# Patient Record
Sex: Female | Born: 1940
Health system: Southern US, Community
[De-identification: ages and names within clinical notes are randomized; demographics above are authoritative.]

## PROBLEM LIST (undated history)

## (undated) DIAGNOSIS — R42 Dizziness and giddiness: Secondary | ICD-10-CM

## (undated) DIAGNOSIS — K5900 Constipation, unspecified: Secondary | ICD-10-CM

## (undated) DIAGNOSIS — F319 Bipolar disorder, unspecified: Secondary | ICD-10-CM

## (undated) DIAGNOSIS — Z90710 Acquired absence of both cervix and uterus: Secondary | ICD-10-CM

## (undated) DIAGNOSIS — M8580 Other specified disorders of bone density and structure, unspecified site: Secondary | ICD-10-CM

## (undated) DIAGNOSIS — E785 Hyperlipidemia, unspecified: Secondary | ICD-10-CM

## (undated) DIAGNOSIS — T8859XA Other complications of anesthesia, initial encounter: Secondary | ICD-10-CM

## (undated) DIAGNOSIS — M5430 Sciatica, unspecified side: Secondary | ICD-10-CM

## (undated) DIAGNOSIS — F419 Anxiety disorder, unspecified: Secondary | ICD-10-CM

## (undated) DIAGNOSIS — D649 Anemia, unspecified: Secondary | ICD-10-CM

## (undated) DIAGNOSIS — T4145XA Adverse effect of unspecified anesthetic, initial encounter: Secondary | ICD-10-CM

## (undated) DIAGNOSIS — G8929 Other chronic pain: Secondary | ICD-10-CM

## (undated) DIAGNOSIS — K219 Gastro-esophageal reflux disease without esophagitis: Secondary | ICD-10-CM

## (undated) DIAGNOSIS — R5382 Chronic fatigue, unspecified: Secondary | ICD-10-CM

## (undated) DIAGNOSIS — I2510 Atherosclerotic heart disease of native coronary artery without angina pectoris: Secondary | ICD-10-CM

## (undated) DIAGNOSIS — F32A Depression, unspecified: Secondary | ICD-10-CM

## (undated) DIAGNOSIS — M549 Dorsalgia, unspecified: Secondary | ICD-10-CM

## (undated) DIAGNOSIS — Z86010 Personal history of colonic polyps: Secondary | ICD-10-CM

## (undated) DIAGNOSIS — I10 Essential (primary) hypertension: Secondary | ICD-10-CM

## (undated) DIAGNOSIS — Z87442 Personal history of urinary calculi: Secondary | ICD-10-CM

## (undated) DIAGNOSIS — Z9889 Other specified postprocedural states: Secondary | ICD-10-CM

## (undated) DIAGNOSIS — R112 Nausea with vomiting, unspecified: Secondary | ICD-10-CM

## (undated) DIAGNOSIS — F329 Major depressive disorder, single episode, unspecified: Secondary | ICD-10-CM

## (undated) DIAGNOSIS — M1990 Unspecified osteoarthritis, unspecified site: Secondary | ICD-10-CM

## (undated) DIAGNOSIS — E039 Hypothyroidism, unspecified: Secondary | ICD-10-CM

## (undated) DIAGNOSIS — H269 Unspecified cataract: Secondary | ICD-10-CM

## (undated) DIAGNOSIS — M797 Fibromyalgia: Secondary | ICD-10-CM

## (undated) DIAGNOSIS — M25569 Pain in unspecified knee: Secondary | ICD-10-CM

## (undated) HISTORY — DX: Fibromyalgia: M79.7

## (undated) HISTORY — DX: Bipolar disorder, unspecified: F31.9

## (undated) HISTORY — DX: Acquired absence of both cervix and uterus: Z90.710

## (undated) HISTORY — DX: Other specified disorders of bone density and structure, unspecified site: M85.80

## (undated) HISTORY — DX: Hyperlipidemia, unspecified: E78.5

## (undated) HISTORY — PX: BREAST LUMPECTOMY: SHX2

## (undated) HISTORY — DX: Dizziness and giddiness: R42

## (undated) HISTORY — DX: Essential (primary) hypertension: I10

## (undated) HISTORY — DX: Atherosclerotic heart disease of native coronary artery without angina pectoris: I25.10

## (undated) HISTORY — DX: Personal history of colonic polyps: Z86.010

## (undated) HISTORY — DX: Gastro-esophageal reflux disease without esophagitis: K21.9

## (undated) HISTORY — PX: EYE SURGERY: SHX253

## (undated) HISTORY — PX: JOINT REPLACEMENT: SHX530

## (undated) HISTORY — PX: BREAST SURGERY: SHX581

## (undated) HISTORY — DX: Chronic fatigue, unspecified: R53.82

## (undated) HISTORY — DX: Unspecified osteoarthritis, unspecified site: M19.90

## (undated) HISTORY — PX: SHOULDER ARTHROSCOPY: SHX128

## (undated) HISTORY — PX: SPINAL FUSION: SHX223

## (undated) HISTORY — DX: Unspecified cataract: H26.9

## (undated) HISTORY — PX: REVISION TOTAL HIP ARTHROPLASTY: SHX766

## (undated) HISTORY — PX: OTHER SURGICAL HISTORY: SHX169

## (undated) HISTORY — PX: SPINE SURGERY: SHX786

## (undated) HISTORY — PX: ESOPHAGOGASTRODUODENOSCOPY: SHX1529

## (undated) HISTORY — PX: CYSTECTOMY: SUR359

---
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
## 1987-11-13 HISTORY — PX: BREAST BIOPSY: SHX20

## 1992-11-12 HISTORY — PX: ABDOMINAL HYSTERECTOMY: SHX81

## 1998-03-04 ENCOUNTER — Other Ambulatory Visit: Admission: RE | Admit: 1998-03-04 | Discharge: 1998-03-04 | Payer: Self-pay | Admitting: Obstetrics and Gynecology

## 1999-06-07 ENCOUNTER — Other Ambulatory Visit: Admission: RE | Admit: 1999-06-07 | Discharge: 1999-06-07 | Payer: Self-pay | Admitting: Obstetrics and Gynecology

## 2000-06-12 ENCOUNTER — Other Ambulatory Visit: Admission: RE | Admit: 2000-06-12 | Discharge: 2000-06-12 | Payer: Self-pay | Admitting: Obstetrics and Gynecology

## 2001-06-12 ENCOUNTER — Other Ambulatory Visit: Admission: RE | Admit: 2001-06-12 | Discharge: 2001-06-12 | Payer: Self-pay | Admitting: Obstetrics and Gynecology

## 2001-07-22 ENCOUNTER — Encounter (HOSPITAL_COMMUNITY): Admission: RE | Admit: 2001-07-22 | Discharge: 2001-08-21 | Payer: Self-pay | Admitting: Pulmonary Disease

## 2001-08-22 ENCOUNTER — Encounter (HOSPITAL_COMMUNITY): Admission: RE | Admit: 2001-08-22 | Discharge: 2001-09-21 | Payer: Self-pay | Admitting: Pulmonary Disease

## 2002-05-04 ENCOUNTER — Encounter: Payer: Self-pay | Admitting: *Deleted

## 2002-05-04 ENCOUNTER — Emergency Department (HOSPITAL_COMMUNITY): Admission: EM | Admit: 2002-05-04 | Discharge: 2002-05-04 | Payer: Self-pay | Admitting: *Deleted

## 2002-05-08 ENCOUNTER — Ambulatory Visit (HOSPITAL_COMMUNITY): Admission: RE | Admit: 2002-05-08 | Discharge: 2002-05-08 | Payer: Self-pay | Admitting: Cardiology

## 2002-05-12 ENCOUNTER — Ambulatory Visit (HOSPITAL_COMMUNITY): Admission: RE | Admit: 2002-05-12 | Discharge: 2002-05-12 | Payer: Self-pay | Admitting: Pulmonary Disease

## 2002-07-06 ENCOUNTER — Other Ambulatory Visit: Admission: RE | Admit: 2002-07-06 | Discharge: 2002-07-06 | Payer: Self-pay | Admitting: Obstetrics and Gynecology

## 2002-09-05 ENCOUNTER — Encounter: Payer: Self-pay | Admitting: Neurosurgery

## 2002-09-05 ENCOUNTER — Ambulatory Visit (HOSPITAL_COMMUNITY): Admission: RE | Admit: 2002-09-05 | Discharge: 2002-09-05 | Payer: Self-pay | Admitting: Neurosurgery

## 2002-11-12 HISTORY — PX: SPINAL FUSION: SHX223

## 2003-06-01 ENCOUNTER — Inpatient Hospital Stay (HOSPITAL_COMMUNITY): Admission: EM | Admit: 2003-06-01 | Discharge: 2003-06-03 | Payer: Self-pay | Admitting: Emergency Medicine

## 2003-06-01 ENCOUNTER — Encounter (HOSPITAL_COMMUNITY): Admission: RE | Admit: 2003-06-01 | Discharge: 2003-07-01 | Payer: Self-pay | Admitting: *Deleted

## 2003-06-01 ENCOUNTER — Encounter: Payer: Self-pay | Admitting: *Deleted

## 2003-06-13 ENCOUNTER — Inpatient Hospital Stay (HOSPITAL_COMMUNITY): Admission: EM | Admit: 2003-06-13 | Discharge: 2003-06-14 | Payer: Self-pay | Admitting: *Deleted

## 2003-06-13 ENCOUNTER — Encounter: Payer: Self-pay | Admitting: Emergency Medicine

## 2003-06-23 ENCOUNTER — Ambulatory Visit (HOSPITAL_COMMUNITY): Admission: RE | Admit: 2003-06-23 | Discharge: 2003-06-23 | Payer: Self-pay | Admitting: Internal Medicine

## 2003-08-16 ENCOUNTER — Encounter: Payer: Self-pay | Admitting: Internal Medicine

## 2003-08-16 ENCOUNTER — Ambulatory Visit (HOSPITAL_COMMUNITY): Admission: RE | Admit: 2003-08-16 | Discharge: 2003-08-16 | Payer: Self-pay | Admitting: Internal Medicine

## 2003-10-11 ENCOUNTER — Encounter: Admission: RE | Admit: 2003-10-11 | Discharge: 2003-10-27 | Payer: Self-pay | Admitting: *Deleted

## 2003-11-13 DIAGNOSIS — Z860101 Personal history of adenomatous and serrated colon polyps: Secondary | ICD-10-CM

## 2003-11-13 DIAGNOSIS — Z86010 Personal history of colonic polyps: Secondary | ICD-10-CM

## 2003-11-13 HISTORY — DX: Personal history of adenomatous and serrated colon polyps: Z86.0101

## 2003-11-13 HISTORY — PX: BUNIONECTOMY: SHX129

## 2003-11-13 HISTORY — DX: Personal history of colonic polyps: Z86.010

## 2003-11-13 HISTORY — PX: SHOULDER ARTHROSCOPY: SHX128

## 2004-01-24 ENCOUNTER — Ambulatory Visit (HOSPITAL_COMMUNITY): Admission: RE | Admit: 2004-01-24 | Discharge: 2004-01-24 | Payer: Self-pay | Admitting: Podiatry

## 2004-07-25 ENCOUNTER — Encounter (HOSPITAL_COMMUNITY): Admission: RE | Admit: 2004-07-25 | Discharge: 2004-08-11 | Payer: Self-pay | Admitting: Orthopedic Surgery

## 2004-08-15 ENCOUNTER — Encounter (HOSPITAL_COMMUNITY): Admission: RE | Admit: 2004-08-15 | Discharge: 2004-09-14 | Payer: Self-pay | Admitting: Orthopedic Surgery

## 2004-09-26 ENCOUNTER — Encounter (HOSPITAL_COMMUNITY): Admission: RE | Admit: 2004-09-26 | Discharge: 2004-10-26 | Payer: Self-pay | Admitting: Orthopedic Surgery

## 2005-02-09 ENCOUNTER — Ambulatory Visit: Payer: Self-pay | Admitting: Internal Medicine

## 2005-08-12 HISTORY — PX: TOTAL SHOULDER REPLACEMENT: SUR1217

## 2005-10-09 ENCOUNTER — Encounter (HOSPITAL_COMMUNITY): Admission: RE | Admit: 2005-10-09 | Discharge: 2005-11-09 | Payer: Self-pay | Admitting: Orthopedic Surgery

## 2005-11-14 ENCOUNTER — Encounter (HOSPITAL_COMMUNITY): Admission: RE | Admit: 2005-11-14 | Discharge: 2005-12-11 | Payer: Self-pay | Admitting: Orthopedic Surgery

## 2005-12-14 ENCOUNTER — Encounter (HOSPITAL_COMMUNITY): Admission: RE | Admit: 2005-12-14 | Discharge: 2006-01-13 | Payer: Self-pay | Admitting: Orthopedic Surgery

## 2006-02-25 ENCOUNTER — Ambulatory Visit: Payer: Self-pay | Admitting: Internal Medicine

## 2006-06-12 HISTORY — PX: TOTAL HIP ARTHROPLASTY: SHX124

## 2006-07-25 ENCOUNTER — Encounter (HOSPITAL_COMMUNITY): Admission: RE | Admit: 2006-07-25 | Discharge: 2006-08-09 | Payer: Self-pay | Admitting: Orthopedic Surgery

## 2006-08-14 ENCOUNTER — Encounter (HOSPITAL_COMMUNITY): Admission: RE | Admit: 2006-08-14 | Discharge: 2006-09-13 | Payer: Self-pay | Admitting: Orthopedic Surgery

## 2006-09-10 ENCOUNTER — Ambulatory Visit: Payer: Self-pay | Admitting: Internal Medicine

## 2006-09-16 ENCOUNTER — Encounter (HOSPITAL_COMMUNITY): Admission: RE | Admit: 2006-09-16 | Discharge: 2006-10-16 | Payer: Self-pay | Admitting: Orthopedic Surgery

## 2007-01-29 ENCOUNTER — Ambulatory Visit: Payer: Self-pay | Admitting: Internal Medicine

## 2007-01-29 ENCOUNTER — Ambulatory Visit (HOSPITAL_COMMUNITY): Admission: RE | Admit: 2007-01-29 | Discharge: 2007-01-29 | Payer: Self-pay | Admitting: Internal Medicine

## 2007-01-29 HISTORY — PX: COLONOSCOPY: SHX174

## 2007-02-24 ENCOUNTER — Ambulatory Visit: Payer: Self-pay | Admitting: Internal Medicine

## 2007-03-05 ENCOUNTER — Ambulatory Visit: Payer: Self-pay

## 2007-03-05 LAB — CONVERTED CEMR LAB
ALT: 29 units/L (ref 0–40)
AST: 29 units/L (ref 0–37)
Cholesterol: 142 mg/dL (ref 0–200)
HDL: 52.8 mg/dL (ref >39.0)
LDL Cholesterol: 69 mg/dL (ref 0–99)
Total CHOL/HDL Ratio: 2.7
Total CK: 132 units/L (ref 7–177)
Triglycerides: 101 mg/dL (ref 0–149)
VLDL: 20 mg/dL (ref 0–40)

## 2007-03-12 ENCOUNTER — Encounter (HOSPITAL_COMMUNITY): Admission: RE | Admit: 2007-03-12 | Discharge: 2007-04-11 | Payer: Self-pay | Admitting: Orthopedic Surgery

## 2007-05-08 ENCOUNTER — Ambulatory Visit: Payer: Self-pay | Admitting: Internal Medicine

## 2007-05-09 ENCOUNTER — Ambulatory Visit (HOSPITAL_COMMUNITY): Admission: RE | Admit: 2007-05-09 | Discharge: 2007-05-09 | Payer: Self-pay | Admitting: Pulmonary Disease

## 2007-09-15 ENCOUNTER — Emergency Department (HOSPITAL_COMMUNITY): Admission: EM | Admit: 2007-09-15 | Discharge: 2007-09-16 | Payer: Self-pay | Admitting: Psychiatry

## 2007-09-26 ENCOUNTER — Ambulatory Visit: Payer: Self-pay | Admitting: Internal Medicine

## 2007-10-22 ENCOUNTER — Ambulatory Visit (HOSPITAL_COMMUNITY): Admission: RE | Admit: 2007-10-22 | Discharge: 2007-10-22 | Payer: Self-pay | Admitting: Pulmonary Disease

## 2007-12-04 ENCOUNTER — Ambulatory Visit: Payer: Self-pay | Admitting: Internal Medicine

## 2007-12-30 ENCOUNTER — Ambulatory Visit: Payer: Self-pay | Admitting: Internal Medicine

## 2008-01-23 ENCOUNTER — Encounter (HOSPITAL_COMMUNITY): Admission: RE | Admit: 2008-01-23 | Discharge: 2008-02-22 | Payer: Self-pay | Admitting: Orthopedic Surgery

## 2008-02-24 ENCOUNTER — Ambulatory Visit: Admission: RE | Admit: 2008-02-24 | Discharge: 2008-02-24 | Payer: Self-pay | Admitting: Orthopedic Surgery

## 2008-07-05 ENCOUNTER — Ambulatory Visit: Payer: Self-pay | Admitting: Internal Medicine

## 2008-09-14 ENCOUNTER — Encounter (HOSPITAL_COMMUNITY): Admission: RE | Admit: 2008-09-14 | Discharge: 2008-10-14 | Payer: Self-pay | Admitting: Orthopedic Surgery

## 2008-10-20 ENCOUNTER — Encounter (HOSPITAL_COMMUNITY): Admission: RE | Admit: 2008-10-20 | Discharge: 2008-11-19 | Payer: Self-pay | Admitting: Orthopedic Surgery

## 2008-11-12 HISTORY — PX: CARDIAC CATHETERIZATION: SHX172

## 2008-12-24 ENCOUNTER — Ambulatory Visit: Payer: Self-pay | Admitting: Internal Medicine

## 2008-12-24 ENCOUNTER — Encounter: Payer: Self-pay | Admitting: Cardiology

## 2008-12-24 ENCOUNTER — Inpatient Hospital Stay (HOSPITAL_COMMUNITY): Admission: EM | Admit: 2008-12-24 | Discharge: 2008-12-28 | Payer: Self-pay | Admitting: Cardiology

## 2009-01-20 ENCOUNTER — Ambulatory Visit: Payer: Self-pay | Admitting: Internal Medicine

## 2009-02-02 ENCOUNTER — Encounter (HOSPITAL_COMMUNITY): Admission: RE | Admit: 2009-02-02 | Discharge: 2009-03-04 | Payer: Self-pay | Admitting: Internal Medicine

## 2009-02-23 ENCOUNTER — Encounter: Payer: Self-pay | Admitting: Internal Medicine

## 2009-03-07 ENCOUNTER — Encounter (HOSPITAL_COMMUNITY): Admission: RE | Admit: 2009-03-07 | Discharge: 2009-04-06 | Payer: Self-pay | Admitting: Internal Medicine

## 2009-03-16 ENCOUNTER — Telehealth: Payer: Self-pay | Admitting: Internal Medicine

## 2009-03-16 ENCOUNTER — Encounter: Payer: Self-pay | Admitting: Internal Medicine

## 2009-03-23 ENCOUNTER — Encounter: Payer: Self-pay | Admitting: Internal Medicine

## 2009-04-06 ENCOUNTER — Encounter (HOSPITAL_COMMUNITY): Admission: RE | Admit: 2009-04-06 | Discharge: 2009-05-06 | Payer: Self-pay | Admitting: Internal Medicine

## 2009-05-09 ENCOUNTER — Encounter (HOSPITAL_COMMUNITY): Admission: RE | Admit: 2009-05-09 | Discharge: 2009-05-13 | Payer: Self-pay | Admitting: Internal Medicine

## 2009-05-11 DIAGNOSIS — R42 Dizziness and giddiness: Secondary | ICD-10-CM | POA: Insufficient documentation

## 2009-05-11 DIAGNOSIS — I2510 Atherosclerotic heart disease of native coronary artery without angina pectoris: Secondary | ICD-10-CM | POA: Insufficient documentation

## 2009-05-11 DIAGNOSIS — F319 Bipolar disorder, unspecified: Secondary | ICD-10-CM

## 2009-05-11 DIAGNOSIS — M797 Fibromyalgia: Secondary | ICD-10-CM | POA: Insufficient documentation

## 2009-05-11 DIAGNOSIS — R079 Chest pain, unspecified: Secondary | ICD-10-CM | POA: Insufficient documentation

## 2009-05-11 DIAGNOSIS — K219 Gastro-esophageal reflux disease without esophagitis: Secondary | ICD-10-CM | POA: Insufficient documentation

## 2009-05-11 DIAGNOSIS — E785 Hyperlipidemia, unspecified: Secondary | ICD-10-CM | POA: Insufficient documentation

## 2009-05-11 HISTORY — DX: Chest pain, unspecified: R07.9

## 2009-05-11 HISTORY — DX: Bipolar disorder, unspecified: F31.9

## 2009-05-12 ENCOUNTER — Ambulatory Visit: Payer: Self-pay | Admitting: Internal Medicine

## 2009-05-18 ENCOUNTER — Encounter (HOSPITAL_COMMUNITY): Admission: RE | Admit: 2009-05-18 | Discharge: 2009-06-17 | Payer: Self-pay | Admitting: Internal Medicine

## 2009-06-15 ENCOUNTER — Encounter: Payer: Self-pay | Admitting: Internal Medicine

## 2009-11-21 ENCOUNTER — Ambulatory Visit: Payer: Self-pay | Admitting: Internal Medicine

## 2009-11-23 LAB — CONVERTED CEMR LAB
AST: 30 units/L (ref 0–37)
BUN: 7 mg/dL (ref 6–23)
CO2: 31 meq/L (ref 19–32)
Calcium: 9.5 mg/dL (ref 8.4–10.5)
Chloride: 103 meq/L (ref 96–112)
Creatinine, Ser: 0.8 mg/dL (ref 0.4–1.2)
GFR calc non Af Amer: 76.26 mL/min (ref >60)
Glucose, Bld: 102 mg/dL — ABNORMAL HIGH (ref 70–99)
Potassium: 4.1 meq/L (ref 3.5–5.1)
Sodium: 140 meq/L (ref 135–145)

## 2010-09-19 ENCOUNTER — Ambulatory Visit (HOSPITAL_COMMUNITY)
Admission: RE | Admit: 2010-09-19 | Discharge: 2010-09-19 | Payer: Self-pay | Source: Home / Self Care | Admitting: Ophthalmology

## 2010-09-29 ENCOUNTER — Ambulatory Visit: Payer: Self-pay | Admitting: Internal Medicine

## 2010-11-07 ENCOUNTER — Encounter: Payer: Self-pay | Admitting: Internal Medicine

## 2010-11-14 ENCOUNTER — Encounter: Payer: Self-pay | Admitting: Internal Medicine

## 2010-12-12 NOTE — Progress Notes (Signed)
Summary: cardiac rehab note  Medications Added NITROGLYCERIN 0.4 MG SUBL (NITROGLYCERIN) Place 1 tablet under tongue as directed VYTORIN 10-20 MG TABS (EZETIMIBE-SIMVASTATIN) 1 tablet by mouth at bedtime      Allergies Added: VICODIN (HYDROCODONE-ACETAMINOPHEN) Phone Note From Other Clinic   Caller: Diane Coad--[facility] Summary of Call: Received call from Hart Rochester at Cardiac Rehab in [place] this AM. States pt has had a trend at cardiac rehab of elevated heart rate and blood pressure when exercising. States pt gets SOB easily but no other complaints. She wanted to let Jackie/Dr. Tenny Craw know. Also will fax strips from rehab to [place]. Will forward information to [facility] to review with Dr. Tenny Craw Initial call taken by: Dossie Arbour, RN, BSN,  Mar 16, 2009 3:07 PM  Follow-up for Phone Call        No changes to be made..Dr. Tenny Craw reviewed strips.. faxed note to rehab.   New Allergies: VICODIN (HYDROCODONE-ACETAMINOPHEN) New/Updated Medications: NITROGLYCERIN 0.4 MG SUBL (NITROGLYCERIN) Place 1 tablet under tongue as directed VYTORIN 10-20 MG TABS (EZETIMIBE-SIMVASTATIN) 1 tablet by mouth at bedtime New Allergies: VICODIN (HYDROCODONE-ACETAMINOPHEN)

## 2010-12-12 NOTE — Assessment & Plan Note (Signed)
Summary: ROV/ GD   History of Present Illness: [name] is a 70 year old woman. She has a history of CAD. I last saw her in March. She underwent cardiac catheterization back in February.  Tthis showed normal LV function, 20-30% proximal RCA, 50-60% ostial LAD. Overall, this was better than catheterization 6 years prior. Plan was for medical therapy and also cardiac rehabilitation.  The patient is going to cardiac rehabilitation at [facility] 3 days a week on review of her is reports from there. She seems to be doing okay. She says she spends about 10-15 minutes exercising 15 minutes on a treadmill 20 minutes on the stair step. She does think she is feeliing this. Denies significant shortness of breath.  Does note occasional dizziness. No presyncope or syncope. Her heart rates have been up while on the stair stepping treadmill. They told her slow down a little. Overall on review the reports this does not seem to be limiting.  Peak HR in 130s.   She denies chest pain.  she does say she is not exercising on the other days  Problems Prior to Update: 1)  Cad, Unspecified Site  (ICD-414.00) 2)  Chest Pain-unspecified  (ICD-786.50) 3)  Dyslipidemia  (ICD-272.4) 4)  Dizziness  (ICD-780.4) 5)  Gerd  (ICD-530.81) 6)  Bipolar Affective Disorder  (ICD-296.80) 7)  Fibromyalgia  (ICD-729.1)  Medications Prior to Update: 1)  Nitroglycerin 0.4 Mg Subl (Nitroglycerin) .... Place 1 Tablet Under Tongue As Directed 2)  Vytorin 10-20 Mg Tabs (Ezetimibe-Simvastatin) .Marland Kitchen.. 1 Tablet By Mouth At Bedtime  Current Medications (verified): 1)  Nitroglycerin 0.4 Mg Subl (Nitroglycerin) .... Place 1 Tablet Under Tongue As Directed 2)  Vytorin 10-20 Mg Tabs (Ezetimibe-Simvastatin) .Marland Kitchen.. 1 Tablet By Mouth At Bedtime 3)  Multivitamins   Tabs (Multiple Vitamin) .... Once Daily 4)  Magnesium Oxide 500 Mg Tabs (Magnesium Oxide) .... Once Daily 5)  Omeprazole 40 Mg Cpdr (Omeprazole) .... Two Times A Day 6)   Cymbalta 60 Mg Cpep (Duloxetine Hcl) .... 2 Tabs in The Morning 7)  Oxybutynin Chloride 10 Mg Xr24h-Tab (Oxybutynin Chloride) .... Once Daily 8)  Fish Oil   Oil (Fish Oil) .... 1000mg  Daily 9)  Vitamin D (Ergocalciferol) 50000 Unit Caps (Ergocalciferol) .... Every Other Week 10)  Mobic 7.5 Mg Tabs (Meloxicam) .... Two Times A Day 11)  Vitamin C 500 Mg  Tabs (Ascorbic Acid) .... Once Daily 12)  Slo-Niacin 500 Mg Cr-Tabs (Niacin) .... Once Daily 13)  Risperdal 0.25 Mg Tabs (Risperidone) .... Once Daily 14)  Estradiol 1 Mg Tabs (Estradiol) .... Once Daily 15)  Aplenzin 522 Mg Xr24h-Tab (Bupropion Hbr) .... Once Daily 16)  Valtrex 1 Gm Tabs (Valacyclovir Hcl) .... Three Times A Day 17)  Calcium Carbonate-Vitamin D 600-400 Mg-Unit  Tabs (Calcium Carbonate-Vitamin D) .... Once Daily 18)  Fibercon 625 Mg  Tabs (Calcium Polycarbophil) .... Once Daily  Allergies (verified): 1)  Vicodin (Hydrocodone-Acetaminophen)  Past History:  Past Medical History: Last updated: 05/11/2009 CAD, UNSPECIFIED SITE (ICD-414.00) CHEST PAIN-UNSPECIFIED (ICD-786.50) DYSLIPIDEMIA (ICD-272.4) DIZZINESS (ICD-780.4) GERD (ICD-530.81) BIPOLAR AFFECTIVE DISORDER (ICD-296.80) FIBROMYALGIA (ICD-729.1)      Past Surgical History: Last updated: 05/11/2009 Colonoscopy... 01/29/2007 [place] bunionectomy left foot. SURGEON:  Oley Balm. Pricilla Holm, D.P.M...01/24/2004 Diagnostic esophagogastroduodenoscopy followed by colonoscopy with snare polypectomy. ENDOSCOPIST:  Gerrit Friends. Rourk, M.D.Marland Kitchen. 06/23/2003 Hysterectomy Cyst Lumpectomy.  Family History: Last updated: 05/11/2009  Unremarkable for any early coronary disease.   Social History: Last updated: 05/11/2009 Married  Tobacco Use - No.  Unemployed  Review of Systems       all systems reviewed. Negative for the above problem except as noted above. Is trying to lose weight. She is 133, now optimally would like to be 120 has oatmeal fruit yogurt for breakfast, wraps for  lunch and dinner.  Vital Signs:  Patient profile:   70 year old female Height:      58 inches Weight:      133 pounds BMI:     27.90 Pulse rate:   69 / minute BP sitting:   118 / 64  (left arm) Cuff size:   regular  Vitals Entered By: Hardin Negus, RMA (May 12, 2009 8:59 AM)  Physical Exam  Additional Exam:  HEENT:  Normocephalic, atraumatic. EOMI, PERRLA.  Neck: JVP is normal. No thyromegaly. No bruits.  Lungs: clear to auscultation. No rales no wheezes.  Heart: Regular rate and rhythm. Normal S1, S2. No S3.   No significant murmurs. PMI not displaced.  Abdomen:  Supple, nontender. Normal bowel sounds. No masses. No hepatomegaly.  Extremities:   Good distal pulses throughout. No lower extremity edema.  Musculoskeletal :moving all extremities.  Neuro:   alert and oriented x3.    EKG  Procedure date:  05/12/2009  Findings:      NSR. 69 bpm  Impression & Recommendations:  Problem # 1:  CAD, UNSPECIFIED SITE (ICD-414.00) no signs of active angina. I think in listening to her breathing is getting some better with physical training. I encouraged her to increase her activity to 5 days per week and try to go to a wide to do this. Regimen. Her updated medication list for this problem includes:    Nitroglycerin 0.4 Mg Subl (Nitroglycerin) .Marland Kitchen... Place 1 tablet under tongue as directed  Orders: EKG w/ Interpretation (93000)  Problem # 2:  DYSLIPIDEMIA (ICD-272.4) keep on same regimen. Will need followup. Her updated medication list for this problem includes:    Vytorin 10-20 Mg Tabs (Ezetimibe-simvastatin) .Marland Kitchen... 1 tablet by mouth at bedtime    Slo-niacin 500 Mg Cr-tabs (Niacin) ..... Once daily  Problem # 3:  DIZZINESS (ICD-780.4) occasional will follow. Orders: EKG w/ Interpretation (93000)  Patient Instructions: 1)  try to increase activity to [place] at least 5 days per week. Watch diet continue current regimen. Followup in 6 months.

## 2010-12-12 NOTE — Assessment & Plan Note (Signed)
Summary: 6 mo f/u   Visit Type:  Follow-up Primary Provider:  ed hawkins  CC:  weight gain.  History of Present Illness: Ms.  Leiphart is a 70 year old with a history of CAD, dislipidemia.  I last saw her in July of 2010 Since seen, she denies CP.  Breathing is OK.  Her activity is down some because of family issues (grandchild had surgery).  Leodis Binet is also up because of this.  Current Medications (verified): 1)  Nitroglycerin 0.4 Mg Subl (Nitroglycerin) .... Place 1 Tablet Under Tongue As Directed 2)  Multivitamins   Tabs (Multiple Vitamin) .... Once Daily 3)  Magnesium Oxide 500 Mg Tabs (Magnesium Oxide) .... Once Daily 4)  Omeprazole 40 Mg Cpdr (Omeprazole) .... Two Times A Day 5)  Cymbalta 60 Mg Cpep (Duloxetine Hcl) .Marland Kitchen.. 1 Tab in The Morning 6)  Fish Oil   Oil (Fish Oil) .... 1000mg  Daily 7)  Vitamin D (Ergocalciferol) 50000 Unit Caps (Ergocalciferol) .... Every Other Week 8)  Mobic 7.5 Mg Tabs (Meloxicam) .... Two Times A Day 9)  Vitamin C 500 Mg  Tabs (Ascorbic Acid) .... Once Daily 10)  Slo-Niacin 500 Mg Cr-Tabs (Niacin) .... Once Daily 11)  Risperdal 1 Mg Tabs (Risperidone) .Marland Kitchen.. 1 Tab Once Daily 12)  Estradiol 1 Mg Tabs (Estradiol) .... Once Daily 13)  Aplenzin 348 Mg Xr24h-Tab (Bupropion Hbr) .Marland Kitchen.. 1 Tab Once Daily 14)  Valtrex 1 Gm Tabs (Valacyclovir Hcl) .... Three Times A Day 15)  Calcium Carbonate-Vitamin D 600-400 Mg-Unit  Tabs (Calcium Carbonate-Vitamin D) .... Once Daily 16)  Fibercon 625 Mg  Tabs (Calcium Polycarbophil) .... Once Daily As Needed 17)  Simvastatin 40 Mg Tabs (Simvastatin) .... Take One Tablet By Mouth Daily At Bedtime  Allergies (verified): 1)  Vicodin (Hydrocodone-Acetaminophen)  Past History:  Past Medical History: Last updated: 05/11/2009 CAD, UNSPECIFIED SITE (ICD-414.00) CHEST PAIN-UNSPECIFIED (ICD-786.50) DYSLIPIDEMIA (ICD-272.4) DIZZINESS (ICD-780.4) GERD (ICD-530.81) BIPOLAR AFFECTIVE DISORDER (ICD-296.80) FIBROMYALGIA (ICD-729.1)        Past Surgical History: Last updated: 05/11/2009 Colonoscopy... 01/29/2007 [place] bunionectomy left foot. SURGEON:  Oley Balm. Pricilla Holm, D.P.M...01/24/2004 Diagnostic esophagogastroduodenoscopy followed by colonoscopy with snare polypectomy. ENDOSCOPIST:  Gerrit Friends. Rourk, M.D.Marland Kitchen. 06/23/2003 Hysterectomy Cyst Lumpectomy.  Family History: Last updated: 05/11/2009  Unremarkable for any early coronary disease.   Social History: Last updated: 05/11/2009 Married  Tobacco Use - No.  Unemployed  Review of Systems       NO SOB.  No chest pain.  All systems reviewed.  Negative to the above problem except as noted  Vital Signs:  Patient profile:   70 year old female Height:      58 inches Weight:      141 pounds BMI:     29.58 Pulse rate:   74 / minute BP sitting:   137 / 80  (left arm) Cuff size:   regular  Vitals Entered By: Burnett Kanaris, CNA (November 21, 2009 9:02 AM)  Physical Exam  Additional Exam:  HEENT:  Normocephalic, atraumatic. EOMI, PERRLA.  Neck: JVP is normal. No thyromegaly. No bruits.  Lungs: clear to auscultation. No rales no wheezes.  Heart: Regular rate and rhythm. Normal S1, S2. No S3.   No significant murmurs. PMI not displaced.  Abdomen:  Supple, nontender. Normal bowel sounds. No masses. No hepatomegaly.  Extremities:   Good distal pulses throughout. No lower extremity edema.  Musculoskeletal :moving all extremities.  Neuro:   alert and oriented x3.    EKG  Procedure date:  11/21/2009  Findings:      NSR.  79 bpm.  Impression & Recommendations:  Problem # 1:  CAD, UNSPECIFIED SITE (ICD-414.00) Stable.  Would continue current meds.  Encouraged her to increase her activity. Her updated medication list for this problem includes:    Nitroglycerin 0.4 Mg Subl (Nitroglycerin) .Marland Kitchen... Place 1 tablet under tongue as directed  Orders: EKG w/ Interpretation (93000) TLB-BMP (Basic Metabolic Panel-BMET) (80048-METABOL) TLB-AST (SGOT)  (84450-SGOT)  Problem # 2:  DYSLIPIDEMIA (ICD-272.4) Keep on current meds. The following medications were removed from the medication list:    Vytorin 10-20 Mg Tabs (Ezetimibe-simvastatin) .Marland Kitchen... 1 tablet by mouth at bedtime Her updated medication list for this problem includes:    Slo-niacin 500 Mg Cr-tabs (Niacin) ..... Once daily    Simvastatin 40 Mg Tabs (Simvastatin) .Marland Kitchen... Take one tablet by mouth daily at bedtime  Problem # 3:  DIZZINESS (ICD-780.4) Denies.  Patient Instructions: 1)  Your physician recommends that you schedule a follow-up appointment in: 10 months. You will receive a reminder 2 months prior appointment date. 2)  Your physician recommends that you return for lab work AT:[facility] BMET and  AST. Prescriptions: NITROGLYCERIN 0.4 MG SUBL (NITROGLYCERIN) Place 1 tablet under tongue as directed  #25 x 3   Entered by:   Ollen Gross, RN, BSN   Authorized by:   Sherrill Raring, MD, [facility]   Signed by:   Ollen Gross, RN, BSN on 11/21/2009   Method used:   Electronically to        [facility] (retail)       [address]       [place], [place]  [address]       Ph: [phone]       Fax: [phone]   RxID:   [id number]

## 2010-12-12 NOTE — Assessment & Plan Note (Addendum)
Summary: rov/sl   Visit Type:  rov Primary Provider:  Shaune Pollack  CC:  shortness of breath and headaches.  History of Present Illness: [name] [name] is a 70 year old woman. She has a history of CAD. I last saw her in January. She underwent cardiac catheterization back in February 2010    Tthis showed normal LV function, 20-30% proximal RCA, 50-60% ostial LAD. Overall, this was better than catheterization 6 years prior.  Since I saw her last winte she has done well.  NO chest pain  No SOB.  Her biggest problem now is her vision . Had cataract surgery and YAG after  Vision is still blurred. She admits to not working out as much as she should  Belongs to [employer]  Current Medications (verified): 1)  Nitroglycerin 0.4 Mg Subl (Nitroglycerin) .... Place 1 Tablet Under Tongue As Directed 2)  Multivitamins   Tabs (Multiple Vitamin) .... Once Daily 3)  Magnesium Oxide 500 Mg Tabs (Magnesium Oxide) .... Once Daily 4)  Omeprazole 40 Mg Cpdr (Omeprazole) .... Once Daily 5)  Cymbalta 60 Mg Cpep (Duloxetine Hcl) .... 2 Tab in The Morning 6)  Fish Oil   Oil (Fish Oil) .... 1000mg  Daily 7)  Vitamin D (Ergocalciferol) 50000 Unit Caps (Ergocalciferol) .... Every Other Week 8)  Mobic 7.5 Mg Tabs (Meloxicam) .... Two Times A Day 9)  Vitamin C 500 Mg  Tabs (Ascorbic Acid) .... Once Daily 10)  Slo-Niacin 500 Mg Cr-Tabs (Niacin) .... Once Daily 11)  Risperdal 1 Mg Tabs (Risperidone) .Marland Kitchen.. 1 Tab Once Daily 12)  Estradiol 1 Mg Tabs (Estradiol) .... Once Daily 13)  Aplenzin 522 Mg Xr24h-Tab (Bupropion Hbr) .... Once Daily 14)  Calcium Carbonate-Vitamin D 600-400 Mg-Unit  Tabs (Calcium Carbonate-Vitamin D) .... Once Daily 15)  Fibercon 625 Mg  Tabs (Calcium Polycarbophil) .... Once Daily As Needed 16)  Simvastatin 40 Mg Tabs (Simvastatin) .... Take One Tablet By Mouth Daily At Bedtime  Allergies (verified): 1)  Vicodin (Hydrocodone-Acetaminophen)  Past History:  Past medical, surgical, family and social  histories (including risk factors) reviewed, and no changes noted (except as noted below).  Past Medical History: Reviewed history from 05/11/2009 and no changes required. CAD, UNSPECIFIED SITE (ICD-414.00) CHEST PAIN-UNSPECIFIED (ICD-786.50) DYSLIPIDEMIA (ICD-272.4) DIZZINESS (ICD-780.4) GERD (ICD-530.81) BIPOLAR AFFECTIVE DISORDER (ICD-296.80) FIBROMYALGIA (ICD-729.1)      Past Surgical History: Reviewed history from 05/11/2009 and no changes required. Colonoscopy... 01/29/2007 [place] bunionectomy left foot. SURGEON:  Oley Balm. Pricilla Holm, D.P.M...01/24/2004 Diagnostic esophagogastroduodenoscopy followed by colonoscopy with snare polypectomy. ENDOSCOPIST:  Gerrit Friends. Rourk, M.D.Marland Kitchen. 06/23/2003 Hysterectomy Cyst Lumpectomy.  Family History: Reviewed history from 05/11/2009 and no changes required.  Unremarkable for any early coronary disease.   Social History: Reviewed history from 05/11/2009 and no changes required. Married  Tobacco Use - No.  Unemployed  Review of Systems       All systems reviewed.  Neg to the above problem.  Vital Signs:  Patient profile:   70 year old female Height:      58 inches Weight:      135.25 pounds BMI:     28.37 Pulse rate:   85 / minute BP sitting:   140 / 88  (left arm) Cuff size:   regular  Vitals Entered By: Caralee Ates CMA (September 29, 2010 11:22 AM)  Physical Exam  Additional Exam:  patient is in NAD HEENT:  Normocephalic, atraumatic. EOMI, PERRLA.  Neck: JVP is normal. No thyromegaly. No bruits.  Lungs: clear to auscultation. No rales no wheezes.  Heart: Regular rate and rhythm. Normal S1, S2. No S3.   No significant murmurs. PMI not displaced.  Abdomen:  Supple, nontender. Normal bowel sounds. No masses. No hepatomegaly.  Extremities:   Good distal pulses throughout. No lower extremity edema.  Musculoskeletal :moving all extremities.  Neuro:   alert and oriented x3.   BP on my check is 134/82.   Impression &  Recommendations:  Problem # 1:  CAD, UNSPECIFIED SITE (ICD-414.00) Doing well  No sympt to sugg ischemia.  Continue to follow  On ASA Her updated medication list for this problem includes:    Nitroglycerin 0.4 Mg Subl (Nitroglycerin) .Marland Kitchen... Place 1 tablet under tongue as directed  Problem # 2:  DYSLIPIDEMIA (ICD-272.4) Patient had ques re simvistatin.  Wants to know if sh can go on lipitor.  Will review  lipids from Dr Juanetta Gosling office and get back with her. Her updated medication list for this problem includes:    Slo-niacin 500 Mg Cr-tabs (Niacin) ..... Once daily    Simvastatin 40 Mg Tabs (Simvastatin) .Marland Kitchen... Take one tablet by mouth daily at bedtime  Patient Instructions: 1)  Your physician wants you to follow-up in:September 2012  You will receive a reminder letter in the mail two months in advance. If you don't receive a letter, please call our office to schedule the follow-up appointment.  Appended Document: rov/sl LMOM for call back to change Simvastatin to Lipitor 40mg  per Dr.Jonquil Stubbe and then check lipid and ast in 8 to 10 weeks.  Appended Document: rov/sl Called patient again. She advised me that she had repeat labs done at Dr.Hawkins' office at the end of December 2012 and was told to stay on Simvastatin 40mg . She was OK for [place] to get a copy of this and share with Dr.Perez Dirico.   Appended Document: rov/sl Keep on same regimen.  LDL 94.  HDL excellent at 74.

## 2010-12-12 NOTE — Miscellaneous (Signed)
Summary: [facility]  [facility]   Imported By: Roderic Ovens 06/29/2009 16:34:43  _____________________________________________________________________  External Attachment:    Type:   Image     Comment:   External Document

## 2011-02-07 ENCOUNTER — Other Ambulatory Visit: Payer: Self-pay | Admitting: Internal Medicine

## 2011-02-08 NOTE — Telephone Encounter (Signed)
[address] °

## 2011-02-13 ENCOUNTER — Ambulatory Visit (HOSPITAL_COMMUNITY)
Admission: RE | Admit: 2011-02-13 | Discharge: 2011-02-13 | Disposition: A | Discharge status: Home / Self Care | Payer: Medicare Other | Source: Ambulatory Visit | Admitting: Specialist

## 2011-02-13 ENCOUNTER — Ambulatory Visit (HOSPITAL_COMMUNITY)
Admission: RE | Admit: 2011-02-13 | Discharge: 2011-02-13 | Disposition: A | Discharge status: Home / Self Care | Payer: Medicare Other | Source: Ambulatory Visit | Attending: Orthopedic Surgery | Admitting: Orthopedic Surgery

## 2011-02-13 DIAGNOSIS — M25619 Stiffness of unspecified shoulder, not elsewhere classified: Secondary | ICD-10-CM | POA: Insufficient documentation

## 2011-02-13 DIAGNOSIS — M25519 Pain in unspecified shoulder: Secondary | ICD-10-CM | POA: Insufficient documentation

## 2011-02-13 DIAGNOSIS — M6281 Muscle weakness (generalized): Secondary | ICD-10-CM | POA: Insufficient documentation

## 2011-02-13 DIAGNOSIS — M25559 Pain in unspecified hip: Secondary | ICD-10-CM | POA: Insufficient documentation

## 2011-02-13 DIAGNOSIS — Z5189 Encounter for other specified aftercare: Secondary | ICD-10-CM | POA: Insufficient documentation

## 2011-02-13 DIAGNOSIS — M25649 Stiffness of unspecified hand, not elsewhere classified: Secondary | ICD-10-CM | POA: Insufficient documentation

## 2011-02-20 ENCOUNTER — Ambulatory Visit (HOSPITAL_COMMUNITY)
Admission: RE | Admit: 2011-02-20 | Discharge: 2011-02-20 | Disposition: A | Discharge status: Home / Self Care | Payer: Medicare Other | Source: Ambulatory Visit | Attending: Orthopedic Surgery | Admitting: Orthopedic Surgery

## 2011-02-20 DIAGNOSIS — M25519 Pain in unspecified shoulder: Secondary | ICD-10-CM | POA: Insufficient documentation

## 2011-02-20 DIAGNOSIS — M25649 Stiffness of unspecified hand, not elsewhere classified: Secondary | ICD-10-CM | POA: Insufficient documentation

## 2011-02-20 DIAGNOSIS — M25619 Stiffness of unspecified shoulder, not elsewhere classified: Secondary | ICD-10-CM | POA: Insufficient documentation

## 2011-02-20 DIAGNOSIS — M25559 Pain in unspecified hip: Secondary | ICD-10-CM | POA: Insufficient documentation

## 2011-02-20 DIAGNOSIS — IMO0001 Reserved for inherently not codable concepts without codable children: Secondary | ICD-10-CM | POA: Insufficient documentation

## 2011-02-20 DIAGNOSIS — M6281 Muscle weakness (generalized): Secondary | ICD-10-CM | POA: Insufficient documentation

## 2011-02-23 ENCOUNTER — Ambulatory Visit (HOSPITAL_COMMUNITY)
Admission: RE | Admit: 2011-02-23 | Discharge: 2011-02-23 | Disposition: A | Discharge status: Home / Self Care | Payer: Medicare Other | Source: Ambulatory Visit | Admitting: Physical Therapy

## 2011-02-27 ENCOUNTER — Ambulatory Visit (HOSPITAL_COMMUNITY)
Admission: RE | Admit: 2011-02-27 | Discharge: 2011-02-27 | Disposition: A | Discharge status: Home / Self Care | Payer: Medicare Other | Source: Ambulatory Visit | Attending: Pulmonary Disease | Admitting: Pulmonary Disease

## 2011-02-27 LAB — POCT CARDIAC MARKERS
CKMB, poc: 1.3 ng/mL (ref 1.0–8.0)
CKMB, poc: <1 ng/mL — ABNORMAL LOW (ref 1.0–8.0)
Myoglobin, poc: 47.2 ng/mL (ref 12–200)
Myoglobin, poc: 76.8 ng/mL (ref 12–200)
Troponin i, poc: <0.05 ng/mL (ref 0.00–0.09)
Troponin i, poc: <0.05 ng/mL (ref 0.00–0.09)

## 2011-02-27 LAB — DIFFERENTIAL
Basophils Absolute: 0.1 10*3/uL (ref 0.0–0.1)
Basophils Absolute: 0 10*3/uL (ref 0.0–0.1)
Basophils Relative: 1 % (ref 0–1)
Basophils Relative: 1 % (ref 0–1)
Eosinophils Absolute: 0.3 10*3/uL (ref 0.0–0.7)
Eosinophils Absolute: 0.5 10*3/uL (ref 0.0–0.7)
Eosinophils Relative: 4 % (ref 0–5)
Eosinophils Relative: 5 % (ref 0–5)
Lymphocytes Relative: 18 % (ref 12–46)
Lymphocytes Relative: 25 % (ref 12–46)
Lymphs Abs: 1.2 10*3/uL (ref 0.7–4.0)
Lymphs Abs: 2.3 10*3/uL (ref 0.7–4.0)
Monocytes Absolute: 0.7 10*3/uL (ref 0.1–1.0)
Monocytes Absolute: 0.9 10*3/uL (ref 0.1–1.0)
Monocytes Relative: 11 % (ref 3–12)
Monocytes Relative: 10 % (ref 3–12)
Neutro Abs: 4.4 10*3/uL (ref 1.7–7.7)
Neutro Abs: 5.7 10*3/uL (ref 1.7–7.7)
Neutrophils Relative %: 66 % (ref 43–77)
Neutrophils Relative %: 60 % (ref 43–77)

## 2011-02-27 LAB — CBC
HCT: 34.8 % — ABNORMAL LOW (ref 36.0–46.0)
HCT: 35.2 % — ABNORMAL LOW (ref 36.0–46.0)
HCT: 35.5 % — ABNORMAL LOW (ref 36.0–46.0)
HCT: 39.7 % (ref 36.0–46.0)
HCT: 34.7 % — ABNORMAL LOW (ref 36.0–46.0)
Hemoglobin: 12.3 g/dL (ref 12.0–15.0)
Hemoglobin: 12.4 g/dL (ref 12.0–15.0)
Hemoglobin: 12.4 g/dL (ref 12.0–15.0)
Hemoglobin: 14.1 g/dL (ref 12.0–15.0)
Hemoglobin: 12.2 g/dL (ref 12.0–15.0)
MCHC: 34.8 g/dL (ref 30.0–36.0)
MCHC: 35.2 g/dL (ref 30.0–36.0)
MCHC: 35.3 g/dL (ref 30.0–36.0)
MCHC: 35.5 g/dL (ref 30.0–36.0)
MCHC: 35.1 g/dL (ref 30.0–36.0)
MCV: 103.3 fL — ABNORMAL HIGH (ref 78.0–100.0)
MCV: 104 fL — ABNORMAL HIGH (ref 78.0–100.0)
MCV: 105.2 fL — ABNORMAL HIGH (ref 78.0–100.0)
MCV: 104.1 fL — ABNORMAL HIGH (ref 78.0–100.0)
MCV: 102.7 fL — ABNORMAL HIGH (ref 78.0–100.0)
Platelets: 185 10*3/uL (ref 150–400)
Platelets: 191 10*3/uL (ref 150–400)
Platelets: 196 10*3/uL (ref 150–400)
Platelets: 229 10*3/uL (ref 150–400)
Platelets: 177 10*3/uL (ref 150–400)
RBC: 3.37 MIL/uL — ABNORMAL LOW (ref 3.87–5.11)
RBC: 3.37 MIL/uL — ABNORMAL LOW (ref 3.87–5.11)
RBC: 3.38 MIL/uL — ABNORMAL LOW (ref 3.87–5.11)
RBC: 3.81 MIL/uL — ABNORMAL LOW (ref 3.87–5.11)
RBC: 3.38 MIL/uL — ABNORMAL LOW (ref 3.87–5.11)
RDW: 12.3 % (ref 11.5–15.5)
RDW: 12.5 % (ref 11.5–15.5)
RDW: 12.6 % (ref 11.5–15.5)
RDW: 12.6 % (ref 11.5–15.5)
RDW: 12.5 % (ref 11.5–15.5)
WBC: 5.9 10*3/uL (ref 4.0–10.5)
WBC: 6 10*3/uL (ref 4.0–10.5)
WBC: 6.7 10*3/uL (ref 4.0–10.5)
WBC: 9.5 10*3/uL (ref 4.0–10.5)
WBC: 6.3 10*3/uL (ref 4.0–10.5)

## 2011-02-27 LAB — BASIC METABOLIC PANEL
BUN: 6 mg/dL (ref 6–23)
BUN: 9 mg/dL (ref 6–23)
CO2: 29 mEq/L (ref 19–32)
CO2: 31 mEq/L (ref 19–32)
Calcium: 9.2 mg/dL (ref 8.4–10.5)
Calcium: 9 mg/dL (ref 8.4–10.5)
Chloride: 99 mEq/L (ref 96–112)
Chloride: 103 mEq/L (ref 96–112)
Creatinine, Ser: 0.7 mg/dL (ref 0.4–1.2)
Creatinine, Ser: 0.83 mg/dL (ref 0.4–1.2)
GFR calc Af Amer: >60 mL/min (ref >60)
GFR calc Af Amer: >60 mL/min (ref >60)
GFR calc non Af Amer: >60 mL/min (ref >60)
GFR calc non Af Amer: >60 mL/min (ref >60)
Glucose, Bld: 96 mg/dL (ref 70–99)
Glucose, Bld: 101 mg/dL — ABNORMAL HIGH (ref 70–99)
Potassium: 4.1 mEq/L (ref 3.5–5.1)
Potassium: 4.1 mEq/L (ref 3.5–5.1)
Sodium: 137 mEq/L (ref 135–145)
Sodium: 139 mEq/L (ref 135–145)

## 2011-02-27 LAB — CARDIAC PANEL(CRET KIN+CKTOT+MB+TROPI)
CK, MB: 1.5 ng/mL (ref 0.3–4.0)
CK, MB: 2.1 ng/mL (ref 0.3–4.0)
Relative Index: INVALID (ref 0.0–2.5)
Relative Index: INVALID (ref 0.0–2.5)
Total CK: 70 U/L (ref 7–177)
Total CK: 92 U/L (ref 7–177)
Troponin I: <0.01 ng/mL (ref 0.00–0.06)
Troponin I: <0.01 ng/mL (ref 0.00–0.06)

## 2011-02-27 LAB — LIPID PANEL
Cholesterol: 133 mg/dL (ref 0–200)
HDL: 46 mg/dL (ref >39)
LDL Cholesterol: 71 mg/dL (ref 0–99)
Total CHOL/HDL Ratio: 2.9 RATIO
Triglycerides: 80 mg/dL (ref <150)
VLDL: 16 mg/dL (ref 0–40)

## 2011-02-27 LAB — HEPARIN LEVEL (UNFRACTIONATED)
Heparin Unfractionated: 0.22 IU/mL — ABNORMAL LOW (ref 0.30–0.70)
Heparin Unfractionated: 0.28 IU/mL — ABNORMAL LOW (ref 0.30–0.70)
Heparin Unfractionated: 0.68 IU/mL (ref 0.30–0.70)
Heparin Unfractionated: 1.34 IU/mL — ABNORMAL HIGH (ref 0.30–0.70)

## 2011-02-27 LAB — GLUCOSE, CAPILLARY: Glucose-Capillary: 164 mg/dL — ABNORMAL HIGH (ref 70–99)

## 2011-02-27 LAB — PROTIME-INR
INR: 0.9 (ref 0.00–1.49)
Prothrombin Time: 12.5 seconds (ref 11.6–15.2)

## 2011-02-27 LAB — TSH: TSH: 4.375 u[IU]/mL (ref 0.350–4.500)

## 2011-02-27 LAB — D-DIMER, QUANTITATIVE: D-Dimer, Quant: 0.27 ug/mL-FEU (ref 0.00–0.48)

## 2011-02-27 LAB — BRAIN NATRIURETIC PEPTIDE: Pro B Natriuretic peptide (BNP): <30 pg/mL (ref 0.0–100.0)

## 2011-03-02 ENCOUNTER — Ambulatory Visit (HOSPITAL_COMMUNITY)
Admission: RE | Admit: 2011-03-02 | Discharge: 2011-03-02 | Disposition: A | Discharge status: Home / Self Care | Payer: Medicare Other | Source: Ambulatory Visit | Admitting: Physical Therapy

## 2011-03-06 ENCOUNTER — Ambulatory Visit (HOSPITAL_COMMUNITY)
Admission: RE | Admit: 2011-03-06 | Discharge: 2011-03-06 | Disposition: A | Discharge status: Home / Self Care | Payer: Medicare Other | Source: Ambulatory Visit | Attending: Pulmonary Disease | Admitting: Pulmonary Disease

## 2011-03-08 ENCOUNTER — Ambulatory Visit (HOSPITAL_COMMUNITY)
Admission: RE | Admit: 2011-03-08 | Discharge: 2011-03-08 | Disposition: A | Discharge status: Home / Self Care | Payer: Medicare Other | Source: Ambulatory Visit | Attending: Pulmonary Disease | Admitting: Pulmonary Disease

## 2011-03-13 ENCOUNTER — Ambulatory Visit (HOSPITAL_COMMUNITY)
Admission: RE | Admit: 2011-03-13 | Discharge: 2011-03-13 | Disposition: A | Discharge status: Home / Self Care | Payer: Medicare Other | Source: Ambulatory Visit | Attending: Pulmonary Disease | Admitting: Pulmonary Disease

## 2011-03-13 DIAGNOSIS — M25559 Pain in unspecified hip: Secondary | ICD-10-CM | POA: Insufficient documentation

## 2011-03-13 DIAGNOSIS — IMO0001 Reserved for inherently not codable concepts without codable children: Secondary | ICD-10-CM | POA: Insufficient documentation

## 2011-03-13 DIAGNOSIS — M6281 Muscle weakness (generalized): Secondary | ICD-10-CM | POA: Insufficient documentation

## 2011-03-13 DIAGNOSIS — M25519 Pain in unspecified shoulder: Secondary | ICD-10-CM | POA: Insufficient documentation

## 2011-03-13 DIAGNOSIS — M25619 Stiffness of unspecified shoulder, not elsewhere classified: Secondary | ICD-10-CM | POA: Insufficient documentation

## 2011-03-13 DIAGNOSIS — M25649 Stiffness of unspecified hand, not elsewhere classified: Secondary | ICD-10-CM | POA: Insufficient documentation

## 2011-03-15 ENCOUNTER — Ambulatory Visit (HOSPITAL_COMMUNITY): Payer: Medicare Other

## 2011-03-20 ENCOUNTER — Ambulatory Visit (HOSPITAL_COMMUNITY): Payer: Medicare Other | Admitting: Physical Therapy

## 2011-03-22 ENCOUNTER — Ambulatory Visit (HOSPITAL_COMMUNITY): Payer: Medicare Other | Admitting: Physical Therapy

## 2011-03-27 NOTE — Assessment & Plan Note (Signed)
[facility]                            CARDIOLOGY OFFICE NOTE   NAME:Parsell, MICHE LOUGHRIDGE                       MRN:          [id number]  DATE:07/05/2008                            DOB:          1941-01-28    IDENTIFICATION:  Ms. Ignasiak is a 70 year old woman with a history of  coronary artery disease, dizziness, I last saw her in November 2008.   She comes in today.  Last week, she had an episode of chest pain and  also back pain (right scapular).  The symptoms were vague, not  associated with any particular activity, on and off from Tuesday to  Saturday.  Had had occasional left arm pain, but again not associated  with any activity.   Yesterday and today, she has been feeling okay.   She still has dizziness.  No syncope.  Again, dizziness is not brought  on by excessive standing.   CURRENT MEDICATIONS:  1. Magnesium.  2. Fiber.  3. Cymbalta 120.  4. Multivitamin daily.  5. Fish oil daily.  6. Prilosec 20 b.i.d.  7. Detrol 4.  8. Slo-Niacin 500.  9. Aspirin 81.  10.Calcium with the estradiol.  11.Simvastatin 20.  12.Wellbutrin.  13.Tylenol.   PHYSICAL EXAMINATION:  GENERAL:  The patient is in no distress.  VITAL SIGNS:  Orthostatic blood pressure 135/84, pulse 83 lying, the  patient was dizzy; sitting 132/85, pulse 88, the patient dizzy; standing  at 0 minutes 141/85, pulse 87, the patient okay; at 2 minutes 124/86,  pulse 84, the patient feeling okay; and at 5 minutes blood pressure  129/80, pulse 91, the patient feeling okay.  LUNGS:  Clear.  CARDIAC:  Regular rate and rhythm.  S1 and S2.  No S3.  No significant  murmurs.  ABDOMEN:  Benign.  EXTREMITIES:  No edema.   IMPRESSION:  1. Chest pain.  I am not completely convinced she most represents an      anginal equivalent.  Cardiac catheterization done in 2004 shows      mild disease of the circumflex 40%.  She has had a Myoview in April      2008 that was normal.  There is not  apparent association with      activity, she has been okay for couple of days.  I would have her      call next week to let us know how she is doing.  If her symptoms      get worse, I would get another Myoview.  For now, her back pain      actually on palpation of her back, I could bring on some of the      pain.  I think it is more muscular.  2. Dizziness.  Clearly does not appear to be orthostatic today.      Indeed her symptoms improved with standing.  I think she should be      evaluated for vestibular issues by ENT.  Note, it was empirically      treated with meclizine in the past.  I will put in a call to  see if      she can get seen for this.  (Dr. Jearld Fenton, who she had seen in the      past).  3. Dyslipidemia.  Continue on agents.  Note, her lipid panel back in      October 2008 showed excellent control with LDL particle number of      875 and HDL of 47.  She is at time now for needing a repeat and      this can be done.   I will wait to hear back from the patient.  Tentatively, set followup  for 6 months, unless her symptoms worsen, I will put in a call to ENT.     Pricilla Riffle, MD, [facility]  Electronically Signed    PVR/MedQ  DD: 07/07/2008  DT: 07/08/2008  Job #: [id number]   cc:   Ramon Dredge L. Juanetta Gosling, M.D.

## 2011-03-27 NOTE — H&P (Signed)
[name], [name] NO.:  [id number]   MEDICAL RECORD NO.:  [id number]          PATIENT TYPE:  INP   LOCATION:  2926                         FACILITY:  [facility]   PHYSICIAN:  Wendi Snipes, MD DATE OF BIRTH:  1941-07-25   DATE OF ADMISSION:  12/24/2008  DATE OF DISCHARGE:                              HISTORY & PHYSICAL   CARDIOLOGIST:  Rollene Rotunda, MD.   PRIMARY CARE PHYSICIAN:  Oneal Deputy. Juanetta Gosling, M.D.   CHIEF COMPLAINTS:  Chest pain.   HISTORY OF PRESENT ILLNESS:  This is a 70 year old white female with a  history of mild coronary artery disease and multiple additional chronic  medical problems, who presents with acute onset of chest pain.  She  presented to [facility] Emergency Department after the acute onset of  stabbing chest pain at approximately 4 p.m. while she was at rest.  Her  symptoms are associated with chest pressure as well, but she denies  associated shortness of breath, dyspnea on exertion, paroxysmal  nocturnal dyspnea or recent exertional angina.  She states that the pain  has continued since onset and waxes and wanes occasionally, however,  never goes away.  She states that prior to the onset of her symptoms,  she has been in her usual state of health enjoying an active lifestyle.  Her last stress test was in April 2008 and was normal.  At the [facility] ER, she was found to have no EKG changes and unremarkable point of  care troponin.  She was subsequently transferred to [facility] for  further evaluation and management of her chest pain concerning for  unstable angina.   PAST MEDICAL HISTORY:  1. Coronary disease status post cardiac catheterization in 2004      showing a mild 40% obstruction in the circumflex artery.  2. Fibromyalgia.  3. Bipolar disorder.  4. Gastroesophageal reflux disease.   ALLERGIES:  IBUPROFEN and VICODIN which causes GI upset.   MEDICATIONS ON ADMISSION:  1. Risperdal 0.25 mg at bedtime.  2.  Meloxicam 12.5 mg twice daily.  3. Zolpidem 10 mg at bedtime.  4. Estradiol 1 mg once a day.  5. Vytorin 10/20 mg at bedtime.  6. Oxybutynin chloride ER 20 mg a day.  7. Omeprazole 40 mg twice daily.  8. Aplenzin 522 mg once a day.  9. Cymbalta 60 mg daily.  10.Multivitamin daily.   SOCIAL HISTORY:  She lives in [place] with her husband.  She is  currently unemployed.  She does not smoke cigarettes.   FAMILY HISTORY:  Unremarkable for any early coronary disease.   REVIEW OF SYSTEMS:  All 14 systems were reviewed and were negative  except as mentioned in detail in the HPI.   PHYSICAL EXAMINATION:  VITAL SIGNS:  Blood pressure is 114/67, her pulse  is 95 and regular, respiratory rate is 16 and unlabored.  She is satting  95% on 2 liters nasal cannula.  GENERAL:  She is a 70 year old white female appearing stated age in no  acute distress.  HEENT:  Moist mucous membranes.  Pupils are equal,  round and reactive to light and accommodation.  Anicteric sclerae.  NECK:  No jugular venous distention.  No thyromegaly.  ABDOMEN:  Nontender, nondistended.  Positive bowel sounds.  No masses.  EXTREMITIES:  Pulses 2+ throughout.  No clubbing, cyanosis or edema.  NEURO:  Alert and oriented x3.  Cranial nerves II-XII are grossly  intact.  No focal neurologic deficits.  Psych, mood and affect are  appropriate.  SKIN:  Warm, dry, intact.  No rashes.   DIAGNOSTICS:  No acute cardiopulmonary process.  EKG shows normal sinus  rhythm with no ST/T-wave abnormalities suggestive of recent or ongoing  ischemia.  Normal EKG.   LABORATORY:  White blood cell count is 9.5, hematocrit 40, platelets  229, potassium 4.1, creatinine is 0.7.  BNP is undetectable.  Point of  care troponin is undetectable.   ASSESSMENT:  This is a 70 year old white female with a history of mild  atherosclerotic coronary disease here with acute onset of atypical chest  pain.  Chest pain:  Her symptoms are concerning for  unstable angina.  However,  there is currently no objective evidence of ischemia.  We will continue  her current medical regimen for existing coronary disease including  aspirin and statin.  We will also continue her heparin drip until she  rules out for MI.  We will continue to cycle cardiac enzymes and recheck  to serial EKGs for increasing symptoms.  We will treat her pain with  continued nitroglycerin drip, though this has not per provided much  help.  We will watch her closely with plan for a noninvasive risk  ratification in the morning.      Wendi Snipes, MD  Electronically Signed     BHH/MEDQ  D:  12/24/2008  T:  12/25/2008  Job:  [id number]

## 2011-03-27 NOTE — Cardiovascular Report (Signed)
[name], [name]                ACCOUNT NO.:  [id number]   MEDICAL RECORD NO.:  [id number]           PATIENT TYPE:   LOCATION:                                 FACILITY:   PHYSICIAN:  Arturo Morton. Riley Kill, MD, FACCDATE OF BIRTH:  [birth year]   DATE OF PROCEDURE:  DATE OF DISCHARGE:                            CARDIAC CATHETERIZATION   INDICATIONS:  [name] [name] is a 70 year old female who presents with  recurrent chest discomfort.  She went through cardiac catheterization in  2004, a film which we carefully reviewed.  She was seen and admitted,  and enzymes were negative.  Repeat cardiac catheterization was  recommended by Dr. Diona Browner.  Current study was done to assess coronary  anatomy.   PROCEDURES:  1. Left heart catheterization.  2. Selective coronary arteriography.  3. Selective left ventriculography.  4. Coronary arteriography before and after intracoronary nitroglycerin      administration in the left coronary artery.   DESCRIPTION OF PROCEDURE:  The patient was brought to the cath lab and  prepped and draped in the usual fashion.  We ended up using a Smart  needle to gain access to the right femoral artery and a 5-French sheath  was placed.  Following this, views of left coronary arteries were  obtained in multiple angiographic projections.  Intracoronary  nitroglycerin was administered and the LAD was reevaluated.  These  studies were carefully compared to the old studies in the room,  subsequently with the patient's husband in detail.  RCA angiography was  then performed.  Central aortic and left ventricular pressures were  measured with pigtail.  A ventriculography was performed in the RAO  projection.  There were no major complications.   She was taken to the holding area in satisfactory clinical condition.  ACT was appropriate for sheath removal.   HEMODYNAMIC DATA:  1. Central aortic pressure was 129/66, mean 91.  2. Left ventricular pressure 123/11.  3. There was  no gradient on pullback across the aortic valve.   ANGIOGRAPHIC DATA:  1. Ventriculography was done in the RAO projection.  Overall, systolic      function appeared to be preserved.  Ejection fraction would be felt      to be in the range of 60%.  No definite wall motion abnormalities      were visualized.  2. The right coronary artery has mild luminal irregularity.  There is      probably 20-30% narrowing proximal mid junction.  After this, there      is mild irregularity in about 20% narrowing at the entry to the      distal vessel.  There is a smaller PDA in posterolateral system all      of which appeared free of critical disease.  3. The left main is free of critical disease.  4. The LAD is calcified proximally.  There is an ostial lesion.  We do      have good views, specifically the LAO caudal view, as well as the      RAO caudal view lays out this area reasonably well.  It is      carefully compared to the previous study.  There appears to be      about 50-60% narrowing at the ostium, but compared to the previous      study the ostium actually appears to be somewhat improved.  The      vessel then provides a diagonal branch and a moderate-sized LAD.      There is mild luminal irregularity, but no critical stenosis.  5. The circumflex provides basically 3 marginal branches.  The first      one divides proximally and has a subbranch with 50-60% of ostial      narrowing, but is relatively a small vessel.  The larger branch of      the first marginal has about 40% narrowing and is a large vessel      distally.  The second marginal has about 30% proximal narrowing and      the AV circumflex has 50-70% segmental irregular plaque, but it is      a small-caliber vessel distally, and does not supply significant      myocardium.   CONCLUSIONS:  1. Preserved overall left ventricular systolic function.  2. A 20-30% proximal right coronary artery stenosis.  3. A 50-60% ostial left  anterior descending stenosis compared to the      previous study, this appears to be slightly improved from 6 years      earlier.  4. Scattered lesions of the circumflex as noted above.   DISPOSITION:  1. If the patient continues to have pain, then fractional flow reserve      could be done.  Previous Myoview was abnormal.  Because this      appeared somewhat better today, we did not elect to do this to      avoid anticoagulation for possible disruption of the lesion.  We      will go from there.  2. Continued medical therapy.  3. Check D-dimer.      Arturo Morton. Riley Kill, MD, [facility]  Electronically Signed     TDS/MEDQ  D:  12/27/2008  T:  12/27/2008  Job:  [id number]   cc:   Ramon Dredge L. Juanetta Gosling, M.D.  Pricilla Riffle, MD, [facility]  CV Laboratory

## 2011-03-27 NOTE — Assessment & Plan Note (Signed)
[facility]                       [place] CARDIOLOGY OFFICE NOTE   NAME:Diluzio, ORELIA BRANDSTETTER                       MRN:          [id number]  DATE:05/08/2007                            DOB:          [birth year]    IDENTIFICATION:  Patient is a 70 year old with a history of moderate CAD  by catheterization in 2004.  She was last seen in the clinic back in  April of this year.   When she was seen last, she had episodes of chest discomfort, and I  recommended an Adenosine Myoview.  She also had short-lived  palpitations, lasting seconds.  Since then, the patient continues to  have palpitations again, very short-lived.  Denies chest pain.   CURRENT MEDICATIONS:  1. Prilosec 20 b.i.d.  2. Detrol 4 daily.  3. Slow niacin 500 daily.  4. Aspirin 81 daily.  5. Estrogen.  6. Anti-inflammatory.  7. Lexapro 20 daily.  8. Wellbutrin 300 q.a.m.  9. Calcium 600 b.i.d.  10.Vytorin 1/2 of a 10/20 daily.   PHYSICAL EXAMINATION:  GENERAL:  Patient is in no distress.  VITAL SIGNS:  Blood pressure is 122/72.  Pulse is 80 and regular.  Weight 134.  LUNGS:  Clear.  CARDIAC:  Regular rate and rhythm.  S1 and S2.  No S3 or S4, murmurs, or  skips.  ABDOMEN:  Benign.  EXTREMITIES:  No edema.   IMPRESSION:  1. Coronary artery disease.  Myoview was done on April 23 that showed      no ischemia.  Ejection fraction of 75%.  Would continue on current      regimen.  2. Palpitations, short lived, last seconds.  I have told her to cut      back on caffeine.  She is drinking about four cups of half      caf/decaf.  If she is still having palpitations, could consider an      event monitor if they are bothersome.  I will also check a BMET.  3. Dyslipidemia:  Patient wonders if she can go on Zocor.  I think it      is worth a try.  She can start 20 of Zocor, continue on Niaspan.      We will follow up lipids in about eight weeks' time.  Again, may be      able to back her off  her cost savings.   Otherwise, I will set followup for six months, sooner if problems  develop.     Pricilla Riffle, MD, [facility]  Electronically Signed    PVR/MedQ  DD: 05/08/2007  DT: 05/09/2007  Job #: [id number]   cc:   Ramon Dredge L. Juanetta Gosling, M.D.

## 2011-03-27 NOTE — Assessment & Plan Note (Signed)
[facility]                            CARDIOLOGY OFFICE NOTE   NAME:Olarte, [name] [name]                       MRN:          [id number]  DATE:01/20/2009                            DOB:          20-Dec-1940    IDENTIFICATION:  [name] is a 70 year old woman.  She has a history  of CAD and dizziness.  I last saw her in August.   In the interval, she was hospitalized at [facility] (February 12 to  February 16).  She was admitted with chest pain that began suddenly, she  was at rest.  She was taken to [facility], monitored over the  weekend.  D-dimer was 0.27.  Cardiac catheterization was done by Dr.  Riley Kill on February 15.  This showed a normal LV function, 20-30%  proximal RCA stenosis, 50-60% ostial LAD stenosis that appear to be  better from 6 years prior, and scattered irregularities in the  circumflex.  She was sent home on medical therapy.   Since being at home, she has not had any further chest pain.  She still  was dizzy.  No syncope.   CURRENT MEDICINES:  1. Magnesium.  2. Fiber.  3. Cymbalta.  4. Multivitamin.  5. Fish oil.  6. Prilosec 40 b.i.d.  7. Detrol 40 daily.  8. Slo-Niacin.  9. Aspirin 81.  10.Calcium 600.  11.Estradiol daily.  12.__________  13.Tylenol daily.  14.Vytorin 10/20.  15.Cymbalta 60.   PHYSICAL EXAMINATION:  GENERAL:  On exam, the patient is in no distress  at rest.  VITAL SIGNS:  Blood pressure is 126/80.  Orthostatic check, the patient  dizzy lying 137/87, pulse 75; sitting dizzy 126/82, pulse 90; standing  dizzy 126/74, pulse 87.  The patient at about 2 minutes became very  dizzy, had to sit down, but before this, 144/79, pulse 77.  NECK:  JVP is normal.  LUNGS:  Clear.  CARDIAC:  Regular rate and rhythm, normal S1 and S2.  No S3.  No  significant murmurs.  ABDOMEN:  Benign.  EXTREMITIES:  No edema.  No hematoma in groin.   A 12-lead EKG; normal sinus rhythm, 88 beats per minute.   IMPRESSION:  1. Chest pain, not convinced it is cardiac per Dr. Rosalyn Charters note.      Recommendation was for medical therapy with continued symptoms,      consider for evaluation for fractional flow reserve, but would not      now.  We will set up for cardiac rehab though to document her      exercise tolerance to see if can improve her endurance and also      follow her blood pressures.  2. Dizziness, not orthostatic here or on last visit.  Again, we will      need to follow.  3. Dyslipidemia.  Lipid panel in the hospital showed very good control      with HDL of 46 and LDL of 71, would continue.   I will set to see the patient back at the end of June and follow up also  with response  to rehab.     Pricilla Riffle, MD, [facility]  Electronically Signed    PVR/MedQ  DD: 01/20/2009  DT: 01/21/2009  Job #: [id number]   cc:   Ramon Dredge L. Juanetta Gosling, M.D.

## 2011-03-27 NOTE — Assessment & Plan Note (Signed)
[facility]                            CARDIOLOGY OFFICE NOTE   NAME:[name], [name] BETTERS                       MRN:          [id number]  DATE:09/26/2007                            DOB:          [birth year]    IDENTIFICATION:  [name] [name] is a 70 year old woman whom I have followed  in Cardiology Clinic.  She was last seen in Cardiology Clinic back in  June of this year.  She comes in for return.   Note, the patient was seen on November 3rd in the [facility]  Emergency Room.  She was complaining of some dizziness.  It started on  the day she was seen, acute in onset, intermittent, appeared to be  improving some.  Noted some lightheadedness.  Symptoms appeared to be  aggravated by certain positions.  Also, had some chest pain and neck  pain.   Her evaluation conclusion was for tinnitus, and she was given a trial of  meclizine in the emergency room, and her symptoms improved some, and a  prescription on discharge.   Since discharge from the emergency room, she has continued to have some  dizziness.  It sounds like more with changes in the position of her  head.  She does have some dizziness with standing, but no syncope.  Chest pain does not appear to be a problem.   CURRENT MEDICATIONS:  Include:  1. Wellbutrin ER.  2. Risperdal 0.5.  3. Simvastatin 20.  4. Cymbalta 120.  5. Estrogen.  6. Detrol.  7. Fish oil.  8. Multivitamin.  9. Calcium with D.   PHYSICAL EXAMINATION:  On exam, the patient was in no distress.  Blood pressure lying 134/85.  Pulse 72.  On sitting immediately, blood  pressure 133/86, pulse 78.  Standing, some dizziness, 124/84, pulse 91.  Standing at 2 minutes, 120/80, pulse 96.  Standing at 5 minutes, 127/87,  pulse 92, some unsteadiness.  LUNGS:  Clear.  CARDIAC EXAM:  Regular rate and rhythm.  S1 and S2.  No S3 or S4.  ABDOMEN:  Benign.  EXTREMITIES:  No edema.   IMPRESSION:  1. Dizziness.  She has some increase  in her heart rate by 24 beats      with standing.  I am not convinced her symptoms are fully      orthostatic.  It does sound like she may have vertigo.  I did not      look in her ears.  I encouraged her to try the meclizine, and to      also follow up with Dr. Juanetta Gosling.  I encouraged her also to increase      her salt and fluid intake to see if this would help her symptoms as      well.  2. Coronary artery disease.  Myoview in April of this year showed no      ischemic, ejection fraction of 75%.  Not convinced her chest pain      in the emergency room was more than muscular.  3. History of palpitations.  No complaints at present.  4.  Dyslipidemia.  Continue on current regimen.   I will set to see the patient back in the winter, sooner if problems  develop.  I again encouraged her to follow up with Dr. Juanetta Gosling.     Pricilla Riffle, MD, [facility]  Electronically Signed    PVR/MedQ  DD: 10/01/2007  DT: 10/02/2007  Job #: [id number]   cc:   Ramon Dredge L. Juanetta Gosling, M.D.

## 2011-03-27 NOTE — Assessment & Plan Note (Signed)
NAMECLEONE, HULICK                 CHART#:  [id number]   DATE:  12/30/2007                       DOB:  07/09/1941   CHIEF COMPLAINT:  Abdominal pain, reflux.   OBJECTIVE:  Ms. Eskridge is here for a followup visit. She has a history  of GERD and chronic constipation.  She was last seen on December 04, 2007.  She presented with a couple of months history of left flank pain  which was intermittent in nature in the setting of constipation.  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  Prominent stool in the colon distal left ureter was prominent in size.  She also has been complaining of breakthrough heartburn, hoarseness,  cough, urge to clear throat.  She feels like it is related to her  reflux.  She has it throughout the day.   At her last office visit we increased her omeprazole from 20 to 40 mg  daily.  She states that she still only has a bowel movement every other  day to every 3 days.  She never feels like she gets good relief.  She  has a lot of abdominal bloating, and has to strain to have a bowel  movement.  She is currently on stool softeners, 2 daily.  She has trying  to increase her dietary fiber, but it has not seemed to help her stools.  She also tried Chiroklenz, but cannot tolerate the taste. We gave her  hyomax for abdominal pain, but she only took 1 and cannot tolerate it  due to dry mouth.  She has taken probiotics for 1 month with little  relief.   CURRENT MEDICATIONS:  See updated list.   ALLERGIES:  VICODIN.   PHYSICAL EXAMINATION:  Weight 132-1/2, stable.  Temperature 97.2, blood  pressure 118/82, pulse 60.  GENERAL:  A pleasant, well-nourished, well-developed, Caucasian female  in no acute distress.  SKIN:  Warm and dry.  No jaundice.  HEENT:  Sclerae anicteric.  Oropharyngeal mucosa moist and pink.  ABDOMEN:  Positive bowel sounds.  Abdomen soft, nondistended.  She has  very mild left flank  tenderness to deep palpation. No rebound or  guarding, no abdominal bruits or hernias.  No abdominal masses or  hepatosplenomegaly.  LOWER EXTREMITIES:  No edema.   IMPRESSION:  Yisroel Ramming is a 70 year old lady with left flank pain which is  mild-to-moderate, and now is more constant than before.  This is in the  setting of known IBS, worsening of constipation more recently. She has  abdominal bloating as well.  Her pain,may or may not be related to her  IBS, or constipation, but we have not adequately treated either to  determine whether it is or not.  In addition, she is having breakthrough  GERD-type symptoms and possible LPR.  She is on Mobic twice a day as  well and has been warned about the potential GI side effects.   PLAN:  1. MiraLax 17 g daily.  2. She is interested in trying a natural supplement for her reflux,      which she plans to obtain from her local nutritional store.  If      this does not work, then we will switch her PPI to possible  Protonix, AcipHex which she has not tried before.  3. For now she will continue omeprazole 40 mg daily.  4. She will call with a progress report in 2 weeks if she is not      better, then we will have her come back and see Dr. Jena Gauss for      further evaluation.  If her reflux is not improved, she may end up      needing Bravo studies, and/or evaluation by the ENT at some point.       Tana Coast, P.A.  Electronically Signed     R. Roetta Sessions, M.D.  Electronically Signed    LL/MEDQ  D:  12/30/2007  T:  12/31/2007  Job:  [id number]   cc:   Ramon Dredge L. Juanetta Gosling, M.D.

## 2011-03-27 NOTE — Assessment & Plan Note (Signed)
NAMELEKHA, DANCER                 CHART#:  [id number]   DATE:  12/04/2007                       DOB:  02-21-1941   CHIEF COMPLAINT:  Abdominal pain.   SUBJECTIVE:  The patient is here for further evaluation of abdominal  pain.  She saw Dr. Shaune Pollack for this recently and he asks that she  come back to see [place].  She is well known to our practice with a history  of gastroesophageal reflux disease.  She states for the past couple of  months she has had intermittent left flank pain.  It lasts 30 minutes at  a time.  Nothing seems to bring it on or alleviate it.  She describes it  as a 5 out of 10 on a pain scale, with 10 being the worst pain she has  ever experienced.  Pain does not seem to be related to any urinary  symptoms or to her bowel movements.  She states she has had kidney  stones in the past and it is not the same type pain.  She generally has  chronic constipation.  She has a bowel movement approximately every  other day to every 3 days.  She complains of abdominal bloating.  Her  stools are hard, she has to strain at times.  Denies any blood in the  stool or melena.  Denies any dysuria or hematuria.  No nausea or  vomiting.  She has intermittent breakthrough heartburn symptoms.  Her  workup this far includes a CT of the abdomen and pelvis and 10/22/2007  which revealed a probable small bowel malrotation without obstruction.  She has prominent stool in the colon.  The distal left ureter was  minimally prominent in size.   CURRENT MEDICATIONS:  See updated list.   ALLERGIES:  VICODIN.   PHYSICAL EXAMINATION:  Weight 133, temp 97.9, blood pressure 122/80,  pulse 72.  GENERAL:  A pleasant well-nourished, well-developed Caucasian female in  no acute distress.  SKIN:  Warm and dry, no jaundice.  HEENT:  Sclerae nonicteric.  Oropharyngeal mucosa moist and pink.  ABDOMEN:  Positive bowel sounds.  Abdomen soft, nontender, nondistended,  no organomegaly or masses, no rebound,  tenderness or guarding.  No  abdominal bruits or hernias.  LOWER EXTREMITIES:  No edema.   IMPRESSION:  The patient is a 70 year old lady with left flank pain,  intermittent, over the past couple of months.  Pain is mild to moderate.  No modifying or alleviating factors.  She has a history of irritable  bowel syndrome with constipation with worsening constipation more  recently.  It is unclear at this time if her pain is related to bowel  function or irritable bowel syndrome.  She does not appear to have any  urinary symptoms to explain the pain.  Recent CT reassuring.  She has  chronic gastroesophageal reflux disease with breakthrough symptoms at  this time, and is on Mobic twice a day which may be causing some of her  upper gastrointestinal discomfort.  She is already on omeprazole 20 mg  daily.   PLAN:  1. Increase omeprazole to 40 mg daily.  Prescription for #30 one      refill.  2. Irritable bowel syndrome, Advantage Probiotic one daily #30,      samples provided.  3. Trial of Hyomax  sublingual t.i.d. p.r.n. pain #30 zero refills.  4. Gas/bloat sheet provided.  5. Office visit in 4 weeks to assesses her progress.       Tana Coast, P.A.  Electronically Signed     R. Roetta Sessions, M.D.  Electronically Signed    LL/MEDQ  D:  12/04/2007  T:  12/04/2007  Job:  [id number]   cc:   Ramon Dredge L. Juanetta Gosling, M.D.

## 2011-03-27 NOTE — Discharge Summary (Signed)
NAMEKEIRY, KOWAL                ACCOUNT NO.:  [id number]   MEDICAL RECORD NO.:  [id number]          PATIENT TYPE:  INP   LOCATION:  2019                         FACILITY:  [facility]   PHYSICIAN:  Madolyn Frieze. Jens Som, MD, FACCDATE OF BIRTH:  1941/04/29   DATE OF ADMISSION:  12/24/2008  DATE OF DISCHARGE:  12/28/2008                               DISCHARGE SUMMARY   DISCHARGE DIAGNOSIS:  Chest pain status post cardiac catheterization by  Dr. Shawnie Pons on December 27, 2008.  The patient with a normal LVEF  of 60%.  The patient's films compared carefully to 2004 films.  LAD  ostium appears improved.  Recommendation of symptoms continued, could do  FFR, but since appears better we will hold off.   PAST MEDICAL HISTORY:  1. Coronary artery disease status post cardiac catheterization in 2004      showing a mild 40% obstruction in the circumflex artery.  2. Fibromyalgia.  3. Bipolar disorder.  4. GERD.   HOSPITAL COURSE:  [name] [name] is a 70 year old Caucasian woman with  history of mild coronary artery disease and multiple additional chronic  medical problems who presents with acute onset of chest pain at [facility].  She described it as acute stabbing chest pain, noticed  while resting.  Last stress test was done in April 2008 and was normal.  At [facility], she was found to have no EKG changes and unremarkable  point of cares.  The patient was transported to [facility] for further  evaluation, anticoagulated, and monitored over the weekend.  Underwent  cardiac catheterization on the December 27, 2008, tolerated procedure  without complications.  IV nitroglycerin and heparin was discontinued.  D-dimer was obtained.  On December 28, 2008, Dr. Jens Som in to see the  patient.  The patient was without further episodes of chest discomfort.  The patient is being discharged home to follow up with Dr. Tenny Craw in the  office.  The patient had a D-dimer of 0.27 also at time of  discharge.  She has been given the post cardiac catheterization discharge  instructions.  She is to follow up with Dr. Tenny Craw on January 20, 2009 at  4:00 p.m.  The patient is instructed to resume her home medications per  her medication reconciliation form including Risperdal, meloxicam,  Ambien, estradiol, Vytorin 10/20, oxybutynin ER, Cymbalta, multivitamin,  Wellbutrin, Valtrex, Aplenzin, omeprazole, and follow up with her  primary care physician, Dr. Kari Baars as needed.   DURATION OF DISCHARGE ENCOUNTER:  Less than 30 minutes.     Dorian Pod, ACNP      Madolyn Frieze. Jens Som, MD, [facility]  Electronically Signed   MB/MEDQ  D:  12/28/2008  T:  12/28/2008  Job:  [id number]

## 2011-03-30 NOTE — Consult Note (Signed)
NAME:  [name] [name], [name] [name]                          ACCOUNT NO.:  [id number]   MEDICAL RECORD NO.:  [id number]                   PATIENT TYPE:  INP   LOCATION:  A206                                 FACILITY:  [facility]   PHYSICIAN:  Vida Roller, M.D.                DATE OF BIRTH:  [birth year]   DATE OF CONSULTATION:  06/14/2003  DATE OF DISCHARGE:                                   CONSULTATION   CARDIOLOGY CONSULT   PRIMARY CARE [name] [name]:  Oneal Deputy. [name] [name], M.D.   [name] is a 70 year old white female with known coronary artery disease  status post heart catheterization on June 01, 2003 where she had a  nonobstructive coronary disease most significantly a 50-60% lesion in her  left anterior descending coronary artery with normal left ventricular  function.  She presents with worsening discomfort in her chest.  She has a  myriad of chest pain syndromes including suspected gastroesophageal reflux  disease orthopedic inquiries to her shoulders and fibromyalgia.   The description of her chest pain is a 1, that is, slightly concerning in  that she describes sort of this throbbing central pain in her shoulder which  has been chronic and not clearly related to exertion, but associated with  that is an element of new chest pain syndrome that was associated with  worsening of the shoulder pain.   PAST MEDICAL HISTORY:  Her past medical history is significant for coronary  artery disease, as previously described; gastroesophageal reflux disease;  bipolar disorder; hyperlipidemia; fibromyalgia; and multiple shoulder  surgeries.   MEDICATIONS ON PRESENTATION:  1. Aspirin 81 mg a day.  2. Nexium 80 mg a day.  3. Detrol 4 mg a day.  4. Neurontin 30 mg a day.  5. Risperdal 0.25 mg a day.  6. Wellbutrin 150 mg a day.  7. BuSpar 60 mg a day.  8. Lipitor 80 mg a day.  9. Niacin 500 mg a day which she has not taken and nitroglycerin p.r.n.   SOCIAL HISTORY:  She is a retired Runner, broadcasting/film/video.  She  resides in [place] with  her husband.  She does not smoke. She does use illicit drugs.  She does  occasional drink alcohol.   FAMILY HISTORY:  Her mother died at age 26 of a myocardial infarction.  Father died at age 67 of a myocardial infarction and peripheral vascular  disease.  She has 1 daughter who is alive and well.  One son who is alive  and well.   REVIEW OF SYSTEMS:  Noncontributory.   PHYSICAL EXAMINATION:  GENERAL:  She is a well-developed, well-nourished,  white female in no apparent distress.  MENTAL STATUS:  She is alert and oriented x4.  VITAL SIGNS:  Her blood pressure is 109/62; pulse is 65; She is afebrile.  HEENT:  Examination of the head, eyes, ears, nose, and throat is  unremarkable.  NECK:  The neck is supple.  There is no jugular venous distention or carotid  bruits.  CHEST:  Her chest is clear to auscultation.  CARDIAC:  Heart reveals a nondisplaced point of maximal impulse with no  lifts or thrills.  First and second heart sounds are normal.  There is no  third or fourth heart sound and no murmurs are noted.  ABDOMEN:  Her abdomen is soft, nontender, normoactive bowel sounds.  EXTREMITIES:  Her extremities were without significant clubbing, cyanosis,  or edema.  Her pulses were 1+ to 2+ throughout.  Her groin site looks well  healed.   She had a CT scan of her chest yesterday which showed no evidence of  pulmonary embolus, pneumonia or DVT.  She has electrocardiogram which shows  normal sinus rhythm with nonspecific ST-T wave changes.  We have no old EKGs  for comparison, but it is a reasonably benign looking electrocardiogram.  Blood gas showed a pH of 7.38 with a pCO2 of 44, pO2 of 65, and 92%  saturation on room air.  White blood cell count was 7.7.  H&H of 12 and 36  with a platelet count of  259,000.  PT and PTT were normal.  D-dimer was  0.54 which was slightly elevated just above the normal limit.  Sodium of  138, potassium of 3.3, chloride of  104, CO2 of 32, BUN of 10, creatinine of  0.7, with a blood sugar of 100.  Her LFTs were normal.  She has 2 sets of  cardiac enzymes which are inconsistent with acute myocardial infarction.  She has a normal beta naturetic peptide.  Her urinalysis and urine culture  was normal.   We have a lady who was recently worked up for coronary artery disease and  has nonobstructive coronary disease.  She has prolonged left shoulder pain  which is almost certainly not cardiac, chest pressure which is unclear as to  its etiology, but I doubt seriously at this is coronary.  At this point she  really is not on any significant antianginal medication.  Consideration may  be to start on a low dose of a beta blocker with the addition of a low dose  of nitrates.  Then, once she is adequately titrated on those medications we  can put her on a treadmill and see how she performs, just on a routine  treadmill.  I think that this is probably a reasonably plan.                                               Vida Roller, M.D.    JH/MEDQ  D:  06/14/2003  T:  06/14/2003  Job:  [id number]

## 2011-03-30 NOTE — Procedures (Signed)
   NAME:  [name] [name], [name] [name] NO.:  [id number]   MEDICAL RECORD NO.:  [id number]                    PATIENT TYPE:  PREC   LOCATION:                                       FACILITY:   PHYSICIAN:  Vida Roller, M.D.                DATE OF BIRTH:  1940/12/07   DATE OF PROCEDURE:  06/01/2003  DATE OF DISCHARGE:                                    STRESS TEST   ADENOSINE CARDIOLITE   INDICATION:  [name] is a 70 year old female with no known coronary  artery disease who had a coronary evaluation last year with an exercise  Cardiolite in June 2003 revealing no ischemia and normal LV function.  Her  cardiac risk factors include elevated lipids and family history.  She  presents now with atypical chest discomfort.   BASELINE DATA:  EKG shows sinus rhythm at 63 beats per minute with  nonspecific ST abnormalities.  Blood pressure is 110/70.   Adenosine 34 mg was infused over a four minute protocol with Cardiolite  injected at three minutes.  The patient reported chest tightness, shortness  of breath, and headache which resolved in recovery.  She also reported a  mild chest throbbing in her left anterior chest that was constant and no  changes were seen on her EKG that were suggestive of ischemia.  There were  no arrhythmias noted on EKG.  The patient was told to report to the office  or ER if pain did not subside or worsened.   Final images and results are pending M.D. review.     Amy Mercy Riding, P.A. [facility]                     Vida Roller, M.D.    AB/MEDQ  D:  06/01/2003  T:  06/01/2003  Job:  [id number]

## 2011-03-30 NOTE — Op Note (Signed)
NAME:  [name] [name], [name] [name]                ACCOUNT NO.:  [id number]   MEDICAL RECORD NO.:  [id number]          PATIENT TYPE:  AMB   LOCATION:  DAY                           FACILITY:  [facility]   PHYSICIAN:  R. Roetta Sessions, M.D. DATE OF BIRTH:  04-23-1941   DATE OF PROCEDURE:  01/29/2007  DATE OF DISCHARGE:                               OPERATIVE REPORT   OPERATION/PROCEDURE:  Colonoscopy.   INDICATIONS FOR PROCEDURE:  The patient is a 70 year old lady with  history of colonic adenomas.  Last colonoscopy in 2005 when at which  time polyps were removed.  She is having no lower GI tract symptoms.  Colonoscopy is now being done as surveillance maneuver.  This approach  has discussed the patient at length.  Potential risks, benefits and  alternatives have been reviewed, questions answered.  She is agreeable.  Please see documentation in the medical record.   PROCEDURE NOTE:  O2 saturation, blood pressure, pulse, respiration  monitored throughout entire procedure.  Conscious sedation Versed 4 mg  IV and Demerol 100 mg IV in divided doses.  Because of her recent joint  replacement, she received gentamicin 85 mg IV, ampicillin 2 grams IV  prior to procedure.  It was noted she had a pruritic rash on both upper  extremities prior to any medications here today. Consequently I have  given her Benadryl 25 mg IV at the time of the procedure.  Instrumentation Pentax video chip system.   FINDINGS:  Digital rectal exam revealed no abnormalities.  The prep was  good.  Examination of the colonic mucosa was undertaken from  rectosigmoid junction and the scope was advanced through the left,  transverse, right colon to appendiceal orifice, ileocecal valve, and  cecum.  These structures well seen and photographed for the record.  From this level, scope was withdrawn.  All previous mucosal surfaces  were again seen.  The patient was noted have left-sided diverticula.  There is some diffuse pigmentation of the  colonic mucosa consistent with  melanosis coli.  Remainder of the colon mucosa appeared normal.  The  scope was pulled down into the rectum where a thorough examination of  the rectal mucosa including retroflex view of the anal verge was  undertaken.  The rectal mucosa appeared normal.  The patient tolerated  the procedure well as reactive after endoscopy.   IMPRESSION:  Normal rectum, left-sided diverticula, diffusely pigmented  colon consistent with melanosis coli.  Remainder of colonic mucosa  appeared normal.   RECOMMENDATIONS:  1. A repeat colonoscopy 5 years.  2. Diverticulosis literature provided to Ms. Groft.  She should take      daily fiber supplementation.      Jonathon Bellows, M.D.  Electronically Signed     RMR/MEDQ  D:  01/29/2007  T:  01/29/2007  Job:  [id number]   cc:   Ramon Dredge L. Juanetta Gosling, M.D.  Fax: [phone]

## 2011-03-30 NOTE — Group Therapy Note (Signed)
   NAME:  [name] [name], [name] [name]                          ACCOUNT NO.:  [id number]   MEDICAL RECORD NO.:  [id number]                   PATIENT TYPE:  INP   LOCATION:  A206                                 FACILITY:  [facility]   PHYSICIAN:  Edward L. Juanetta Gosling, M.D.             DATE OF BIRTH:  [birth year]   DATE OF PROCEDURE:  06/14/2003  DATE OF DISCHARGE:  06/14/2003                                   PROGRESS NOTE   SUBJECTIVE:  [name] has had GI consultation and is set for an EGD and  colonoscopy as an outpatient next week.  There is some confusion about  exactly what Dr. Dorethea Clan wanted to do.  She feels like he did not have any  plans for testing.  There is an order on her chart for a stress test  tomorrow.  She wants to go home.  I am going to discharge her and see if we  can do the stress test as an outpatient.  Please see Discharge Summary for  details.                                               Edward L. Juanetta Gosling, M.D.    ELH/MEDQ  D:  06/14/2003  T:  06/15/2003  Job:  [id number]

## 2011-03-30 NOTE — Procedures (Signed)
[facility]  Patient:    [name] [name], [name] [name] Visit Number: [id number] MRN: [id number]          Service Type: OUT Location: RAD Attending Physician:  Kathlen Brunswick Dictated by:   Kari Baars, M.D. Proc. Date: 05/04/02 Admit Date:  05/08/2002 Discharge Date: 05/08/2002                            EKG Interpretations  The rhythm is a sinus rhythm with a rate in the 70s.  There is a suggestion of left atrial enlargement.  Otherwise, normal electrocardiogram. Dictated by:   Kari Baars, M.D. Attending Physician:  Kathlen Brunswick DD:  05/09/02 TD:  05/11/02 Job: [id number]

## 2011-06-13 HISTORY — PX: TOTAL HIP ARTHROPLASTY: SHX124

## 2011-06-21 ENCOUNTER — Ambulatory Visit (HOSPITAL_COMMUNITY)
Admission: RE | Admit: 2011-06-21 | Discharge: 2011-06-21 | Disposition: A | Discharge status: Home / Self Care | Payer: Medicare Other | Source: Ambulatory Visit | Attending: Pulmonary Disease | Admitting: Pulmonary Disease

## 2011-06-21 ENCOUNTER — Other Ambulatory Visit (HOSPITAL_COMMUNITY): Payer: Self-pay | Admitting: Pulmonary Disease

## 2011-06-21 DIAGNOSIS — R52 Pain, unspecified: Secondary | ICD-10-CM

## 2011-06-21 DIAGNOSIS — M1610 Unilateral primary osteoarthritis, unspecified hip: Secondary | ICD-10-CM | POA: Insufficient documentation

## 2011-06-21 DIAGNOSIS — M25559 Pain in unspecified hip: Secondary | ICD-10-CM | POA: Insufficient documentation

## 2011-07-26 ENCOUNTER — Ambulatory Visit (HOSPITAL_COMMUNITY)
Admission: RE | Admit: 2011-07-26 | Discharge: 2011-07-26 | Disposition: A | Discharge status: Home / Self Care | Payer: Medicare Other | Source: Ambulatory Visit | Attending: Orthopedic Surgery | Admitting: Orthopedic Surgery

## 2011-07-26 DIAGNOSIS — R262 Difficulty in walking, not elsewhere classified: Secondary | ICD-10-CM | POA: Insufficient documentation

## 2011-07-26 DIAGNOSIS — IMO0001 Reserved for inherently not codable concepts without codable children: Secondary | ICD-10-CM | POA: Insufficient documentation

## 2011-07-26 DIAGNOSIS — M25559 Pain in unspecified hip: Secondary | ICD-10-CM | POA: Insufficient documentation

## 2011-07-26 DIAGNOSIS — M6281 Muscle weakness (generalized): Secondary | ICD-10-CM | POA: Insufficient documentation

## 2011-07-26 DIAGNOSIS — R269 Unspecified abnormalities of gait and mobility: Secondary | ICD-10-CM | POA: Insufficient documentation

## 2011-07-26 DIAGNOSIS — M25551 Pain in right hip: Secondary | ICD-10-CM | POA: Insufficient documentation

## 2011-07-26 DIAGNOSIS — Z96643 Presence of artificial hip joint, bilateral: Secondary | ICD-10-CM | POA: Insufficient documentation

## 2011-07-26 HISTORY — DX: Difficulty in walking, not elsewhere classified: R26.2

## 2011-07-26 NOTE — Progress Notes (Signed)
Physical Therapy Evaluation  Patient Details  Name: [name] [name] MRN: [id number] Date of Birth: [birth year]  Today's Date: 07/26/2011 Time: 5784-6962 Time Calculation (min): 39 min Charges: 1 eval Visit#: 1 of 8 Re-eval: 08/25/11   Subjective Symptoms/Limitations Symptoms: Pt reports her L THR on 07/10/11.  2 weeks prior to surgery she started have shooting pain down the R leg.  She states that she was told to take it easy and is walking around the house a little bit and standing.  Has history of spinal fusion.  Pt reports she feels like she needs be moving around more.  Next MD visit 10/29.  Pain range: 3-9/10 to to the RLE when sitting on the commode, LLE: Achy/healing pain.  How long can you sit comfortably?: 30 minutes  How long can you stand comfortably?: 10 minutes How long can you walk comfortably?: 10 minutes Pain Assessment Currently in Pain?: Yes Pain Score:   7 Pain Location: Hip Pain Orientation: Left Pain Type: Acute pain Multiple Pain Sites: Yes  Precautions/Restrictions  Precautions Precautions: Posterior Hip (NO: Hip flexion past 90, IR of hip or active ADDuction) Precaution Booklet Issued: No  -issued from [facility]  Prior Functioning  Prior Function Level of Independence: Independent with basic ADLs;Needs assistance with gait (Using a cane) Driving: Yes Able to Take Stairs Reciprically: Yes Vocation: Retired Leisure: Hobbies-yes (Comment) Comments: Enjoys reading, gardening, housework  Assessment RLE Strength Right Hip Flexion: 3+/5 Right Hip Extension: 3+/5 Right Hip ABduction: 3/5 Right Knee Flexion: 5/5 Right Knee Extension: 5/5 LLE Strength Left Hip Flexion: 4/5 Left Hip Extension: 4/5 Left Hip ABduction: 4/5 Left Knee Flexion: 5/5 Left Knee Extension: 5/5 Lumbar Strength Lumbar Flexion: 3/5  Mobility (including Balance) Transfers Transfers: Yes Sit to Stand: 6: Modified independent (Device/Increase time);With armrests (5x in  11.0 sec ) Ambulation/Gait Ambulation/Gait: Yes Ambulation/Gait Assistance: 6: Modified independent (Device/Increase time) Assistive device: 4-wheeled walker Gait Pattern: Decreased stance time - left;Decreased hip/knee flexion - left;Decreased weight shift to left  Static Standing Balance Single Leg Stance - Right Leg: 10  Single Leg Stance - Left Leg: 2  Tandem Stance - Right Leg: 10  Tandem Stance - Left Leg: 2  Rhomberg - Eyes Opened: 10  Rhomberg - Eyes Closed: 10   Exercise/Treatments Standing  SLS x30 sec Supine  Bridging x10 R SLR x10 Sidelying  Hip Abduction x10 Prone   Press Up x1 minute  Physical Therapy Assessment and Plan PT Assessment and Plan Clinical Impression Statement: Pt is a 70 year old female referred to PT s/p L THR w/significant pain to her R hip.  After examiniation it was found that the patient has current body structure impairments including: increased bilateral hip pain, decreased hip and core strength, decreased power, impaired balance and difficulty walking which are limiting her abillity to participate in household and community activities.  Pt will benefit from skilled outpatient PT in order to address the above impairments in order to maximize function .  Rehab Potential: Good PT Frequency: Min 2X/week PT Duration: 4 weeks PT Treatment/Interventions: Gait training;Stair training;Functional mobility training;Therapeutic exercise;Other (comment) (modalities for pain control) PT Plan: Add: TM walking, gt training without rollator, balance, functional squats, heel/toe raises, steps, ab sets    Goals PT Short Term Goals Time to Complete Goals: 4 weeks PT Short Term Goal 1: 1.Pt will be Independent in HEP in order to maximize therapeutic effect. PT Short Term Goal 2: 2.Pt will ambulate x10 minutes independently on the treadmill w/o rest break  PT Short Term Goal 3: 3.Pt will improve static L single leg balance x10 sec. PT Short Term Goal 4: 4.Pt will  improve LE power by demonstrating 5 sit to stands without UE support. PT Long Term Goals PT Long Term Goal 1: 1.Pt will improve LE strength in order to ambulate independently x 30  minutes in order to participate in community exercise. PT Long Term Goal 2: 2.Pt will improve dynamic balance by demonstrating tandem gait on uneven surface x50 ft with supervision in order to return safely to outdoor activities.  Long Term Goal 3: 3.Pt will improve LE power and ascend/descend 10 stairs with 1 handrail with reciprocal pattern  in order to safely enter family and friends home.  Long Term Goal 4: 4.Pt will be independent with an advanced HEP in order to continue with exercise at the gym.   Problem List Patient Active Problem List  Diagnoses  . DYSLIPIDEMIA  . BIPOLAR AFFECTIVE DISORDER  . CAD, UNSPECIFIED SITE  . GERD  . FIBROMYALGIA  . DIZZINESS  . CHEST PAIN-UNSPECIFIED  . Status post THR (total hip replacement)  . Hip pain  . Difficulty in walking    PT - End of Session Activity Tolerance: Patient tolerated treatment well   Kathelyn Gombos 07/26/2011, 3:38 PM  Physician Documentation Your signature is required to indicate approval of the treatment plan as stated above.  Please sign and either send electronically or make a copy of this report for your files and return this physician signed original.   Please mark one 1.__approve of plan  2. ___approve of plan with the following conditions.   ______________________________                                                          _____________________ Physician Signature                                                                                                             Date

## 2011-08-01 ENCOUNTER — Telehealth (HOSPITAL_COMMUNITY): Payer: Self-pay

## 2011-08-01 ENCOUNTER — Ambulatory Visit (HOSPITAL_COMMUNITY)
Admission: RE | Admit: 2011-08-01 | Discharge: 2011-08-01 | Disposition: A | Discharge status: Home / Self Care | Payer: Medicare Other | Source: Ambulatory Visit

## 2011-08-01 NOTE — Progress Notes (Addendum)
Physical Therapy Treatment Patient Details  Name: [name] [name] MRN: [id number] Date of Birth: 01-Nov-1941  Today's Date: 08/01/2011 Time: 6578-4696 Time Calculation (min): 38 min Visit#: 2  of 8   Re-eval: 08/25/11  Charge: therex 30 min Gait training 8  Subjective: Symptoms/Limitations Symptoms: Pt reports L hip feeling good today, R knee is bothering her today rates 5-6/10 Pain Assessment Currently in Pain?: Yes Pain Score:   5 Pain Location: Knee Pain Orientation: Right  Precautions/Restrictions  Precautions  Precautions: Posterior Hip (NO: Hip flexion past 90, IR of hip or active ADDuction)  Precaution Booklet Issued: No -issued from [facility]  Objective:  Exercise/Treatments Gait training x 8 min Standing  SLS 2x30 sec  R6"; L 2" Heel raises 10x Toe raises 10x Functional squats Forward step up 4" 10x Lateral step up 4" 10 x Supine  Abd sets 10x 5"with diaphragmatic breathing  Bridging x10  B SLR x10  Sidelying  L Hip Abduction x10  Prone  Press Up x1 minute Hip ext 10x  Physical Therapy Assessment and Plan PT Assessment and Plan Clinical Impression Statement: Began new therex per PT plan, pt tolerated well with no c/o of increased pain.  Min vc for proper form to reduce knee stress with squats, no pain with L hip.  Reviewed precautions for posterior THR. PT Plan: Continue gt training without AD, add TM if operating, progress strength.    Goals    Problem List Patient Active Problem List  Diagnoses  . DYSLIPIDEMIA  . BIPOLAR AFFECTIVE DISORDER  . CAD, UNSPECIFIED SITE  . GERD  . FIBROMYALGIA  . DIZZINESS  . CHEST PAIN-UNSPECIFIED  . Status post THR (total hip replacement)  . Hip pain  . Difficulty in walking    PT - End of Session Activity Tolerance: Patient tolerated treatment well General Behavior During Session: [facility] for tasks performed Cognition: [facility] for tasks performed  Juel Burrow 08/01/2011, 5:16 PM

## 2011-08-02 ENCOUNTER — Ambulatory Visit (HOSPITAL_COMMUNITY)
Admission: RE | Admit: 2011-08-02 | Discharge: 2011-08-02 | Disposition: A | Discharge status: Home / Self Care | Payer: Medicare Other | Source: Ambulatory Visit | Attending: Pulmonary Disease | Admitting: Pulmonary Disease

## 2011-08-02 NOTE — Progress Notes (Addendum)
Physical Therapy Treatment Patient Details  Name: [name] [name] MRN: [id number] Date of Birth: 06-Oct-1941  Today's Date: 08/02/2011 Time: 4098-1191 Time Calculation (min): 28 min Visit#: 3  of 8   Re-eval: 08/24/11 Charges:  therex 22', gait 5'  Diagnosis:  L posterior hip replacement PRECAUTIONS:  B posterior hip replacements; with hip precautions    Subjective: Symptoms/Limitations Symptoms: Pt. reports her R hip/leg hurt worse than her L.  She states she is very sore today from the exercises last visit. Pain Assessment Currently in Pain?: Yes Pain Score:   1 Pain Location: Hip Pain Orientation: Left Pain Type:  (soreness) Multiple Pain Sites: Yes   Exercise/Treatments Gait training x 5 min without AD Standing  SLS max of 30" trials:  R:5"; L :5"  Heel raises 15x  Toe raises 15x  Functional squats 15X L Forward step up 4" 10x  L Lateral step up 4" 10 x  Supine  Bridging x15  B SLR x10  Hip ADD iso with soccer ball 10X5" Sidelying  B Hip Abduction 10X5" with AAROM and pillow b/w knees Prone  POE x1 minute  B Hip ext 15x   Physical Therapy Assessment and Plan PT Assessment and Plan Clinical Impression Statement: No new activities added today except supine hip add isometric due to LE soreness.  Pt. able to complete all exercises with good form and overall improved gait quality. PT Plan: Progress LE stability.  Add SLS with vectors and tandem gait; progress to outdoor gait without AD next visit.    Problem List Patient Active Problem List  Diagnoses  . DYSLIPIDEMIA  . BIPOLAR AFFECTIVE DISORDER  . CAD, UNSPECIFIED SITE  . GERD  . FIBROMYALGIA  . DIZZINESS  . CHEST PAIN-UNSPECIFIED  . Status post THR (total hip replacement)  . Hip pain  . Difficulty in walking    PT - End of Session Activity Tolerance: Patient tolerated treatment well General Behavior During Session: [facility] for tasks performed Cognition: [facility] for tasks performed  Emeline Gins  B 08/02/2011, 2:36 PM

## 2011-08-07 ENCOUNTER — Ambulatory Visit (HOSPITAL_COMMUNITY)
Admission: RE | Admit: 2011-08-07 | Discharge: 2011-08-07 | Disposition: A | Discharge status: Home / Self Care | Payer: Medicare Other | Source: Ambulatory Visit

## 2011-08-07 NOTE — Progress Notes (Signed)
Physical Therapy Treatment Patient Details  Name: [name] [name] MRN: [id number] Date of Birth: 07/14/1941  Today's Date: 08/07/2011 Time: 0930-1026 Time Calculation (min): 56 min Visit#: 4  of 8   Re-eval: 08/24/11  Charge: gait x 10 min therex 36 min Ice x 10 min  Subjective: Symptoms/Limitations Symptoms: L hip feels fine today, R hip and knee bothering me today. Pain Assessment Currently in Pain?: Yes Pain Score:   5 Pain Location: Hip Pain Orientation: Right;Left  Precautions/Restrictions  Precautions  Precautions: Posterior Hip (NO: Hip flexion past 90, IR of hip or active ADDuction)  Exercise/Treatments Gait training Outdoors with ramp and uneven surfaces without AD  Standing  SLS max of 30" trials: R:12"; L :3"  Vector stance 5x5" Tandem stance 2x 30" Tandem gait 1RT Heel raises 15x  Toe raises 15x  Functional squats 15X  L Forward step up 4" 15x  L Lateral step up 4" 15 x  L Step down 4" 10x Supine  Bridging x20  B SLR 2 sets x10  Hip ADD iso with soccer ball 10X5"  Sidelying  B Hip Abduction 10X5" with pillow b/w knees  Prone  B Hip ext 15x   Physical Therapy Assessment and Plan PT Assessment and Plan Clinical Impression Statement: Added balance activities and vector stance for L LE stability with no c/o of increased pain.  Pt able to complete all exercises with good form and improved gait quality outside with incline/declines no LOB episodes. PT Plan: Progress LE stability.    Goals    Problem List Patient Active Problem List  Diagnoses  . DYSLIPIDEMIA  . BIPOLAR AFFECTIVE DISORDER  . CAD, UNSPECIFIED SITE  . GERD  . FIBROMYALGIA  . DIZZINESS  . CHEST PAIN-UNSPECIFIED  . Status post THR (total hip replacement)  . Hip pain  . Difficulty in walking    PT - End of Session Activity Tolerance: Patient tolerated treatment well General Behavior During Session: [facility] for tasks performed Cognition: [facility] for tasks performed  Juel Burrow 08/07/2011, 12:15 PM

## 2011-08-09 ENCOUNTER — Ambulatory Visit (HOSPITAL_COMMUNITY)
Admission: RE | Admit: 2011-08-09 | Discharge: 2011-08-09 | Disposition: A | Discharge status: Home / Self Care | Payer: Medicare Other | Source: Ambulatory Visit

## 2011-08-09 NOTE — Progress Notes (Signed)
Physical Therapy Treatment Patient Details  Name: [name] [name] MRN: [id number] Date of Birth: 01/22/1941  Today's Date: 08/09/2011 Time: 3086-5784 Time Calculation (min): 44 min Visit#: 5  of 8   Re-eval: 08/24/11  Charge: therex 38 min  Subjective: Symptoms/Limitations Symptoms: L hip feels fine its the R knee bothering me today. Pain Assessment Currently in Pain?: Yes Pain Location: Knee Pain Orientation: Right  Precautions/Restrictions  Precautions  Precautions: Posterior Hip (NO: Hip flexion past 90, IR of hip or active ADDuction)  Exercise/Treatments Aerobic Stationary Bike: Upright Schwinn bike 6' Standing Lateral Step Up: 15 reps;Step Height: 4" Forward Step Up: 15 reps;Step Height: 4" Step Down: 15 reps;Step Height: 4" Functional Squat: 5 reps;Limitations Functional Squat Limitations: unable to complete secondary to R knee pain SLS: 1' hold R 8" and L 4" max Supine Hip Adduction Isometric: 15 reps;Limitations Hip Adduction Isometric Limitations: with soccer ball btwn knees Bridges: 20 reps Straight Leg Raises: 15 reps Sidelying Hip ABduction: 15 reps;Limitations Hip ABduction Limitations: pillow b/w knees Prone  Hip Extension: 15 reps;Both   Balance Exercises Stationary Bike: Upright Schwinn bike 6' Tandem Walking: 1 round trip Single Limb Stance: 1 minute (R 8", L 4" max of 3) Tandem Stance: Limitations Tandem Stance Limitations: 2 x 30"  Physical Therapy Assessment and Plan PT Assessment and Plan Clinical Impression Statement: B LE still unstabile.  Pt with increased R knee pain today with squats.  Pt instructured proper way to shave legs following hip replacement precautions. PT Plan: Progress LE strength and stability.    Goals    Problem List Patient Active Problem List  Diagnoses  . DYSLIPIDEMIA  . BIPOLAR AFFECTIVE DISORDER  . CAD, UNSPECIFIED SITE  . GERD  . FIBROMYALGIA  . DIZZINESS  . CHEST PAIN-UNSPECIFIED  . Status post THR  (total hip replacement)  . Hip pain  . Difficulty in walking    PT - End of Session Activity Tolerance: Patient tolerated treatment well General Behavior During Session: [facility] for tasks performed Cognition: [facility] for tasks performed  Juel Burrow 08/09/2011, 10:18 AM

## 2011-08-13 ENCOUNTER — Ambulatory Visit (HOSPITAL_COMMUNITY): Payer: Medicare Other | Admitting: Physical Therapy

## 2011-08-14 ENCOUNTER — Ambulatory Visit (HOSPITAL_COMMUNITY)
Admission: RE | Admit: 2011-08-14 | Discharge: 2011-08-14 | Disposition: A | Discharge status: Home / Self Care | Payer: Medicare Other | Source: Ambulatory Visit | Attending: Orthopedic Surgery | Admitting: Orthopedic Surgery

## 2011-08-14 DIAGNOSIS — IMO0001 Reserved for inherently not codable concepts without codable children: Secondary | ICD-10-CM | POA: Insufficient documentation

## 2011-08-14 DIAGNOSIS — M25559 Pain in unspecified hip: Secondary | ICD-10-CM | POA: Insufficient documentation

## 2011-08-14 DIAGNOSIS — R269 Unspecified abnormalities of gait and mobility: Secondary | ICD-10-CM | POA: Insufficient documentation

## 2011-08-14 DIAGNOSIS — M6281 Muscle weakness (generalized): Secondary | ICD-10-CM | POA: Insufficient documentation

## 2011-08-14 NOTE — Progress Notes (Signed)
Physical Therapy Treatment Patient Details  Name: [name] [name] MRN: [id number] Date of Birth: September 05, 1941  Today's Date: 08/14/2011 Time: 1520-1609 Time Calculation (min): 49 min Visit#: 6  of 8   Re-eval: 08/24/11 Charges:  therex 33', ice 10'     Subjective: Symptoms/Limitations Symptoms: R knee and hip hurting today about the same today.  L doesn't hurt at all. Pain Assessment Currently in Pain?: Yes Pain Score:   5 Pain Location: Knee Pain Orientation: Right  Precautions/Restrictions : Posterior Hip (NO: Hip flexion past 90, IR of hip or active ADDuction)    Exercise/Treatments Aerobic  Stationary Bike: Upright Schwinn bike 6'  Standing  Lateral Step Up: bilateral 15 reps;Step Height: 4"  Forward Step Up: bilateral 15 reps;Step Height: 4"  Step Down: bilateral 15 reps;Step Height: 4"  Functional Squat: 10 reps SLS: max hold R 5" and L 3" max  Tandem gait 2RT [place] (not done today) Supine  Hip Adduction Isometric: 15 reps with soccer ball btwn knees  Bridges: 20 reps  Straight Leg Raises: 2 sets of 10 reps  Sidelying  Hip ABduction: 15 reps pillow b/w knees AAROM Prone  Hip Extension: 15 reps;Both   Modalities Modalities: Cryotherapy Cryotherapy Number Minutes Cryotherapy: 10 Minutes Cryotherapy Location: Hip;Knee Type of Cryotherapy: Ice pack  Physical Therapy Assessment and Plan PT Assessment and Plan Clinical Impression Statement: Pt. continues to have more pain with Right hip and knee.  Progressing strength, however requires AA with sidelying hip abduction and manual assistance to decrease substitution with R hip extension. PT Treatment/Interventions: Therapeutic exercise (icepack) PT Plan: Progress stength and stability.     Problem List Patient Active Problem List  Diagnoses  . DYSLIPIDEMIA  . BIPOLAR AFFECTIVE DISORDER  . CAD, UNSPECIFIED SITE  . GERD  . FIBROMYALGIA  . DIZZINESS  . CHEST PAIN-UNSPECIFIED  . Status post THR (total hip  replacement)  . Hip pain  . Difficulty in walking    PT - End of Session Activity Tolerance: Patient tolerated treatment well General Behavior During Session: [facility] for tasks performed Cognition: [facility] for tasks performed  Emeline Gins B 08/14/2011, 4:04 PM

## 2011-08-16 ENCOUNTER — Ambulatory Visit (HOSPITAL_COMMUNITY)
Admission: RE | Admit: 2011-08-16 | Discharge: 2011-08-16 | Disposition: A | Discharge status: Home / Self Care | Payer: Medicare Other | Source: Ambulatory Visit

## 2011-08-16 NOTE — Progress Notes (Signed)
Physical Therapy Treatment Patient Details  Name: [name] [name] MRN: [id number] Date of Birth: 1940-11-27  Today's Date: 08/16/2011 Time: 0930-1025 Time Calculation (min): 55 min Visit#: 7  of 8   Re-eval: 08/24/11  Charge: therex 39 min Ice 10 min  Subjective: Symptoms/Limitations Symptoms: Did too much yesterday feel like I pulled my hamstring on R LE, rate my L hip a 2/10 my R hip and knee 4/10 Pain Assessment Currently in Pain?: Yes Pain Score:   2 Pain Location: Hip Pain Orientation: Left Multiple Pain Sites: Yes  Objective:   Exercise/Treatments Aerobic Stationary Bike: Upright Schwinn bike 6' Standing Lateral Step Up: Both;2 sets;10 reps;Step Height: 4" Forward Step Up: Both;2 sets;10 reps;Step Height: 4" Step Down: Both;15 reps;Step Height: 4" Functional Squat: 10 reps SLS: R 8"; L 5" max of 3 Other Standing Knee Exercises: hip hike 10 reps Supine Hip Adduction Isometric: 2 sets;10 reps;Limitations Hip Adduction Isometric Limitations: 5" holds with soccer ball btwn knees Bridges: 20 reps Sidelying Hip ABduction: 2 sets;10 reps;Limitations Hip ABduction Limitations: pillow b/w knees Prone  Hip Extension: 2 sets;10 reps Balance Exercises Stationary Bike: Upright Schwinn bike 6' Tandem Walking: 2 round trips Retro Gait: 1 round trip Tandem Stance: Limitations Tandem Stance Limitations: 2 x 30"  Ice on B hips and R knee x 10 min  Physical Therapy Assessment and Plan PT Assessment and Plan Clinical Impression Statement: Pt continues to have more pain R hip and knee.  Strength progressing, still weak glut medius.  Balance still impaired. PT Plan: Re-eval next session.    Goals    Problem List Patient Active Problem List  Diagnoses  . DYSLIPIDEMIA  . BIPOLAR AFFECTIVE DISORDER  . CAD, UNSPECIFIED SITE  . GERD  . FIBROMYALGIA  . DIZZINESS  . CHEST PAIN-UNSPECIFIED  . Status post THR (total hip replacement)  . Hip pain  . Difficulty in walking      PT - End of Session Activity Tolerance: Patient tolerated treatment well General Behavior During Session: [facility] for tasks performed Cognition: [facility] for tasks performed  Juel Burrow 08/16/2011, 10:21 AM

## 2011-08-21 LAB — URINALYSIS, ROUTINE W REFLEX MICROSCOPIC
Bilirubin Urine: NEGATIVE
Glucose, UA: NEGATIVE
Hgb urine dipstick: NEGATIVE
Ketones, ur: NEGATIVE
Nitrite: NEGATIVE
Protein, ur: NEGATIVE
Specific Gravity, Urine: <1.005 — ABNORMAL LOW
Urobilinogen, UA: 0.2
pH: 6

## 2011-08-21 LAB — CBC
HCT: 35.4 — ABNORMAL LOW
Hemoglobin: 12.3
MCHC: 34.8
MCV: 99.7
Platelets: 238
RBC: 3.55 — ABNORMAL LOW
RDW: 16.5 — ABNORMAL HIGH
WBC: 8.2

## 2011-08-21 LAB — POCT CARDIAC MARKERS
CKMB, poc: 1.8
CKMB, poc: 1.8
Myoglobin, poc: 84.8
Myoglobin, poc: 88.5
Operator id: 215201
Operator id: 216461
Troponin i, poc: <0.05
Troponin i, poc: <0.05

## 2011-08-21 LAB — COMPREHENSIVE METABOLIC PANEL
ALT: 27
AST: 27
Albumin: 3.7
Alkaline Phosphatase: 61
BUN: 5 — ABNORMAL LOW
CO2: 31
Calcium: 8.7
Chloride: 101
Creatinine, Ser: 0.63
GFR calc Af Amer: >60
GFR calc non Af Amer: >60
Glucose, Bld: 107 — ABNORMAL HIGH
Potassium: 3.8
Sodium: 135
Total Bilirubin: 0.6
Total Protein: 6.1

## 2011-08-21 LAB — DIFFERENTIAL
Basophils Absolute: 0
Basophils Relative: 0
Eosinophils Absolute: 0.2
Eosinophils Relative: 2
Lymphocytes Relative: 14
Lymphs Abs: 1.2
Monocytes Absolute: 0.6
Monocytes Relative: 8
Neutro Abs: 6.2
Neutrophils Relative %: 76

## 2011-08-24 ENCOUNTER — Ambulatory Visit: Payer: Medicare Other | Admitting: Internal Medicine

## 2011-08-28 ENCOUNTER — Ambulatory Visit (HOSPITAL_COMMUNITY)
Admission: RE | Admit: 2011-08-28 | Discharge: 2011-08-28 | Disposition: A | Discharge status: Home / Self Care | Payer: Medicare Other | Source: Ambulatory Visit | Attending: Physical Therapy | Admitting: Physical Therapy

## 2011-08-28 NOTE — Progress Notes (Signed)
Physical Therapy Evaluation  Patient Details  Name: [name] [name] MRN: [id number] Date of Birth: 01/23/1941  Today's Date: 08/28/2011 Time: 7829-5621 Time Calculation (min): 41 min Charges: 87' TE, 1 MMT Visit#: 8  of 16   Re-eval: 09/27/11    Past Medical History: No past medical history on file. Past Surgical History: No past surgical history on file.  Subjective Symptoms/Limitations Symptoms: Pt reports she does not have any pain in her L hip, all of her pain is in her R knee. She reports she is still struggling with decreased endurance overall.  Pain Assessment Currently in Pain?: Yes Pain Score:   3 Pain Location: Knee Pain Orientation: Right  Assessment LLE Strength Left Hip Flexion: 4/5 Left Hip Extension: 3+/5 Left Hip ABduction: 3+/5  Exercise/Treatments  08/28/11 0700  Knee Exercises: Aerobic  Stationary Bike Upright Schwinn bike 6'  Tread Mill 5 min w/1 HHA 0.9-1.0 mph x5 mph after ther-ex for endurance cueing for posture  Knee Exercises: Machines for Strengthening  Cybex Knee Extension 2 PL 2x10 for muscular endurance  Cybex Knee Flexion 3 PL 2x10 for muscular endurance  Knee Exercises: Standing  SLS R 8" L 4"  Knee Exercises: Supine  Bridges 20 reps  Knee Exercises: Sidelying  Other Sidelying Knee Exercises L clam 2x15  Knee Exercises: Prone  Hamstring Curl 15 reps;Limitations  Hamstring Curl Limitations 3#  Hip Extension Left;15 reps;Limitations  Hip Extension Limitations 3#  Other Prone Exercises heel squeeze 10x5" hold     Physical Therapy Assessment and Plan PT Assessment and Plan Clinical Impression Statement: Ms. Cawthorn has has met 2 of 4 STG and 1 of 4 LTG. She has made steady progress with her overall gait mechanics and improved independence.  She reports a decrease in L hip pain however continues to have the greatest limitations with R knee pain which limits her ability to participate in extended endurance activities.  Ms. Blalock will  continue to benefit from skilled outpatient PT services in order to address decreased muscular endurance, decreased activity tolerance and impaired balance in order to maximize independence.   Rehab Potential: Good PT Frequency: Min 2X/week PT Duration: 4 weeks PT Treatment/Interventions: Gait training;Stair training;Functional mobility training;Therapeutic exercise;Other (comment) (Muscular Endurance training, modalities for pain control) PT Plan: TM walking (up to 10 minutes), continue with gluteal and hip strengthening (wall squats, standing 4 way hip w/tband) low weight high reps to increase muscular endurance    Goals PT Short Term Goals Time to Complete Short Term Goals: 4 weeks PT Short Term Goal 1: 1.Pt will be Independent in HEP in order to maximize therapeutic effect. PT Short Term Goal 1 - Progress: Met PT Short Term Goal 2: 2.Pt will ambulate x10 minutes independently on the treadmill w/o rest break PT Short Term Goal 2 - Progress: Partly met PT Short Term Goal 3: 3.Pt will improve static L single leg balance x10 sec. PT Short Term Goal 3 - Progress: Not met PT Short Term Goal 4: 4.Pt will improve LE power by demonstrating 5 sit to stands without UE support. PT Short Term Goal 4 - Progress: Met PT Long Term Goals Time to Complete Long Term Goals: 4 weeks PT Long Term Goal 1: 1.Pt will improve LE strength in order to ambulate independently x 30  minutes in order to participate in community exercise. PT Long Term Goal 1 - Progress: Partly met PT Long Term Goal 2: 2.Pt will improve dynamic balance by demonstrating tandem gait on uneven surface x50 ft  with supervision in order to return safely to outdoor activities.  PT Long Term Goal 2 - Progress: Not met Long Term Goal 3: 3.Pt will improve LE power and ascend/descend 10 stairs with 1 handrail with reciprocal pattern  in order to safely enter family and friends home.  Long Term Goal 3 Progress: Met Long Term Goal 4: 4.Pt will be  independent with an advanced HEP in order to continue with exercise at the gym.  Long Term Goal 4 Progress: Not met  Problem List Patient Active Problem List  Diagnoses  . DYSLIPIDEMIA  . BIPOLAR AFFECTIVE DISORDER  . CAD, UNSPECIFIED SITE  . GERD  . FIBROMYALGIA  . DIZZINESS  . CHEST PAIN-UNSPECIFIED  . Status post THR (total hip replacement)  . Hip pain  . Difficulty in walking        Ali Mohl 08/28/2011, 4:13 PM  Physician Documentation Your signature is required to indicate approval of the treatment plan as stated above.  Please sign and either send electronically or make a copy of this report for your files and return this physician signed original.   Please mark one 1.__approve of plan  2. ___approve of plan with the following conditions.   ______________________________                                                          _____________________ Physician Signature                                                                                                             Date

## 2011-09-10 DIAGNOSIS — M1610 Unilateral primary osteoarthritis, unspecified hip: Secondary | ICD-10-CM | POA: Insufficient documentation

## 2011-09-11 ENCOUNTER — Ambulatory Visit (HOSPITAL_COMMUNITY)
Admission: RE | Admit: 2011-09-11 | Discharge: 2011-09-11 | Disposition: A | Discharge status: Home / Self Care | Payer: Medicare Other | Source: Ambulatory Visit | Attending: Pulmonary Disease | Admitting: Pulmonary Disease

## 2011-09-11 NOTE — Progress Notes (Signed)
Physical Therapy Treatment Patient Details  Name: [name] [name] MRN: [id number] Date of Birth: [birth year]  Today's Date: 09/11/2011 Time: 0932-1020 Time Calculation (min): 48 min Charges: Gait: 15', TE: 66' Visit#: 9  of 16   Re-eval: 09/27/11    Subjective: Symptoms/Limitations Symptoms: Pt reports that overall she is doing pretty well and that she was able to go shopping for 4 hours without any pain to her L hip, but did have pain in her R hip.  Pain Assessment Currently in Pain?: Yes Pain Location: Hip Pain Orientation: Right   Exercise/Treatments Stability Bridge: Limitations Bridge Limitations: with knee pushes 10x each, with hip side to side glides 10x, green ball bridge x10, roll ups on grn ball x10 Straight Leg Raise: Limitations Straight Leg Raises Limitations: Prone x10 each, 45 degree angle (S/L) x10 each, Sidelying adduction x10 each Wall Slides: 10 reps;3 seconds Seated Roll in and out with heels and toes in seated 5x5 sec hold each direction Pilates marching 3x3 Balance: Tandem Walking 3 RT Retro Walking 3 RT  Machine Exercises Tread Mill: 10 min 1.3 mph w/o UE support     Physical Therapy Assessment and Plan PT Assessment and Plan Clinical Impression Statement: Pt tolerated all increased ther-ex well.  Continues to have the greatest difficulty with RLE weakness which is the primary cause of decreased balance and improper gait mechanics.  Today's treatment focused on improving pelvic mobility through closed chain activities.  PT Plan: Continue to improve gait and balance.     Goals    Problem List Patient Active Problem List  Diagnoses  . DYSLIPIDEMIA  . BIPOLAR AFFECTIVE DISORDER  . CAD, UNSPECIFIED SITE  . GERD  . FIBROMYALGIA  . DIZZINESS  . CHEST PAIN-UNSPECIFIED  . Status post THR (total hip replacement)  . Hip pain  . Difficulty in walking    PT - End of Session Activity Tolerance: Patient tolerated treatment well  Terrica Duecker 09/11/2011, 10:34 AM

## 2011-09-13 ENCOUNTER — Ambulatory Visit (HOSPITAL_COMMUNITY)
Admission: RE | Admit: 2011-09-13 | Discharge: 2011-09-13 | Disposition: A | Discharge status: Home / Self Care | Payer: Medicare Other | Source: Ambulatory Visit | Attending: Orthopedic Surgery | Admitting: Orthopedic Surgery

## 2011-09-13 DIAGNOSIS — M6281 Muscle weakness (generalized): Secondary | ICD-10-CM | POA: Insufficient documentation

## 2011-09-13 DIAGNOSIS — M25559 Pain in unspecified hip: Secondary | ICD-10-CM | POA: Insufficient documentation

## 2011-09-13 DIAGNOSIS — R269 Unspecified abnormalities of gait and mobility: Secondary | ICD-10-CM | POA: Insufficient documentation

## 2011-09-13 DIAGNOSIS — IMO0001 Reserved for inherently not codable concepts without codable children: Secondary | ICD-10-CM | POA: Insufficient documentation

## 2011-09-13 NOTE — Progress Notes (Signed)
Physical Therapy Treatment Patient Details  Name: [name] [name] MRN: [id number] Date of Birth: July 02, 1941  Today's Date: 09/13/2011 Time: 4098-1191 Time Calculation (min): 39 min Visit#: 10  of 16   Re-eval: 09/27/11  Charge: gait 10 min therex 28 min  Subjective: Symptoms/Limitations Symptoms: Pt stated L hip is doing well, pain scale 4/10 for R hamstring and R knee today. Pain Assessment Currently in Pain?: Yes Pain Score:   4 Pain Location: Leg Pain Orientation: Right  Objective:   Exercise/Treatments Aerobic Tread Mill: 10 min at 1.5 mph Seated Other Seated Knee Exercises: Hip IR with ball between knees and ER tband around knees Other Seated Knee Exercises: 5 STS in 8 sec Supine Bridges: 10 reps;Limitations Bridges Limitations: 10 gliding from side to side, 10 reps h/s curl, 10 bridges on green ball Balance Exercises Tread Mill: 10 min at 1.5 mph Tandem Walking: 3 round trips Retro Gait: 3 round trips Single Limb Stance: Right;Left;Limitations Single Limb Stance Limitations: 30" SLS with 1 finger A;  L 4", R 9" max of 4 Tandem Stance: Limitations Tandem Stance Limitations: 2 x 30"  Physical Therapy Assessment and Plan PT Assessment and Plan Clinical Impression Statement: Todays treatment focus on improving gait and balance.  VC required for equal stance phases with gait secondary to increase R knee pain at entrance today.  Balance still impaired. PT Plan: Continue with current POC, improve gait, strength and balance.    Goals    Problem List Patient Active Problem List  Diagnoses  . DYSLIPIDEMIA  . BIPOLAR AFFECTIVE DISORDER  . CAD, UNSPECIFIED SITE  . GERD  . FIBROMYALGIA  . DIZZINESS  . CHEST PAIN-UNSPECIFIED  . Status post THR (total hip replacement)  . Hip pain  . Difficulty in walking    PT - End of Session Activity Tolerance: Patient tolerated treatment well General Behavior During Session: [facility] for tasks performed Cognition: [facility] for tasks  performed  Juel Burrow 09/13/2011, 10:15 AM

## 2011-09-14 ENCOUNTER — Other Ambulatory Visit: Payer: Self-pay | Admitting: *Deleted

## 2011-09-14 MED ORDER — SIMVASTATIN 40 MG PO TABS: 40.0000 mg | ORAL_TABLET | Freq: Every day | ORAL | Status: DC

## 2011-09-18 ENCOUNTER — Ambulatory Visit (HOSPITAL_COMMUNITY)
Admission: RE | Admit: 2011-09-18 | Discharge: 2011-09-18 | Disposition: A | Discharge status: Home / Self Care | Payer: Medicare Other | Source: Ambulatory Visit | Attending: Pulmonary Disease | Admitting: Pulmonary Disease

## 2011-09-18 NOTE — Progress Notes (Signed)
Physical Therapy Treatment Patient Details  Name: [name] [name] MRN: [id number] Date of Birth: [birth year]  Today's Date: 09/18/2011 Time: 1478-2956 Time Calculation (min): 37 min Visit#: 11  of 16   Re-eval: 09/27/11 Charges: Therex x 25' Gait x 10'  Subjective: Symptoms/Limitations Symptoms: Pt reports R hip and knee pain. Pain Assessment Currently in Pain?: Yes Pain Score:   4 Pain Location: Hip Pain Orientation: Right   Exercise/Treatments  Tread Mill: 10 min at 1.5 mph  Aerobic Tread Mill: 10 min at 1.5 mph Seated Other Seated Knee Exercises: Seated Roll in and out with heels and toes in seated 5x5 sec hold each direction Other Seated Knee Exercises: 5 STS in 7 sec  Balance Exercises Tread Mill: 10 min at 1.5 mph Tandem Walking: 3 round trips Retro Gait: 3 round trips Single Limb Stance: Right;Left;Limitations Single Limb Stance Limitations: 3x 30" SLS Tandem Stance: Limitations Tandem Stance Limitations: 2 x 30"  Physical Therapy Assessment and Plan PT Assessment and Plan Clinical Impression Statement: Pt wth increased stability in LLE than RLE with balance activities. Pt able to complete 5 sit to stands in 7 seconds. Pt with increased R medial knee pain with wall squats.  PT Treatment/Interventions: Therapeutic exercise;Balance training;Gait training PT Plan: Continue to progress per PT POC.    Goals    Problem List Patient Active Problem List  Diagnoses  . DYSLIPIDEMIA  . BIPOLAR AFFECTIVE DISORDER  . CAD, UNSPECIFIED SITE  . GERD  . FIBROMYALGIA  . DIZZINESS  . CHEST PAIN-UNSPECIFIED  . Status post THR (total hip replacement)  . Hip pain  . Difficulty in walking    PT - End of Session Activity Tolerance: Patient tolerated treatment well General Behavior During Session: [facility] for tasks performed Cognition: [facility] for tasks performed  Antonieta Iba 09/18/2011, 10:25 AM

## 2011-09-20 ENCOUNTER — Ambulatory Visit (HOSPITAL_COMMUNITY)
Admission: RE | Admit: 2011-09-20 | Discharge: 2011-09-20 | Disposition: A | Discharge status: Home / Self Care | Payer: Medicare Other | Source: Ambulatory Visit | Attending: Pulmonary Disease | Admitting: Pulmonary Disease

## 2011-09-20 NOTE — Progress Notes (Signed)
Physical Therapy Treatment Patient Details  Name: [name] [name] MRN: [id number] Date of Birth: [birth year]  Today's Date: 09/20/2011 Time: 3086-5784 Time Calculation (min): 39 min Visit#: 12  of 16   Re-eval: 09/27/11 Charges: therex x 29'  Subjective: Symptoms/Limitations Symptoms: Pt reports that her R hip continues to bother her but her left does not. Pain Assessment Currently in Pain?: Yes Pain Score:   4 Pain Location: Hip Pain Orientation: Right   Exercise/Treatments Stability Bent Knee Raise: 10 reps;Supine Machine Exercises Tread Mill: 10 min at 1.5 mph Seated Other Seated Knee Exercises: Seated Roll in and out with heels and toes in seated 5x5 sec hold each direction Balance Exercises Tandem Walking: 3 round trips Retro Gait: 3 round trips Single Limb Stance: Right;Left;Limitations Single Limb Stance Limitations: 3x 30" SLS Tandem Stance: Limitations Tandem Stance Limitations: 2 x 30"  Physical Therapy Assessment and Plan PT Assessment and Plan Clinical Impression Statement: Pt displays increased stability in BLE with SLS. Pt complets tandem stance and tandem walk with increased ease this tx. Pt completes bent knee raises with good core control and hip stability. Pt reprots no increase in pain at end of session. PT Treatment/Interventions: Therapeutic exercise;Balance training PT Plan: Continue per PT POC.     Problem List Patient Active Problem List  Diagnoses  . DYSLIPIDEMIA  . BIPOLAR AFFECTIVE DISORDER  . CAD, UNSPECIFIED SITE  . GERD  . FIBROMYALGIA  . DIZZINESS  . CHEST PAIN-UNSPECIFIED  . Status post THR (total hip replacement)  . Hip pain  . Difficulty in walking    PT - End of Session Activity Tolerance: Patient tolerated treatment well General Behavior During Session: [facility] for tasks performed Cognition: [facility] for tasks performed  Antonieta Iba 09/20/2011, 10:30 AM

## 2011-09-25 ENCOUNTER — Ambulatory Visit (HOSPITAL_COMMUNITY)
Admission: RE | Admit: 2011-09-25 | Discharge: 2011-09-25 | Disposition: A | Discharge status: Home / Self Care | Payer: Medicare Other | Source: Ambulatory Visit | Attending: Pulmonary Disease | Admitting: Pulmonary Disease

## 2011-09-25 NOTE — Progress Notes (Signed)
Physical Therapy Treatment Patient Details  Name: [name] [name] MRN: [id number] Date of Birth: 02-23-1941  Today's Date: 09/25/2011 Time: 4098-1191 Time Calculation (min): 53 min Visit#: 13  of 16   Re-eval: 09/27/11 Charges: Gait x 10' Therex x 30' Ice x 10'  Subjective: Symptoms/Limitations Symptoms: My right knee is hurting today. Pain Assessment Currently in Pain?: Yes Pain Score:   5 Pain Location: Knee Pain Orientation: Right   Exercise/Treatments Aerobic Tread Mill: 10 min at 1.5 mph Seated Other Seated Knee Exercises: Seated Roll in and out with heels and toes in seated 10x5 sec hold each direction Supine Bridges Limitations: 15 gliding from side to side, 10 reps h/s curl on green ball  Balance Exercises Tandem Walking: 3 round trips Retro Gait: 3 round trips Single Limb Stance: Right;Left;Limitations Single Limb Stance Limitations: 3x 30" SLS Tandem Stance: Limitations Tandem Stance Limitations: 2 x 30" Wall Bumps: Limitations Wall Bumps Limitations: hips and shoulders Heel Raises: 15 reps Toe Raise: 15 reps  Modalities Modalities: Cryotherapy Cryotherapy Number Minutes Cryotherapy: 10 Minutes Cryotherapy Location: Knee (Right) Type of Cryotherapy: Ice pack  Physical Therapy Assessment and Plan PT Assessment and Plan Clinical Impression Statement: Pt with few LOB on TM, able to recover independently. Began wall bumps to improve proprioceptive awarenss. Pt able to complete tandem stance for 30" independently. Ice applied to R knee to decrease pain after standing activites(approved by Annett Fabian, PT).  Pt reports pain decrease in R knee to 1/10 at end of session. PT Plan: Continue to progress per PT POC.     Problem List Patient Active Problem List  Diagnoses  . DYSLIPIDEMIA  . BIPOLAR AFFECTIVE DISORDER  . CAD, UNSPECIFIED SITE  . GERD  . FIBROMYALGIA  . DIZZINESS  . CHEST PAIN-UNSPECIFIED  . Status post THR (total hip replacement)  . Hip  pain  . Difficulty in walking    PT - End of Session Activity Tolerance: Patient tolerated treatment well General Behavior During Session: [facility] for tasks performed Cognition: [facility] for tasks performed  Antonieta Iba 09/25/2011, 10:34 AM

## 2011-09-27 ENCOUNTER — Ambulatory Visit (HOSPITAL_COMMUNITY)
Admission: RE | Admit: 2011-09-27 | Discharge: 2011-09-27 | Disposition: A | Discharge status: Home / Self Care | Payer: Medicare Other | Source: Ambulatory Visit | Attending: Pulmonary Disease | Admitting: Pulmonary Disease

## 2011-09-27 NOTE — Progress Notes (Signed)
Physical Therapy Discharge Summary  Patient Details  Name: [name] [name] MRN: [id number] Date of Birth: [birth year]  Today's Date: 09/27/2011 Time: 4098-1191 Time Calculation (min): 40 min Charges: 1 MMT, 35' TE Visit#: 14  of    Re-eval: 09/27/11 Assessment Diagnosis: L THR  Past Medical History: No past medical history on file. Past Surgical History: No past surgical history on file.  Subjective Symptoms/Limitations Symptoms: R knee continues to be painful.  She reports about 90% improvement since the surgery.  How long can you sit comfortably?: 1 hour (30 minutes on 07/26/11) How long can you stand comfortably?: 30 minutes (10 minutes 07/26/11) How long can you walk comfortably?: 25 minutes (10 minutes 07/26/11) Pain Assessment Currently in Pain?: Yes Pain Score:   5 Pain Location: Knee Pain Orientation: Right  Assessment RLE Strength Right Hip Flexion: 4/5 Right Hip Extension: 4/5 Right Hip ABduction: 3+/5 Right Hip ADduction: 5/5 LLE Strength Left Hip Flexion: 5/5 Left Hip Extension: 4/5 Left Hip ABduction: 3+/5 Left Hip ADduction: 5/5  Exercise/Treatments Stretches Active Hamstring Stretch: 2 reps;60 seconds Single Knee to Chest Stretch: 2 reps;30 seconds Quad Stretch: 1 rep;30 seconds Piriformis Stretch: 1 rep;30 seconds  Stretches Active Hamstring Stretch: 2 reps;60 seconds Quad Stretch: 1 rep;30 seconds Piriformis Stretch: 1 rep;30 seconds Seated Other Seated Knee Exercises: Seated Roll in and out with heels and toes in seated 10x5 sec hold each direction Sidelying Hip ABduction: Both;5 reps Hip ADduction: Both;5 reps Other Sidelying Knee Exercises: L and R clam x5 Prone  Hip Extension: Both;5 reps Other Prone Exercises: heel squeeze 5x5 sec      Physical Therapy Assessment and Plan PT Assessment and Plan Clinical Impression Statement: [name] [name] has attended 14 OPPT visits and has met 4/4 STG and 3/4 LTG.  She has improved her overall LLE  strength and is limited by her R hamstring pain with functional activities including ascending and descending stairs and continues to cause antalgic gait.  She will continue to benefit from an advanced HEP to decreae RLE pain and improve B LE strength.   PT Plan: D/C with advanced HEP    Goals PT Short Term Goals Time to Complete Short Term Goals: 4 weeks PT Short Term Goal 1: 1.Pt will be Independent in HEP in order to maximize therapeutic effect. PT Short Term Goal 1 - Progress: Met PT Short Term Goal 2: 2.Pt will ambulate x10 minutes independently on the treadmill w/o rest break PT Short Term Goal 2 - Progress: Met PT Short Term Goal 3: 3.Pt will improve static L single leg balance x10 sec. PT Short Term Goal 3 - Progress: Met PT Short Term Goal 4: 4.Pt will improve LE power by demonstrating 5 sit to stands without UE support. PT Short Term Goal 4 - Progress: Met PT Long Term Goals Time to Complete Long Term Goals: 4 weeks PT Long Term Goal 1: 1.Pt will improve LE strength in order to ambulate independently x 30  minutes in order to participate in community exercise. PT Long Term Goal 1 - Progress: Met PT Long Term Goal 2: 2.Pt will improve dynamic balance by demonstrating tandem gait on uneven surface x50 ft with supervision in order to return safely to outdoor activities.  Long Term Goal 3: 3.Pt will improve LE power and ascend/descend 10 stairs with 1 handrail with reciprocal pattern  in order to safely enter family and friends home.  Long Term Goal 3 Progress: Not met Long Term Goal 4: 4.Pt will be independent with  an advanced HEP in order to continue with exercise at the gym.  Long Term Goal 4 Progress: Met  Problem List Patient Active Problem List  Diagnoses  . DYSLIPIDEMIA  . BIPOLAR AFFECTIVE DISORDER  . CAD, UNSPECIFIED SITE  . GERD  . FIBROMYALGIA  . DIZZINESS  . CHEST PAIN-UNSPECIFIED  . Status post THR (total hip replacement)  . Hip pain  . Difficulty in walking     PT - End of Session Activity Tolerance: Patient tolerated treatment well   [name] [name] 09/27/2011, 10:39 AM  Physician Documentation Your signature is required to indicate approval of the treatment plan as stated above.  Please sign and either send electronically or make a copy of this report for your files and return this physician signed original.   Please mark one 1.__approve of plan  2. ___approve of plan with the following conditions.   ______________________________                                                          _____________________ Physician Signature                                                                                                             Date

## 2011-10-02 ENCOUNTER — Ambulatory Visit (HOSPITAL_COMMUNITY): Payer: Medicare Other | Admitting: Physical Therapy

## 2011-10-12 ENCOUNTER — Encounter: Payer: Self-pay | Admitting: *Deleted

## 2011-10-15 ENCOUNTER — Encounter: Payer: Self-pay | Admitting: Internal Medicine

## 2011-10-15 ENCOUNTER — Ambulatory Visit (INDEPENDENT_AMBULATORY_CARE_PROVIDER_SITE_OTHER): Payer: Medicare Other | Admitting: Internal Medicine

## 2011-10-15 VITALS — BP 150/94 | HR 98 | Ht 58.5 in | Wt 134.1 lb

## 2011-10-15 DIAGNOSIS — I10 Essential (primary) hypertension: Secondary | ICD-10-CM

## 2011-10-15 DIAGNOSIS — I2510 Atherosclerotic heart disease of native coronary artery without angina pectoris: Secondary | ICD-10-CM

## 2011-10-15 DIAGNOSIS — E785 Hyperlipidemia, unspecified: Secondary | ICD-10-CM

## 2011-10-15 NOTE — Progress Notes (Signed)
Patient ID: [name] [name], female   DOB: 06/15/1941, 70 y.o.   MRN: [id number] HPIMs. [name] [name] is a 70 year old woman. She has a history of CAD. I last saw her in November 2011. She underwent cardiac catheterization back in 2010.  Tthis showed normal LV function, 20-30% proximal RCA, 50-60% ostial LAD. Overall, this was better than catheterization 6 years prior. Plan was for medical therapy and also cardiac rehabilitation. Since I saw her  Doing ok from a cardiac standpont Had hip replacement.  Did PT after.  NOw having knee problems (right) Gets out of breath easily   Not new or worse.  Thinks it is deconditioning No CP. Lipids in Jan 12.  Showed LDL of 96, HDL of 74. Allergies  Allergen Reactions  . Hydrocodone-Acetaminophen     REACTION: urticaria (hives)    Current Outpatient Prescriptions  Medication Sig Dispense Refill  . aspirin 325 MG tablet Take 325 mg by mouth daily.        . BuPROPion HBr (APLENZIN) 522 MG TB24 Take by mouth.        . DULoxetine (CYMBALTA) 60 MG capsule Take 120 mg by mouth daily.       . ergocalciferol (VITAMIN D2) 50000 UNITS capsule Take 50,000 Units by mouth every 21 ( twenty-one) days.       Marland Kitchen estradiol (ESTRACE) 1 MG tablet Take 1 mg by mouth daily.        . meloxicam (MOBIC) 7.5 MG tablet Take 7.5 mg by mouth daily.        . Multiple Vitamin (MULTIVITAMIN) tablet Take 1 tablet by mouth daily.        . niacin (SLO-NIACIN) 500 MG tablet Take 500 mg by mouth 2 (two) times daily with a meal.        . nitroGLYCERIN (NITROSTAT) 0.4 MG SL tablet Place 0.4 mg under the tongue every 5 (five) minutes as needed.        . Omega-3 Fatty Acids (FISH OIL) 1000 MG CAPS Take by mouth.        . risperiDONE (RISPERDAL) 1 MG tablet Take 1 mg by mouth daily.        . simvastatin (ZOCOR) 40 MG tablet Take 1 tablet (40 mg total) by mouth at bedtime.  30 tablet  12  . vitamin C (ASCORBIC ACID) 500 MG tablet Take 500 mg by mouth daily.          Past Medical History  Diagnosis  Date  . CAD (coronary artery disease)   . Chest pain   . Dizziness   . Dyslipidemia   . GERD (gastroesophageal reflux disease)   . Bipolar affective disorder   . Fibromyalgia   . H/O: hysterectomy     Past Surgical History  Procedure Date  . Colonoscopy 01/29/2007  . [place] bunionectomy     left foot  . Esophagogastroduodenoscopy     followed by colonoscopy with snare polypectomy  . Breast lumpectomy   . Cystectomy     No family history on file.  History   Social History  . Marital Status: Married    Spouse Name: N/A    Number of Children: N/A  . Years of Education: N/A   Occupational History  . unemployed    Social History Main Topics  . Smoking status: Never Smoker   . Smokeless tobacco: Never Used  . Alcohol Use: Yes     3-4 drinks per week  . Drug Use: No  . Sexually Active: Not  on file   Other Topics Concern  . Not on file   Social History Narrative  . No narrative on file    Review of Systems:  All systems reviewed.  They are negative to the above problem except as previously stated.  Vital Signs: BP 150/94  Pulse 98  Ht 4' 10.5" (1.486 m)  Wt 134 lb 1.9 oz (60.836 kg)  BMI 27.55 kg/m2  Physical Exam  Patinet in NAD.  HEENT:  Normocephalic, atraumatic. EOMI, PERRLA.  Neck: JVP is normal. No thyromegaly. No bruits.  Lungs: clear to auscultation. No rales no wheezes.  Heart: Regular rate and rhythm. Normal S1, S2. No S3.   No significant murmurs. PMI not displaced.  Abdomen:  Supple, nontender. Normal bowel sounds. No masses. No hepatomegaly.  Extremities:   Good distal pulses throughout. No lower extremity edema.  Musculoskeletal :moving all extremities.  Neuro:   alert and oriented x3.  CN II-XII grossly intact.  EKG:  SR.  97 bpm.    Assessment and Plan:

## 2011-10-15 NOTE — Patient Instructions (Signed)
Your physician wants you to follow-up in:  12 months.  You will receive a reminder letter in the mail two months in advance. If you don't receive a letter, please call our office to schedule the follow-up appointment.   

## 2011-10-15 NOTE — Assessment & Plan Note (Signed)
BP is high today  I will not add anything  Is due to see E Hawkins soon  If high then she should start med- poss b blocker or ACEI.

## 2011-10-15 NOTE — Assessment & Plan Note (Addendum)
No symptoms to sugg definite angina.  I do think sOB is deconditioning.

## 2011-10-15 NOTE — Assessment & Plan Note (Signed)
Will get lipids checked this month with Dr. Juanetta Gosling.  Asked her to have them faxed here.

## 2012-01-24 ENCOUNTER — Encounter: Payer: Self-pay | Admitting: Internal Medicine

## 2012-02-04 DIAGNOSIS — T84029A Dislocation of unspecified internal joint prosthesis, initial encounter: Secondary | ICD-10-CM | POA: Insufficient documentation

## 2012-02-04 DIAGNOSIS — Z96649 Presence of unspecified artificial hip joint: Secondary | ICD-10-CM | POA: Insufficient documentation

## 2012-06-18 ENCOUNTER — Other Ambulatory Visit: Payer: Self-pay | Admitting: Internal Medicine

## 2012-08-28 DIAGNOSIS — Z471 Aftercare following joint replacement surgery: Secondary | ICD-10-CM | POA: Insufficient documentation

## 2012-08-28 DIAGNOSIS — Z96619 Presence of unspecified artificial shoulder joint: Secondary | ICD-10-CM | POA: Insufficient documentation

## 2012-09-22 ENCOUNTER — Other Ambulatory Visit (HOSPITAL_COMMUNITY): Payer: Self-pay | Admitting: Internal Medicine

## 2012-11-20 ENCOUNTER — Encounter: Payer: Self-pay | Admitting: Internal Medicine

## 2012-11-28 ENCOUNTER — Emergency Department (HOSPITAL_COMMUNITY): Payer: Medicare Other

## 2012-11-28 ENCOUNTER — Observation Stay (HOSPITAL_COMMUNITY)
Admission: EM | Admit: 2012-11-28 | Discharge: 2012-11-29 | Disposition: A | Discharge status: Home / Self Care | Payer: Medicare Other | Attending: Pulmonary Disease | Admitting: Pulmonary Disease

## 2012-11-28 ENCOUNTER — Encounter (HOSPITAL_COMMUNITY): Payer: Self-pay | Admitting: *Deleted

## 2012-11-28 DIAGNOSIS — R42 Dizziness and giddiness: Secondary | ICD-10-CM | POA: Diagnosis present

## 2012-11-28 DIAGNOSIS — M797 Fibromyalgia: Secondary | ICD-10-CM | POA: Diagnosis present

## 2012-11-28 DIAGNOSIS — R7309 Other abnormal glucose: Secondary | ICD-10-CM | POA: Insufficient documentation

## 2012-11-28 DIAGNOSIS — E785 Hyperlipidemia, unspecified: Secondary | ICD-10-CM | POA: Insufficient documentation

## 2012-11-28 DIAGNOSIS — IMO0001 Reserved for inherently not codable concepts without codable children: Secondary | ICD-10-CM | POA: Insufficient documentation

## 2012-11-28 DIAGNOSIS — I10 Essential (primary) hypertension: Secondary | ICD-10-CM | POA: Insufficient documentation

## 2012-11-28 DIAGNOSIS — Z96643 Presence of artificial hip joint, bilateral: Secondary | ICD-10-CM

## 2012-11-28 DIAGNOSIS — R079 Chest pain, unspecified: Principal | ICD-10-CM | POA: Insufficient documentation

## 2012-11-28 DIAGNOSIS — F319 Bipolar disorder, unspecified: Secondary | ICD-10-CM | POA: Diagnosis present

## 2012-11-28 DIAGNOSIS — I2510 Atherosclerotic heart disease of native coronary artery without angina pectoris: Secondary | ICD-10-CM | POA: Diagnosis present

## 2012-11-28 LAB — TROPONIN I
Troponin I: <0.3 ng/mL (ref <0.30)
Troponin I: <0.3 ng/mL (ref <0.30)

## 2012-11-28 LAB — CBC WITH DIFFERENTIAL/PLATELET
Basophils Absolute: 0 10*3/uL (ref 0.0–0.1)
Basophils Relative: 0 % (ref 0–1)
Eosinophils Absolute: 0.1 10*3/uL (ref 0.0–0.7)
Eosinophils Relative: 1 % (ref 0–5)
HCT: 38.2 % (ref 36.0–46.0)
Hemoglobin: 12.7 g/dL (ref 12.0–15.0)
Lymphocytes Relative: 8 % — ABNORMAL LOW (ref 12–46)
Lymphs Abs: 0.8 10*3/uL (ref 0.7–4.0)
MCH: 31.4 pg (ref 26.0–34.0)
MCHC: 33.2 g/dL (ref 30.0–36.0)
MCV: 94.3 fL (ref 78.0–100.0)
Monocytes Absolute: 0.7 10*3/uL (ref 0.1–1.0)
Monocytes Relative: 7 % (ref 3–12)
Neutro Abs: 8.6 10*3/uL — ABNORMAL HIGH (ref 1.7–7.7)
Neutrophils Relative %: 83 % — ABNORMAL HIGH (ref 43–77)
Platelets: 218 10*3/uL (ref 150–400)
RBC: 4.05 MIL/uL (ref 3.87–5.11)
RDW: 13.3 % (ref 11.5–15.5)
WBC: 10.3 10*3/uL (ref 4.0–10.5)

## 2012-11-28 LAB — BASIC METABOLIC PANEL
BUN: 7 mg/dL (ref 6–23)
CO2: 29 mEq/L (ref 19–32)
Calcium: 9.6 mg/dL (ref 8.4–10.5)
Chloride: 97 mEq/L (ref 96–112)
Creatinine, Ser: 0.62 mg/dL (ref 0.50–1.10)
GFR calc Af Amer: >90 mL/min (ref >90)
GFR calc non Af Amer: 90 mL/min — ABNORMAL LOW (ref >90)
Glucose, Bld: 164 mg/dL — ABNORMAL HIGH (ref 70–99)
Potassium: 3.9 mEq/L (ref 3.5–5.1)
Sodium: 135 mEq/L (ref 135–145)

## 2012-11-28 MED ORDER — NITROGLYCERIN 0.4 MG SL SUBL: 0.4000 mg | SUBLINGUAL_TABLET | SUBLINGUAL | Status: DC | PRN

## 2012-11-28 MED ORDER — ASPIRIN 81 MG PO CHEW
81.0000 mg | CHEWABLE_TABLET | Freq: Every day | ORAL | Status: DC
  Filled 2012-11-28: qty 1

## 2012-11-28 MED ORDER — SIMVASTATIN 20 MG PO TABS
40.0000 mg | ORAL_TABLET | Freq: Every day | ORAL | Status: DC
  Administered 2012-11-28: 40 mg via ORAL
  Filled 2012-11-28 (×3): qty 2

## 2012-11-28 MED ORDER — PANTOPRAZOLE SODIUM 40 MG PO TBEC: 40.0000 mg | DELAYED_RELEASE_TABLET | Freq: Every day | ORAL | Status: DC

## 2012-11-28 MED ORDER — LOSARTAN POTASSIUM 50 MG PO TABS
50.0000 mg | ORAL_TABLET | Freq: Every day | ORAL | Status: DC
  Filled 2012-11-28 (×4): qty 1

## 2012-11-28 MED ORDER — MELOXICAM 7.5 MG PO TABS
7.5000 mg | ORAL_TABLET | Freq: Two times a day (BID) | ORAL | Status: DC
  Filled 2012-11-28 (×6): qty 1

## 2012-11-28 MED ORDER — ENOXAPARIN SODIUM 40 MG/0.4ML ~~LOC~~ SOLN
40.0000 mg | SUBCUTANEOUS | Status: DC
  Administered 2012-11-28: 40 mg via SUBCUTANEOUS
  Filled 2012-11-28: qty 0.4

## 2012-11-28 MED ORDER — FENTANYL 50 MCG/HR TD PT72
50.0000 ug | MEDICATED_PATCH | TRANSDERMAL | Status: DC
  Filled 2012-11-28: qty 1

## 2012-11-28 MED ORDER — LAMOTRIGINE 100 MG PO TABS
150.0000 mg | ORAL_TABLET | Freq: Every day | ORAL | Status: DC
  Filled 2012-11-28 (×3): qty 1.5

## 2012-11-28 MED ORDER — SODIUM CHLORIDE 0.9 % IJ SOLN: 3.0000 mL | INTRAMUSCULAR | Status: DC | PRN

## 2012-11-28 MED ORDER — ASPIRIN 81 MG PO CHEW
324.0000 mg | CHEWABLE_TABLET | Freq: Once | ORAL | Status: AC
  Administered 2012-11-28: 324 mg via ORAL
  Filled 2012-11-28: qty 4

## 2012-11-28 MED ORDER — ALUM & MAG HYDROXIDE-SIMETH 200-200-20 MG/5ML PO SUSP: 30.0000 mL | Freq: Four times a day (QID) | ORAL | Status: DC | PRN

## 2012-11-28 MED ORDER — PANTOPRAZOLE SODIUM 40 MG PO TBEC
80.0000 mg | DELAYED_RELEASE_TABLET | Freq: Every day | ORAL | Status: DC
  Filled 2012-11-28 (×2): qty 2

## 2012-11-28 MED ORDER — SODIUM CHLORIDE 0.9 % IJ SOLN: 3.0000 mL | Freq: Two times a day (BID) | INTRAMUSCULAR | Status: DC

## 2012-11-28 MED ORDER — DULOXETINE HCL 60 MG PO CPEP
120.0000 mg | ORAL_CAPSULE | Freq: Every day | ORAL | Status: DC
  Filled 2012-11-28: qty 2

## 2012-11-28 MED ORDER — ACETAMINOPHEN 650 MG RE SUPP: 650.0000 mg | Freq: Four times a day (QID) | RECTAL | Status: DC | PRN

## 2012-11-28 MED ORDER — ONDANSETRON HCL 4 MG/2ML IJ SOLN
4.0000 mg | INTRAMUSCULAR | Status: DC | PRN
  Administered 2012-11-28: 4 mg via INTRAVENOUS
  Filled 2012-11-28: qty 2

## 2012-11-28 MED ORDER — ONDANSETRON HCL 4 MG PO TABS: 4.0000 mg | ORAL_TABLET | Freq: Four times a day (QID) | ORAL | Status: DC | PRN

## 2012-11-28 MED ORDER — SODIUM CHLORIDE 0.9 % IV SOLN: 250.0000 mL | INTRAVENOUS | Status: DC | PRN

## 2012-11-28 MED ORDER — CYCLOBENZAPRINE HCL 10 MG PO TABS
10.0000 mg | ORAL_TABLET | Freq: Three times a day (TID) | ORAL | Status: DC
  Administered 2012-11-28 (×2): 10 mg via ORAL
  Filled 2012-11-28 (×3): qty 1

## 2012-11-28 MED ORDER — ACETAMINOPHEN 325 MG PO TABS: 650.0000 mg | ORAL_TABLET | Freq: Four times a day (QID) | ORAL | Status: DC | PRN

## 2012-11-28 MED ORDER — ONDANSETRON HCL 4 MG/2ML IJ SOLN: 4.0000 mg | Freq: Four times a day (QID) | INTRAMUSCULAR | Status: DC | PRN

## 2012-11-28 NOTE — ED Notes (Signed)
Patient placed on monitor from Room 15 since she is a hallway patient.

## 2012-11-28 NOTE — ED Notes (Signed)
Onset 1 hour ago with nausea, sweats, then pain in neck and lt arm,  Took 2 ntg pta. With relief.

## 2012-11-28 NOTE — ED Notes (Signed)
Ice pack placed to lower back due to chronic pain.

## 2012-11-28 NOTE — ED Notes (Signed)
Pt states pain to left neck and left arm began 1 1/2 hour ago. Pt had 2 nitro's PTA. Pt denies CP. Pain to neck and arm now rated at 4. NAD. Cardiac monitor applied.

## 2012-11-28 NOTE — ED Provider Notes (Signed)
History     CSN: [id number]  Arrival date & time 11/28/12  1213   First MD Initiated Contact with Patient 11/28/12 1310      Chief Complaint  Patient presents with  . Chest Pain     HPI Pt was seen at 1320.   Per pt, c/o gradual onset and improvement of constant chest pain that began approx 11am PTA while she was walking around a store.  States she initially felt nauseated and diaphoretic, followed by the chest "pain."  Describes the CP as "heaviness" and radiating down her left arm and into her left neck.  States she took 2 of her own SL ntg PTA with improvement in the discomfort.  Denies palpitations, no SOB/cough, no abd pain, no vomiting/diarrhea, no fevers, no back pain.     Past Medical History  Diagnosis Date  . CAD (coronary artery disease)   . Chest pain   . Dizziness   . Dyslipidemia   . GERD (gastroesophageal reflux disease)   . Bipolar affective disorder   . Fibromyalgia   . H/O: hysterectomy     Past Surgical History  Procedure Date  . Colonoscopy 01/29/2007  . [place] bunionectomy     left foot  . Esophagogastroduodenoscopy     followed by colonoscopy with snare polypectomy  . Cystectomy   . Breast lumpectomy   . Spinal fusion   . Joint replacement   . Bil hip replacement     History  Substance Use Topics  . Smoking status: Never Smoker   . Smokeless tobacco: Never Used  . Alcohol Use: Yes     Comment: 3-4 drinks per week    Review of Systems ROS: Statement: All systems negative except as marked or noted in the HPI; Constitutional: Negative for fever and chills. ; ; Eyes: Negative for eye pain, redness and discharge. ; ; ENMT: Negative for ear pain, hoarseness, nasal congestion, sinus pressure and sore throat. ; ; Cardiovascular: +CP, diaphoresis. Negative for palpitations, dyspnea and peripheral edema. ; ; Respiratory: Negative for cough, wheezing and stridor. ; ; Gastrointestinal: +nausea. Negative for vomiting, diarrhea, abdominal pain, blood in  stool, hematemesis, jaundice and rectal bleeding. . ; ; Genitourinary: Negative for dysuria, flank pain and hematuria. ; ; Musculoskeletal: Negative for back pain and neck pain. Negative for swelling and trauma.; ; Skin: Negative for pruritus, rash, abrasions, blisters, bruising and skin lesion.; ; Neuro: Negative for headache, lightheadedness and neck stiffness. Negative for weakness, altered level of consciousness , altered mental status, extremity weakness, paresthesias, involuntary movement, seizure and syncope.      Allergies  Hydrocodone-acetaminophen  Home Medications   Current Outpatient Rx  Name  Route  Sig  Dispense  Refill  . ASPIRIN 325 MG PO TABS   Oral   Take 325 mg by mouth daily.           . BUPROPION HBR ER 522 MG PO TB24   Oral   Take by mouth.           . DULOXETINE HCL 60 MG PO CPEP   Oral   Take 120 mg by mouth daily.          . ERGOCALCIFEROL 50000 UNITS PO CAPS   Oral   Take 50,000 Units by mouth every 21 ( twenty-one) days.          Marland Kitchen ESTRADIOL 1 MG PO TABS   Oral   Take 1 mg by mouth daily.           Marland Kitchen  MELOXICAM 7.5 MG PO TABS   Oral   Take 7.5 mg by mouth daily.           Marland Kitchen ONE-DAILY MULTI VITAMINS PO TABS   Oral   Take 1 tablet by mouth daily.           Marland Kitchen NIACIN ER 500 MG PO TBCR   Oral   Take 500 mg by mouth 2 (two) times daily with a meal.           . NITROSTAT 0.4 MG SL SUBL      PLACE ONE TABLET UNDER TONGUE EVERY 5 MIN UP TO 3 DOSES AS NEEDED FORCHEST PAIN.   25 each   3   . FISH OIL 1000 MG PO CAPS   Oral   Take by mouth.           Marland Kitchen RISPERIDONE 1 MG PO TABS   Oral   Take 1 mg by mouth daily.           Marland Kitchen VITAMIN C 500 MG PO TABS   Oral   Take 500 mg by mouth daily.           Marland Kitchen ZOCOR 40 MG PO TABS      TAKE (1) TABLET BY MOUTH AT BEDTIME.   30 tablet   12     BP 127/67  Pulse 95  Temp 98.7 F (37.1 C) (Oral)  Resp 18  Ht 4\' 10"  (1.473 m)  Wt 130 lb (58.968 kg)  BMI 27.17 kg/m2  SpO2  98%  Physical Exam 1325: Physical examination:  Nursing notes reviewed; Vital signs and O2 SAT reviewed;  Constitutional: Well developed, Well nourished, Well hydrated, In no acute distress; Head:  Normocephalic, atraumatic; Eyes: EOMI, PERRL, No scleral icterus; ENMT: Mouth and pharynx normal, Mucous membranes moist; Neck: Supple, Full range of motion, No lymphadenopathy; Cardiovascular: Regular rate and rhythm, No gallop; Respiratory: Breath sounds clear & equal bilaterally, No rales, rhonchi, wheezes.  Speaking full sentences with ease, Normal respiratory effort/excursion; Chest: Nontender, Movement normal; Abdomen: Soft, Nontender, Nondistended, Normal bowel sounds;; Extremities: Pulses normal, No tenderness, No edema, No calf edema or asymmetry.; Neuro: AA&Ox3, Major CN grossly intact.  Speech clear. No gross focal motor or sensory deficits in extremities.; Skin: Color normal, Warm, Dry.   ED Course  Procedures    MDM  MDM Reviewed: previous chart, nursing note and vitals Reviewed previous: labs and ECG Interpretation: labs, ECG and x-ray    Date: 11/28/2012  Rate: 95  Rhythm: normal sinus rhythm  QRS Axis: normal  Intervals: normal  ST/T Wave abnormalities: normal  Conduction Disutrbances:none  Narrative Interpretation:   Old EKG Reviewed: unchanged; no significant changes from previous EKG dated 12/27/2008.   Results for orders placed during the hospital encounter of 11/28/12  CBC WITH DIFFERENTIAL      Component Value Range   WBC 10.3  4.0 - 10.5 K/uL   RBC 4.05  3.87 - 5.11 MIL/uL   Hemoglobin 12.7  12.0 - 15.0 g/dL   HCT 16.1  09.6 - 04.5 %   MCV 94.3  78.0 - 100.0 fL   MCH 31.4  26.0 - 34.0 pg   MCHC 33.2  30.0 - 36.0 g/dL   RDW 40.9  81.1 - 91.4 %   Platelets 218  150 - 400 K/uL   Neutrophils Relative 83 (*) 43 - 77 %   Neutro Abs 8.6 (*) 1.7 - 7.7 K/uL   Lymphocytes Relative 8 (*) 12 -  46 %   Lymphs Abs 0.8  0.7 - 4.0 K/uL   Monocytes Relative 7  3 - 12 %    Monocytes Absolute 0.7  0.1 - 1.0 K/uL   Eosinophils Relative 1  0 - 5 %   Eosinophils Absolute 0.1  0.0 - 0.7 K/uL   Basophils Relative 0  0 - 1 %   Basophils Absolute 0.0  0.0 - 0.1 K/uL  BASIC METABOLIC PANEL      Component Value Range   Sodium 135  135 - 145 mEq/L   Potassium 3.9  3.5 - 5.1 mEq/L   Chloride 97  96 - 112 mEq/L   CO2 29  19 - 32 mEq/L   Glucose, Bld 164 (*) 70 - 99 mg/dL   BUN 7  6 - 23 mg/dL   Creatinine, Ser 2.84  0.50 - 1.10 mg/dL   Calcium 9.6  8.4 - 13.2 mg/dL   GFR calc non Af Amer 90 (*) >90 mL/min   GFR calc Af Amer >90  >90 mL/min  TROPONIN I      Component Value Range   Troponin I <0.30  <0.30 ng/mL   Dg Chest 2 View 11/28/2012  *RADIOLOGY REPORT*  Clinical Data: Chest pain.  Evaluate for infiltrate, CHF, or free air  CHEST - 2 VIEW  Comparison: Chest radiograph 12/24/2008 and chest CT 05/09/2007  Findings: Heart size is upper normal and stable.  Thoracic aorta and hilar contours are within normal limits.  The trachea is midline.  Pulmonary vascularity is normal.  The lungs are well expanded and clear.  There is no edema, focal airspace disease, or pleural effusion.  Negative for pneumothorax.  Left shoulder arthroplasty noted.  Severe degenerative changes of the right glenohumeral joint partially imaged.  Postsurgical changes of the lower lumbar spine, partially imaged.  No acute bony abnormality.  No evidence of free intraperitoneal air.  IMPRESSION: No acute cardiopulmonary disease.   Original Report Authenticated By: Britta Mccreedy, M.D.      1530:  Continues to deny CP while in the ED.  TIMI 3.  Dx and testing d/w pt and family.  Questions answered.  Verb understanding, agreeable to admit. T/C to Dr. Ouida Sills, case discussed, including:  HPI, pertinent PM/SHx, VS/PE, dx testing, ED course and treatment:  Agreeable to observation admit.          Laray Anger, DO 12/01/12 0011

## 2012-11-28 NOTE — ED Notes (Signed)
Pt ambulated to restroom without difficulty with assistance. Bruising noted to right upper arm, older bruise, yellow and purple in color. Pt states she had a mis step and fell at her home a few days ago.

## 2012-11-28 NOTE — ED Notes (Signed)
Dr Fagan at bedside.  

## 2012-11-29 LAB — TROPONIN I
Troponin I: <0.3 ng/mL (ref <0.30)
Troponin I: <0.3 ng/mL (ref <0.30)

## 2012-11-29 NOTE — Progress Notes (Signed)
Patient discharged home.  No new medications or changes made.  Patient refused all meds here- states she will take her own at home.  IV removed - WNL.  Verbalizes understanding of discharge instructions.  No questions.  Stable to discharge

## 2012-11-29 NOTE — Progress Notes (Signed)
Subjective: She was admitted with atypical chest discomfort. She does have a history of coronary artery occlusive disease. She feels well now. She has no more chest pain.  Objective: Vital signs in last 24 hours: Temp:  [97.9 F (36.6 C)-98.7 F (37.1 C)] 97.9 F (36.6 C) (01/18 0556) Pulse Rate:  [95-107] 104  (01/18 0556) Resp:  [16-20] 20  (01/18 0556) BP: (100-133)/(58-74) 116/58 mmHg (01/18 0556) SpO2:  [95 %-99 %] 95 % (01/18 0556) Weight:  [58.968 kg (130 lb)-63.05 kg (139 lb)] 63.05 kg (139 lb) (01/18 0556) Weight change:  Last BM Date: 11/28/12  Intake/Output from previous day:    PHYSICAL EXAM General appearance: alert, cooperative and no distress Resp: clear to auscultation bilaterally Cardio: regular rate and rhythm, S1, S2 normal, no murmur, click, rub or gallop GI: soft, non-tender; bowel sounds normal; no masses,  no organomegaly Extremities: extremities normal, atraumatic, no cyanosis or edema  Lab Results:    Basic Metabolic Panel:  Basename 11/28/12 1258  NA 135  K 3.9  CL 97  CO2 29  GLUCOSE 164*  BUN 7  CREATININE 0.62  CALCIUM 9.6  MG --  PHOS --   Liver Function Tests: No results found for this basename: AST:2,ALT:2,ALKPHOS:2,BILITOT:2,PROT:2,ALBUMIN:2 in the last 72 hours No results found for this basename: LIPASE:2,AMYLASE:2 in the last 72 hours No results found for this basename: AMMONIA:2 in the last 72 hours CBC:  Basename 11/28/12 1258  WBC 10.3  NEUTROABS 8.6*  HGB 12.7  HCT 38.2  MCV 94.3  PLT 218   Cardiac Enzymes:  Basename 11/29/12 0625 11/28/12 2348 11/28/12 1816  CKTOTAL -- -- --  CKMB -- -- --  CKMBINDEX -- -- --  TROPONINI <0.30 <0.30 <0.30   BNP: No results found for this basename: PROBNP:3 in the last 72 hours D-Dimer: No results found for this basename: DDIMER:2 in the last 72 hours CBG: No results found for this basename: GLUCAP:6 in the last 72 hours Hemoglobin A1C: No results found for this basename:  HGBA1C in the last 72 hours Fasting Lipid Panel: No results found for this basename: CHOL,HDL,LDLCALC,TRIG,CHOLHDL,LDLDIRECT in the last 72 hours Thyroid Function Tests: No results found for this basename: TSH,T4TOTAL,FREET4,T3FREE,THYROIDAB in the last 72 hours Anemia Panel: No results found for this basename: VITAMINB12,FOLATE,FERRITIN,TIBC,IRON,RETICCTPCT in the last 72 hours Coagulation: No results found for this basename: LABPROT:2,INR:2 in the last 72 hours Urine Drug Screen: Drugs of Abuse  No results found for this basename: labopia, cocainscrnur, labbenz, amphetmu, thcu, labbarb    Alcohol Level: No results found for this basename: ETH:2 in the last 72 hours Urinalysis: No results found for this basename: COLORURINE:2,APPERANCEUR:2,LABSPEC:2,PHURINE:2,GLUCOSEU:2,HGBUR:2,BILIRUBINUR:2,KETONESUR:2,PROTEINUR:2,UROBILINOGEN:2,NITRITE:2,LEUKOCYTESUR:2 in the last 72 hours Misc. Labs:  ABGS No results found for this basename: PHART,PCO2,PO2ART,TCO2,HCO3 in the last 72 hours CULTURES No results found for this or any previous visit (from the past 240 hour(s)). Studies/Results: Dg Chest 2 View  11/28/2012  *RADIOLOGY REPORT*  Clinical Data: Chest pain.  Evaluate for infiltrate, CHF, or free air  CHEST - 2 VIEW  Comparison: Chest radiograph 12/24/2008 and chest CT 05/09/2007  Findings: Heart size is upper normal and stable.  Thoracic aorta and hilar contours are within normal limits.  The trachea is midline.  Pulmonary vascularity is normal.  The lungs are well expanded and clear.  There is no edema, focal airspace disease, or pleural effusion.  Negative for pneumothorax.  Left shoulder arthroplasty noted.  Severe degenerative changes of the right glenohumeral joint partially imaged.  Postsurgical changes of the  lower lumbar spine, partially imaged.  No acute bony abnormality.  No evidence of free intraperitoneal air.  IMPRESSION: No acute cardiopulmonary disease.   Original Report  Authenticated By: Britta Mccreedy, M.D.     Medications:  Scheduled:   . aspirin  81 mg Oral Daily  . cyclobenzaprine  10 mg Oral TID  . DULoxetine  120 mg Oral Daily  . enoxaparin (LOVENOX) injection  40 mg Subcutaneous Q24H  . fentaNYL  50 mcg Transdermal Q72H  . lamoTRIgine  150 mg Oral QHS  . losartan  50 mg Oral Daily  . meloxicam  7.5 mg Oral BID  . pantoprazole  80 mg Oral Daily  . simvastatin  40 mg Oral QHS  . sodium chloride  3 mL Intravenous Q12H   Continuous:  ZHY:QMVHQI chloride, acetaminophen, acetaminophen, alum & mag hydroxide-simeth, nitroGLYCERIN, ondansetron, ondansetron (ZOFRAN) IV, ondansetron, sodium chloride  Assesment: She was admitted with atypical chest pain. She is improved. Troponin is negative. EKG does not show any acute changes. I think is okay for discharge Active Problems:  * No active hospital problems. *     Plan: Discharge home    LOS: 1 day   [name] [name] L 11/29/2012, 10:22 AM

## 2012-12-01 NOTE — H&P (Signed)
NAME:  [name], [name]                     ACCOUNT NO.:  MEDICAL RECORD NO.:  LOCATION:                                 FACILITY:  PHYSICIAN:  Kingsley Callander. Ouida Sills, MD            DATE OF BIRTH:  DATE OF ADMISSION:  11/28/2012 DATE OF DISCHARGE:  LH                             HISTORY & PHYSICAL   CHIEF COMPLAINT:  Chest pain.  HISTORY OF PRESENT ILLNESS:  This patient is a 72 year old white female, patient of Dr. Juanetta [name] who presented to the hospital after experiencing pressure in her chest with radiation into her left neck and left shoulder and arm while in the grocery store.  She took 2 sublingual nitroglycerin, and continued to have discomfort.  She had shortness of breath associated with it, but no shortness of breath or diaphoresis, but no vomiting.  She was evaluated initially in the emergency room with her initial troponin being negative.  She had experienced pain for approximately an hour.  She has a history of coronary heart disease with 2 prior cardiac catheterizations.  She has not required a previous intervention.  She has risk factors of hyperlipidemia and hypertension. She does not have diabetes.  She does not smoke.  She has family history of premature heart disease in her parents.  She is pain free at the time of my evaluation.  PAST MEDICAL HISTORY: 1. Lumbar disk disease. 2. Bipolar disorder. 3. Fibromyalgia. 4. Hypertension. 5. Hyperlipidemia. 6. Hysterectomy. 7. Bilateral hip replacements. 8. Lumbar disk surgery. 9. Bunionectomies. 10.Breast lumpectomy. 11.GERD.  MEDICATIONS: 1. Duragesic 50 mcg patch q.3 days. 2. Lamictal 150 mg at bedtime. 3. Lidoderm patch 1-3 patches daily for 12 hours. 4. Losartan 50 mg daily. 5. Sublingual nitroglycerin p.r.n. 6. Omeprazole 40 mg daily. 7. Simvastatin 40 mg at bedtime. 8. Aspirin 81 mg daily. 9. Aplenzin  522 mg q.a.m. 10.Cymbalta 120 mg daily. 11.Vitamin D2 50,000 units q.14 days. 12.Estrace 1 mg  daily. 13.Mobic 7.5 mg b.i.d. 14.Multivitamin daily. 15.Niacin 500 mg b.i.d. 16.Fish oil 1000 mg daily. 17.Risperdal 0.5 mg at bedtime. 18.Vitamin C 500 mg daily.  ALLERGIES:  Hydrocodone.  SOCIAL HISTORY:  She does not smoke.  She does not use recreational drugs.  She drinks about 3 drinks a week.  FAMILY HISTORY:  Positive for heart disease, prematurity in both parents.  REVIEW OF SYSTEMS:  Noncontributory.  PHYSICAL EXAMINATION:  VITAL SIGNS:  Temperature 98.7, pulse 95, blood pressure 127/67, respirations 18. GENERAL:  Alert, oriented, and in no distress. HEENT:  She has had right cataract surgery.  No scleral icterus. Nose and pharynx are unremarkable.  NECK:  Appears supple with no JVD or thyromegaly. LUNGS:  Clear. HEART:  Regular with no murmurs. ABDOMEN:  Soft and nontender with no palpable organomegaly. EXTREMITIES:  Normal distal pulses.  No cyanosis, clubbing, or edema. NEURO:  Grossly intact. LYMPH NODES:  No cervical or supraclavicular enlargement. SKIN:  Warm and dry.  LABORATORY DATA:  Troponin I less than 0.30.  Sodium 135, potassium 3.9, bicarb 29, BUN 7, creatinine 0.62, calcium 9.6, glucose 164.  White count 10.3, hemoglobin 12.7, platelets 218,000.  Her  EKG reveals normal sinus rhythm at 95 beats per minute with nonspecific anterior T-wave changes.  Chest x-ray reveals no acute infiltrate.  IMPRESSION/PLAN: 1. Chest pain.  She will be observed in observation overnight.  Serial     cardiac enzymes and repeat EKG will be obtained.  She will be     evaluated further tomorrow by Dr. Juanetta [name].  I am unable to find her     last cardiac catheterization report and she is unclear of the     degree of blockages that she had previously. 2. Hypertension.  Continue losartan. 3. Hyperlipidemia.  Continue simvastatin. 4. Bipolar disorder.  Continue current psychiatric regimen. 5. Fibromyalgia.  We will continue her chronic pain medications. 6. Hyperglycemia.   Recheck.     Kingsley Callander. Ouida Sills, MD     ROF/MEDQ  D:  11/29/2012  T:  11/30/2012  Job:  [id number]

## 2012-12-03 NOTE — Discharge Summary (Signed)
Physician Discharge Summary  Patient ID: [name] [name] MRN: [id number] DOB/AGE: 03-31-1941 72 y.o. Primary Care Physician:Dian Minahan L, MD Admit date: 11/28/2012 Discharge date: 12/03/2012    Discharge Diagnoses:  Chest pain, myocardial infarction ruled out Active Problems:  DYSLIPIDEMIA  BIPOLAR AFFECTIVE DISORDER  CAD, UNSPECIFIED SITE  FIBROMYALGIA  DIZZINESS  CHEST PAIN-UNSPECIFIED  Status post THR (total hip replacement)  Hypertension     Medication List     As of 12/03/2012  5:40 PM    TAKE these medications         APLENZIN 522 MG Tb24   Generic drug: BuPROPion HBr   Take 552 mg by mouth every morning.      aspirin 325 MG tablet   Take 325 mg by mouth daily.      cyclobenzaprine 10 MG tablet   Commonly known as: FLEXERIL   Take 10 mg by mouth 3 (three) times daily.      DULoxetine 60 MG capsule   Commonly known as: CYMBALTA   Take 120 mg by mouth daily.      ergocalciferol 50000 UNITS capsule   Commonly known as: VITAMIN D2   Take 50,000 Units by mouth every 14 (fourteen) days.      estradiol 1 MG tablet   Commonly known as: ESTRACE   Take 1 mg by mouth daily.      fentaNYL 50 MCG/HR   Commonly known as: DURAGESIC - dosed mcg/hr   Place 1 patch onto the skin every 3 (three) days. For PAIN      Fish Oil 1000 MG Caps   Take by mouth.      lamoTRIgine 150 MG tablet   Commonly known as: LAMICTAL   Take 150 mg by mouth at bedtime.      lidocaine 5 %   Commonly known as: LIDODERM   Place 1-3 patches onto the skin daily. Remove & Discard patch within 12 hours or as directed by MD      losartan 50 MG tablet   Commonly known as: COZAAR   Take 50 mg by mouth daily.      meloxicam 7.5 MG tablet   Commonly known as: MOBIC   Take 7.5 mg by mouth 2 (two) times daily.      multivitamin tablet   Take 1 tablet by mouth daily.      niacin 500 MG tablet   Commonly known as: SLO-NIACIN   Take 500 mg by mouth 2 (two) times daily with a meal.     NITROSTAT 0.4 MG SL tablet   Generic drug: nitroGLYCERIN   PLACE ONE TABLET UNDER TONGUE EVERY 5 MIN UP TO 3 DOSES AS NEEDED FORCHEST PAIN.      omeprazole 40 MG capsule   Commonly known as: PRILOSEC   Take 40 mg by mouth daily.      risperiDONE 1 MG tablet   Commonly known as: RISPERDAL   Take 1 mg by mouth at bedtime.      simvastatin 40 MG tablet   Commonly known as: ZOCOR   Take 40 mg by mouth at bedtime.      vitamin C 500 MG tablet   Commonly known as: ASCORBIC ACID   Take 500 mg by mouth daily.        Discharged Condition:improved    Consults:none  Significant Diagnostic Studies: Dg Chest 2 View  11/28/2012  *RADIOLOGY REPORT*  Clinical Data: Chest pain.  Evaluate for infiltrate, CHF, or free air  CHEST - 2  VIEW  Comparison: Chest radiograph 12/24/2008 and chest CT 05/09/2007  Findings: Heart size is upper normal and stable.  Thoracic aorta and hilar contours are within normal limits.  The trachea is midline.  Pulmonary vascularity is normal.  The lungs are well expanded and clear.  There is no edema, focal airspace disease, or pleural effusion.  Negative for pneumothorax.  Left shoulder arthroplasty noted.  Severe degenerative changes of the right glenohumeral joint partially imaged.  Postsurgical changes of the lower lumbar spine, partially imaged.  No acute bony abnormality.  No evidence of free intraperitoneal air.  IMPRESSION: No acute cardiopulmonary disease.   Original Report Authenticated By: Britta Mccreedy, M.D.     Lab Results: Basic Metabolic Panel: No results found for this basename: NA:2,K:2,CL:2,CO2:2,GLUCOSE:2,BUN:2,CREATININE:2,CALCIUM:2,MG:2,PHOS:2 in the last 72 hours Liver Function Tests: No results found for this basename: AST:2,ALT:2,ALKPHOS:2,BILITOT:2,PROT:2,ALBUMIN:2 in the last 72 hours   CBC: No results found for this basename: WBC:2,NEUTROABS:2,HGB:2,HCT:2,MCV:2,PLT:2 in the last 72 hours  No results found for this or any previous visit  (from the past 240 hour(s)).   Hospital Course: she came to the hospital because of a sense of chest tightness and palpitations. She had some discomfort that went up her arm.she was brought in for hospital observation. She ruled out for myocardial infarction. She admitted that she had not eaten and had drank a full pot of coffee before her symptoms started.she was totally asymptomatic after less than 12 hour  Discharge Exam: Blood pressure 116/58, pulse 104, temperature 97.9 F (36.6 C), temperature source Oral, resp. rate 20, height 4\' 10"  (1.473 m), weight 63.05 kg (139 lb), SpO2 95.00%. She is awake and alert.Her chest is clear. Her heart is regular.  Disposition: home. She was instructed to do better with  Her diet and she will followup with her cardiologist      Discharge Orders    Future Orders Please Complete By Expires   Discharge patient           Signed: Fredirick Maudlin Pager [phone]  12/03/2012, 5:40 PM

## 2012-12-16 NOTE — Progress Notes (Signed)
UR Chart Review Completed  

## 2013-02-10 HISTORY — PX: OTHER SURGICAL HISTORY: SHX169

## 2013-03-05 DIAGNOSIS — G894 Chronic pain syndrome: Secondary | ICD-10-CM

## 2013-03-05 DIAGNOSIS — Z8659 Personal history of other mental and behavioral disorders: Secondary | ICD-10-CM | POA: Insufficient documentation

## 2013-03-05 DIAGNOSIS — Z8719 Personal history of other diseases of the digestive system: Secondary | ICD-10-CM

## 2013-03-05 DIAGNOSIS — R232 Flushing: Secondary | ICD-10-CM | POA: Insufficient documentation

## 2013-03-05 DIAGNOSIS — Z419 Encounter for procedure for purposes other than remedying health state, unspecified: Secondary | ICD-10-CM | POA: Insufficient documentation

## 2013-03-05 DIAGNOSIS — M170 Bilateral primary osteoarthritis of knee: Secondary | ICD-10-CM | POA: Insufficient documentation

## 2013-03-05 DIAGNOSIS — Z87898 Personal history of other specified conditions: Secondary | ICD-10-CM | POA: Insufficient documentation

## 2013-03-05 DIAGNOSIS — Z96643 Presence of artificial hip joint, bilateral: Secondary | ICD-10-CM

## 2013-03-05 DIAGNOSIS — R5382 Chronic fatigue, unspecified: Secondary | ICD-10-CM | POA: Insufficient documentation

## 2013-03-05 DIAGNOSIS — M1610 Unilateral primary osteoarthritis, unspecified hip: Secondary | ICD-10-CM | POA: Insufficient documentation

## 2013-03-05 DIAGNOSIS — Z9889 Other specified postprocedural states: Secondary | ICD-10-CM | POA: Insufficient documentation

## 2013-03-05 DIAGNOSIS — G4700 Insomnia, unspecified: Secondary | ICD-10-CM | POA: Insufficient documentation

## 2013-03-05 DIAGNOSIS — E559 Vitamin D deficiency, unspecified: Secondary | ICD-10-CM | POA: Insufficient documentation

## 2013-03-05 HISTORY — DX: Personal history of other diseases of the digestive system: Z87.19

## 2013-03-05 HISTORY — DX: Chronic pain syndrome: G89.4

## 2013-03-05 HISTORY — DX: Personal history of other mental and behavioral disorders: Z86.59

## 2013-03-05 HISTORY — DX: Other specified postprocedural states: Z98.890

## 2013-04-22 ENCOUNTER — Encounter: Payer: Self-pay | Admitting: Certified Nurse Midwife

## 2013-04-27 ENCOUNTER — Ambulatory Visit (HOSPITAL_COMMUNITY)
Admission: RE | Admit: 2013-04-27 | Discharge: 2013-04-27 | Disposition: A | Discharge status: Home / Self Care | Payer: Medicare Other | Source: Ambulatory Visit | Attending: Orthopedic Surgery | Admitting: Orthopedic Surgery

## 2013-04-27 DIAGNOSIS — Z96659 Presence of unspecified artificial knee joint: Secondary | ICD-10-CM | POA: Insufficient documentation

## 2013-04-27 DIAGNOSIS — M25569 Pain in unspecified knee: Secondary | ICD-10-CM | POA: Insufficient documentation

## 2013-04-27 DIAGNOSIS — IMO0001 Reserved for inherently not codable concepts without codable children: Secondary | ICD-10-CM | POA: Insufficient documentation

## 2013-04-27 DIAGNOSIS — I10 Essential (primary) hypertension: Secondary | ICD-10-CM | POA: Insufficient documentation

## 2013-04-27 DIAGNOSIS — M6281 Muscle weakness (generalized): Secondary | ICD-10-CM

## 2013-04-27 DIAGNOSIS — R262 Difficulty in walking, not elsewhere classified: Secondary | ICD-10-CM | POA: Insufficient documentation

## 2013-04-27 HISTORY — DX: Muscle weakness (generalized): M62.81

## 2013-04-27 NOTE — Evaluation (Signed)
Physical Therapy Evaluation  Patient Details  Name: [name] [name] MRN: [id number] Date of Birth: 04/03/1941  Today's Date: 04/27/2013 Time: 4098-1191 PT Time Calculation (min): 43 min Charges: Evaluation: 1145-1215 TE: [id number]             Visit#: 1 of 8  Re-eval: 05/27/13 Assessment Diagnosis: Rt TKR Surgical Date: 03/11/13 Next MD Visit: Dr. Daphine Deutscher - July 17th : Phone: [phone]; Fax: [phone] Prior Therapy: SNF: in [place] (2 weeks), HHPT in [place] (2 weeks)  Authorization: [facility] Medicare    Authorization Time Period:    Authorization Visit#: 1 of 10   Past Medical History:  Past Medical History  Diagnosis Date  . CAD (coronary artery disease)   . Chest pain   . Dizziness   . Dyslipidemia   . GERD (gastroesophageal reflux disease)   . Bipolar affective disorder   . Fibromyalgia   . H/O: hysterectomy    Past Surgical History:  Past Surgical History  Procedure Laterality Date  . Colonoscopy  01/29/2007  . [place] bunionectomy      left foot  . Esophagogastroduodenoscopy      followed by colonoscopy with snare polypectomy  . Cystectomy    . Breast lumpectomy    . Spinal fusion    . Joint replacement    . Bil hip replacement      Subjective Symptoms/Limitations Symptoms: Significant PMH: Bil Hip replacements, fibromyalgia Pertinent History: Pt is a 72 year old female referred to PT s/p Rt TKR on 03/11/13 and has a history of PT after her Lt THR in 2012.  Her c/co is difficulty with balancing and feels she needs to use a SPC inside and a RW when she walks outiside, weakness to BLE and pain to her Rt knee when going from sit to stand and going up and down stairs in her home.   Limitations: Walking;Standing How long can you sit comfortably?: pain with sit to stand How long can you stand comfortably?: 15-20 minutes  How long can you walk comfortably?: 10-15 minutes w/SPC or RW Patient Stated Goals: I want to get my balance back.  Pain Assessment Currently in  Pain?: Yes Pain Score:   6 Pain Location: Knee Pain Orientation: Right Pain Type: Acute pain;Surgical pain Pain Onset: More than a month ago Pain Frequency: Intermittent Pain Relieving Factors: Pain patch  Effect of Pain on Daily Activities: difficulty walking outside, impaired balance  Precautions/Restrictions  Precautions Precautions: Posterior Hip  Balance Screening Balance Screen Has the patient fallen in the past 6 months: Yes How many times?:  (several before surgery, 1 since surgery) Has the patient had a decrease in activity level because of a fear of falling? : Yes Is the patient reluctant to leave their home because of a fear of falling? : No  Prior Functioning  Home Living Lives With: Alone Type of Home: House Home Access: Stairs to enter Entrance Stairs-Number of Steps: 6 Home Layout: Two level Alternate Level Stairs-Number of Steps: 23 Prior Function Able to Take Stairs?: Reciprically Driving: Yes Vocation: Retired Comments: She enjoys walking on TM, walking outside, working in her yard, being with her grandchildren.  Cognition/Observation   Incision has 3 steri strips on without complete closure  Sensation/Coordination/Flexibility/Functional Tests Functional Tests Functional Tests: 5 sit to stands: 17 sec Functional Tests: Activity-specific balance confidence scale (ABC): 66%   RLE AROM (degrees) Right Knee Extension: 0 Right Knee Flexion: 113 RLE Strength Right Hip Flexion: 3+/5 Right Hip Extension: 3+/5 Right Hip ABduction: 3-/5  Right Knee Flexion: 4/5 Right Knee Extension: 4/5 LLE Strength Left Hip Flexion: 3+/5 Left Hip Extension: 3+/5 Left Hip ABduction: 3/5 Left Knee Flexion: 5/5 Left Knee Extension: 5/5 Palpation Palpation: pain and tenderess w/moderate fascial restirctions thorughout scar tissue and Rt distal IT, muscle pain and soreness to distal quadricep  Mobility/Balance  Ambulation/Gait Ambulation/Gait: Yes Assistive device:  Straight cane Gait Pattern: Antalgic;Decreased hip/knee flexion - right;Decreased stance time - right;Right foot flat Static Standing Balance Single Leg Stance - Right Leg: 0 Single Leg Stance - Left Leg: 0 Tandem Stance - Right Leg: 5 Tandem Stance - Left Leg: 5 Rhomberg - Eyes Opened: 10 Rhomberg - Eyes Closed: 10   Exercise/Treatments Standing Heel Raises: 10 reps;Limitations Heel Raises Limitations: no HHA, Toe Raises 10 reps no HHA Partial Tandem Stance: 2x30 seconds Supine Bridges: 5 reps Straight Leg Raises: 5 reps;Limitations Straight Leg Raises Limitations: 3 sec holds Patellar Mobs: demonstrartion and education and scar management demonstration  Physical Therapy Assessment and Plan PT Assessment and Plan Clinical Impression Statement: Pt is a 72 year old female referred to PT s/p Rt knee replacement with significant PMH of fibromyalgia and Bil hip replacements with impairments listed below.  At this time primary focus will be on improving balance and confidence for independent gait as her Rt knee AROM is [facility].  Pt will benefit from skilled therapeutic intervention in order to improve on the following deficits: Decreased balance;Pain;Decreased strength;Difficulty walking;Impaired perceived functional ability;Decreased activity tolerance Rehab Potential: Good PT Frequency: Min 2X/week PT Duration: 6 weeks PT Treatment/Interventions: Gait training;Stair training;Functional mobility training;Therapeutic activities;Therapeutic exercise;Balance training;Neuromuscular re-education;Patient/family education;Manual techniques PT Plan: Please provide pictures of patella mobs. Focus on balance and LE strengthening (squats, heel and toe raises, stair training with UE Assistance) standing on step balance, tandem stance, gait activities.  Manual techniques to decrease fascial restrictions when needed.     Goals Home Exercise Program Pt will Perform Home Exercise Program: Independently PT  Goal: Perform Home Exercise Program - Progress: Goal set today PT Short Term Goals Time to Complete Short Term Goals: 3 weeks PT Short Term Goal 1: Pt will improve LE strength by 1 muscle grade in order to ambulate independently indoors x15 minutes.  PT Short Term Goal 2: Pt will improve her propriooceptive awareness and demonstarte true tandem stance x30 seconds on solid surface.  PT Short Term Goal 3: Pt will report pain less than 4/10 for 50% of her day.  PT Short Term Goal 4: Pt will reduce fascial restrictions to her Rt knee for greater ease with sit to stand and ascending stairs.  PT Long Term Goals Time to Complete Long Term Goals:  (6 weeks) PT Long Term Goal 1: Pt will improve her balance confidence and begin ambulating independently outdoors on solid surfaces in order to return to shopping activities.  PT Long Term Goal 2: Pt will improve her activity tolerance to [facility] in order to ambulate x30 minutes in order to continue with lesiure activities.  Long Term Goal 3: Pt will improve her LE strength to ambulate with improved gait mechanics on a treadmill to return to home treadmill walking for exercise.  Long Term Goal 4: Pt will improve her ABC to greater than 80% for improved percieved fucntional ability.   Problem List Patient Active Problem List   Diagnosis Date Noted  . S/P knee replacement 04/27/2013  . Knee pain 04/27/2013  . Muscle weakness (generalized) 04/27/2013  . Hypertension 10/15/2011  . Status post THR (total hip replacement) 07/26/2011  .  Hip pain 07/26/2011  . Difficulty in walking(719.7) 07/26/2011  . DYSLIPIDEMIA 05/11/2009  . BIPOLAR AFFECTIVE DISORDER 05/11/2009  . CAD, UNSPECIFIED SITE 05/11/2009  . GERD 05/11/2009  . FIBROMYALGIA 05/11/2009  . DIZZINESS 05/11/2009  . CHEST PAIN-UNSPECIFIED 05/11/2009    PT Plan of Care PT Home Exercise Plan: see scanned report PT Patient Instructions: patella mobs, scar tissue massage, discussed POC, importance of  balance and HEP.  Answered questions about diagnosis.  Consulted and Agree with Plan of Care: Patient  GP Functional Assessment Tool Used: ABC: 66% Functional Limitation: Mobility: Walking and moving around Mobility: Walking and Moving Around Current Status (A2130): At least 40 percent but less than 60 percent impaired, limited or restricted Mobility: Walking and Moving Around Goal Status [id number]): At least 1 percent but less than 20 percent impaired, limited or restricted  Anatole Apollo, MPT ATC 04/27/2013, 1:26 PM  Physician Documentation Your signature is required to indicate approval of the treatment plan as stated above.  Please sign and either send electronically or make a copy of this report for your files and return this physician signed original.   Please mark one 1.__approve of plan  2. ___approve of plan with the following conditions.   ______________________________                                                          _____________________ Physician Signature                                                                                                             Date

## 2013-04-29 ENCOUNTER — Ambulatory Visit (HOSPITAL_COMMUNITY)
Admission: RE | Admit: 2013-04-29 | Discharge: 2013-04-29 | Disposition: A | Discharge status: Home / Self Care | Payer: Medicare Other | Source: Ambulatory Visit | Attending: Pulmonary Disease | Admitting: Pulmonary Disease

## 2013-04-29 NOTE — Progress Notes (Signed)
Physical Therapy Treatment Patient Details  Name: [name] [name] MRN: [id number] Date of Birth: 1941-11-01  Today's Date: 04/29/2013 Time: 1023-1102 PT Time Calculation (min): 39 min  Visit#: 1 of 8  Re-eval: 05/27/13 Authorization: [facility] Medicare  Authorization Visit#: 1 of 10  Charges:  therex 38'  Subjective: Symptoms/Limitations Symptoms: Pt. was 8' late for appt.  States her Rt knee is 5/10 today but moving and getting around good. Pain Assessment Currently in Pain?: Yes Pain Score:   5 Pain Location: Knee Pain Orientation: Right   Exercise/Treatments Aerobic Stationary Bike: Nustep, seat 5 level 2 hills #2 X 8 minutes Standing Heel Raises: 15 reps Heel Raises Limitations: no HHA, Toe Raises 15 reps no HHA Lateral Step Up: 10 reps;Right;Step Height: 4";Hand Hold: 1 Forward Step Up: 10 reps;Right;Step Height: 4";Hand Hold: 1 Functional Squat: 10 reps Rocker Board: 2 minutes Supine Short Arc Quad Sets: 10 reps;Both Bridges: 10 reps Straight Leg Raises: 10 reps;Limitations Patellar Mobs: demonstrartion and education and scar management demonstration Sidelying Hip ABduction: 10 reps;Right Prone  Hamstring Curl: 10 reps Hip Extension: 10 reps;Both      Physical Therapy Assessment and Plan PT Assessment and Plan Clinical Impression Statement: Able to complete established HEP/exercises without difficulty today.  Added additional exercises per PT POC.  Pt with good patellar mobility; given written HEP instruction for patellar mobes.  PT Plan: Focus on balance and LE strengthening (squats, heel and toe raises, stair training with UE Assistance) standing on step balance, tandem stance, gait activities.  Manual techniques to decrease fascial restrictions when needed.      Problem List Patient Active Problem List   Diagnosis Date Noted  . S/P knee replacement 04/27/2013  . Knee pain 04/27/2013  . Muscle weakness (generalized) 04/27/2013  . Hypertension 10/15/2011   . Status post THR (total hip replacement) 07/26/2011  . Hip pain 07/26/2011  . Difficulty in walking(719.7) 07/26/2011  . DYSLIPIDEMIA 05/11/2009  . BIPOLAR AFFECTIVE DISORDER 05/11/2009  . CAD, UNSPECIFIED SITE 05/11/2009  . GERD 05/11/2009  . FIBROMYALGIA 05/11/2009  . DIZZINESS 05/11/2009  . CHEST PAIN-UNSPECIFIED 05/11/2009    PT - End of Session Activity Tolerance: Patient tolerated treatment well General Behavior During Therapy: WFL for tasks assessed/performed Cognition: WFL for tasks performed PT Plan of Care PT Home Exercise Plan: patellar mob instruction sheet given   Lurena Nida, PTA/CLT 04/29/2013, 11:51 AM

## 2013-05-04 ENCOUNTER — Ambulatory Visit (HOSPITAL_COMMUNITY)
Admission: RE | Admit: 2013-05-04 | Discharge: 2013-05-04 | Disposition: A | Discharge status: Home / Self Care | Payer: Medicare Other | Source: Ambulatory Visit | Attending: Orthopedic Surgery | Admitting: Orthopedic Surgery

## 2013-05-04 DIAGNOSIS — R262 Difficulty in walking, not elsewhere classified: Secondary | ICD-10-CM

## 2013-05-04 DIAGNOSIS — Z96651 Presence of right artificial knee joint: Secondary | ICD-10-CM

## 2013-05-04 DIAGNOSIS — M25561 Pain in right knee: Secondary | ICD-10-CM

## 2013-05-04 DIAGNOSIS — M6281 Muscle weakness (generalized): Secondary | ICD-10-CM

## 2013-05-04 NOTE — Progress Notes (Signed)
Physical Therapy Treatment Patient Details  Name: [name] [name] MRN: [id number] Date of Birth: [birth year]  Today's Date: 05/04/2013 Time: 4098-1191 (end on Nustep) PT Time Calculation (min): 35 min Charges: Manual: 10:19-1027 TE: [phone] Visit#: 2 of 8  Re-eval: 05/27/13    Authorization: [facility] Medicare  Authorization Time Period:    Authorization Visit#: 2 of 10   Subjective: Symptoms/Limitations Symptoms: Pt continues to have greatest pain when going up steps and going from sit to stand.  C/co of Lt wrist pain.  Pain Assessment Pain Score:   5 Pain Location: Knee Pain Orientation: Right  Precautions/Restrictions     Exercise/Treatments Mobility/Balance        Stretches Active Hamstring Stretch: 1 rep;30 seconds Quad Stretch: 2 reps;30 seconds ITB Stretch: 1 rep;30 seconds Aerobic Stationary Bike: Nustep, seat 4 level 2 hills #1 X 10 minutes Machines for Strengthening   Plyometrics   Standing   Seated   Supine Short Arc Quad Sets: 10 reps;Limitations Short Arc Quad Sets Limitations: 5 sec holds 2# Bridges: 10 reps;Limitations Bridges Limitations: w/hip adduction  Straight Leg Raises: 10 reps;Limitations Straight Leg Raises Limitations: 2# Sidelying Hip ABduction: 10 reps Prone  Hamstring Curl: 10 reps;Limitations Hamstring Curl Limitations: 2# Hip Extension: 15 reps   Manual Therapy Manual Therapy: Myofascial release Myofascial Release: to R lateral knee and distal quadricep to decrease fascial restrictions and scar tissue to improve mobility.   Physical Therapy Assessment and Plan PT Assessment and Plan Clinical Impression Statement: Pt demonstrates improved fascial mobility to her scar tissue.  increased weight today to LE exercises to improve LE strength.  PT Plan: Focus on balance and LE strengthening (squats, heel and toe raises, stair training with UE Assistance) standing on step balance, tandem stance, gait activities.  Manual techniques  to decrease fascial restrictions when needed.     Goals Home Exercise Program Pt will Perform Home Exercise Program: Independently PT Goal: Perform Home Exercise Program - Progress: Met PT Short Term Goals Time to Complete Short Term Goals: 3 weeks PT Short Term Goal 1: Pt will improve LE strength by 1 muscle grade in order to ambulate independently indoors x15 minutes.  PT Short Term Goal 1 - Progress: Progressing toward goal PT Short Term Goal 2: Pt will improve her propriooceptive awareness and demonstarte true tandem stance x30 seconds on solid surface.  PT Short Term Goal 2 - Progress: Progressing toward goal PT Short Term Goal 3: Pt will report pain less than 4/10 for 50% of her day.  PT Short Term Goal 3 - Progress: Progressing toward goal PT Short Term Goal 4: Pt will reduce fascial restrictions to her Rt knee for greater ease with sit to stand and ascending stairs.  PT Short Term Goal 4 - Progress: Progressing toward goal PT Long Term Goals Time to Complete Long Term Goals:  (6 weeks) PT Long Term Goal 1: Pt will improve her balance confidence and begin ambulating independently outdoors on solid surfaces in order to return to shopping activities.  PT Long Term Goal 1 - Progress: Progressing toward goal PT Long Term Goal 2: Pt will improve her activity tolerance to [facility] in order to ambulate x30 minutes in order to continue with lesiure activities.  PT Long Term Goal 2 - Progress: Progressing toward goal Long Term Goal 3: Pt will improve her LE strength to ambulate with improved gait mechanics on a treadmill to return to home treadmill walking for exercise.  Long Term Goal 3 Progress: Progressing toward goal Long  Term Goal 4: Pt will improve her ABC to greater than 80% for improved percieved fucntional ability.  Long Term Goal 4 Progress: Progressing toward goal  Problem List Patient Active Problem List   Diagnosis Date Noted  . S/P knee replacement 04/27/2013  . Knee pain  04/27/2013  . Muscle weakness (generalized) 04/27/2013  . Hypertension 10/15/2011  . Status post THR (total hip replacement) 07/26/2011  . Hip pain 07/26/2011  . Difficulty in walking(719.7) 07/26/2011  . DYSLIPIDEMIA 05/11/2009  . BIPOLAR AFFECTIVE DISORDER 05/11/2009  . CAD, UNSPECIFIED SITE 05/11/2009  . GERD 05/11/2009  . FIBROMYALGIA 05/11/2009  . DIZZINESS 05/11/2009  . CHEST PAIN-UNSPECIFIED 05/11/2009    PT - End of Session Activity Tolerance: Patient tolerated treatment well General Behavior During Therapy: WFL for tasks assessed/performed Cognition: WFL for tasks performed PT Plan of Care PT Home Exercise Plan: patellar mob instruction sheet given Consulted and Agree with Plan of Care: Patient  GP    Adrena Nakamura, MPT ATC 05/04/2013, 10:48 AM

## 2013-05-06 ENCOUNTER — Ambulatory Visit (HOSPITAL_COMMUNITY)
Admission: RE | Admit: 2013-05-06 | Discharge: 2013-05-06 | Disposition: A | Discharge status: Home / Self Care | Payer: Medicare Other | Source: Ambulatory Visit | Attending: Pulmonary Disease | Admitting: Pulmonary Disease

## 2013-05-06 DIAGNOSIS — R262 Difficulty in walking, not elsewhere classified: Secondary | ICD-10-CM

## 2013-05-06 DIAGNOSIS — M6281 Muscle weakness (generalized): Secondary | ICD-10-CM

## 2013-05-06 NOTE — Progress Notes (Addendum)
Physical Therapy Treatment Patient Details  Name: [name] [name] MRN: [id number] Date of Birth: [birth year]  Today's Date: 05/06/2013 Time: 1105-1200 PT Time Calculation (min): 55 min Charge: TE 25' (1105-1115, 1125-1140) Gait 8' [phone]), Manual 10', (1140-1150), Ice x 10 (1150-1200)  Visit#: 3 of 8  Re-eval: 05/27/13 Assessment Diagnosis: Rt TKR Surgical Date: 03/11/13 Next MD Visit: Dr. Daphine Deutscher - July 17th : Phone: [phone]; Fax: [phone] Prior Therapy: SNF: in [place] (2 weeks), HHPT in [place] (2 weeks)  Authorization: [facility] Medicare  Authorization Time Period:    Authorization Visit#: 3 of 10   Subjective: Symptoms/Limitations Symptoms: Pt stated greatest difficulty with stairs, improving functional strength with sit to stand but continues to be limited by pain with movement/ Pain Assessment Currently in Pain?: Yes Pain Score:   4 Pain Location: Knee Pain Orientation: Right  Precautions/Restrictions  Precautions Precautions: Posterior Hip  Exercise/Treatments Stretches Active Hamstring Stretch: 3 reps;30 seconds Aerobic Stationary Bike: Nustep, seat 4 level 2 hills #1 X 10 minutes average SPM 94 Standing Heel Raises: 15 reps Heel Raises Limitations: no HHA, Toe Raises 15 reps no HHA Lateral Step Up: 10 reps;Right;Step Height: 4";Hand Hold: 1 Forward Step Up: 10 reps;Right;Step Height: 4";Hand Hold: 1 Functional Squat: 15 reps Gait Training: Gait training to improve mechanics with cueing for knee extension with heel to toe and equal stance phase  Other Standing Knee Exercises: Tandem stance 2x 30" Supine Short Arc Quad Sets: 15 reps;Limitations Short Arc Quad Sets Limitations: 5 sec holds 2# Bridges: 15 reps;Limitations Bridges Limitations: w/hip adduction  Straight Leg Raises: 15 reps;Limitations Straight Leg Raises Limitations: 2# Patellar Mobs: demonstrated and educted on technique   Modalities Modalities: Cryotherapy Manual Therapy Manual Therapy:  Joint mobilization Joint Mobilization: Patella mobs all directions, Grade II tib/fib Myofascial Release: to R lateral knee and distal quadricep to decrease fascial restrictions and scar tissue to improve mobility Cryotherapy Number Minutes Cryotherapy: 10 Minutes Cryotherapy Location: Knee (Bil knees) Type of Cryotherapy: Ice pack  Physical Therapy Assessment and Plan PT Assessment and Plan Clinical Impression Statement: Progreesed functioan strengthening exercises, gait training and balance activities.  Multimodal cueing to improve gait mechanics with improved mechanics following cueing.  Min assistance with balance activities and vc-ing to improve spatial awareness with less assistance required.  Manual techniques complete to reduce fascial restrictions with improve fascial mobility following.  Ice applied end of session for pain control.  PT Plan: Next session focus on gait, balance and LE strenghening.  Manual techniques to decrease fascial restrictions as needed.    Goals    Problem List Patient Active Problem List   Diagnosis Date Noted  . S/P knee replacement 04/27/2013  . Knee pain 04/27/2013  . Muscle weakness (generalized) 04/27/2013  . Hypertension 10/15/2011  . Status post THR (total hip replacement) 07/26/2011  . Hip pain 07/26/2011  . Difficulty in walking(719.7) 07/26/2011  . DYSLIPIDEMIA 05/11/2009  . BIPOLAR AFFECTIVE DISORDER 05/11/2009  . CAD, UNSPECIFIED SITE 05/11/2009  . GERD 05/11/2009  . FIBROMYALGIA 05/11/2009  . DIZZINESS 05/11/2009  . CHEST PAIN-UNSPECIFIED 05/11/2009    PT - End of Session Activity Tolerance: Patient tolerated treatment well General Behavior During Therapy: [facility] for tasks assessed/performed Cognition: WFL for tasks performed  GP    Juel Burrow 05/06/2013, 12:02 PM

## 2013-05-11 ENCOUNTER — Ambulatory Visit (HOSPITAL_COMMUNITY)
Admission: RE | Admit: 2013-05-11 | Discharge: 2013-05-11 | Disposition: A | Discharge status: Home / Self Care | Payer: Medicare Other | Source: Ambulatory Visit | Attending: Pulmonary Disease | Admitting: Pulmonary Disease

## 2013-05-11 DIAGNOSIS — R262 Difficulty in walking, not elsewhere classified: Secondary | ICD-10-CM

## 2013-05-11 DIAGNOSIS — M6281 Muscle weakness (generalized): Secondary | ICD-10-CM

## 2013-05-11 DIAGNOSIS — Z96651 Presence of right artificial knee joint: Secondary | ICD-10-CM

## 2013-05-11 DIAGNOSIS — M25561 Pain in right knee: Secondary | ICD-10-CM

## 2013-05-11 NOTE — Progress Notes (Signed)
Physical Therapy Treatment Patient Details  Name: [name] [name] MRN: [id number] Date of Birth: 04/11/1941  Today's Date: 05/11/2013 Time: 0935-1040 PT Time Calculation (min): 65 min Manual: 935-948,  TE: [phone],  Estim/Ice: 1025-1040 Visit#: 4 of 8  Re-eval: 05/27/13 Assessment Diagnosis: Rt TKR Surgical Date: 03/11/13 Next MD Visit: Dr. Daphine Deutscher - July 17th : Phone: [phone]; Fax: [phone]  Authorization: [facility] Medicare  Authorization Time Period:    Authorization Visit#: 4 of 10   Subjective: Symptoms/Limitations Symptoms: Pt reports that she played with her grandchildren this weekend.  Making slights gains in strengthening.  Workng on her HEP all day.  Pain Assessment Currently in Pain?: Yes Pain Score:   4 Pain Location: Knee Pain Orientation: Right Pain Type: Acute pain;Surgical pain  Precautions/Restrictions     Exercise/Treatments  Stretches Active Hamstring Stretch: 3 reps;30 seconds ITB Stretch: 3 reps;30 seconds Aerobic Stationary Bike: Nustep, seat 4 level 2 hills #2 X 8 minutes average SPM 86 w/o UE assist Standing Lateral Step Up: Right;10 reps;Hand Hold: 0;Step Height: 2" Forward Step Up: Right;10 reps;Hand Hold: 0;Step Height: 2" Wall Squat: 10 reps Rocker Board: 2 minutes Supine Straight Leg Raises: Right;20 reps Straight Leg Raises Limitations: 1# Sidelying Hip ABduction: Right;2 sets;5 reps;Limitations Hip ABduction Limitations: 1# Prone  Hip Extension: Right;3 sets;5 reps;Limitations Hip Extension Limitations: 1#   Modalities Modalities: Electrical Stimulation Manual Therapy Myofascial Release: to R lateral knee and distal quadricep to decrease fascial restrictions and scar tissue to improve mobility Cryotherapy Number Minutes Cryotherapy: 15 Minutes Cryotherapy Location: Knee Type of Cryotherapy: Ice pack Pharmacologist Location: anterior rt knee Electrical Stimulation Action: IFES Electrical  Stimulation Parameters: x15 minutes Electrical Stimulation Goals: Pain  Physical Therapy Assessment and Plan PT Assessment and Plan Clinical Impression Statement: Pt comes in ambulating without SPC today continues to be limited by pain with antalgic gait.  treatment focused on improving hip and knee strength to improve gait mechanics.  Pt requires mod TC for prone knee flexion to maintain appriopriate postion secondary to weakness to hip IR. Decreased step height to encourage stepping w/o UE  A and requires mod VC and TC for  PT Plan: f/u on estim w/ice, Added vector stance and mini lunges next visit.  Cont with hip strengthening.     Goals    Problem List Patient Active Problem List   Diagnosis Date Noted  . S/P knee replacement 04/27/2013  . Knee pain 04/27/2013  . Muscle weakness (generalized) 04/27/2013  . Hypertension 10/15/2011  . Status post THR (total hip replacement) 07/26/2011  . Hip pain 07/26/2011  . Difficulty in walking(719.7) 07/26/2011  . DYSLIPIDEMIA 05/11/2009  . BIPOLAR AFFECTIVE DISORDER 05/11/2009  . CAD, UNSPECIFIED SITE 05/11/2009  . GERD 05/11/2009  . FIBROMYALGIA 05/11/2009  . DIZZINESS 05/11/2009  . CHEST PAIN-UNSPECIFIED 05/11/2009    PT - End of Session Activity Tolerance: Patient tolerated treatment well General Behavior During Therapy: [facility] for tasks assessed/performed Cognition: WFL for tasks performed  GP    Scarlett Portlock 05/11/2013, 10:30 AM

## 2013-05-13 ENCOUNTER — Ambulatory Visit (HOSPITAL_COMMUNITY)
Admission: RE | Admit: 2013-05-13 | Discharge: 2013-05-13 | Disposition: A | Discharge status: Home / Self Care | Payer: Medicare Other | Source: Ambulatory Visit | Attending: Orthopedic Surgery | Admitting: Orthopedic Surgery

## 2013-05-13 DIAGNOSIS — M25569 Pain in unspecified knee: Secondary | ICD-10-CM | POA: Insufficient documentation

## 2013-05-13 DIAGNOSIS — IMO0001 Reserved for inherently not codable concepts without codable children: Secondary | ICD-10-CM | POA: Insufficient documentation

## 2013-05-13 DIAGNOSIS — I10 Essential (primary) hypertension: Secondary | ICD-10-CM | POA: Insufficient documentation

## 2013-05-13 DIAGNOSIS — R262 Difficulty in walking, not elsewhere classified: Secondary | ICD-10-CM | POA: Insufficient documentation

## 2013-05-13 DIAGNOSIS — M6281 Muscle weakness (generalized): Secondary | ICD-10-CM | POA: Insufficient documentation

## 2013-05-13 DIAGNOSIS — Z96659 Presence of unspecified artificial knee joint: Secondary | ICD-10-CM | POA: Insufficient documentation

## 2013-05-13 NOTE — Progress Notes (Signed)
Physical Therapy Treatment Patient Details  Name: [name] [name] MRN: [id number] Date of Birth: 1941-04-23  Today's Date: 05/13/2013 Time: 1105-1206 PT Time Calculation (min): 61 min  Visit#: 5 of 8  Re-eval: 05/27/13 Authorization: [facility] Medicare  Authorization Visit#: 5 of 10  Charges:  therex 30' (1105-1135), manual 10' (1136-1146), IFES/ice 69' (1148-1204)   Subjective: Symptoms/Limitations Symptoms: Pt states she is hurting all over today; feels it's going to rain.  No specific pain in Rt knee, just overall stiffness.   Exercise/Treatments Aerobic Stationary Bike: Nustep, seat 4 level 2 hills #2 X 8 minutes average SPM 86 w/o UE assist Standing Lateral Step Up: Right;15 reps;Hand Hold: 1;Step Height: 2" Forward Step Up: Right;15 reps;Hand Hold: 1;Step Height: 2" Step Down: Right;10 reps;Step Height: 4";Hand Hold: 1 Wall Squat: 10 reps Rocker Board: 2 minutes Supine Straight Leg Raises: Right;15 reps;Limitations Straight Leg Raises Limitations: 2# Sidelying Hip ABduction: Right;10 reps Hip ABduction Limitations: 1# Prone  Hamstring Curl: 15 reps;Limitations Hamstring Curl Limitations: 2#, AA to keep neutral Hip Extension: Right;15 reps;Limitations Hip Extension Limitations: 2#   Modalities Modalities: Electrical Stimulation;Cryotherapy Manual Therapy Manual Therapy: Myofascial release Myofascial Release: to R lateral knee and distal quadricep to decrease fascial restrictions and scar tissue to improve mobility Cryotherapy Number Minutes Cryotherapy: 15 Minutes Cryotherapy Location: Knee Type of Cryotherapy: Ice pack Pharmacologist Location: anterior rt knee Electrical Stimulation Action: IFES to decrease pain and swelling Electrical Stimulation Parameters: hi/lo sweep, 15 minutes Electrical Stimulation Goals: Pain  Physical Therapy Assessment and Plan PT Assessment and Plan Clinical Impression Statement: Pt with overall  decreasing knee pain and improving ambulation.  Reports IFES/ice helped last session.  Continues to have significant weakness in Rt hip, unable to advance to 2# weight as with other exercises.  Continues to require AA to keep Rt LE in neutal with prone hamstring curls due to weak hip IR.  Added forward step down to increase eccentric quad strength.  Decreased adhesions inferior Rt knee following manual techniques. PT Plan: Continue with  estim w/ice.   Add vector stance,  mini lunges and prone hip IR/ER  next visit.  Cont with hip strengthening.      Problem List Patient Active Problem List   Diagnosis Date Noted  . S/P knee replacement 04/27/2013  . Knee pain 04/27/2013  . Muscle weakness (generalized) 04/27/2013  . Hypertension 10/15/2011  . Status post THR (total hip replacement) 07/26/2011  . Hip pain 07/26/2011  . Difficulty in walking(719.7) 07/26/2011  . DYSLIPIDEMIA 05/11/2009  . BIPOLAR AFFECTIVE DISORDER 05/11/2009  . CAD, UNSPECIFIED SITE 05/11/2009  . GERD 05/11/2009  . FIBROMYALGIA 05/11/2009  . DIZZINESS 05/11/2009  . CHEST PAIN-UNSPECIFIED 05/11/2009    PT - End of Session Activity Tolerance: Patient tolerated treatment well General Behavior During Therapy: [facility] for tasks assessed/performed Cognition: WFL for tasks performed   Lurena Nida, PTA/CLT 05/13/2013, 11:53 AM

## 2013-05-18 ENCOUNTER — Ambulatory Visit (HOSPITAL_COMMUNITY)
Admission: RE | Admit: 2013-05-18 | Discharge: 2013-05-18 | Disposition: A | Discharge status: Home / Self Care | Payer: Medicare Other | Source: Ambulatory Visit | Attending: Pulmonary Disease | Admitting: Pulmonary Disease

## 2013-05-18 NOTE — Progress Notes (Signed)
Physical Therapy Treatment Patient Details  Name: [name] [name] MRN: [id number] Date of Birth: May 29, 1941  Today's Date: 05/18/2013 Time: 1018-1125 PT Time Calculation (min): 67 min  Visit#: 6 of 8  Re-eval: 05/27/13 Authorization: [facility] Medicare  Authorization Visit#: 6 of 10  Charges:  Therex 28' 7829-5621,  Manual 12' 1048-1100,  Estim/ice 15'  1105-1120  Subjective: Symptoms/Limitations Symptoms: Pt reports pain of 4/10 today in her Rt knee. Pain Assessment Currently in Pain?: Yes Pain Score: 4  Pain Location: Knee Pain Orientation: Right   Exercise/Treatments Aerobic Stationary Bike: Nustep, seat 4 level 2 hills #2 X 8 minutes average SPM 86 w/o UE assist Standing Lateral Step Up: Right;15 reps;Step Height: 4";Hand Hold: 1 Forward Step Up: Right;15 reps;Step Height: 4";Hand Hold: 1 Step Down: Right;10 reps;Step Height: 4";Hand Hold: 1 Rocker Board: 2 minutes Seated   Supine Straight Leg Raises: Right;15 reps;Limitations Straight Leg Raises Limitations: 2# Sidelying Hip ABduction: Right;15 reps Hip ABduction Limitations: 0# Prone  Hamstring Curl: 15 reps;Limitations Hamstring Curl Limitations: 2#, AA to keep neutral Hip Extension: Right;15 reps;Limitations Hip Extension Limitations: 2#   Modalities Modalities: Electrical Stimulation;Cryotherapy Manual Therapy Manual Therapy: Myofascial release Myofascial Release: to R lateral knee and distal quadricep to decrease fascial restrictions and scar tissue to improve mobility Cryotherapy Number Minutes Cryotherapy: 15 Minutes Cryotherapy Location: Knee Type of Cryotherapy: Ice pack Pharmacologist Location: anterior rt knee Electrical Stimulation Action: IFES to decrease pain and swelling Electrical Stimulation Parameters: hi/lo sweep 15 minutes Electrical Stimulation Goals: Pain  Physical Therapy Assessment and Plan PT Assessment and Plan Assessment:  Pt with increased pain  today following NuStep; encouraged pt to inform therapist if activity was increasing pain.  Pt with decreased adhesions today noted with manual; pt reported working more on her knee at home to loosen tissue.  Pt with overall reduction in pain at end of session. PT Plan: Continue with  estim w/ice.   Add vector stance,  mini lunges and prone hip IR/ER  next visit.  Cont with hip strengthening.      Problem List Patient Active Problem List   Diagnosis Date Noted  . S/P knee replacement 04/27/2013  . Knee pain 04/27/2013  . Muscle weakness (generalized) 04/27/2013  . Hypertension 10/15/2011  . Status post THR (total hip replacement) 07/26/2011  . Hip pain 07/26/2011  . Difficulty in walking(719.7) 07/26/2011  . DYSLIPIDEMIA 05/11/2009  . BIPOLAR AFFECTIVE DISORDER 05/11/2009  . CAD, UNSPECIFIED SITE 05/11/2009  . GERD 05/11/2009  . FIBROMYALGIA 05/11/2009  . DIZZINESS 05/11/2009  . CHEST PAIN-UNSPECIFIED 05/11/2009    PT - End of Session Activity Tolerance: Patient tolerated treatment well General Behavior During Therapy: [facility] for tasks assessed/performed Cognition: WFL for tasks performed   Lurena Nida, PTA/CLT 05/18/2013, 11:15 AM

## 2013-05-20 ENCOUNTER — Ambulatory Visit (HOSPITAL_COMMUNITY)
Admission: RE | Admit: 2013-05-20 | Discharge: 2013-05-20 | Disposition: A | Discharge status: Home / Self Care | Payer: Medicare Other | Source: Ambulatory Visit | Attending: Pulmonary Disease | Admitting: Pulmonary Disease

## 2013-05-20 DIAGNOSIS — M6281 Muscle weakness (generalized): Secondary | ICD-10-CM

## 2013-05-20 DIAGNOSIS — R262 Difficulty in walking, not elsewhere classified: Secondary | ICD-10-CM

## 2013-05-20 NOTE — Progress Notes (Signed)
Physical Therapy Treatment Patient Details  Name: [name] [name] MRN: [id number] Date of Birth: Apr 15, 1941  Today's Date: 05/20/2013 Time: 4132-4401 PT Time Calculation (min): 69 min Charge: TE 42' 1058-1140, Manual 10' 1141-1150, Estim 15' 1151-1206, Ice x 1  Visit#: 7 of 8  Re-eval: 05/27/13 Assessment Diagnosis: Rt TKR Surgical Date: 03/11/13 Next MD Visit: Dr. Daphine Deutscher - July 17th : Phone: [phone]; Fax: [phone] Prior Therapy: SNF: in [place] (2 weeks), HHPT in [place] (2 weeks)  Authorization: [facility] Medicare  Authorization Time Period:    Authorization Visit#: 7 of 10   Subjective: Symptoms/Limitations Symptoms: Pt reported knee is feeling good today, her Lt knee is bothering her today. Pain Assessment Currently in Pain?: No/denies  Precautions/Restrictions  Precautions Precautions: Posterior Hip  Exercise/Treatments Aerobic Stationary Bike: Nustep, seat 4 level  hills #3 X 10 minutes average SPM  Standing Forward Lunges: Both;10 reps;3 seconds Lateral Step Up: Right;15 reps;Step Height: 4";Hand Hold: 1 Forward Step Up: Right;15 reps;Step Height: 4";Hand Hold: 1 Step Down: Right;15 reps;Hand Hold: 1;Step Height: 4" Rocker Board: 2 minutes SLS with Vectors: 2x 5" with 1 finger assistance Other Standing Knee Exercises: Tandem stance 3x 30" Sidelying Hip ABduction: Both;15 reps Hip ABduction Limitations: 0# Clams: Bil LE 5x 10" Prone  Hamstring Curl: 15 reps;Limitations Hamstring Curl Limitations: 2# cueing to reduce IR/ER with ham curl Hip Extension: Both;15 reps;Limitations Hip Extension Limitations: 2# Other Prone Exercises: hip IR/ER 15x each LE   Modalities Modalities: Electrical Stimulation;Cryotherapy Manual Therapy Manual Therapy: Myofascial release Myofascial Release: to R lateral knee and distal quadricep to decrease fascial restrictions and scar tissue to improve mobility Cryotherapy Number Minutes Cryotherapy: 15 Minutes Cryotherapy Location:  Knee (Bil knee) Type of Cryotherapy: Ice pack Pharmacologist Location: anterior rt knee Electrical Stimulation Action: IFES to decrease pain and swelling Electrical Stimulation Parameters: hi/lo sweep with LE elevated and ice x 15 minutes Electrical Stimulation Goals: Pain  Physical Therapy Assessment and Plan PT Assessment and Plan Clinical Impression Statement: Pt continue to demonstrate significant weakness Bil hip.  Added exercises for hip strengthening, stabilty and balance with cueing for form and to utilize correct musculautre.  Manual techniques complete to reduce fascial restrictions lateral Rt knee and scar tissue massage to decrease adhesions.  Ended session with estim (Rt knee) and ice (Bil LE) for pain and edema control.   PT Plan: Re-assess next session.      Goals Home Exercise Program Pt/caregiver will Perform Home Exercise Program: increased ROM  Problem List Patient Active Problem List   Diagnosis Date Noted  . S/P knee replacement 04/27/2013  . Knee pain 04/27/2013  . Muscle weakness (generalized) 04/27/2013  . Hypertension 10/15/2011  . Status post THR (total hip replacement) 07/26/2011  . Hip pain 07/26/2011  . Difficulty in walking(719.7) 07/26/2011  . DYSLIPIDEMIA 05/11/2009  . BIPOLAR AFFECTIVE DISORDER 05/11/2009  . CAD, UNSPECIFIED SITE 05/11/2009  . GERD 05/11/2009  . FIBROMYALGIA 05/11/2009  . DIZZINESS 05/11/2009  . CHEST PAIN-UNSPECIFIED 05/11/2009    PT - End of Session Activity Tolerance: Patient tolerated treatment well;Patient limited by fatigue General Behavior During Therapy: [facility] for tasks assessed/performed Cognition: WFL for tasks performed  GP    Juel Burrow 05/20/2013, 12:09 PM

## 2013-05-25 ENCOUNTER — Ambulatory Visit (HOSPITAL_COMMUNITY)
Admission: RE | Admit: 2013-05-25 | Discharge: 2013-05-25 | Disposition: A | Discharge status: Home / Self Care | Payer: Medicare Other | Source: Ambulatory Visit | Attending: Pulmonary Disease | Admitting: Pulmonary Disease

## 2013-05-25 DIAGNOSIS — M6281 Muscle weakness (generalized): Secondary | ICD-10-CM

## 2013-05-25 DIAGNOSIS — M25561 Pain in right knee: Secondary | ICD-10-CM

## 2013-05-25 DIAGNOSIS — Z96651 Presence of right artificial knee joint: Secondary | ICD-10-CM

## 2013-05-25 DIAGNOSIS — R262 Difficulty in walking, not elsewhere classified: Secondary | ICD-10-CM

## 2013-05-25 NOTE — Evaluation (Addendum)
Physical Therapy re-evaluation  Patient Details  Name: [name] [name] MRN: [id number] Date of Birth: 08-12-1941  Today's Date: 05/25/2013 Time: 4098-1191 PT Time Calculation (min): 63 min Charges: MMT/ROM: 930-935 TE: [phone] Self Care: 1000-1015 Estim/Ice: 1015-1033             Visit#: 8 of 16  Re-eval: 06/24/13 Assessment Diagnosis: Rt TKR Surgical Date: 03/11/13 Next MD Visit: Dr. Daphine Deutscher - July 17th : Phone: [phone]; Fax: [phone]  Authorization: [facility] Medicare    Authorization Time Period:    Authorization Visit#: 8 of 18   Subjective Symptoms/Limitations Symptoms: Pt reports that overall she is doing pretty well.  She woke up with morning with burning down her rt leg which she believes is coming from her back. States she is doing her exercies about every other day 1-2x/day How long can you sit comfortably?: decreased pain with sit to stand How long can you stand comfortably?: 30 minutes (was 15-20 minutes)  How long can you walk comfortably?: 30 minutes w/SPC and grocery cart  (was 10-15 minutes w/SPC or RW) Pain Assessment Currently in Pain?: Yes Pain Score: 3  Pain Location: Knee Pain Orientation: Right  Precautions/Restrictions  Precautions Precautions: Posterior Hip  Sensation/Coordination/Flexibility/Functional Tests Functional Tests Functional Tests: Activity-specific balance confidence scale (ABC):78.75% ( was  66%)  Assessment RLE AROM (degrees) Right Knee Extension: 0 (was 0) Right Knee Flexion:  120 (was 113) RLE Strength Right Hip Flexion:  (4+/5, was 3+/5) Right Hip Extension: 4/5 (was 3+/5) Right Hip ABduction: 3/5 (was 3-/5) Right Knee Flexion: 5/5 (was 4/5) Right Knee Extension: 5/5 (was 4/5) LLE Strength Left Hip Flexion: 4/5 (was 3+/5) Left Hip Extension: 4/5 (was 3+/5) Left Hip ABduction: 3+/5 (was 3/5) Left Knee Flexion: 5/5 (was 5/5) Left Knee Extension: 5/5 (was 5/5)  Exercise/Treatments Mobility/Balance   Ambulation/Gait Ambulation/Gait: Yes Ambulation Distance (Feet): 725 Feet (independent 6 min) Assistive device: Straight cane Gait Pattern: Antalgic Static Standing Balance Single Leg Stance - Right Leg: 3 (was 0) Single Leg Stance - Left Leg: 3 (was 0) Tandem Stance - Right Leg: 30 (was 5) Tandem Stance - Left Leg: 30 (was 5)   Aerobic Stationary Bike: Nustep, seat 4 level  hills #2, resistance 3,  X 8 minutes average SPM  Sidelying Hip ABduction: Both;15 reps Hip ABduction Limitations: 0# Prone  Hip Extension: Both;15 reps;Limitations  Modalities Modalities: Electrical Stimulation;Cryotherapy Manual Therapy Manual Therapy: Myofascial release Cryotherapy Number Minutes Cryotherapy: 15 Minutes Cryotherapy Location: Knee (Bil knee) Type of Cryotherapy: Ice pack Pharmacologist Location: anterior rt knee Electrical Stimulation Goals: Pain  Physical Therapy Assessment and Plan PT Assessment and Plan Clinical Impression Statement: [name] [name] has attended 8 OP PT visits s/p Rt TKR over 4 weeks with the following findings: has improved her overall BLE strength by 1 muscle grade, improved activity tolerance, ambulating with a SPC in community, independent ambulating indoors.  Is limiting her oral pain medication, continues to use pain patch to control pain.  Pt will continue to benefit with focus on improving gait mechanics, improve Rt hip strength and confidence with independent ambulation.   Pt will benefit from skilled therapeutic intervention in order to improve on the following deficits: Abnormal gait;Decreased strength;Difficulty walking PT Frequency: Min 2X/week PT Duration: 4 weeks PT Plan: Continued to focus on improving Rt hip strength to decrease Trendlenberg gait.  Improve activity tolerance and continue to increase resistance on aerobic machines.     Goals Home Exercise Program Pt/caregiver will Perform Home Exercise Program:  Independently PT Goal: Perform Home Exercise Program - Progress: Met PT Short Term Goals Time to Complete Short Term Goals: 3 weeks PT Short Term Goal 1: Pt will improve LE strength by 1 muscle grade in order to ambulate independently indoors x15 minutes.  PT Short Term Goal 1 - Progress: Partly met PT Short Term Goal 2: Pt will improve her propriooceptive awareness and demonstarte true tandem stance x30 seconds on solid surface.  PT Short Term Goal 2 - Progress: Met PT Short Term Goal 3: Pt will report pain less than 4/10 for 50% of her day.  PT Short Term Goal 3 - Progress: Met PT Short Term Goal 4: Pt will reduce fascial restrictions to her Rt knee for greater ease with sit to stand and ascending stairs.  PT Short Term Goal 4 - Progress: Met PT Long Term Goals Time to Complete Long Term Goals:  (6 weeks) PT Long Term Goal 1: Pt will improve her balance confidence and begin ambulating independently outdoors on solid surfaces in order to return to shopping activities.  PT Long Term Goal 1 - Progress: Progressing toward goal PT Long Term Goal 2: Pt will improve her activity tolerance to [facility] in order to ambulate x30 minutes in order to continue with lesiure activities.  (6 minutes) PT Long Term Goal 2 - Progress: Progressing toward goal Long Term Goal 3: Pt will improve her LE strength to ambulate with improved gait mechanics on a treadmill to return to [facility] treadmill walking for exercise.  Long Term Goal 3 Progress: Progressing toward goal Long Term Goal 4: Pt will improve her ABC to greater than 80% for improved percieved fucntional ability.  (78.75%) Long Term Goal 4 Progress: Progressing toward goal  Problem List Patient Active Problem List   Diagnosis Date Noted  . S/P knee replacement 04/27/2013  . Knee pain 04/27/2013  . Muscle weakness (generalized) 04/27/2013  . Hypertension 10/15/2011  . Status post THR (total hip replacement) 07/26/2011  . Hip pain 07/26/2011  . Difficulty  in walking(719.7) 07/26/2011  . DYSLIPIDEMIA 05/11/2009  . BIPOLAR AFFECTIVE DISORDER 05/11/2009  . CAD, UNSPECIFIED SITE 05/11/2009  . GERD 05/11/2009  . FIBROMYALGIA 05/11/2009  . DIZZINESS 05/11/2009  . CHEST PAIN-UNSPECIFIED 05/11/2009    PT - End of Session Activity Tolerance: Patient tolerated treatment well;Patient limited by fatigue General Behavior During Therapy: [facility] for tasks assessed/performed Cognition: WFL for tasks performed PT Plan of Care PT Patient Instructions: discussed current POC, ABC score and answered questions.  Consulted and Agree with Plan of Care: Patient  GP Functional Assessment Tool Used: ABC Functional Limitation: Mobility: Walking and moving around Mobility: Walking and Moving Around Current Status ([id number]): At least 20 percent but less than 40 percent impaired, limited or restricted Mobility: Walking and Moving Around Goal Status [phone]): At least 1 percent but less than 20 percent impaired, limited or restricted  Lesly Joslyn, MPT, ATC 05/25/2013, 10:27 AM  Physician Documentation Your signature is required to indicate approval of the treatment plan as stated above.  Please sign and either send electronically or make a copy of this report for your files and return this physician signed original.   Please mark one 1.__approve of plan  2. ___approve of plan with the following conditions.   ______________________________  _____________________ Physician Signature                                                                                                             Date

## 2013-05-27 ENCOUNTER — Ambulatory Visit (HOSPITAL_COMMUNITY): Payer: Medicare Other

## 2013-06-01 ENCOUNTER — Ambulatory Visit (HOSPITAL_COMMUNITY)
Admission: RE | Admit: 2013-06-01 | Discharge: 2013-06-01 | Disposition: A | Discharge status: Home / Self Care | Payer: Medicare Other | Source: Ambulatory Visit | Attending: Pulmonary Disease | Admitting: Pulmonary Disease

## 2013-06-01 DIAGNOSIS — M6281 Muscle weakness (generalized): Secondary | ICD-10-CM

## 2013-06-01 DIAGNOSIS — R262 Difficulty in walking, not elsewhere classified: Secondary | ICD-10-CM

## 2013-06-01 NOTE — Progress Notes (Signed)
Physical Therapy Treatment Patient Details  Name: [name] [name] MRN: [id number] Date of Birth: [birth year]  Today's Date: 06/01/2013 Time: 1478-2956 PT Time Calculation (min): 60 min Charge: TE 40' 2130-8657', Estim 8469-6295  Visit#: 9 of 16  Re-eval: 06/24/13 Assessment Diagnosis: Rt TKR Surgical Date: 03/11/13 Next MD Visit: Dr. Daphine Deutscher -  : Phone: [phone]; Fax: [phone] Prior Therapy: SNF: in [place] (2 weeks), HHPT in [place] (2 weeks)  Authorization: [facility] Medicare  Authorization Time Period:    Authorization Visit#: 9 of 18   Subjective: Symptoms/Limitations Symptoms: Pt reported greatest difficutly continues to be walking straight.  Pain scale 3/10 Rt knee and hip.  Pt reportes MD happy with progress. Pain Assessment Currently in Pain?: Yes Pain Score: 3  Pain Location: Leg Pain Orientation: Right  Precautions/Restrictions  Precautions Precautions: Posterior Hip  Exercise/Treatments Aerobic Stationary Bike: Nustep, seat 4 level  hills #3, resistance 3,  X 10 minutes average SPM  Standing SLS with Vectors: 3x 5" with HHA Other Standing Knee Exercises: side stepping, retro gait 2RT Other Standing Knee Exercises: tandem gait 1RT, tandem gait on balance beam 1Rt with min assit Supine Straight Leg Raises: Right;15 reps;Limitations Straight Leg Raises Limitations: 2# Sidelying Hip ABduction: Both;15 reps Clams: Bil LE 10x 10"   Modalities Modalities: Electrical Stimulation;Cryotherapy Cryotherapy Number Minutes Cryotherapy: 15 Minutes Cryotherapy Location: Hip;Knee Type of Cryotherapy: Ice pack Pharmacologist Location: anterior rt knee Electrical Stimulation Action: IFES to decrease pain and swelling Electrical Stimulation Parameters: hi/lo sweep with LE elevated and ice x 15 minutes Electrical Stimulation Goals: Pain  Physical Therapy Assessment and Plan PT Assessment and Plan Clinical Impression Statement: Session focus  on dynamic balance activties to improve gait mechanics and therex for hip strengthening.  Pt continues to have significant weakness Bil hip musculature.  Session ended with estim and ice for Rt knee and hip for pain control PT Plan: Continued to focus on improving Rt hip strength to decrease Trendlenberg gait.  Improve activity tolerance and continue to increase resistance on aerobic machines.     Goals    Problem List Patient Active Problem List   Diagnosis Date Noted  . S/P knee replacement 04/27/2013  . Knee pain 04/27/2013  . Muscle weakness (generalized) 04/27/2013  . Hypertension 10/15/2011  . Status post THR (total hip replacement) 07/26/2011  . Hip pain 07/26/2011  . Difficulty in walking(719.7) 07/26/2011  . DYSLIPIDEMIA 05/11/2009  . BIPOLAR AFFECTIVE DISORDER 05/11/2009  . CAD, UNSPECIFIED SITE 05/11/2009  . GERD 05/11/2009  . FIBROMYALGIA 05/11/2009  . DIZZINESS 05/11/2009  . CHEST PAIN-UNSPECIFIED 05/11/2009    PT - End of Session Activity Tolerance: Patient tolerated treatment well General Behavior During Therapy: [facility] for tasks assessed/performed Cognition: WFL for tasks performed  GP    Juel Burrow 06/01/2013, 11:54 AM

## 2013-06-03 ENCOUNTER — Ambulatory Visit (HOSPITAL_COMMUNITY)
Admission: RE | Admit: 2013-06-03 | Discharge: 2013-06-03 | Disposition: A | Discharge status: Home / Self Care | Payer: Medicare Other | Source: Ambulatory Visit | Attending: Pulmonary Disease | Admitting: Pulmonary Disease

## 2013-06-03 DIAGNOSIS — M6281 Muscle weakness (generalized): Secondary | ICD-10-CM

## 2013-06-03 DIAGNOSIS — Z96651 Presence of right artificial knee joint: Secondary | ICD-10-CM

## 2013-06-03 DIAGNOSIS — M25561 Pain in right knee: Secondary | ICD-10-CM

## 2013-06-03 DIAGNOSIS — R262 Difficulty in walking, not elsewhere classified: Secondary | ICD-10-CM

## 2013-06-03 NOTE — Progress Notes (Signed)
Physical Therapy Treatment Patient Details  Name: [name] [name] MRN: [id number] Date of Birth: 1940/11/24  Today's Date: 06/03/2013 Time: 1300-1350 PT Time Calculation (min): 50 min Charges: TE: 1300-1340 Ice: 1340-1350 Visit#: 10 of 16  Re-eval: 06/24/13 Assessment Diagnosis: Rt TKR Surgical Date: 03/11/13 Next MD Visit: Dr. Daphine Deutscher -  : Phone: [phone]; Fax: [phone]  Authorization: [facility] Medicare  Authorization Time Period:    Authorization Visit#: 10 of 18   Subjective: Symptoms/Limitations Symptoms: Pt reports that she is working on her Lt leg strength at home.   Pain Assessment Currently in Pain?: Yes Pain Score: 3  Pain Location: Leg  Precautions/Restrictions     Exercise/Treatments Mobility/Balance        Stretches   Aerobic Stationary Bike: Nustep, seat 4 level  hills #2, resistance 2,  X 10 minutes average SPM w/o UE assist Machines for Strengthening Cybex Knee Extension: 1 PL 2x10 Cybex Knee Flexion: 2 PL 2x10 Plyometrics   Standing Lateral Step Up: Limitations;Step Height: 6" Lateral Step Up Limitations: Hip Hikes Rocker Board: 3 minutes SLS with Vectors: 3x 5" with HHA Other Standing Knee Exercises: side stepping, retro gait 2RT Other Standing Knee Exercises: tandem gait 2RT, tandem gait on balance beam  2 RT with min assit Seated   Supine   Sidelying   Prone         Physical Therapy Assessment and Plan PT Assessment and Plan Clinical Impression Statement: Added weight machines today to improve LE strength.  has notable improvement in Trendelenberg gait.  Reports fatigue and dizziness at end of session and request ice.  PT Plan: Continued to focus on improving Rt hip strength to decrease Trendlenberg gait.  Improve activity tolerance and continue to increase resistance on aerobic machines.     Goals    Problem List Patient Active Problem List   Diagnosis Date Noted  . S/P knee replacement 04/27/2013  . Knee pain 04/27/2013  .  Muscle weakness (generalized) 04/27/2013  . Hypertension 10/15/2011  . Status post THR (total hip replacement) 07/26/2011  . Hip pain 07/26/2011  . Difficulty in walking(719.7) 07/26/2011  . DYSLIPIDEMIA 05/11/2009  . BIPOLAR AFFECTIVE DISORDER 05/11/2009  . CAD, UNSPECIFIED SITE 05/11/2009  . GERD 05/11/2009  . FIBROMYALGIA 05/11/2009  . DIZZINESS 05/11/2009  . CHEST PAIN-UNSPECIFIED 05/11/2009    PT - End of Session Activity Tolerance: Patient tolerated treatment well General Behavior During Therapy: [facility] for tasks assessed/performed Cognition: WFL for tasks performed  GP    [name] [name] 06/03/2013, 1:51 PM

## 2013-06-08 ENCOUNTER — Ambulatory Visit (HOSPITAL_COMMUNITY)
Admission: RE | Admit: 2013-06-08 | Discharge: 2013-06-08 | Disposition: A | Discharge status: Home / Self Care | Payer: Medicare Other | Source: Ambulatory Visit | Attending: Pulmonary Disease | Admitting: Pulmonary Disease

## 2013-06-08 NOTE — Progress Notes (Signed)
Physical Therapy Treatment Patient Details  Name: [name] [name] MRN: [id number] Date of Birth: [birth year]  Today's Date: 06/08/2013 Time: 8295-6213 PT Time Calculation (min): 42 min  Visit#: 11 of 16  Re-eval: 06/24/13 Authorization: [employer] Medicare  Authorization Visit#: 11 of 18  Charges:  therex 40'  Subjective: Symptoms/Limitations Symptoms: Pt. states she aches all over today.  Currently 2/10 in her RT knee.  States most pain is in the Rt side of her neck.  States she plans on telling her primary care doctor if it worsens. Pain Assessment Currently in Pain?: Yes Pain Score: 2  Pain Location: Leg Pain Orientation: Right   Exercise/Treatments Aerobic Stationary Bike: Nustep, seat 4 level  hills #2, resistance 2,  X 10 minutes average SPM w/o UE assist Machines for Strengthening Cybex Knee Extension: 1 PL 2x10 Cybex Knee Flexion: 2 PL 2x10 Standing Lateral Step Up: Limitations;Step Height: 6" Lateral Step Up Limitations: Hip Hikes Rt hip Rocker Board: 3 minutes SLS with Vectors: 5x 5" with HHA Other Standing Knee Exercises: side stepping, retro gait 2RT Other Standing Knee Exercises: tandem gait on balance beam  2 RT with min assit     Physical Therapy Assessment and Plan PT Assessment and Plan Clinical Impression Statement: Pt with improving gait and overall ability with therex needed decreased vc's.  Pt with 2 LOB on balance beam but able to self correct. PT Plan: Continued to focus on improving Rt hip strength to decrease Trendlenberg gait.  Improve activity tolerance and continue to increase resistance on aerobic machines.      Problem List Patient Active Problem List   Diagnosis Date Noted  . S/P knee replacement 04/27/2013  . Knee pain 04/27/2013  . Muscle weakness (generalized) 04/27/2013  . Hypertension 10/15/2011  . Status post THR (total hip replacement) 07/26/2011  . Hip pain 07/26/2011  . Difficulty in walking(719.7) 07/26/2011  . DYSLIPIDEMIA  05/11/2009  . BIPOLAR AFFECTIVE DISORDER 05/11/2009  . CAD, UNSPECIFIED SITE 05/11/2009  . GERD 05/11/2009  . FIBROMYALGIA 05/11/2009  . DIZZINESS 05/11/2009  . CHEST PAIN-UNSPECIFIED 05/11/2009    PT - End of Session Activity Tolerance: Patient tolerated treatment well General Behavior During Therapy: [facility] for tasks assessed/performed Cognition: WFL for tasks performed    Lurena Nida, PTA/CLT 06/08/2013, 10:16 AM

## 2013-06-10 ENCOUNTER — Ambulatory Visit (HOSPITAL_COMMUNITY): Payer: Medicare Other | Admitting: Physical Therapy

## 2013-06-11 ENCOUNTER — Ambulatory Visit (HOSPITAL_COMMUNITY)
Admission: RE | Admit: 2013-06-11 | Discharge: 2013-06-11 | Disposition: A | Discharge status: Home / Self Care | Payer: Medicare Other | Source: Ambulatory Visit | Attending: Pulmonary Disease | Admitting: Pulmonary Disease

## 2013-06-11 ENCOUNTER — Telehealth (HOSPITAL_COMMUNITY): Payer: Self-pay

## 2013-06-11 ENCOUNTER — Other Ambulatory Visit (HOSPITAL_COMMUNITY): Payer: Self-pay | Admitting: Pulmonary Disease

## 2013-06-11 DIAGNOSIS — M25539 Pain in unspecified wrist: Secondary | ICD-10-CM | POA: Insufficient documentation

## 2013-06-11 DIAGNOSIS — M25532 Pain in left wrist: Secondary | ICD-10-CM

## 2013-06-15 ENCOUNTER — Ambulatory Visit (HOSPITAL_COMMUNITY): Payer: Medicare Other | Admitting: Physical Therapy

## 2013-06-17 ENCOUNTER — Ambulatory Visit (HOSPITAL_COMMUNITY): Payer: Medicare Other | Admitting: Physical Therapy

## 2013-06-19 ENCOUNTER — Other Ambulatory Visit: Payer: Self-pay | Admitting: Certified Nurse Midwife

## 2013-06-22 NOTE — Telephone Encounter (Signed)
eScribe request for refill on VITAMIN D Last filled - 06/18/12 X 1 YEAR Last AEX - 04/01/12 Next AEX - not scheduled. Last Vitamin D - 11/14/09 = 41 Please advise refills.  Paper chart on your shelf.

## 2013-06-23 NOTE — Telephone Encounter (Signed)
We have not seen this patient in over a year and I do not see where we have checked the vit. D But she has had cardio labs per computer. Need to hold request until seen.

## 2013-06-23 NOTE — Telephone Encounter (Signed)
RX refused with pharmacy.  PC to pt--no answer.  I will continue to try to reach patient.

## 2013-06-24 NOTE — Telephone Encounter (Signed)
Pt notified and appt scheduled for 06/29/13.

## 2013-06-26 ENCOUNTER — Encounter: Payer: Self-pay | Admitting: Certified Nurse Midwife

## 2013-06-29 ENCOUNTER — Encounter: Payer: Self-pay | Admitting: Certified Nurse Midwife

## 2013-06-29 ENCOUNTER — Ambulatory Visit (INDEPENDENT_AMBULATORY_CARE_PROVIDER_SITE_OTHER): Payer: Medicare Other | Admitting: Certified Nurse Midwife

## 2013-06-29 VITALS — BP 100/60 | HR 72 | Resp 16 | Ht <= 58 in | Wt 124.0 lb

## 2013-06-29 DIAGNOSIS — Z79899 Other long term (current) drug therapy: Secondary | ICD-10-CM

## 2013-07-01 NOTE — Progress Notes (Signed)
72 y.o. Married Caucasian female G2P2002 here for discussion of Vitamin D Rx use and also to discuss continued Gyn care. Patient sees PCP for aex, labs and some medication management, she also Rheumatologist, and Cardiology, and is concerned with cost containment and also health care duplication. Patient has had 2 hip replacements and osteopenia. She currently takes Vitamin D 50,000 IU twice monthly and Estrace 1 mg daily, managed here.  Denies any other health issues today.    O: Healthy WD,WN female, ambulates with limp. Affect: normal orientation x 3  A:Vitamin D deficiency 2-Menopausal on ERT 3-Osteopenia managed by Rheumatologist  P: Discussed with patient that Vitamin improves absorption of calcium and current recommendation indicate with normal level, OTC 1000 IU supportive. Discussed estrogen use benefits and risks in bone support and daily use. Questions addressed. Also discussed PCP management if her PCP does breast exams and gyn exam.  Patient feels she needs to still continue care with GYN, because PCP not comfortable with this type of management, but she will discuss with then on next visit. Patient will schedule follow up exam here after discussion, if needed.  Questions addressed.   RV as indicated   20* minutes spent with patient with >50% of time spent in face to face counseling.

## 2013-07-02 NOTE — Progress Notes (Signed)
Note reviewed, agree with plan.  Mattisen Pohlmann, MD  

## 2013-07-27 ENCOUNTER — Ambulatory Visit (INDEPENDENT_AMBULATORY_CARE_PROVIDER_SITE_OTHER): Payer: Medicare Other | Admitting: Certified Nurse Midwife

## 2013-07-27 ENCOUNTER — Encounter: Payer: Self-pay | Admitting: Certified Nurse Midwife

## 2013-07-27 VITALS — BP 102/58 | HR 68 | Resp 16 | Ht <= 58 in | Wt 125.0 lb

## 2013-07-27 DIAGNOSIS — N951 Menopausal and female climacteric states: Secondary | ICD-10-CM

## 2013-07-27 DIAGNOSIS — Z01419 Encounter for gynecological examination (general) (routine) without abnormal findings: Secondary | ICD-10-CM

## 2013-07-27 DIAGNOSIS — Z0000 Encounter for general adult medical examination without abnormal findings: Secondary | ICD-10-CM

## 2013-07-27 DIAGNOSIS — E559 Vitamin D deficiency, unspecified: Secondary | ICD-10-CM

## 2013-07-27 LAB — COMPREHENSIVE METABOLIC PANEL
ALT: 20 U/L (ref 0–35)
AST: 23 U/L (ref 0–37)
Albumin: 4.3 g/dL (ref 3.5–5.2)
Alkaline Phosphatase: 72 U/L (ref 39–117)
BUN: 15 mg/dL (ref 6–23)
CO2: 31 mEq/L (ref 19–32)
Calcium: 9.7 mg/dL (ref 8.4–10.5)
Chloride: 101 mEq/L (ref 96–112)
Creat: 0.7 mg/dL (ref 0.50–1.10)
Glucose, Bld: 91 mg/dL (ref 70–99)
Potassium: 4.5 mEq/L (ref 3.5–5.3)
Sodium: 137 mEq/L (ref 135–145)
Total Bilirubin: 0.2 mg/dL — ABNORMAL LOW (ref 0.3–1.2)
Total Protein: 6.4 g/dL (ref 6.0–8.3)

## 2013-07-27 LAB — LIPID PANEL
Cholesterol: 150 mg/dL (ref 0–200)
HDL: 53 mg/dL (ref >39)
LDL Cholesterol: 69 mg/dL (ref 0–99)
Total CHOL/HDL Ratio: 2.8 Ratio
Triglycerides: 140 mg/dL (ref <150)
VLDL: 28 mg/dL (ref 0–40)

## 2013-07-27 MED ORDER — ESTRADIOL 1 MG PO TABS: 0.5000 mg | ORAL_TABLET | Freq: Every day | ORAL | Status: DC

## 2013-07-27 NOTE — Progress Notes (Signed)
72 y.o. G3P2002 Widowed Caucasian Fe here for annual exam.  Menopausal on ERT.  Denies vaginal bleeding or vaginal dryness. Had knee replacement this year on right knee, doing well. Today would be her spouse 15 birthday( passed last year.)  Sees PCP for aex , hypertension and cholesterol management. Patient taking only 1/2 tablet of her 1mg  Estrace. No health issues today. Patient desires screening labs be done here today, she has had none with PCP.   Patient's last menstrual period was 02/10/1993.          Sexually active: no  The current method of family planning is status post hysterectomy.    Exercising: yes  walking occ Smoker:  no  Health Maintenance: Pap:  07-06-02 neg MMG:  07/20/13 normal Colonoscopy:  2008 BMD:   2009 TDaP:  2014 Labs: none Self breast exam: done occ   reports that she has never smoked. She has never used smokeless tobacco. She reports that she drinks about 1.5 ounces of alcohol per week. She reports that she does not use illicit drugs.  Past Medical History  Diagnosis Date  . CAD (coronary artery disease)   . Chest pain   . Dizziness   . Dyslipidemia   . GERD (gastroesophageal reflux disease)   . Bipolar affective disorder   . Fibromyalgia   . H/O: hysterectomy   . Hyperlipidemia   . Arthritis   . Chronic fatigue   . Hypertension     Past Surgical History  Procedure Laterality Date  . Colonoscopy  01/29/2007  . [place] bunionectomy      left foot  . Esophagogastroduodenoscopy      followed by colonoscopy with snare polypectomy  . Cystectomy    . Breast lumpectomy    . Spinal fusion    . Joint replacement    . Bil hip replacement    . Breast biopsy Left 1989  . Shoulder arthroscopy Left 2005  . Bunionectomy Left 2005  . Spinal fusion  2004    L4-L5  . Total hip arthroplasty Right 06/2006  . Total shoulder replacement Left 08/2005  . Total hip arthroplasty  8/12  . Abdominal hysterectomy  1994    TAH- DUB, BSO    Current Outpatient  Prescriptions  Medication Sig Dispense Refill  . aspirin 81 MG tablet Take 81 mg by mouth daily.      . BuPROPion HBr (APLENZIN) 522 MG TB24 Take by mouth every morning.       . cloNIDine (CATAPRES - DOSED IN MG/24 HR) 0.1 mg/24hr patch once a week.      . cyclobenzaprine (FLEXERIL) 10 MG tablet Take 10 mg by mouth as needed.       . DULoxetine (CYMBALTA) 60 MG capsule Take 120 mg by mouth daily.       . ergocalciferol (VITAMIN D2) 50000 UNITS capsule Take 50,000 Units by mouth every 14 (fourteen) days.       Marland Kitchen estradiol (ESTRACE) 1 MG tablet Take 1 mg by mouth daily.        . fentaNYL (DURAGESIC - DOSED MCG/HR) 50 MCG/HR Place 1 patch onto the skin every 3 (three) days. For PAIN      . lamoTRIgine (LAMICTAL) 150 MG tablet Take 150 mg by mouth at bedtime.      . lidocaine (LIDODERM) 5 % Place 1-3 patches onto the skin as needed. Remove & Discard patch within 12 hours or as directed by MD      . losartan (COZAAR) 50  MG tablet Take 50 mg by mouth daily.      . meloxicam (MOBIC) 7.5 MG tablet Take 7.5 mg by mouth 2 (two) times daily.       . Multiple Vitamin (MULTIVITAMIN) tablet Take 1 tablet by mouth daily.        . niacin (SLO-NIACIN) 500 MG tablet Take 500 mg by mouth 2 (two) times daily with a meal.        . NITROSTAT 0.4 MG SL tablet PLACE ONE TABLET UNDER TONGUE EVERY 5 MIN UP TO 3 DOSES AS NEEDED FORCHEST PAIN.  25 each  3  . Omega-3 Fatty Acids (FISH OIL) 1000 MG CAPS Take by mouth.        Marland Kitchen omeprazole (PRILOSEC) 40 MG capsule Take 40 mg by mouth daily.      Marland Kitchen oxyCODONE-acetaminophen (PERCOCET/ROXICET) 5-325 MG per tablet as needed.      . RESTASIS 0.05 % ophthalmic emulsion 2 (two) times daily.      . risperiDONE (RISPERDAL) 1 MG tablet Take 1 mg by mouth at bedtime.       . simvastatin (ZOCOR) 40 MG tablet Take 40 mg by mouth at bedtime.      . vitamin C (ASCORBIC ACID) 500 MG tablet Take 500 mg by mouth daily.        Marland Kitchen zolpidem (AMBIEN) 10 MG tablet daily.       No current  facility-administered medications for this visit.    Family History  Problem Relation Age of Onset  . Heart disease Mother   . Heart attack Mother   . Heart disease Father   . Heart attack Father   . Stroke Sister   . Stroke Maternal Grandfather   . Cancer Paternal Grandmother     ROS:  Pertinent items are noted in HPI.  Otherwise, a comprehensive ROS was negative.  Exam:   BP 102/58  Pulse 68  Resp 16  Ht 4' 9.25" (1.454 m)  Wt 125 lb (56.7 kg)  BMI 26.82 kg/m2  LMP 02/10/1993 Height: 4' 9.25" (145.4 cm)  Ht Readings from Last 3 Encounters:  07/27/13 4' 9.25" (1.454 m)  06/29/13 4' 8.75" (1.441 m)  11/28/12 4\' 10"  (1.473 m)    General appearance: alert, cooperative and appears stated age Head: Normocephalic, without obvious abnormality, atraumatic Neck: no adenopathy, supple, symmetrical, trachea midline and thyroid normal to inspection and palpation Lungs: clear to auscultation bilaterally Breasts: normal appearance, no masses or tenderness, No nipple retraction or dimpling, No nipple discharge or bleeding, No axillary or supraclavicular adenopathy Heart: regular rate and rhythm Abdomen: soft, non-tender; no masses,  no organomegaly Extremities: extremities normal, atraumatic, no cyanosis or edema Skin: Skin color, texture, turgor normal. No rashes or lesions Lymph nodes: Cervical, supraclavicular, and axillary nodes normal. No abnormal inguinal nodes palpated Neurologic: Grossly normal   Pelvic: External genitalia:  no lesions              Urethra:  normal appearing urethra with no masses, tenderness or lesions              Bartholin's and Skene's: normal                 Vagina: normal appearing vagina with normal color and discharge, no lesions              Cervix: absent              Pap taken: no Bimanual Exam:  Uterus:  uterus absent  Adnexa: no mass, fullness, tenderness and adnexa absent bilateral               Rectovaginal: Confirms                Anus:  normal sphincter tone, no lesions  A:  Well Woman with normal exam  Menopausal on ERT desires continuance  Hypertension on stable medication with PCP  Right Knee replacement this year  P:   Reviewed health and wellness pertinent to exam  Discussed [facility] recommendation of HRT after 60 and possible increase of CVD changes and dementia risk. Patient has tried to stop ERT, but has hot flashes again.  Discussed decreasing the dosage and weaning down again. Patient would like to try. Rx Estrace .05mg   Continue follow up with PCP as indicated  Labs: CMP,Lipid panel, Vitamin D, TSH  Pap smear as per guidelines   Mammogram yearly pap smear not taken today  counseled on breast self exam, mammography screening, use and side effects of HRT, adequate intake of calcium and vitamin D, diet and exercise, Kegel's exercises  return annually or prn  An After Visit Summary was printed and given to the patient.

## 2013-07-27 NOTE — Patient Instructions (Signed)

## 2013-07-28 ENCOUNTER — Other Ambulatory Visit: Payer: Self-pay | Admitting: Certified Nurse Midwife

## 2013-07-28 DIAGNOSIS — E039 Hypothyroidism, unspecified: Secondary | ICD-10-CM

## 2013-07-28 LAB — VITAMIN D 25 HYDROXY (VIT D DEFICIENCY, FRACTURES): Vit D, 25-Hydroxy: 44 ng/mL (ref 30–89)

## 2013-07-28 LAB — TSH: TSH: 6.666 u[IU]/mL — ABNORMAL HIGH (ref 0.350–4.500)

## 2013-07-28 MED ORDER — LEVOTHYROXINE SODIUM 25 MCG PO TABS: 25.0000 ug | ORAL_TABLET | Freq: Every day | ORAL | Status: DC

## 2013-07-28 NOTE — Progress Notes (Signed)
Note reviewed, agree with plan.  Taheera Thomann, MD  

## 2013-07-29 ENCOUNTER — Telehealth: Payer: Self-pay

## 2013-07-29 NOTE — Telephone Encounter (Signed)
[email or web address]

## 2013-07-29 NOTE — Telephone Encounter (Signed)
Message copied by Eliezer Bottom on Wed Jul 29, 2013 10:22 AM ------      Message from: Verner Chol      Created: Tue Jul 28, 2013 10:35 PM       Notify patient that liver, kidney, glucose profile normal      Cholesterol panel normal      Vit D good continue OTC Vit. D daily      Thyroid level indicates Hypothyroid needs Rx Synthroid 25 mcg daily and recheck in one month, this usually helps with fatigue(order in) ------

## 2013-07-29 NOTE — Telephone Encounter (Signed)
Patient notified

## 2013-07-29 NOTE — Telephone Encounter (Signed)
Patient returned your phone call.

## 2013-08-05 ENCOUNTER — Encounter: Payer: Self-pay | Admitting: Physician Assistant

## 2013-08-05 ENCOUNTER — Ambulatory Visit (INDEPENDENT_AMBULATORY_CARE_PROVIDER_SITE_OTHER): Payer: Medicare Other | Admitting: Physician Assistant

## 2013-08-05 VITALS — BP 140/82 | HR 92 | Wt 124.0 lb

## 2013-08-05 DIAGNOSIS — I2510 Atherosclerotic heart disease of native coronary artery without angina pectoris: Secondary | ICD-10-CM

## 2013-08-05 DIAGNOSIS — R079 Chest pain, unspecified: Secondary | ICD-10-CM

## 2013-08-05 DIAGNOSIS — E785 Hyperlipidemia, unspecified: Secondary | ICD-10-CM

## 2013-08-05 DIAGNOSIS — I10 Essential (primary) hypertension: Secondary | ICD-10-CM

## 2013-08-05 MED ORDER — SIMVASTATIN 40 MG PO TABS: 20.0000 mg | ORAL_TABLET | Freq: Every day | ORAL | Status: DC

## 2013-08-05 NOTE — Progress Notes (Signed)
HPI:  This is a 72 year old female patient of Dr. Huston Foley who has a history of coronary artery disease with cath in 2010 showing normal LV function, 20-30% proximal RCA, 50-60% ostial LAD. Overall this was a better than her cath 6 years prior. She has not been seen for over a year and is doing well. She did have a knee replacement back in April. She denies any chest pain, palpitations, dyspnea, or presyncope. She occasionally gets dizzy if she stands up too quickly but otherwise has no regular dizziness. Her blood pressures been fine and she has been rehabilitating her knee. She would like to cut back on her Zocor so she is not taking as much medication. Lipids done recently showed excellent control. She does say that her sister stopped taking all her medications and had CABG x4 recently. Allergies  -- Hydrocodone-Acetaminophen    --  REACTION: urticaria (hives)  -- Vicodin [Hydrocodone-Acetaminophen] -- Itching  Current Outpatient Prescriptions on File Prior to Visit: aspirin 81 MG tablet, Take 81 mg by mouth daily., Disp: , Rfl:  cloNIDine (CATAPRES - DOSED IN MG/24 HR) 0.1 mg/24hr patch, once a week., Disp: , Rfl:  cyclobenzaprine (FLEXERIL) 10 MG tablet, Take 10 mg by mouth as needed. , Disp: , Rfl:  DULoxetine (CYMBALTA) 60 MG capsule, Take 120 mg by mouth daily. , Disp: , Rfl:  ergocalciferol (VITAMIN D2) 50000 UNITS capsule, Take 50,000 Units by mouth every 14 (fourteen) days. , Disp: , Rfl:  estradiol (ESTRACE) 1 MG tablet, Take 0.5 tablets (0.5 mg total) by mouth daily. Take 1/2 tablet po daily, Disp: 30 tablet, Rfl: 6 fentaNYL (DURAGESIC - DOSED MCG/HR) 50 MCG/HR, Place 1 patch onto the skin every 3 (three) days. For PAIN, Disp: , Rfl:  lamoTRIgine (LAMICTAL) 150 MG tablet, Take 150 mg by mouth at bedtime., Disp: , Rfl:  levothyroxine (LEVOTHROID) 25 MCG tablet, Take 1 tablet (25 mcg total) by mouth daily., Disp: 30 tablet, Rfl: 1 lidocaine (LIDODERM) 5 %, Place 1-3 patches onto the skin  as needed. Remove & Discard patch within 12 hours or as directed by MD, Disp: , Rfl:  losartan (COZAAR) 50 MG tablet, Take 50 mg by mouth daily., Disp: , Rfl:  meloxicam (MOBIC) 7.5 MG tablet, Take 7.5 mg by mouth 2 (two) times daily. , Disp: , Rfl:  Multiple Vitamin (MULTIVITAMIN) tablet, Take 1 tablet by mouth daily.  , Disp: , Rfl:  niacin (SLO-NIACIN) 500 MG tablet, Take 500 mg by mouth 2 (two) times daily with a meal.  , Disp: , Rfl:  NITROSTAT 0.4 MG SL tablet, PLACE ONE TABLET UNDER TONGUE EVERY 5 MIN UP TO 3 DOSES AS NEEDED FORCHEST PAIN., Disp: 25 each, Rfl: 3 Omega-3 Fatty Acids (FISH OIL) 1000 MG CAPS, Take by mouth.  , Disp: , Rfl:   omeprazole (PRILOSEC) 40 MG capsule, Take 40 mg by mouth daily., Disp: , Rfl:  oxyCODONE-acetaminophen (PERCOCET/ROXICET) 5-325 MG per tablet, as needed., Disp: , Rfl:  RESTASIS 0.05 % ophthalmic emulsion, 2 (two) times daily., Disp: , Rfl:  risperiDONE (RISPERDAL) 1 MG tablet, Take 1 mg by mouth at bedtime. , Disp: , Rfl:  simvastatin (ZOCOR) 40 MG tablet, Take 40 mg by mouth at bedtime., Disp: , Rfl:  vitamin C (ASCORBIC ACID) 500 MG tablet, Take 500 mg by mouth daily.  , Disp: , Rfl:  zolpidem (AMBIEN) 10 MG tablet, daily., Disp: , Rfl:   No current facility-administered medications on file prior to visit.   Past Medical History:  CAD (coronary artery disease)                                Chest pain                                                   Dizziness                                                    Dyslipidemia                                                 GERD (gastroesophageal reflux disease)                       Bipolar affective disorder                                   Fibromyalgia                                                 H/O: hysterectomy                                            Hyperlipidemia                                               Arthritis                                                    Chronic  fatigue                                              Hypertension                                                Past Surgical History:   COLONOSCOPY                                      01/29/2007   [place] bunion[place]  Comment:left foot   ESOPHAGOGASTRODUODENOSCOPY                                      Comment:followed by colonoscopy with snare polypectomy   CYSTECTOMY                                                    BREAST LUMPECTOMY                                             SPINAL FUSION                                                 JOINT REPLACEMENT                                             bil hip replacement                                           BREAST BIOPSY                                   Left 1989         SHOULDER ARTHROSCOPY                            Left 2005         BUNIONECTOMY                                    Left 2005         SPINAL FUSION                                    2004           Comment:L4-L5   TOTAL HIP ARTHROPLASTY                          Right 06/2006       TOTAL SHOULDER REPLACEMENT                      Left 08/2005      TOTAL HIP ARTHROPLASTY                           8/12         ABDOMINAL HYSTERECTOMY  1994           Comment:TAH- DUB, BSO  Review of patient's family history indicates:   Heart disease                  Mother                   Heart attack                   Mother                   Heart disease                  Father                   Heart attack                   Father                   Stroke                         Sister                   Stroke                         Maternal Grandfather     Cancer                         Paternal Grandmother     Social History   Marital Status: Widowed             Spouse Name:                      Years of Education:                 Number of children:             Occupational History Occupation           Associate Professor            Comment              unemployed                                Social History Main Topics   Smoking Status: Never Smoker                     Smokeless Status: Never Used                       Alcohol Use: Yes           1.5 - 2 oz/week      3-4 Drinks containing 0.5 oz of alcohol per week   Drug Use: No             Sexual Activity: No                     Comment: TAH  Other Topics            Concern   None on file  Social History Narrative   None on file    ROS: See history of present illness otherwise negative  PHYSICAL EXAM: Well-nournished, in no acute distress. Neck: No JVD, HJR, Bruit, or thyroid enlargement  Lungs: No tachypnea, clear without wheezing, rales, or rhonchi  Cardiovascular: RRR, PMI not displaced, heart sounds normal, no murmurs, gallops, bruit, thrill, or heave.  Abdomen: BS normal. Soft without organomegaly, masses, lesions or tenderness.  Extremities: without cyanosis, clubbing or edema. Good distal pulses bilateral  SKin: Warm, no lesions or rashes   Musculoskeletal: No deformities  Neuro: no focal signs  BP 140/82  Pulse 92  Wt 124 lb (56.246 kg)  BMI 26.6 kg/m2  LMP 02/10/1993   EKG: Normal sinus rhythm normal EKG

## 2013-08-05 NOTE — Assessment & Plan Note (Signed)
Stable without chest pain 

## 2013-08-05 NOTE — Assessment & Plan Note (Signed)
Blood pressure controlled. 

## 2013-08-05 NOTE — Assessment & Plan Note (Addendum)
Recent lipid profile is excellent. She like to cut back on her Zocor. I think this is reasonable as she is also taking niacin. Will decrease her Zocor to 40 mg one half tab daily. Fasting lipid panel in 6 months.

## 2013-08-05 NOTE — Patient Instructions (Signed)
DECREASE ZOCOR TO 20MG  DAILY  Your physician recommends that you return for a FASTING lipid profile, and LFT IN 6 MONTHS  Your physician recommends that you schedule a follow-up appointment in: 1 year with Dr.Ross

## 2013-08-07 ENCOUNTER — Ambulatory Visit: Payer: Medicare Other | Admitting: Internal Medicine

## 2013-08-20 ENCOUNTER — Telehealth: Payer: Self-pay | Admitting: Certified Nurse Midwife

## 2013-08-20 DIAGNOSIS — R5383 Other fatigue: Secondary | ICD-10-CM

## 2013-08-20 DIAGNOSIS — E039 Hypothyroidism, unspecified: Secondary | ICD-10-CM

## 2013-08-20 NOTE — Telephone Encounter (Signed)
Patient is asking if she can have labs done at another office.

## 2013-08-20 NOTE — Telephone Encounter (Signed)
[facility] [employer] on   [place], [place] [facility] [address], [address] [place], [place] [address] [phone] (Phone)   9:00am-12:00pm 7:00am-4:00pm  TSH ordered for lab collect.

## 2013-08-28 ENCOUNTER — Other Ambulatory Visit: Payer: Medicare Other

## 2013-09-02 ENCOUNTER — Other Ambulatory Visit: Payer: Self-pay | Admitting: Certified Nurse Midwife

## 2013-09-02 DIAGNOSIS — E039 Hypothyroidism, unspecified: Secondary | ICD-10-CM

## 2013-09-02 LAB — TSH: TSH: 1.928 u[IU]/mL (ref 0.350–4.500)

## 2013-09-02 MED ORDER — LEVOTHYROXINE SODIUM 25 MCG PO TABS: 25.0000 ug | ORAL_TABLET | Freq: Every day | ORAL | Status: DC

## 2013-09-02 NOTE — Telephone Encounter (Signed)
done

## 2013-09-22 ENCOUNTER — Ambulatory Visit (INDEPENDENT_AMBULATORY_CARE_PROVIDER_SITE_OTHER): Payer: 59 | Admitting: Gastroenterology

## 2013-09-22 ENCOUNTER — Encounter: Payer: Self-pay | Admitting: Gastroenterology

## 2013-09-22 ENCOUNTER — Encounter (INDEPENDENT_AMBULATORY_CARE_PROVIDER_SITE_OTHER): Payer: Self-pay

## 2013-09-22 VITALS — BP 112/76 | HR 78 | Temp 98.5°F | Ht <= 58 in | Wt 125.6 lb

## 2013-09-22 DIAGNOSIS — Z86010 Personal history of colonic polyps: Secondary | ICD-10-CM

## 2013-09-22 DIAGNOSIS — K219 Gastro-esophageal reflux disease without esophagitis: Secondary | ICD-10-CM

## 2013-09-22 DIAGNOSIS — K5900 Constipation, unspecified: Secondary | ICD-10-CM

## 2013-09-22 MED ORDER — POLYETHYLENE GLYCOL 3350 17 GM/SCOOP PO POWD: ORAL | Status: DC

## 2013-09-22 MED ORDER — DEXLANSOPRAZOLE 60 MG PO CPDR: 60.0000 mg | DELAYED_RELEASE_CAPSULE | Freq: Every day | ORAL | Status: DC

## 2013-09-22 NOTE — Assessment & Plan Note (Addendum)
Constipation, likely due to medication effect. MiraLax 17 g twice a day for 3 days then daily until regular pattern. Then take once daily as needed. Increase dietary fiber and fluid intake. If no improvement, she can call and we would consider starting Linzess. or Amitiza.

## 2013-09-22 NOTE — Assessment & Plan Note (Signed)
Refractory heartburn. No improvement when switched from omeprazole to pantoprazole. She has been on this for several months. No complaints of dysphagia. Trial of Dexilant 60 mg daily. Prescription and samples provided. If she does not improve on this therapy, would consider upper endoscopy as next step. Given her polypharmacy she may require deep sedation although she tells me she is hesitant to do this at this point. Office visit in January or sooner if needed. Follow antireflux measures as discussed.

## 2013-09-22 NOTE — Progress Notes (Signed)
Primary Care Physician:  Fredirick Maudlin, MD  Primary Gastroenterologist:  Roetta Sessions, MD   Chief Complaint  Patient presents with  . Bloated  . Gas  . Constipation    HPI:  [name] [name] is a 72 y.o. female here for followup. She received a letter last year because she was due for colonoscopy with history of adenomatous colon polyps. She's had multiple issues including loss of her spouse and hip replacement/knee replacement. She presents today with complaints of bloating/gas/constipation. Tries to eat more fiber, daily stool softner, MOM prn. BM every 3-4 days. No melena, brbpr. Dr. Juanetta Gosling switched from omeprazole to pantoprazole (on for 5-6 months). Lots of burning, belching. No dysphagia. Appetite ok. No n/v.  Denies weight loss. At this time she is not ready to pursue colonoscopy.    Current Outpatient Prescriptions  Medication Sig Dispense Refill  . aspirin 81 MG tablet Take 81 mg by mouth daily.      . BuPROPion HBr 522 MG TB24 Take 1 tablet by mouth daily.      . cloNIDine (CATAPRES - DOSED IN MG/24 HR) 0.1 mg/24hr patch once a week.      . cyclobenzaprine (FLEXERIL) 10 MG tablet Take 10 mg by mouth as needed.       . DULoxetine (CYMBALTA) 60 MG capsule Take 120 mg by mouth daily.       . ergocalciferol (VITAMIN D2) 50000 UNITS capsule Take 50,000 Units by mouth every 14 (fourteen) days.       Marland Kitchen estradiol (ESTRACE) 1 MG tablet Take 0.5 tablets (0.5 mg total) by mouth daily. Take 1/2 tablet po daily  30 tablet  6  . fentaNYL (DURAGESIC - DOSED MCG/HR) 50 MCG/HR Place 1 patch onto the skin every 3 (three) days. For PAIN      . lamoTRIgine (LAMICTAL) 150 MG tablet Take 150 mg by mouth at bedtime.      Marland Kitchen levothyroxine (LEVOTHROID) 25 MCG tablet Take 1 tablet (25 mcg total) by mouth daily.  30 tablet  4  . lidocaine (LIDODERM) 5 % Place 1-3 patches onto the skin as needed. Remove & Discard patch within 12 hours or as directed by MD      . losartan (COZAAR) 50 MG tablet Take 50 mg  by mouth daily.      . meloxicam (MOBIC) 7.5 MG tablet Take 7.5 mg by mouth 2 (two) times daily.       . Multiple Vitamin (MULTIVITAMIN) tablet Take 1 tablet by mouth daily.        . niacin (SLO-NIACIN) 500 MG tablet Take 500 mg by mouth 2 (two) times daily with a meal.        . NITROSTAT 0.4 MG SL tablet PLACE ONE TABLET UNDER TONGUE EVERY 5 MIN UP TO 3 DOSES AS NEEDED FORCHEST PAIN.  25 each  3  . Omega-3 Fatty Acids (FISH OIL) 1000 MG CAPS Take by mouth.        Marland Kitchen omeprazole (PRILOSEC) 40 MG capsule Take 40 mg by mouth daily.      Marland Kitchen oxyCODONE-acetaminophen (PERCOCET/ROXICET) 5-325 MG per tablet as needed.      . RESTASIS 0.05 % ophthalmic emulsion 2 (two) times daily.      . risperiDONE (RISPERDAL) 1 MG tablet Take 1 mg by mouth at bedtime.       . simvastatin (ZOCOR) 40 MG tablet Take 0.5 tablets (20 mg total) by mouth at bedtime.  90 tablet  1  . vitamin C (ASCORBIC  ACID) 500 MG tablet Take 500 mg by mouth daily.        Marland Kitchen zolpidem (AMBIEN) 10 MG tablet daily.       No current facility-administered medications for this visit.    Allergies as of 09/22/2013 - Review Complete 09/22/2013  Allergen Reaction Noted  . Hydrocodone-acetaminophen  07/05/2008  . Vicodin [hydrocodone-acetaminophen] Itching 06/26/2013    Past Medical History  Diagnosis Date  . CAD (coronary artery disease)   . Chest pain   . Dizziness   . Dyslipidemia   . GERD (gastroesophageal reflux disease)   . Bipolar affective disorder   . Fibromyalgia   . H/O: hysterectomy   . Hyperlipidemia   . Arthritis   . Chronic fatigue   . Hypertension   . Hx of adenomatous colonic polyps 2005    Past Surgical History  Procedure Laterality Date  . 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  Remainder of colonic mucosa was normal  . [place] bunionectomy      bilateral  . Esophagogastroduodenoscopy      followed by colonoscopy with snare  polypectomy  . Cystectomy      pilonidial cyst  . Breast lumpectomy    . Spinal fusion    . Right knee replacement  02/2013  . Breast biopsy Left 1989  . Shoulder arthroscopy Left 2005  . Bunionectomy Left 2005  . Spinal fusion  2004    L4-L5  . Total hip arthroplasty Right 06/2006  . Total shoulder replacement Left 08/2005  . Total hip arthroplasty  8/12  . Abdominal hysterectomy  1994    TAH- DUB, BSO  . Shoulder arthroscopy Right     Family History  Problem Relation Age of Onset  . Heart disease Mother   . Heart attack Mother   . Heart disease Father   . Heart attack Father   . Stroke Sister   . Stroke Maternal Grandfather   . Cancer Paternal Grandmother     History   Social History  . Marital Status: Widowed    Spouse Name: N/A    Number of Children: N/A  . Years of Education: N/A   Occupational History  . unemployed     retired Engineer, site   Social History Main Topics  . Smoking status: Never Smoker   . Smokeless tobacco: Never Used  . Alcohol Use: 1.5 - 2 oz/week    3-4 drink(s) per week  . Drug Use: No  . Sexual Activity: No     Comment: TAH   Other Topics Concern  . Not on file   Social History Narrative  . No narrative on file      ROS:  General: Negative for anorexia, weight loss, fever, chills, fatigue, weakness. Eyes: Negative for vision changes.  ENT: Negative for hoarseness, difficulty swallowing , nasal congestion. CV: Negative for chest pain, angina, palpitations, dyspnea on exertion, peripheral edema.  Respiratory: Negative for dyspnea at rest, dyspnea on exertion, cough, sputum, wheezing.  GI: See history of present illness. GU:  Negative for dysuria, hematuria, urinary incontinence, urinary frequency, nocturnal urination.  MS: Chronic pain Derm: Negative for rash or itching.  Neuro: Negative for weakness, abnormal sensation, seizure, frequent headaches, memory loss, confusion.  Psych: Positive for anxiety, depression. Negative  for suicidal ideation, hallucinations.  Endo: Negative for unusual weight change.  Heme: Negative for bruising or bleeding. Allergy: Negative for rash or hives.    Physical Examination:  BP 112/76  Pulse 78  Temp(Src) 98.5 F (36.9 C) (Oral)  Ht 4\' 10"  (1.473 m)  Wt 125 lb 9.6 oz (56.972 kg)  BMI 26.26 kg/m2  LMP 02/10/1993   General: Well-nourished, well-developed in no acute distress.  Head: Normocephalic, atraumatic.   Eyes: Conjunctiva pink, no icterus. Mouth: Oropharyngeal mucosa moist and pink , no lesions erythema or exudate. Neck: Supple without thyromegaly, masses, or lymphadenopathy.  Lungs: Clear to auscultation bilaterally.  Heart: Regular rate and rhythm, no murmurs rubs or gallops.  Abdomen: Bowel sounds are normal, nontender, nondistended, no hepatosplenomegaly or masses, no abdominal bruits or    hernia , no rebound or guarding.   Rectal: Not performed Extremities: No lower extremity edema. No clubbing or deformities.  Neuro: Alert and oriented x 4 , grossly normal neurologically.  Skin: Warm and dry, no rash or jaundice.   Psych: Alert and cooperative, normal mood and affect.  Labs: Lab Results  Component Value Date   WBC 10.3 11/28/2012   HGB 12.7 11/28/2012   HCT 38.2 11/28/2012   MCV 94.3 11/28/2012   PLT 218 11/28/2012   Lab Results  Component Value Date   CREATININE 0.70 07/27/2013   BUN 15 07/27/2013   NA 137 07/27/2013   K 4.5 07/27/2013   CL 101 07/27/2013   CO2 31 07/27/2013   Lab Results  Component Value Date   TSH 1.928 09/01/2013   Lab Results  Component Value Date   ALT 20 07/27/2013   AST 23 07/27/2013   ALKPHOS 72 07/27/2013   BILITOT 0.2* 07/27/2013     Imaging Studies: No results found.

## 2013-09-22 NOTE — Assessment & Plan Note (Addendum)
Overdue for surveillance colonoscopy. Patient would like to postpone until after the first of the year. We will bring her back in January to arrange. Consider deep sedation due to polypharmacy if patient agrees. I. FOBT for now.

## 2013-09-22 NOTE — Patient Instructions (Signed)
1. Please collect stool specimen and return to our office.  2. Stop pantoprazole. Start Dexilant one capsule daily for acid reflux. Prescription sent to your pharmacy. 3. Start Miralax one capful every day if needed for constipation. You may take twice a day for three days to get started. YOUR INSURANCE MAY COVER SO I SENT PRESCRIPTION TO YOUR PHARMACY.

## 2013-09-23 ENCOUNTER — Ambulatory Visit (INDEPENDENT_AMBULATORY_CARE_PROVIDER_SITE_OTHER): Payer: 59 | Admitting: Gastroenterology

## 2013-09-23 DIAGNOSIS — K5900 Constipation, unspecified: Secondary | ICD-10-CM

## 2013-09-23 LAB — IFOBT (OCCULT BLOOD): IFOBT: NEGATIVE

## 2013-09-23 NOTE — Progress Notes (Signed)
cc'd to pcp 

## 2013-09-23 NOTE — Progress Notes (Signed)
Pt return IFOBT test and it was negative 

## 2013-09-23 NOTE — Progress Notes (Signed)
Quick Note:  Please let patient know ifobt negative. Plan as per OV. ______

## 2013-09-25 ENCOUNTER — Other Ambulatory Visit: Payer: Self-pay | Admitting: Certified Nurse Midwife

## 2013-09-25 NOTE — Telephone Encounter (Signed)
eScribe request for refill on VITAMIN D [address] Last AEX - 07/27/13 Next AEX - 08/02/13  Vitamin D level checked on 07/27/13.  Pt advised in Result Note to continue OTC vitamin d.  RX denied.

## 2014-01-11 LAB — HM COLONOSCOPY

## 2014-01-18 ENCOUNTER — Ambulatory Visit (INDEPENDENT_AMBULATORY_CARE_PROVIDER_SITE_OTHER): Payer: Medicare Other | Admitting: Gastroenterology

## 2014-01-18 ENCOUNTER — Encounter: Payer: Self-pay | Admitting: Gastroenterology

## 2014-01-18 ENCOUNTER — Encounter (INDEPENDENT_AMBULATORY_CARE_PROVIDER_SITE_OTHER): Payer: Self-pay

## 2014-01-18 VITALS — BP 113/70 | HR 77 | Temp 97.3°F | Ht <= 58 in | Wt 127.8 lb

## 2014-01-18 DIAGNOSIS — Z86010 Personal history of colonic polyps: Secondary | ICD-10-CM

## 2014-01-18 DIAGNOSIS — K5900 Constipation, unspecified: Secondary | ICD-10-CM

## 2014-01-18 DIAGNOSIS — K219 Gastro-esophageal reflux disease without esophagitis: Secondary | ICD-10-CM

## 2014-01-18 MED ORDER — PEG 3350-KCL-NA BICARB-NACL 420 G PO SOLR: 4000.0000 mL | ORAL | Status: DC

## 2014-01-18 NOTE — Progress Notes (Signed)
Primary Care Physician: Alonza Bogus, MD  Primary Gastroenterologist:  Garfield Cornea, MD   Chief Complaint  Patient presents with  . Follow-up    HPI: [name] [name] is a 73 y.o. female here for f/u. Last seen in 09/2013. h/o adenomatous colon polyps, constipation, bloating, heartburn. At last OV she was not prepared to have EGD/TCS. She tried Dexilant did not tolerate due to abdominal pain. Burning in upper chest/bloating most days. Coffee 2-3 per day. Symptoms afterwards. No dysphagia. No vomiting. Bloating in abdomen. BM varies. May have BM every 2-3 days. Increased dietary fiber and added probiotics. Stools are not hard. No melena, brbpr.   Current Outpatient Prescriptions  Medication Sig Dispense Refill  . aspirin 81 MG tablet Take 81 mg by mouth daily.      . BuPROPion HBr 522 MG TB24 Take 1 tablet by mouth daily.      . cyclobenzaprine (FLEXERIL) 10 MG tablet Take 10 mg by mouth as needed for muscle spasms.       Marland Kitchen docusate sodium (COLACE) 100 MG capsule Take 100 mg by mouth daily.      . DULoxetine (CYMBALTA) 60 MG capsule Take 120 mg by mouth daily.       Marland Kitchen estradiol (ESTRACE) 1 MG tablet Take 0.5 tablets (0.5 mg total) by mouth daily. Take 1/2 tablet po daily  30 tablet  6  . lamoTRIgine (LAMICTAL) 150 MG tablet Take 150 mg by mouth at bedtime.      Marland Kitchen levothyroxine (LEVOTHROID) 25 MCG tablet Take 1 tablet (25 mcg total) by mouth daily.  30 tablet  4  . lidocaine (LIDODERM) 5 % Place 1-3 patches onto the skin as needed. Remove & Discard patch within 12 hours or as directed by MD      . losartan (COZAAR) 50 MG tablet Take 50 mg by mouth daily.      . meloxicam (MOBIC) 7.5 MG tablet Take 7.5 mg by mouth 2 (two) times daily.       . Multiple Vitamin (MULTIVITAMIN) tablet Take 1 tablet by mouth daily.        . niacin (SLO-NIACIN) 500 MG tablet Take 500 mg by mouth daily.       Marland Kitchen NITROSTAT 0.4 MG SL tablet PLACE ONE TABLET UNDER TONGUE EVERY 5 MIN UP TO 3 DOSES AS NEEDED  FORCHEST PAIN.  25 each  3  . Omega-3 Fatty Acids (FISH OIL) 1000 MG CAPS Take 1 capsule by mouth daily.       . pantoprazole (PROTONIX) 40 MG tablet Take 40 mg by mouth daily.       . polyethylene glycol powder (GLYCOLAX/MIRALAX) powder One capful (17grams) once daily for constipation as needed.  527 g  11  . RESTASIS 0.05 % ophthalmic emulsion Place 1 drop into both eyes 2 (two) times daily.       . risperiDONE (RISPERDAL) 1 MG tablet Take 0.5 mg by mouth at bedtime.       . simvastatin (ZOCOR) 40 MG tablet Take 0.5 tablets (20 mg total) by mouth at bedtime.  90 tablet  1  . zolpidem (AMBIEN) 10 MG tablet Take 10 mg by mouth at bedtime as needed for sleep.       Marland Kitchen ALPRAZolam (XANAX) 0.5 MG tablet Take 1 tablet by mouth daily as needed. anxiety      . Cholecalciferol (VITAMIN D-3) 5000 UNITS TABS Take 1 capsule by mouth every other day.      Marland Kitchen  Probiotic Product (PROBIOTIC DAILY PO) Take 1 tablet by mouth daily. Florify       No current facility-administered medications for this visit.    Allergies as of 01/18/2014 - Review Complete 01/18/2014  Allergen Reaction Noted  . Hydrocodone-acetaminophen  07/05/2008  . Vicodin [hydrocodone-acetaminophen] Itching 06/26/2013   Past Medical History  Diagnosis Date  . CAD (coronary artery disease)   . Chest pain   . Dizziness   . Dyslipidemia   . GERD (gastroesophageal reflux disease)   . Bipolar affective disorder   . Fibromyalgia   . H/O: hysterectomy   . Hyperlipidemia   . Arthritis   . Chronic fatigue   . Hypertension   . Hx of adenomatous colonic polyps 2005   Past Surgical History  Procedure Laterality Date  . 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  Remainder of colonic mucosa was normal  . [place] bunionectomy      bilateral  . Esophagogastroduodenoscopy      followed by colonoscopy with snare polypectomy  . Cystectomy      pilonidial cyst    . Breast lumpectomy    . Spinal fusion    . Right knee replacement  02/2013  . Breast biopsy Left 1989  . Shoulder arthroscopy Left 2005  . Bunionectomy Left 2005  . Spinal fusion  2004    L4-L5  . Total hip arthroplasty Right 06/2006  . Total shoulder replacement Left 08/2005  . Total hip arthroplasty  8/12  . Abdominal hysterectomy  1994    TAH- DUB, BSO  . Shoulder arthroscopy Right     ROS:  General: Negative for anorexia, weight loss, fever, chills, fatigue, weakness. ENT: Negative for hoarseness, difficulty swallowing , nasal congestion. CV: Negative for chest pain, angina, palpitations, dyspnea on exertion, peripheral edema.  Respiratory: Negative for dyspnea at rest, dyspnea on exertion, cough, sputum, wheezing.  GI: See history of present illness. GU:  Negative for dysuria, hematuria, urinary incontinence, urinary frequency, nocturnal urination.  Endo: Negative for unusual weight change.    Physical Examination:   BP 113/70  Pulse 77  Temp(Src) 97.3 F (36.3 C) (Oral)  Ht 4\' 10"  (1.473 m)  Wt 127 lb 12.8 oz (57.97 kg)  BMI 26.72 kg/m2  LMP 02/10/1993  General: Well-nourished, well-developed in no acute distress.  Eyes: No icterus. Mouth: Oropharyngeal mucosa moist and pink , no lesions erythema or exudate. Lungs: Clear to auscultation bilaterally.  Heart: Regular rate and rhythm, no murmurs rubs or gallops.  Abdomen: Bowel sounds are normal, nontender, nondistended, no hepatosplenomegaly or masses, no abdominal bruits or hernia , no rebound or guarding.   Extremities: No lower extremity edema. No clubbing or deformities. Neuro: Alert and oriented x 4   Skin: Warm and dry, no jaundice.   Psych: Alert and cooperative, normal mood and affect.    Imaging Studies: No results found.

## 2014-01-18 NOTE — Patient Instructions (Addendum)
1. Colonoscopy and upper endoscopy as scheduled. Please see separate instructions. 

## 2014-01-19 ENCOUNTER — Encounter (HOSPITAL_COMMUNITY): Payer: Self-pay

## 2014-01-19 NOTE — Assessment & Plan Note (Signed)
Due for surveillance colonoscopy. Augment conscious sedation with phenergan 25mg  IV 30 mins before due to polypharmacy.  I have discussed the risks, alternatives, benefits with regards to but not limited to the risk of reaction to medication, bleeding, infection, perforation and the patient is agreeable to proceed. Written consent to be obtained.

## 2014-01-19 NOTE — Assessment & Plan Note (Signed)
Continues to have refractory GERD. Did not tolerate Dexilant. Refractory to omeprazole and pantoprazole. EGD in near future. Discussed conscious versus deep sedation. She tolerated conscious sedation in past with polypharmacy. Plan to augment conscious sedation with phenergan 25mg  IV 30 minutes before.  I have discussed the risks, alternatives, benefits with regards to but not limited to the risk of reaction to medication, bleeding, infection, perforation and the patient is agreeable to proceed. Written consent to be obtained.

## 2014-01-19 NOTE — Assessment & Plan Note (Signed)
Miralax effective when she remembers to take it. TCS as planned.

## 2014-01-20 NOTE — Progress Notes (Signed)
cc'd to pcp 

## 2014-02-01 ENCOUNTER — Ambulatory Visit (HOSPITAL_COMMUNITY)
Admission: RE | Admit: 2014-02-01 | Discharge: 2014-02-01 | Disposition: A | Discharge status: Home / Self Care | Payer: Medicare Other | Source: Ambulatory Visit | Attending: Internal Medicine | Admitting: Internal Medicine

## 2014-02-01 ENCOUNTER — Encounter (HOSPITAL_COMMUNITY): Payer: Self-pay | Admitting: *Deleted

## 2014-02-01 ENCOUNTER — Encounter (HOSPITAL_COMMUNITY)
Admission: RE | Disposition: A | Discharge status: Home / Self Care | Payer: Self-pay | Source: Ambulatory Visit | Attending: Internal Medicine

## 2014-02-01 DIAGNOSIS — K2100 Gastro-esophageal reflux disease with esophagitis, without bleeding: Secondary | ICD-10-CM

## 2014-02-01 DIAGNOSIS — Z860100 Personal history of colon polyps, unspecified: Secondary | ICD-10-CM | POA: Insufficient documentation

## 2014-02-01 DIAGNOSIS — Z79899 Other long term (current) drug therapy: Secondary | ICD-10-CM | POA: Insufficient documentation

## 2014-02-01 DIAGNOSIS — K5900 Constipation, unspecified: Secondary | ICD-10-CM

## 2014-02-01 DIAGNOSIS — D131 Benign neoplasm of stomach: Secondary | ICD-10-CM | POA: Insufficient documentation

## 2014-02-01 DIAGNOSIS — Z09 Encounter for follow-up examination after completed treatment for conditions other than malignant neoplasm: Secondary | ICD-10-CM | POA: Insufficient documentation

## 2014-02-01 DIAGNOSIS — I10 Essential (primary) hypertension: Secondary | ICD-10-CM | POA: Insufficient documentation

## 2014-02-01 DIAGNOSIS — F319 Bipolar disorder, unspecified: Secondary | ICD-10-CM | POA: Insufficient documentation

## 2014-02-01 DIAGNOSIS — K219 Gastro-esophageal reflux disease without esophagitis: Secondary | ICD-10-CM

## 2014-02-01 DIAGNOSIS — K5730 Diverticulosis of large intestine without perforation or abscess without bleeding: Secondary | ICD-10-CM | POA: Insufficient documentation

## 2014-02-01 DIAGNOSIS — K319 Disease of stomach and duodenum, unspecified: Secondary | ICD-10-CM | POA: Insufficient documentation

## 2014-02-01 DIAGNOSIS — Z7982 Long term (current) use of aspirin: Secondary | ICD-10-CM | POA: Insufficient documentation

## 2014-02-01 HISTORY — PX: COLONOSCOPY WITH ESOPHAGOGASTRODUODENOSCOPY (EGD): SHX5779

## 2014-02-01 SURGERY — COLONOSCOPY WITH ESOPHAGOGASTRODUODENOSCOPY (EGD)
Anesthesia: Moderate Sedation

## 2014-02-01 MED ORDER — LIDOCAINE VISCOUS 2 % MT SOLN
OROMUCOSAL | Status: DC | PRN
  Administered 2014-02-01: 3 mL via OROMUCOSAL

## 2014-02-01 MED ORDER — MIDAZOLAM HCL 5 MG/5ML IJ SOLN
INTRAMUSCULAR | Status: AC
  Filled 2014-02-01: qty 10

## 2014-02-01 MED ORDER — MEPERIDINE HCL 100 MG/ML IJ SOLN
INTRAMUSCULAR | Status: DC | PRN
  Administered 2014-02-01: 50 mg via INTRAVENOUS

## 2014-02-01 MED ORDER — LIDOCAINE VISCOUS 2 % MT SOLN
OROMUCOSAL | Status: AC
  Filled 2014-02-01: qty 15

## 2014-02-01 MED ORDER — STERILE WATER FOR IRRIGATION IR SOLN
Status: DC | PRN
  Administered 2014-02-01: 13:00:00

## 2014-02-01 MED ORDER — MEPERIDINE HCL 100 MG/ML IJ SOLN
INTRAMUSCULAR | Status: AC
  Filled 2014-02-01: qty 2

## 2014-02-01 MED ORDER — ONDANSETRON HCL 4 MG/2ML IJ SOLN
INTRAMUSCULAR | Status: DC | PRN
  Administered 2014-02-01: 4 mg via INTRAVENOUS

## 2014-02-01 MED ORDER — PROMETHAZINE HCL 25 MG/ML IJ SOLN
INTRAMUSCULAR | Status: AC
  Filled 2014-02-01: qty 1

## 2014-02-01 MED ORDER — PROMETHAZINE HCL 25 MG/ML IJ SOLN: 25.0000 mg | Freq: Once | INTRAMUSCULAR | Status: DC

## 2014-02-01 MED ORDER — SODIUM CHLORIDE 0.9 % IJ SOLN
INTRAMUSCULAR | Status: AC
  Filled 2014-02-01: qty 10

## 2014-02-01 MED ORDER — MIDAZOLAM HCL 5 MG/5ML IJ SOLN
INTRAMUSCULAR | Status: DC | PRN
  Administered 2014-02-01: 2 mg via INTRAVENOUS
  Administered 2014-02-01 (×3): 1 mg via INTRAVENOUS

## 2014-02-01 MED ORDER — PROMETHAZINE HCL 25 MG/ML IJ SOLN
12.5000 mg | Freq: Once | INTRAMUSCULAR | Status: AC
  Administered 2014-02-01: 12.5 mg via INTRAVENOUS

## 2014-02-01 MED ORDER — ONDANSETRON HCL 4 MG/2ML IJ SOLN
INTRAMUSCULAR | Status: AC
  Filled 2014-02-01: qty 2

## 2014-02-01 MED ORDER — SODIUM CHLORIDE 0.9 % IV SOLN
INTRAVENOUS | Status: DC
Start: 2014-02-01 — End: 2014-02-01
  Administered 2014-02-01: 11:00:00 via INTRAVENOUS

## 2014-02-01 NOTE — Op Note (Signed)
[facility] [address] [place], [address]   ENDOSCOPY PROCEDURE REPORT  PATIENT: [name], [name]  MR#: [id number] BIRTHDATE: 12/14/1940 , 72  yrs. old GENDER: Female ENDOSCOPIST: R.  Garfield Cornea, MD FACP [facility] REFERRED BY:  Sinda Du, M.D. PROCEDURE DATE:  02/01/2014 PROCEDURE:     EGD with gastric biopsy  INDICATIONS:    Refractory GERD  INFORMED CONSENT:   The risks, benefits, limitations, alternatives and imponderables have been discussed.  The potential for biopsy, esophogeal dilation, etc. have also been reviewed.  Questions have been answered.  All parties agreeable.  Please see the history and physical in the medical record for more information.  MEDICATIONS:   Versed 2 mg IV and Demerol 50 mg IV. Phenergan 12.5 mg IV. Zofran 4 mg IV. Xylocaine gel.  DESCRIPTION OF PROCEDURE:   The EG-[id number] ([id number])  endoscope was introduced through the mouth and advanced to the second portion of the duodenum without difficulty or limitations.  The mucosal surfaces were surveyed very carefully during advancement of the scope and upon withdrawal.  Retroflexion view of the proximal stomach and esophagogastric junction was performed.      FINDINGS: Single 10 mm distal linear esophageal erosion. Patulous EG junction. No Barrett's esophagus. Stomach empty. Spinal hernia. Multiple 1-3 mm hyperplastic-appearing polyps. Couple of antral erosions. No ulcer or infiltrating process. Patent pylorus. Normal first and second portion of the duodenum  THERAPEUTIC / DIAGNOSTIC MANEUVERS PERFORMED:  one of the polyps was biopsied. The abnormal antrum was also biopsied.   COMPLICATIONS:  None  IMPRESSION:   Mild erosive reflux esophagitis. Patulous EG junction. Gastric polyp-biopsy. Antral erosions-biopsy  RECOMMENDATIONS:   Begin AcipHex 20 mg daily. Follow up on pathology. See colonoscopy report.    _______________________________ R. Garfield Cornea, MD FACP  [facility] eSigned:  R. Garfield Cornea, MD FACP [facility] 02/01/2014 1:05 PM     CC:  PATIENT NAME:  [name], [name] MR#: [id number]

## 2014-02-01 NOTE — Op Note (Signed)
[facility] [address] [place], [address]   COLONOSCOPY PROCEDURE REPORT  PATIENT: [name] [name], [name] [name]  MR#:         [id number] BIRTHDATE: 08/10/1941 , 72  yrs. old GENDER: Female ENDOSCOPIST: R.  Garfield Cornea, MD FACP [facility] REFERRED BY:  Sinda Du, M.D. PROCEDURE DATE:  02/01/2014 PROCEDURE:     Ileocolonoscopy-surveillance  INDICATIONS: History of colonic adenoma  INFORMED CONSENT:  The risks, benefits, alternatives and imponderables including but not limited to bleeding, perforation as well as the possibility of a missed lesion have been reviewed.  The potential for biopsy, lesion removal, etc. have also been discussed.  Questions have been answered.  All parties agreeable. Please see the history and physical in the medical record for more information.  MEDICATIONS: Versed 5 mg IV and Demerol 50 mg IV in divided doses. Phenergan 12.5 mg IV. Zofran 4 mg IV  DESCRIPTION OF PROCEDURE:  After a digital rectal exam was performed, the EG-2990i ([id number]), EC-3890Li ([id number]), and EC-[id number] ([id number])  colonoscope was advanced from the anus through the rectum and colon to the area of the cecum, ileocecal valve and appendiceal orifice.  The cecum was deeply intubated. These structures were well-seen and photographed for the record. From the level of the cecum and ileocecal valve, the scope was slowly and cautiously withdrawn.  The mucosal surfaces were carefully surveyed utilizing scope tip deflection to facilitate fold flattening as needed.  The scope was pulled down into the rectum where a thorough examination including retroflexion was performed.    FINDINGS:  Adequate preparation. Normal rectum. Scattered left-sided diverticula; the remainder of colonic mucosa appeared normal. The distal 5 cm of terminal ileal mucosa also appeared normal.  THERAPEUTIC / DIAGNOSTIC MANEUVERS PERFORMED:  None  COMPLICATIONS: None  CECAL WITHDRAWAL TIME:  7  minutes  IMPRESSION: Colonic diverticulosis.  RECOMMENDATIONS: Repeat colonoscopy in 5 years. See EGD report.   _______________________________ eSigned:  R. Garfield Cornea, MD FACP [facility] 02/01/2014 1:38 PM   CC:    PATIENT NAME:  [name], [name] MR#: [id number]

## 2014-02-01 NOTE — Interval H&P Note (Signed)
History and Physical Interval Note:  02/01/2014 12:45 PM  Norman Herrlich  has presented today for surgery, with the diagnosis of REFRACTORY GERD  AND CONSTIPATION  The various methods of treatment have been discussed with the patient and family. After consideration of risks, benefits and other options for treatment, the patient has consented to  Procedure(s) with comments: COLONOSCOPY WITH ESOPHAGOGASTRODUODENOSCOPY (EGD) (N/A) - 12:15 as a surgical intervention .  The patient's history has been reviewed, patient examined, no change in status, stable for surgery.  I have reviewed the patient's chart and labs.  Questions were answered to the patient's satisfaction.     No change.  EGD and colonoscopy per plan.The risks, benefits, limitations, imponderables and alternatives regarding both EGD and colonoscopy have been reviewed with the patient. Questions have been answered. All parties agreeable.   Manus Rudd

## 2014-02-01 NOTE — Discharge Instructions (Signed)
Colonoscopy Discharge Instructions  Read the instructions outlined below and refer to this sheet in the next few weeks. These discharge instructions provide you with general information on caring for yourself after you leave the hospital. Your doctor may also give you specific instructions. While your treatment has been planned according to the most current medical practices available, unavoidable complications occasionally occur. If you have any problems or questions after discharge, call Dr. Gala Romney at [phone]. ACTIVITY  You may resume your regular activity, but move at a slower pace for the next 24 hours.   Take frequent rest periods for the next 24 hours.   Walking will help get rid of the air and reduce the bloated feeling in your belly (abdomen).   No driving for 24 hours (because of the medicine (anesthesia) used during the test).    Do not sign any important legal documents or operate any machinery for 24 hours (because of the anesthesia used during the test).  NUTRITION  Drink plenty of fluids.   You may resume your normal diet as instructed by your doctor.   Begin with a light meal and progress to your normal diet. Heavy or fried foods are harder to digest and may make you feel sick to your stomach (nauseated).   Avoid alcoholic beverages for 24 hours or as instructed.  MEDICATIONS  You may resume your normal medications unless your doctor tells you otherwise.  WHAT YOU CAN EXPECT TODAY  Some feelings of bloating in the abdomen.   Passage of more gas than usual.   Spotting of blood in your stool or on the toilet paper.  IF YOU HAD POLYPS REMOVED DURING THE COLONOSCOPY:  No aspirin products for 7 days or as instructed.   No alcohol for 7 days or as instructed.   Eat a soft diet for the next 24 hours.  FINDING OUT THE RESULTS OF YOUR TEST Not all test results are available during your visit. If your test results are not back during the visit, make an appointment  with your caregiver to find out the results. Do not assume everything is normal if you have not heard from your caregiver or the medical facility. It is important for you to follow up on all of your test results.  SEEK IMMEDIATE MEDICAL ATTENTION IF:  You have more than a spotting of blood in your stool.   Your belly is swollen (abdominal distention).   You are nauseated or vomiting.   You have a temperature over 101.  You have abdominal pain or discomfort that is severe or gets worse throughout the day. EGD Discharge instructions Please read the instructions outlined below and refer to this sheet in the next few weeks. These discharge instructions provide you with general information on caring for yourself after you leave the hospital. Your doctor may also give you specific instructions. While your treatment has been planned according to the most current medical practices available, unavoidable complications occasionally occur. If you have any problems or questions after discharge, please call your doctor. ACTIVITY You may resume your regular activity but move at a slower pace for the next 24 hours.  Take frequent rest periods for the next 24 hours.  Walking will help expel (get rid of) the air and reduce the bloated feeling in your abdomen.  No driving for 24 hours (because of the anesthesia (medicine) used during the test).  You may shower.  Do not sign any important legal documents or operate any machinery for 24  hours (because of the anesthesia used during the test).  NUTRITION Drink plenty of fluids.  You may resume your normal diet.  Begin with a light meal and progress to your normal diet.  Avoid alcoholic beverages for 24 hours or as instructed by your caregiver.  MEDICATIONS You may resume your normal medications unless your caregiver tells you otherwise.  WHAT YOU CAN EXPECT TODAY You may experience abdominal discomfort such as a feeling of fullness or gas pains.   FOLLOW-UP Your doctor will discuss the results of your test with you.  SEEK IMMEDIATE MEDICAL ATTENTION IF ANY OF THE FOLLOWING OCCUR: Excessive nausea (feeling sick to your stomach) and/or vomiting.  Severe abdominal pain and distention (swelling).  Trouble swallowing.  Temperature over 101 F (37.8 C).  Rectal bleeding or vomiting of blood.    GERD and diverticulosis information provided  Begin AcipHex 20 mg daily  Repeat screening colonoscopy in 5 years  Further recommendations to follow pending review of pathology report  Gastroesophageal Reflux Disease, Adult Gastroesophageal reflux disease (GERD) happens when acid from your stomach flows up into the esophagus. When acid comes in contact with the esophagus, the acid causes soreness (inflammation) in the esophagus. Over time, GERD may create small holes (ulcers) in the lining of the esophagus. CAUSES   Increased body weight. This puts pressure on the stomach, making acid rise from the stomach into the esophagus.  Smoking. This increases acid production in the stomach.  Drinking alcohol. This causes decreased pressure in the lower esophageal sphincter (valve or ring of muscle between the esophagus and stomach), allowing acid from the stomach into the esophagus.  Late evening meals and a full stomach. This increases pressure and acid production in the stomach.  A malformed lower esophageal sphincter. Sometimes, no cause is found. SYMPTOMS   Burning pain in the lower part of the mid-chest behind the breastbone and in the mid-stomach area. This may occur twice a week or more often.  Trouble swallowing.  Sore throat.  Dry cough.  Asthma-like symptoms including chest tightness, shortness of breath, or wheezing. DIAGNOSIS  Your caregiver may be able to diagnose GERD based on your symptoms. In some cases, X-rays and other tests may be done to check for complications or to check the condition of your stomach and  esophagus. TREATMENT  Your caregiver may recommend over-the-counter or prescription medicines to help decrease acid production. Ask your caregiver before starting or adding any new medicines.  HOME CARE INSTRUCTIONS   Change the factors that you can control. Ask your caregiver for guidance concerning weight loss, quitting smoking, and alcohol consumption.  Avoid foods and drinks that make your symptoms worse, such as:  Caffeine or alcoholic drinks.  Chocolate.  Peppermint or mint flavorings.  Garlic and onions.  Spicy foods.  Citrus fruits, such as oranges, lemons, or limes.  Tomato-based foods such as sauce, chili, salsa, and pizza.  Fried and fatty foods.  Avoid lying down for the 3 hours prior to your bedtime or prior to taking a nap.  Eat small, frequent meals instead of large meals.  Wear loose-fitting clothing. Do not wear anything tight around your waist that causes pressure on your stomach.  Raise the head of your bed 6 to 8 inches with wood blocks to help you sleep. Extra pillows will not help.  Only take over-the-counter or prescription medicines for pain, discomfort, or fever as directed by your caregiver.  Do not take aspirin, ibuprofen, or other nonsteroidal anti-inflammatory drugs (NSAIDs). SEEK  IMMEDIATE MEDICAL CARE IF:   You have pain in your arms, neck, jaw, teeth, or back.  Your pain increases or changes in intensity or duration.  You develop nausea, vomiting, or sweating (diaphoresis).  You develop shortness of breath, or you faint.  Your vomit is green, yellow, black, or looks like coffee grounds or blood.  Your stool is red, bloody, or black. These symptoms could be signs of other problems, such as heart disease, gastric bleeding, or esophageal bleeding. MAKE SURE YOU:   Understand these instructions.  Will watch your condition.  Will get help right away if you are not doing well or get worse.   Diverticulosis Diverticulosis is a  common condition that develops when small pouches (diverticula) form in the wall of the colon. The risk of diverticulosis increases with age. It happens more often in people who eat a low-fiber diet. Most individuals with diverticulosis have no symptoms. Those individuals with symptoms usually experience abdominal pain, constipation, or loose stools (diarrhea). HOME CARE INSTRUCTIONS   Increase the amount of fiber in your diet as directed by your caregiver or dietician. This may reduce symptoms of diverticulosis.  Your caregiver may recommend taking a dietary fiber supplement.  Drink at least 6 to 8 glasses of water each day to prevent constipation.  Try not to strain when you have a bowel movement.  Your caregiver may recommend avoiding nuts and seeds to prevent complications, although this is still an uncertain benefit.  Only take over-the-counter or prescription medicines for pain, discomfort, or fever as directed by your caregiver. FOODS WITH HIGH FIBER CONTENT INCLUDE:  Fruits. Apple, peach, pear, tangerine, raisins, prunes.  Vegetables. Brussels sprouts, asparagus, broccoli, cabbage, carrot, cauliflower, romaine lettuce, spinach, summer squash, tomato, winter squash, zucchini.  Starchy Vegetables. Baked beans, kidney beans, lima beans, split peas, lentils, potatoes (with skin).  Grains. Whole wheat bread, brown rice, bran flake cereal, plain oatmeal, white rice, shredded wheat, bran muffins. SEEK IMMEDIATE MEDICAL CARE IF:   You develop increasing pain or severe bloating.  You have an oral temperature above 102 F (38.9 C), not controlled by medicine.  You develop vomiting or bowel movements that are bloody or black.

## 2014-02-01 NOTE — H&P (View-Only) (Signed)
Primary Care Physician: Alonza Bogus, MD  Primary Gastroenterologist:  Garfield Cornea, MD   Chief Complaint  Patient presents with  . Follow-up    HPI: [name] [name] is a 73 y.o. female here for f/u. Last seen in 09/2013. h/o adenomatous colon polyps, constipation, bloating, heartburn. At last OV she was not prepared to have EGD/TCS. She tried Dexilant did not tolerate due to abdominal pain. Burning in upper chest/bloating most days. Coffee 2-3 per day. Symptoms afterwards. No dysphagia. No vomiting. Bloating in abdomen. BM varies. May have BM every 2-3 days. Increased dietary fiber and added probiotics. Stools are not hard. No melena, brbpr.   Current Outpatient Prescriptions  Medication Sig Dispense Refill  . aspirin 81 MG tablet Take 81 mg by mouth daily.      . BuPROPion HBr 522 MG TB24 Take 1 tablet by mouth daily.      . cyclobenzaprine (FLEXERIL) 10 MG tablet Take 10 mg by mouth as needed for muscle spasms.       Marland Kitchen docusate sodium (COLACE) 100 MG capsule Take 100 mg by mouth daily.      . DULoxetine (CYMBALTA) 60 MG capsule Take 120 mg by mouth daily.       Marland Kitchen estradiol (ESTRACE) 1 MG tablet Take 0.5 tablets (0.5 mg total) by mouth daily. Take 1/2 tablet po daily  30 tablet  6  . lamoTRIgine (LAMICTAL) 150 MG tablet Take 150 mg by mouth at bedtime.      Marland Kitchen levothyroxine (LEVOTHROID) 25 MCG tablet Take 1 tablet (25 mcg total) by mouth daily.  30 tablet  4  . lidocaine (LIDODERM) 5 % Place 1-3 patches onto the skin as needed. Remove & Discard patch within 12 hours or as directed by MD      . losartan (COZAAR) 50 MG tablet Take 50 mg by mouth daily.      . meloxicam (MOBIC) 7.5 MG tablet Take 7.5 mg by mouth 2 (two) times daily.       . Multiple Vitamin (MULTIVITAMIN) tablet Take 1 tablet by mouth daily.        . niacin (SLO-NIACIN) 500 MG tablet Take 500 mg by mouth daily.       Marland Kitchen NITROSTAT 0.4 MG SL tablet PLACE ONE TABLET UNDER TONGUE EVERY 5 MIN UP TO 3 DOSES AS NEEDED  FORCHEST PAIN.  25 each  3  . Omega-3 Fatty Acids (FISH OIL) 1000 MG CAPS Take 1 capsule by mouth daily.       . pantoprazole (PROTONIX) 40 MG tablet Take 40 mg by mouth daily.       . polyethylene glycol powder (GLYCOLAX/MIRALAX) powder One capful (17grams) once daily for constipation as needed.  527 g  11  . RESTASIS 0.05 % ophthalmic emulsion Place 1 drop into both eyes 2 (two) times daily.       . risperiDONE (RISPERDAL) 1 MG tablet Take 0.5 mg by mouth at bedtime.       . simvastatin (ZOCOR) 40 MG tablet Take 0.5 tablets (20 mg total) by mouth at bedtime.  90 tablet  1  . zolpidem (AMBIEN) 10 MG tablet Take 10 mg by mouth at bedtime as needed for sleep.       Marland Kitchen ALPRAZolam (XANAX) 0.5 MG tablet Take 1 tablet by mouth daily as needed. anxiety      . Cholecalciferol (VITAMIN D-3) 5000 UNITS TABS Take 1 capsule by mouth every other day.      Marland Kitchen  Probiotic Product (PROBIOTIC DAILY PO) Take 1 tablet by mouth daily. Florify       No current facility-administered medications for this visit.    Allergies as of 01/18/2014 - Review Complete 01/18/2014  Allergen Reaction Noted  . Hydrocodone-acetaminophen  07/05/2008  . Vicodin [hydrocodone-acetaminophen] Itching 06/26/2013   Past Medical History  Diagnosis Date  . CAD (coronary artery disease)   . Chest pain   . Dizziness   . Dyslipidemia   . GERD (gastroesophageal reflux disease)   . Bipolar affective disorder   . Fibromyalgia   . H/O: hysterectomy   . Hyperlipidemia   . Arthritis   . Chronic fatigue   . Hypertension   . Hx of adenomatous colonic polyps 2005   Past Surgical History  Procedure Laterality Date  . 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  Remainder of colonic mucosa was normal  . [place] bunionectomy      bilateral  . Esophagogastroduodenoscopy      followed by colonoscopy with snare polypectomy  . Cystectomy      pilonidial cyst    . Breast lumpectomy    . Spinal fusion    . Right knee replacement  02/2013  . Breast biopsy Left 1989  . Shoulder arthroscopy Left 2005  . Bunionectomy Left 2005  . Spinal fusion  2004    L4-L5  . Total hip arthroplasty Right 06/2006  . Total shoulder replacement Left 08/2005  . Total hip arthroplasty  8/12  . Abdominal hysterectomy  1994    TAH- DUB, BSO  . Shoulder arthroscopy Right     ROS:  General: Negative for anorexia, weight loss, fever, chills, fatigue, weakness. ENT: Negative for hoarseness, difficulty swallowing , nasal congestion. CV: Negative for chest pain, angina, palpitations, dyspnea on exertion, peripheral edema.  Respiratory: Negative for dyspnea at rest, dyspnea on exertion, cough, sputum, wheezing.  GI: See history of present illness. GU:  Negative for dysuria, hematuria, urinary incontinence, urinary frequency, nocturnal urination.  Endo: Negative for unusual weight change.    Physical Examination:   BP 113/70  Pulse 77  Temp(Src) 97.3 F (36.3 C) (Oral)  Ht 4\' 10"  (1.473 m)  Wt 127 lb 12.8 oz (57.97 kg)  BMI 26.72 kg/m2  LMP 02/10/1993  General: Well-nourished, well-developed in no acute distress.  Eyes: No icterus. Mouth: Oropharyngeal mucosa moist and pink , no lesions erythema or exudate. Lungs: Clear to auscultation bilaterally.  Heart: Regular rate and rhythm, no murmurs rubs or gallops.  Abdomen: Bowel sounds are normal, nontender, nondistended, no hepatosplenomegaly or masses, no abdominal bruits or hernia , no rebound or guarding.   Extremities: No lower extremity edema. No clubbing or deformities. Neuro: Alert and oriented x 4   Skin: Warm and dry, no jaundice.   Psych: Alert and cooperative, normal mood and affect.    Imaging Studies: No results found.

## 2014-02-07 ENCOUNTER — Encounter: Payer: Self-pay | Admitting: Internal Medicine

## 2014-02-10 ENCOUNTER — Encounter (HOSPITAL_COMMUNITY): Payer: Self-pay | Admitting: Internal Medicine

## 2014-02-11 ENCOUNTER — Other Ambulatory Visit: Payer: Self-pay | Admitting: Physician Assistant

## 2014-02-23 ENCOUNTER — Other Ambulatory Visit: Payer: Self-pay | Admitting: Certified Nurse Midwife

## 2014-02-23 NOTE — Telephone Encounter (Signed)
Spoke with [name] [name] - medical records. She states she will fax Lab results to [place].  - Waiting on lab results to confirm with Ms [name] [name] patient doesn't need to change Rx.

## 2014-02-23 NOTE — Telephone Encounter (Signed)
Called Dr Luan Pulling [phone] - Unable to contact anybody. Will try to call later today.

## 2014-02-23 NOTE — Telephone Encounter (Signed)
Patient called back. She states she had labs done at PCP. Advised pt I will call PCP and have them send TSH results. I told her that I will call her back if I need a release form. - Patient agreed.

## 2014-02-23 NOTE — Telephone Encounter (Addendum)
Last TSH check 08/2013 - Pt to recheck TSH in 2 months at [facility] in [place]. - We do not have any results.  Last AEX 07/2013 Last refill 09/02/2013 #30/4 refills Next appt 08/02/2014.   LM for pt re: Rx request from pharmacy. Did she recheck TSH 10/2013? We need Lab results if not, she needs to come in have that done.

## 2014-02-26 NOTE — Telephone Encounter (Signed)
Called patient and she stated she was going to be out of pills today and her PCP closes at 12 today Friday. - Talked with Ms Jackelyn Poling - Pharmacy is able to give her some pills until she can get in touch with PCP and get Rx refill from them. Advised patient following up with PCP is the easiest way at this point because she had TSH check with them already and they are going to let her know when she needs TSH check again.- Patient agreed and verbalized understanding.   -Routed to Ms Jackelyn Poling for final review. - Encounter closed.

## 2014-02-26 NOTE — Telephone Encounter (Addendum)
LM for pt to call back re: Rx request.  We have not receive any faxes from PCP. - Easiest way is to let patient call PCP and get Rx refill from them.  - Suggestion per Ms Jackelyn Poling

## 2014-02-26 NOTE — Telephone Encounter (Signed)
Returning a call to [name] [name] °

## 2014-07-08 ENCOUNTER — Other Ambulatory Visit: Payer: Self-pay | Admitting: Cardiovascular Disease

## 2014-08-02 ENCOUNTER — Ambulatory Visit (INDEPENDENT_AMBULATORY_CARE_PROVIDER_SITE_OTHER): Payer: Medicare Other | Admitting: Certified Nurse Midwife

## 2014-08-02 ENCOUNTER — Encounter: Payer: Self-pay | Admitting: Certified Nurse Midwife

## 2014-08-02 VITALS — BP 118/70 | HR 70 | Resp 12 | Ht <= 58 in | Wt 134.0 lb

## 2014-08-02 DIAGNOSIS — E039 Hypothyroidism, unspecified: Secondary | ICD-10-CM

## 2014-08-02 DIAGNOSIS — Z01419 Encounter for gynecological examination (general) (routine) without abnormal findings: Secondary | ICD-10-CM

## 2014-08-02 MED ORDER — LEVOTHYROXINE SODIUM 25 MCG PO TABS: 25.0000 ug | ORAL_TABLET | Freq: Every day | ORAL | Status: DC

## 2014-08-02 NOTE — Patient Instructions (Signed)

## 2014-08-02 NOTE — Progress Notes (Signed)
73 y.o. G19P2002 Widowed Caucasian Fe here for annual exam. Menopausal on HRT, denies any vaginal bleeding or vaginal dryness. Patient still distraught about the death of her spouse 2 years ago this month. Has social outings with friends, but not the same. Family visits, but just can't help missing him. No thoughts of self harm or others, but is ready to be with him. Patient on Cymbalta and Wellbutrin per PCP. Discussed this with PCP and was encouraged to increase exercise. Patient has had a visit with Doristine Bosworth and seem to help. Thyroid medication working well, brought TSH results per PCP 2.967. Eating well and sleeping well. No other health issues today.  Patient's last menstrual period was 02/10/1993.          Sexually active: No.  The current method of family planning is status post hysterectomy.    Exercising: No.  exercise Smoker:  no  Health Maintenance: Pap: 8-25-03neg MMG:  10/ 2014 neg per patient Colonoscopy:  2015 per patient negative BMD:   2009, pt unsure if she had one after then TDaP:  2014 Labs: none Self breast exam:  Done occ   reports that she has never smoked. She has never used smokeless tobacco. She reports that she drinks about 3.5 ounces of alcohol per week. She reports that she does not use illicit drugs.  Past Medical History  Diagnosis Date  . CAD (coronary artery disease)   . Chest pain   . Dizziness   . Dyslipidemia   . GERD (gastroesophageal reflux disease)   . Bipolar affective disorder   . Fibromyalgia   . H/O: hysterectomy   . Hyperlipidemia   . Arthritis   . Chronic fatigue   . Hypertension   . Hx of adenomatous colonic polyps 2005    Past Surgical History  Procedure Laterality Date  . 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  Remainder of colonic mucosa was normal  . [place] bunionectomy      bilateral  . Esophagogastroduodenoscopy      followed by  colonoscopy with snare polypectomy  . Cystectomy      pilonidial cyst  . Breast lumpectomy    . Spinal fusion    . Right knee replacement  02/2013  . Breast biopsy Left 1989  . Shoulder arthroscopy Left 2005  . Bunionectomy Left 2005  . Spinal fusion  2004    L4-L5  . Total hip arthroplasty Right 06/2006  . Total shoulder replacement Left 08/2005  . Total hip arthroplasty  8/12  . Abdominal hysterectomy  1994    TAH- DUB, BSO  . Shoulder arthroscopy Right   . Colonoscopy with esophagogastroduodenoscopy (egd) N/A 02/01/2014    Procedure: COLONOSCOPY WITH ESOPHAGOGASTRODUODENOSCOPY (EGD);  Surgeon: Daneil Dolin, MD;  Location: [facility];  Service: Endoscopy;  Laterality: N/A;  12:15    Current Outpatient Prescriptions  Medication Sig Dispense Refill  . ALPRAZolam (XANAX) 0.5 MG tablet Take 1 tablet by mouth daily as needed. anxiety      . aspirin 81 MG tablet Take 81 mg by mouth daily.      . Cholecalciferol (VITAMIN D PO) Take 2,000 Int'l Units by mouth daily.      . cyclobenzaprine (FLEXERIL) 10 MG tablet Take 10 mg by mouth as needed for muscle spasms.       Marland Kitchen docusate sodium (COLACE) 100 MG capsule Take 100 mg by mouth daily.      Marland Kitchen  DULoxetine (CYMBALTA) 60 MG capsule Take 120 mg by mouth daily.       Marland Kitchen estradiol (ESTRACE) 1 MG tablet Take 0.5 tablets (0.5 mg total) by mouth daily. Take 1/2 tablet po daily  30 tablet  6  . lamoTRIgine (LAMICTAL) 150 MG tablet Take 150 mg by mouth at bedtime.      Marland Kitchen levothyroxine (LEVOTHROID) 25 MCG tablet Take 1 tablet (25 mcg total) by mouth daily.  30 tablet  12  . lidocaine (LIDODERM) 5 % Place 1-3 patches onto the skin as needed. Remove & Discard patch within 12 hours or as directed by MD      . losartan (COZAAR) 50 MG tablet Take 50 mg by mouth daily.      . meloxicam (MOBIC) 7.5 MG tablet Take 7.5 mg by mouth 2 (two) times daily.       . Multiple Vitamin (MULTIVITAMIN) tablet Take 1 tablet by mouth daily.        . niacin (SLO-NIACIN)  500 MG tablet Take 500 mg by mouth daily.       . Omega-3 Fatty Acids (FISH OIL) 1000 MG CAPS Take 1 capsule by mouth daily.       . polyethylene glycol powder (GLYCOLAX/MIRALAX) powder One capful (17grams) once daily for constipation as needed.  527 g  11  . Probiotic Product (PROBIOTIC DAILY PO) Take 1 tablet by mouth daily. Florify      . RABEprazole (ACIPHEX) 20 MG tablet       . RESTASIS 0.05 % ophthalmic emulsion Place 1 drop into both eyes 2 (two) times daily.       . risperiDONE (RISPERDAL) 1 MG tablet Take 0.5 mg by mouth at bedtime.       . simvastatin (ZOCOR) 20 MG tablet TAKE (1) TABLET BY MOUTH AT BEDTIME.  30 tablet  5  . WELLBUTRIN XL 300 MG 24 hr tablet daily.      Marland Kitchen zolpidem (AMBIEN) 10 MG tablet Take 10 mg by mouth at bedtime as needed for sleep.       Marland Kitchen amoxicillin (AMOXIL) 500 MG tablet Take 500 mg by mouth 2 (two) times daily. Takes 4 tablets before dental procedures.      Marland Kitchen NITROSTAT 0.4 MG SL tablet PLACE 1 TAB UNDER TONGUE EVERY 5 MIN IF NEEDED FOR CHEST PAIN. MAY USE 3 TIMES.NO RELIEF CALL 911.  25 tablet  0   No current facility-administered medications for this visit.    Family History  Problem Relation Age of Onset  . Heart disease Mother   . Heart attack Mother   . Heart disease Father   . Heart attack Father   . Stroke Sister   . Stroke Maternal Grandfather   . Cancer Paternal Grandmother     unknown primary  . Colon cancer Neg Hx     ROS:  Pertinent items are noted in HPI.  Otherwise, a comprehensive ROS was negative.  Exam:   BP 118/70  Pulse 70  Resp 12  Ht 4' 9.75" (1.467 m)  Wt 134 lb (60.782 kg)  BMI 28.24 kg/m2  LMP 02/10/1993 Height: 4' 9.75" (146.7 cm)  Ht Readings from Last 3 Encounters:  08/02/14 4' 9.75" (1.467 m)  02/01/14 4\' 10"  (1.473 m)  02/01/14 4\' 10"  (1.473 m)    General appearance: alert, cooperative and appears stated age Head: Normocephalic, without obvious abnormality, atraumatic Neck: no adenopathy, supple,  symmetrical, trachea midline and thyroid normal to inspection and palpation Lungs: clear to  auscultation bilaterally Breasts: normal appearance, no masses or tenderness, No nipple retraction or dimpling, No nipple discharge or bleeding, No axillary or supraclavicular adenopathy Heart: regular rate and rhythm Abdomen: soft, non-tender; no masses,  no organomegaly Extremities: extremities normal, atraumatic, no cyanosis or edema Skin: Skin color, texture, turgor normal. No rashes or lesions Lymph nodes: Cervical, supraclavicular, and axillary nodes normal. No abnormal inguinal nodes palpated Neurologic: Grossly normal   Pelvic: External genitalia:  no lesions              Urethra:  normal appearing urethra with no masses, tenderness or lesions              Bartholin's and Skene's: normal                 Vagina:atropic appearing vagina with normal color and discharge, no lesions, some moisture              Cervix: absent              Pap taken: No. Bimanual Exam:  Uterus:  uterus absent              Adnexa: no mass, fullness, tenderness and adnexal absent bilateral               Rectovaginal: Confirms               Anus:  normal sphincter tone, no lesions  A:  Well Woman with normal exam  Menopausal on HRT  Hypothyroid stable medication, needs refill on Rx  Depression/anxiety/hypertension management per PCP   Cholesterol management per cardiology  P:   Reviewed health and wellness pertinent to exam  Discussed stopping Estrace due to age and feel risks out weigh benefits at this point. Patient agreeable, will wean down on current supply and stop.  Rx Levothroid 25 mcg see order  Continue follow up with MD as indicated  Encouraged patient to "Shine in the light of friends and family and church", to schedule activities ahead of time so she can look forward to events to fill her hours and to engage with a conversation regarding her late spouse often with joyful memories. Patient feels this  is "good advice" and will try. If thoughts of self harm call 911 .  Pap smear not taken today   counseled on breast self exam, mammography screening, adequate intake of calcium and vitamin D, diet and exercise  return annually or prn  An After Visit Summary was printed and given to the patient.

## 2014-08-04 NOTE — Progress Notes (Signed)
Reviewed personally.  M. Suzanne Vyncent Overby, MD.  

## 2014-08-14 ENCOUNTER — Other Ambulatory Visit: Payer: Self-pay | Admitting: Physician Assistant

## 2014-08-30 ENCOUNTER — Other Ambulatory Visit: Payer: Self-pay

## 2014-08-30 MED ORDER — RABEPRAZOLE SODIUM 20 MG PO TBEC: 20.0000 mg | DELAYED_RELEASE_TABLET | Freq: Every day | ORAL | Status: DC

## 2014-09-13 ENCOUNTER — Encounter: Payer: Self-pay | Admitting: Certified Nurse Midwife

## 2014-10-08 ENCOUNTER — Other Ambulatory Visit: Payer: Self-pay | Admitting: Internal Medicine

## 2014-11-10 ENCOUNTER — Other Ambulatory Visit: Payer: Self-pay | Admitting: Internal Medicine

## 2014-11-19 ENCOUNTER — Ambulatory Visit: Payer: Medicare Other | Admitting: Gastroenterology

## 2014-11-23 ENCOUNTER — Ambulatory Visit (INDEPENDENT_AMBULATORY_CARE_PROVIDER_SITE_OTHER): Payer: Medicare Other | Admitting: Gastroenterology

## 2014-11-23 ENCOUNTER — Encounter: Payer: Self-pay | Admitting: Gastroenterology

## 2014-11-23 VITALS — BP 140/79 | HR 71 | Temp 98.4°F | Ht <= 58 in | Wt 135.8 lb

## 2014-11-23 DIAGNOSIS — R05 Cough: Secondary | ICD-10-CM

## 2014-11-23 DIAGNOSIS — R053 Chronic cough: Secondary | ICD-10-CM

## 2014-11-23 DIAGNOSIS — K2100 Gastro-esophageal reflux disease with esophagitis, without bleeding: Secondary | ICD-10-CM

## 2014-11-23 DIAGNOSIS — R159 Full incontinence of feces: Secondary | ICD-10-CM

## 2014-11-23 DIAGNOSIS — K5909 Other constipation: Secondary | ICD-10-CM

## 2014-11-23 HISTORY — DX: Chronic cough: R05.3

## 2014-11-23 HISTORY — DX: Full incontinence of feces: R15.9

## 2014-11-23 MED ORDER — RABEPRAZOLE SODIUM 20 MG PO TBEC: 20.0000 mg | DELAYED_RELEASE_TABLET | Freq: Two times a day (BID) | ORAL | Status: DC

## 2014-11-23 NOTE — Patient Instructions (Addendum)
1. Please increase your Aciphex (Rabeprazole) to 20mg  before breakfast and 20mg  before evening meal. Continue for at least two months to see if it helps with your reflux, bloating, cough. 2. I would recommend you take calcium 1200mg  with vitamin D daily given history of osteopenia and long-term acid reflux medication.  3. You need to try and have a bowel movement at least every two days. You can adjust your regimen accordingly. Consider continue Colace one daily, Miralax one capful at bedtime on days you do not have a good bowel movement.  4. You may find your incontinence is worse when you are constipated AND if your stool is too soft. We will need to work to find the appropriate combination to keep stools regular but more formed.  5. Please keep a stool diary, document when you have bowel movement, the consistency of the stool (hard or soft), and document your episodes of fecal incontinence.  6. Recommend a CXR to evaluate your chronic cough and shortness of breath.  7. Return to the office in 3 months.

## 2014-11-23 NOTE — Progress Notes (Signed)
Primary Care Physician: Alonza Bogus, MD  Primary Gastroenterologist:  Garfield Cornea, MD   Chief Complaint  Patient presents with  . Abdominal Pain  . Constipation    HPI: [name] [name] is a 74 y.o. female here with complaints of constipation with alternating diarrhea, fecal incontinence, abdominal pain, reflux.  Weight is up 8 pounds since March. Seen in the office back at that time due for surveillance colonoscopy for history of adenomatous colon polyps, constipation, bloating, refractory GERD. GB showed mild erosive reflux esophagitis, gastric polyp was fundic gland, antral erosions, biopsy negative. Colonoscopy showed scattered left-sided diverticulosis, normal distal 5 cm of the terminal ileum. Next colonoscopy due in March 2020.  She had a normal CBC except for slightly elevated eosinophil count of 8%, normal LFTs, normal TSH back in September 2015.  Still burping a lot, coughing a lot. Cough seemed to be more progressive after she had a bad cold in November. Has a lot of coughing at nighttime and when she first wakes up. Has not been evaluated by her PCP. No recent chest x-ray. Never a smoker. Complains of some dyspnea on exertion and wonders if her left shoulder pain is related to reflux. Multiple questions regarding reflux, medication, vitamin deficiencies answered. With regards to her reflux she is having a lot of burning in her throat area. Denies any dysphagia. Currently on AcipHex 20 mg daily, had been on pantoprazole prior to her EGD nearly a year ago. She continues to have issues with constipation. Takes Colace 100 mg daily. If she takes twice daily she develops diarrhea. Takes MiraLAX some days but not regularly. Takes milk of magnesia if she has not had a bowel movement in 3 days. Complains of episodes of incontinence to stool requiring clothing change, associated with solid or loose stools. Last episode about 8 weeks ago. Prior to this was happening every couple of  weeks. Denies any melena rectal bleeding.   History of mild osteopenia. Does not take her calcium regularly.     Current Outpatient Prescriptions  Medication Sig Dispense Refill  . ALPRAZolam (XANAX) 0.5 MG tablet Take 1 tablet by mouth daily as needed. anxiety    . aspirin 81 MG tablet Take 81 mg by mouth daily.    . Cholecalciferol (VITAMIN D PO) Take 2,000 Int'l Units by mouth daily.    Marland Kitchen docusate sodium (COLACE) 100 MG capsule Take 100 mg by mouth daily.    . DULoxetine (CYMBALTA) 60 MG capsule Take 120 mg by mouth daily.     Marland Kitchen estradiol (ESTRACE) 1 MG tablet Take 0.5 tablets (0.5 mg total) by mouth daily. Take 1/2 tablet po daily 30 tablet 6  . lamoTRIgine (LAMICTAL) 150 MG tablet Take 150 mg by mouth at bedtime.    Marland Kitchen levothyroxine (LEVOTHROID) 25 MCG tablet Take 1 tablet (25 mcg total) by mouth daily. 30 tablet 12  . lidocaine (LIDODERM) 5 % Place 1-3 patches onto the skin as needed. Remove & Discard patch within 12 hours or as directed by MD    . losartan (COZAAR) 50 MG tablet Take 50 mg by mouth daily.    . meloxicam (MOBIC) 7.5 MG tablet Take 7.5 mg by mouth 2 (two) times daily.     . Multiple Vitamin (MULTIVITAMIN) tablet Take 1 tablet by mouth daily.      . niacin (SLO-NIACIN) 500 MG tablet Take 500 mg by mouth daily.     Marland Kitchen NITROSTAT 0.4 MG SL tablet PLACE 1 TAB UNDER  TONGUE EVERY 5 MIN IF NEEDED FOR CHEST PAIN. MAY USE 3 TIMES.NO RELIEF CALL 911. 25 tablet 0  . Omega-3 Fatty Acids (FISH OIL) 1000 MG CAPS Take 1 capsule by mouth daily.     . polyethylene glycol powder (GLYCOLAX/MIRALAX) powder One capful (17grams) once daily for constipation as needed. 527 g 11  . Probiotic Product (PROBIOTIC DAILY PO) Take 1 tablet by mouth daily. Florify    . RABEprazole (ACIPHEX) 20 MG tablet Take 1 tablet (20 mg total) by mouth daily. 30 tablet 11  . RESTASIS 0.05 % ophthalmic emulsion Place 1 drop into both eyes 2 (two) times daily.     . risperiDONE (RISPERDAL) 1 MG tablet Take 0.5 mg by  mouth at bedtime.     . simvastatin (ZOCOR) 20 MG tablet TAKE (1) TABLET BY MOUTH AT BEDTIME. 30 tablet 0  . WELLBUTRIN XL 300 MG 24 hr tablet daily.    Marland Kitchen zolpidem (AMBIEN) 10 MG tablet Take 10 mg by mouth at bedtime as needed for sleep.      No current facility-administered medications for this visit.    Allergies as of 11/23/2014 - Review Complete 11/23/2014  Allergen Reaction Noted  . Hydrocodone-acetaminophen  07/05/2008  . Vicodin [hydrocodone-acetaminophen] Itching 06/26/2013   Past Medical History  Diagnosis Date  . CAD (coronary artery disease)   . Chest pain   . Dizziness   . Dyslipidemia   . GERD (gastroesophageal reflux disease)   . Bipolar affective disorder   . Fibromyalgia   . H/O: hysterectomy   . Hyperlipidemia   . Arthritis   . Chronic fatigue   . Hypertension   . Hx of adenomatous colonic polyps 2005   Past Surgical History  Procedure Laterality Date  . 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  Remainder of colonic mucosa was normal  . [place] bunionectomy      bilateral  . Esophagogastroduodenoscopy      followed by colonoscopy with snare polypectomy  . Cystectomy      pilonidial cyst  . Breast lumpectomy    . Spinal fusion    . Right knee replacement  02/2013  . Breast biopsy Left 1989  . Shoulder arthroscopy Left 2005  . Bunionectomy Left 2005  . Spinal fusion  2004    L4-L5  . Total hip arthroplasty Right 06/2006  . Total shoulder replacement Left 08/2005  . Total hip arthroplasty  8/12  . Abdominal hysterectomy  1994    TAH- DUB, BSO  . Shoulder arthroscopy Right   . Colonoscopy with esophagogastroduodenoscopy (egd) N/A 02/01/2014    PQZ:RAQT erosive reflux/gastric polyp/normal rectum/scattered left sided diverticula, normal distal TI. gastric bx negative, fundic gland polyp. next TCS 01/2019.    ROS:  General: Negative for anorexia, weight loss, fever,  chills, fatigue, weakness. ENT: Negative for hoarseness, difficulty swallowing , nasal congestion. CV: Negative for chest pain, angina, palpitations,  peripheral edema.  Respiratory: Negative for dyspnea at rest,  sputum, wheezing. See hpi. GI: See history of present illness. GU:  Negative for dysuria, hematuria, urinary incontinence, urinary frequency, nocturnal urination.  Endo: Negative for unusual weight change.    Physical Examination:   BP 140/79 mmHg  Pulse 71  Temp(Src) 98.4 F (36.9 C) (Oral)  Ht 4\' 10"  (1.473 m)  Wt 135 lb 12.8 oz (61.598 kg)  BMI 28.39 kg/m2  LMP 02/10/1993  General: Well-nourished, well-developed in no acute distress.  Eyes: No icterus.  Mouth: Oropharyngeal mucosa moist and pink , no lesions erythema or exudate. Lungs: Clear to auscultation bilaterally.  Heart: Regular rate and rhythm, no murmurs rubs or gallops.  Abdomen: Bowel sounds are normal, nontender, nondistended, no hepatosplenomegaly or masses, no abdominal bruits or hernia , no rebound or guarding. RECTAL: soft stool noted externally, at least a tablespoon worth, small excoriation noted in the gluteal cleft. Anorectal tone lax with squeeze. No masses or stool in rectal vault. No gross blood noted.    Extremities: No lower extremity edema. No clubbing or deformities. Neuro: Alert and oriented x 4   Skin: Warm and dry, no jaundice.   Psych: Alert and cooperative, normal mood and affect.    Impression/plan: 74 year old lady here for follow-up visit. Recent EGD and colonoscopy for refractory GERD and surveillance for colon adenoma. She had mild erosive reflux esophagitis on EGD. Also with antral gastritis. Patient complains of ongoing belching. She also has burning up into her throat. Continues with regurgitation. She is concerned that the cough that she developed approximately 2 months ago is related to her reflux. Multiple questions and concerns regarding her procedures, PPI therapy, risk and  benefits discussed at length with patient. We will increase her AcipHex for possible LPR however and junctional like to have a chest x-ray to evaluate her shortness of breath and cough. Given her concerns regarding risk for osteoporosis/inadequate absorption on PPI therapy, she will take calcium 1200 mg daily with vitamin D (patient states she has osteopenia). She also has a problem with fecal incontinence notable over the past 9 months. Was not present prior to her colonoscopy. Evidence of significant fecal soilage on exam today patient was not aware of. Anorectal tone somewhat lax. She alternates between constipation and loose stool, cannot exclude possibility of spurious diarrhea. She'll begin instructions as below.    1. Please increase your Aciphex (Rabeprazole) to 20mg  before breakfast and 20mg  before evening meal. Continue for at least two months to see if it helps with your reflux, bloating, cough. 2. I would recommend you take calcium 1200mg  with vitamin D daily given history of osteopenia and long-term acid reflux medication.  3. You need to try and have a bowel movement at least every two days. You can adjust your regimen accordingly. Consider continue Colace one daily, Miralax one capful at bedtime on days you do not have a good bowel movement.  4. You may find your incontinence is worse when you are constipated AND if your stool is too soft. We will need to work to find the appropriate combination to keep stools regular but more formed.  5. Please keep a stool diary, document when you have bowel movement, the consistency of the stool (hard or soft), and document your episodes of fecal incontinence.  6. Recommend a CXR to evaluate your chronic cough and shortness of breath.  7. Return to the office in 3 months.

## 2014-11-24 NOTE — Progress Notes (Signed)
cc'ed to pcp °

## 2014-11-26 ENCOUNTER — Encounter: Payer: Self-pay | Admitting: Internal Medicine

## 2014-11-26 ENCOUNTER — Ambulatory Visit (INDEPENDENT_AMBULATORY_CARE_PROVIDER_SITE_OTHER): Payer: Medicare Other | Admitting: Internal Medicine

## 2014-11-26 VITALS — BP 146/88 | HR 85 | Ht <= 58 in | Wt 138.8 lb

## 2014-11-26 DIAGNOSIS — R079 Chest pain, unspecified: Secondary | ICD-10-CM

## 2014-11-26 MED ORDER — SIMVASTATIN 20 MG PO TABS: ORAL_TABLET | ORAL | Status: DC

## 2014-11-26 MED ORDER — LOSARTAN POTASSIUM 50 MG PO TABS: 50.0000 mg | ORAL_TABLET | Freq: Every day | ORAL | Status: DC

## 2014-11-26 NOTE — Progress Notes (Addendum)
HPI [name] [name] is a 74 yo who has a history of CAD Cath in 2010 with 20-3-% RCA; 50 to 60^ ostial LAD;  I saw her 2 years ago  Was seen  Since seen she has  Done OK  Does have some CP that is not associated with activity Not exercising regularly.     Allergies  Allergen Reactions  . Hydrocodone-Acetaminophen     REACTION: urticaria (hives)  . Vicodin [Hydrocodone-Acetaminophen] Itching    Current Outpatient Prescriptions  Medication Sig Dispense Refill  . ALPRAZolam (XANAX) 0.5 MG tablet Take 1 tablet by mouth daily as needed. anxiety    . aspirin 81 MG tablet Take 81 mg by mouth daily.    . Cholecalciferol (VITAMIN D PO) Take 2,000 Int'l Units by mouth daily.    Marland Kitchen docusate sodium (COLACE) 100 MG capsule Take 100 mg by mouth daily.    . DULoxetine (CYMBALTA) 60 MG capsule Take 120 mg by mouth daily.     Marland Kitchen estradiol (ESTRACE) 1 MG tablet Take 0.5 tablets (0.5 mg total) by mouth daily. Take 1/2 tablet po daily 30 tablet 6  . lamoTRIgine (LAMICTAL) 150 MG tablet Take 150 mg by mouth at bedtime.    Marland Kitchen levothyroxine (LEVOTHROID) 25 MCG tablet Take 1 tablet (25 mcg total) by mouth daily. 30 tablet 12  . lidocaine (LIDODERM) 5 % Place 1-3 patches onto the skin as needed. Remove & Discard patch within 12 hours or as directed by MD    . losartan (COZAAR) 50 MG tablet Take 1 tablet (50 mg total) by mouth daily. 90 tablet 3  . meloxicam (MOBIC) 7.5 MG tablet Take 7.5 mg by mouth 2 (two) times daily.     . Multiple Vitamin (MULTIVITAMIN) tablet Take 1 tablet by mouth daily.      . niacin (SLO-NIACIN) 500 MG tablet Take 500 mg by mouth daily.     Marland Kitchen NITROSTAT 0.4 MG SL tablet PLACE 1 TAB UNDER TONGUE EVERY 5 MIN IF NEEDED FOR CHEST PAIN. MAY USE 3 TIMES.NO RELIEF CALL 911. 25 tablet 0  . Omega-3 Fatty Acids (FISH OIL) 1000 MG CAPS Take 1 capsule by mouth daily.     . polyethylene glycol powder (GLYCOLAX/MIRALAX) powder One capful (17grams) once daily for constipation as needed. 527 g 11  . Probiotic  Product (PROBIOTIC DAILY PO) Take 1 tablet by mouth daily. Florify    . RABEprazole (ACIPHEX) 20 MG tablet Take 1 tablet (20 mg total) by mouth 2 (two) times daily before a meal. 60 tablet 5  . RESTASIS 0.05 % ophthalmic emulsion Place 1 drop into both eyes 2 (two) times daily.     . risperiDONE (RISPERDAL) 1 MG tablet Take 0.5 mg by mouth at bedtime.     . simvastatin (ZOCOR) 20 MG tablet TAKE (1) TABLET BY MOUTH AT BEDTIME. 90 tablet 3  . WELLBUTRIN XL 300 MG 24 hr tablet daily.    Marland Kitchen zolpidem (AMBIEN) 10 MG tablet Take 10 mg by mouth at bedtime as needed for sleep.      No current facility-administered medications for this visit.    Past Medical History  Diagnosis Date  . CAD (coronary artery disease)   . Chest pain   . Dizziness   . Dyslipidemia   . GERD (gastroesophageal reflux disease)   . Bipolar affective disorder   . Fibromyalgia   . H/O: hysterectomy   . Hyperlipidemia   . Arthritis   . Chronic fatigue   . Hypertension   .  Hx of adenomatous colonic polyps 2005    Past Surgical History  Procedure Laterality Date  . 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  Remainder of colonic mucosa was normal  . [place] bunionectomy      bilateral  . Esophagogastroduodenoscopy      followed by colonoscopy with snare polypectomy  . Cystectomy      pilonidial cyst  . Breast lumpectomy    . Spinal fusion    . Right knee replacement  02/2013  . Breast biopsy Left 1989  . Shoulder arthroscopy Left 2005  . Bunionectomy Left 2005  . Spinal fusion  2004    L4-L5  . Total hip arthroplasty Right 06/2006  . Total shoulder replacement Left 08/2005  . Total hip arthroplasty  8/12  . Abdominal hysterectomy  1994    TAH- DUB, BSO  . Shoulder arthroscopy Right   . Colonoscopy with esophagogastroduodenoscopy (egd) N/A 02/01/2014    IZT:IWPY erosive reflux/gastric polyp/normal rectum/scattered left sided  diverticula, normal distal TI. gastric bx negative, fundic gland polyp. next TCS 01/2019.    Family History  Problem Relation Age of Onset  . Heart disease Mother   . Heart attack Mother   . Heart disease Father   . Heart attack Father   . Stroke Sister   . Stroke Maternal Grandfather   . Cancer Paternal Grandmother     unknown primary  . Colon cancer Neg Hx     History   Social History  . Marital Status: Widowed    Spouse Name: N/A    Number of Children: N/A  . Years of Education: N/A   Occupational History  . unemployed     retired Education officer, [employer]   Social History Main Topics  . Smoking status: Never Smoker   . Smokeless tobacco: Never Used  . Alcohol Use: 3.5 oz/week    7 drink(s) per week  . Drug Use: No  . Sexual Activity: No     Comment: TAH   Other Topics Concern  . Not on file   Social History Narrative    Review of Systems:  All systems reviewed.  They are negative to the above problem except as previously stated.  Vital Signs: BP 146/88 mmHg  Pulse 85  Ht 4\' 10"  (1.473 m)  Wt 138 lb 12.8 oz (62.959 kg)  BMI 29.02 kg/m2  LMP 02/10/1993  Physical Exam  HEENT:  Normocephalic, atraumatic. EOMI, PERRLA.  Neck: JVP is normal.  No bruits.  Lungs: clear to auscultation. No rales no wheezes.  Heart: Regular rate and rhythm. Normal S1, S2. No S3.   No significant murmurs. PMI not displaced.  Abdomen:  Supple, nontender. Normal bowel sounds. No masses. No hepatomegaly.  Extremities:   Good distal pulses throughout. No lower extremity edema.  Musculoskeletal :moving all extremities.  Neuro:   alert and oriented x3.  CN II-XII grossly intact.  EKG  SR 85 bpm   Assessment and Plan:  1.  CAD  Since seen I do not think she is having any unstable symptoms  She notes some SOB with activiyt but has not been too acitv.e  Encouraged her to increase activity  2l  HL  LIpids are good  LDL 80, HDL 68

## 2014-11-26 NOTE — Patient Instructions (Signed)
**Note De-identified  Obfuscation** Your physician recommends that you continue on your current medications as directed. Please refer to the Current Medication list given to you today.  Your physician wants you to follow-up in: 1 year. You will receive a reminder letter in the mail two months in advance. If you don't receive a letter, please call our office to schedule the follow-up appointment.  

## 2015-02-03 ENCOUNTER — Encounter: Payer: Self-pay | Admitting: Internal Medicine

## 2015-02-04 ENCOUNTER — Other Ambulatory Visit: Payer: Self-pay | Admitting: Gastroenterology

## 2015-03-31 ENCOUNTER — Ambulatory Visit (HOSPITAL_COMMUNITY): Payer: Medicare Other | Attending: Pulmonary Disease | Admitting: Physical Therapy

## 2015-03-31 DIAGNOSIS — M6281 Muscle weakness (generalized): Secondary | ICD-10-CM | POA: Diagnosis not present

## 2015-03-31 DIAGNOSIS — M25561 Pain in right knee: Secondary | ICD-10-CM | POA: Diagnosis present

## 2015-03-31 DIAGNOSIS — M4120 Other idiopathic scoliosis, site unspecified: Secondary | ICD-10-CM

## 2015-03-31 DIAGNOSIS — Z96641 Presence of right artificial hip joint: Secondary | ICD-10-CM | POA: Insufficient documentation

## 2015-03-31 DIAGNOSIS — M25551 Pain in right hip: Secondary | ICD-10-CM | POA: Insufficient documentation

## 2015-03-31 DIAGNOSIS — R262 Difficulty in walking, not elsewhere classified: Secondary | ICD-10-CM | POA: Diagnosis not present

## 2015-03-31 DIAGNOSIS — Z96651 Presence of right artificial knee joint: Secondary | ICD-10-CM | POA: Insufficient documentation

## 2015-03-31 DIAGNOSIS — M5441 Lumbago with sciatica, right side: Secondary | ICD-10-CM | POA: Insufficient documentation

## 2015-03-31 DIAGNOSIS — M25511 Pain in right shoulder: Secondary | ICD-10-CM

## 2015-03-31 NOTE — Therapy (Signed)
[place], [place], [address] Phone: [phone]   Fax:  [phone]  Physical Therapy Evaluation  Patient Details  Name: [name] [name] MRN: [id number] Date of Birth: October 13, 1941 Referring Provider:  Sinda Du, MD  Encounter Date: 03/31/2015      PT End of Session - 03/31/15 2025    Visit Number 1   Number of Visits 16   Date for PT Re-Evaluation 04/30/15   Authorization Type [employer] Medicare   Authorization - Visit Number 1   Authorization - Number of Visits 16   PT Start Time 1310   PT Stop Time 1350   PT Time Calculation (min) 40 min   Activity Tolerance Patient tolerated treatment well   Behavior During Therapy [facility] for tasks assessed/performed      Past Medical History  Diagnosis Date  . CAD (coronary artery disease)   . Chest pain   . Dizziness   . Dyslipidemia   . GERD (gastroesophageal reflux disease)   . Bipolar affective disorder   . Fibromyalgia   . H/O: hysterectomy   . Hyperlipidemia   . Arthritis   . Chronic fatigue   . Hypertension   . Hx of adenomatous colonic polyps 2005    Past Surgical History  Procedure Laterality Date  . Colonoscopy  01/29/2007    WER:[id number] rectum, left-sided diverticula, 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  Remainder of colonic mucosa was normal  . [place] bunionectomy      bilateral  . Esophagogastroduodenoscopy      followed by colonoscopy with snare polypectomy  . Cystectomy      pilonidial cyst  . Breast lumpectomy    . Spinal fusion    . Right knee replacement  02/2013  . Breast biopsy Left 1989  . Shoulder arthroscopy Left 2005  . Bunionectomy Left 2005  . Spinal fusion  2004    L4-L5  . Total hip arthroplasty Right 06/2006  . Total shoulder replacement Left 08/2005  . Total hip arthroplasty  8/12  . Abdominal hysterectomy  1994    TAH- DUB, BSO  . Shoulder arthroscopy Right   . Colonoscopy with  esophagogastroduodenoscopy (egd) N/A 02/01/2014    QPY:PPJK erosive reflux/gastric polyp/normal rectum/scattered left sided diverticula, normal distal TI. gastric bx negative, fundic gland polyp. next TCS 01/2019.    There were no vitals filed for this visit.  Visit Diagnosis:  Knee pain, right  Muscle weakness (generalized)  Hip pain, right  Right-sided low back pain with right-sided sciatica  Right shoulder pain  Idiopathic scoliosis  Difficulty walking      Subjective Assessment - 03/31/15 1311    Pertinent History Rt knee replacment May 2014, and Lt hip and Rt hip rplaced, in addition to Lt shoulder replacment, spinal fusion and history of scoliosis and stenosis. Patient has had 5 oor 6 near falls in last 6 months. Requires UE support for ambulating up and down stairs.    How long can you walk comfortably? with a cane 69minutes, wihtotu very short distances.    Patient Stated Goals To increase LE strength, balance and gait. Patient wants to decrease pain on Rt hip and Rt knee, to improve gait and balance. To decrease back pain and gt back stronger. And In ddition to improve shoulder strength.    Currently in Pain? Yes   Pain Score 8    Pain Location Knee   Pain Orientation Right   Pain Descriptors / Indicators Aching;Shooting  Pain Type Chronic pain   Pain Onset More than a month ago   Pain Frequency Constant   Aggravating Factors  "feels like nerve pain"   Pain Relieving Factors ice, lydocain patches,    Multiple Pain Sites Yes   Pain Score 5   Pain Location Hip   Pain Orientation Right   Pain Descriptors / Indicators Shooting;Aching   Pain Type Chronic pain   Pain Radiating Towards "deep ache"   Pain Onset More than a month ago   Pain Frequency Constant   Pain Score 4   Pain Location Back   Pain Orientation Right   Pain Descriptors / Indicators Aching  broad ache   Pain Type Chronic pain   Pain Onset More than a month ago   Pain Frequency Constant    Aggravating Factors  walking and standing.             [facility] PT Assessment - 03/31/15 0001    Assessment   Medical Diagnosis Rt knee, Rt hip and Rt back pain secondary to surgical history. also bilateral shoulder pain (Rt more painful than Lt)   Onset Date 03/30/01   Next MD Visit Kathyrn Lass   Prior Therapy Yes.    Balance Screen   Has the patient fallen in the past 6 months No  6 near falls.    Has the patient had a decrease in activity level because of a fear of falling?  Yes   Is the patient reluctant to leave their home because of a fear of falling?  Yes   Prior Function   Level of Independence Needs assistance with homemaking   Vocation Retired   Observation/Other Assessments   Focus on Therapeutic Outcomes (FOTO)  Not performed this session to be performed next session.    Functional Tests   Functional tests Sit to Stand;Single leg stance;Other2;Other   Other:   Other/ Comments Gait: limited ability to toe in, excessive trendelenberg gait (Rt more severe than Lt), Excessive toe out on Lt, and pexs planus bilaterally.    Posture/Postural Control   Posture Comments standing: slight forward head, hips even,    ROM / Strength   AROM / PROM / Strength AROM;Strength   AROM   AROM Assessment Site Hip;Knee;Ankle;Lumbar;Shoulder   Right/Left Hip Right;Left   Right Hip External Rotation  38   Right Hip Internal Rotation  45   Left Hip External Rotation  50   Left Hip Internal Rotation  45   Right/Left Knee Right;Left   Right Knee Extension 0   Right Knee Flexion 120   Left Knee Extension 0   Left Knee Flexion 135   Right/Left Ankle Right;Left   Lumbar Flexion 90   Lumbar Extension 26   Lumbar - Right Rotation 79   Lumbar - Left Rotation 75   Strength   Strength Assessment Site Ankle;Hip;Knee;Lumbar   Right/Left Hip Right;Left   Right Hip Flexion 4+/5   Right Hip ABduction 2+/5   Left Hip Flexion 4+/5   Left Hip ABduction 2+/5   Right/Left Knee Right;Left    Right Knee Flexion 4-/5   Right Knee Extension 4+/5   Left Knee Flexion 4-/5   Left Knee Extension 4+/5   Right/Left Ankle Right;Left   Right Ankle Dorsiflexion 4+/5   Left Ankle Dorsiflexion 4+/5   Standardized Balance Assessment   Standardized Balance Assessment Berg Balance Test             PT Education - 03/31/15 2025  Education provided Yes   Education Details Diagosis and prognosis explained   Person(s) Educated Patient   Methods Explanation;Demonstration   Comprehension Verbalized understanding          PT Short Term Goals - 03/31/15 2041    PT SHORT TERM GOAL #1   Title Patient will dmeonstrate 3/5 MMT with hip abduction indicatign improving hip stability and decreased severity in trendelenberg gait.    Time 4   Period Weeks   Status New   PT SHORT TERM GOAL #2   Title Patient will demosntrate increased glut max strength to 4-/5 to be bale to perofrm sit to stand with <2 UE support   Time 4   Period Weeks   Status New   PT SHORT TERM GOAL #3   Title Patient will dmeonstrate increased hamstring strength of 4/5MMT to bebale to go up stairs with only 1 hand rail support and step too pattern.    Time 4   Period Weeks   Status New   PT SHORT TERM GOAL #4   Title Patient willdmeosntrate a negative piriformis test.    Time 4   Period Weeks   Status New   PT SHORT TERM GOAL #5   Title Patient will be independent with HEP.    Period Weeks   Status New           PT Long Term Goals - 03/31/15 2044    PT LONG TERM GOAL #1   Title Patient will demonstrate 4/5 MMT with hip abduction indicatign improving hip stability and decreased severity in trendelenberg gait.    Time 8   Period Weeks   Status New   PT LONG TERM GOAL #2   Title Patient will demosntrate increased glut max strength to 5/5 to be bale to perofrm sit to stand with <2 UE support   Time 8   Period Weeks   Status New   PT LONG TERM GOAL #3   Title Patient will demonstrate increased  hamstring strength of 5/5MMT to be able to go up stairs with no hand rail support and step through pattern.   Time 8   Period Weeks   Status New   PT LONG TERM GOAL #4   Title Patient will be able to ambulate with a cain >43mintues with pain <3/10   Time 8   Period Weeks   Status New   PT LONG TERM GOAL #5   Title I with adcvanced HEP               Plan - 03/31/15 2031    Clinical Impression Statement Patient dispalsy Rt knee pain worse than right back pain worse than right hip pain worse than Rt shoulder pain secondary to a long history of scoliosis and severe osteoarthritis (known per MD) resulting in difficulty walkign with a walker and difficulty performing ADLs. Patient's Rt shoulder pain was not investigated this session tdue to time limitations and patient noting that it was the smallest priority of hers. patient dispalsy severely limited Rt glute med/max weakness (as well as minor Lt glute med weakness) resulting in severe trendelenberg gait , excessive Rt knee valgus and subsequent strain on Rt lumbar spine stabilizers ; additonally noted tightness was found along patient's piriformis muscle resulting possible causng patient's Rt sided shooting pain from Rt buttocks sdown Rt LE. Patient will benefit from skiled phsyical therapy to increase Rt glut med/max strength to improve Rt trun stability and decrease Rt knee, hip and glut  pain and nromalize gait mechaincs so patient can return to ambualting withtou an assistive device.    Pt will benefit from skilled therapeutic intervention in order to improve on the following deficits Abnormal gait;Pain;Decreased strength;Difficulty walking;Decreased mobility;Decreased activity tolerance;Decreased range of motion;Decreased balance;Improper body mechanics;Postural dysfunction;Decreased endurance   Rehab Potential Fair   Clinical Impairments Affecting Rehab Potential long history of pain.    PT Frequency 2x / week   PT Duration 8 weeks   PT  Treatment/Interventions Therapeutic exercise;Balance training;Neuromuscular re-education;Patient/family education;Stair training;Gait training;Manual techniques;Therapeutic activities;Electrical Stimulation;Functional mobility training   PT Next Visit Plan Next session assess patient's Rt shoulder pain. Introduce piriformis stretch, hip flexor stretch, supine knee to chest, side laying clams, side laying hip abduction, and bridges. Also perfom FOTO   PT Home Exercise Plan to be given next session with the above stated exercises.   Consulted and Agree with Plan of Care Patient          G-Codes - 04/30/2015 03/11/46    Functional Assessment Tool Used FOtot to be completed next session, based n pain with standing and walking   Functional Limitation Mobility: Walking and moving around   Mobility: Walking and Moving Around Current Status [phone]) At least 80 percent but less than 100 percent impaired, limited or restricted   Mobility: Walking and Moving Around Goal Status [phone]) At least 60 percent but less than 80 percent impaired, limited or restricted       Problem List Patient Active Problem List   Diagnosis Date Noted  . Chronic cough 11/23/2014  . Fecal incontinence 11/23/2014  . Constipation 09/22/2013  . Hx of adenomatous colonic polyps 09/22/2013  . S/P knee replacement 04/27/2013  . Knee pain 04/27/2013  . Muscle weakness (generalized) 04/27/2013  . Hypertension 10/15/2011  . Status post THR (total hip replacement) 07/26/2011  . Hip pain 07/26/2011  . Difficulty in walking(719.7) 07/26/2011  . DYSLIPIDEMIA 05/11/2009  . BIPOLAR AFFECTIVE DISORDER 05/11/2009  . CAD, UNSPECIFIED SITE 05/11/2009  . GERD 05/11/2009  . FIBROMYALGIA 05/11/2009  . DIZZINESS 05/11/2009  . Chest pain 05/11/2009   Devona Konig PT DPT [place], [place], [address] Phone: [phone]   Fax:  [phone]

## 2015-04-12 ENCOUNTER — Ambulatory Visit (HOSPITAL_COMMUNITY): Payer: Medicare Other | Admitting: Physical Therapy

## 2015-04-12 DIAGNOSIS — M25561 Pain in right knee: Secondary | ICD-10-CM

## 2015-04-12 DIAGNOSIS — M5441 Lumbago with sciatica, right side: Secondary | ICD-10-CM

## 2015-04-12 DIAGNOSIS — M25551 Pain in right hip: Secondary | ICD-10-CM | POA: Diagnosis not present

## 2015-04-12 DIAGNOSIS — M6281 Muscle weakness (generalized): Secondary | ICD-10-CM

## 2015-04-12 DIAGNOSIS — M4120 Other idiopathic scoliosis, site unspecified: Secondary | ICD-10-CM

## 2015-04-12 DIAGNOSIS — R262 Difficulty in walking, not elsewhere classified: Secondary | ICD-10-CM

## 2015-04-12 DIAGNOSIS — M25511 Pain in right shoulder: Secondary | ICD-10-CM

## 2015-04-12 NOTE — Therapy (Signed)
[place], [place], [address] Phone: [phone]   Fax:  [phone]  Physical Therapy Treatment  Patient Details  Name: [name] [name] MRN: [id number] Date of Birth: September 13, 1941 Referring Provider:  Laurance Flatten, MD  Encounter Date: 04/12/2015      PT End of Session - 04/12/15 1451    Visit Number 2   Number of Visits 16   Date for PT Re-Evaluation 04/30/15   Authorization Type [employer] Medicare   Authorization - Visit Number 2   Authorization - Number of Visits 16   PT Start Time 1350   PT Stop Time 1450   PT Time Calculation (min) 60 min   Activity Tolerance Patient tolerated treatment well   Behavior During Therapy [facility] for tasks assessed/performed      Past Medical History  Diagnosis Date  . CAD (coronary artery disease)   . Chest pain   . Dizziness   . Dyslipidemia   . GERD (gastroesophageal reflux disease)   . Bipolar affective disorder   . Fibromyalgia   . H/O: hysterectomy   . Hyperlipidemia   . Arthritis   . Chronic fatigue   . Hypertension   . Hx of adenomatous colonic polyps 2005    Past Surgical History  Procedure Laterality Date  . Colonoscopy  01/29/2007    HQP:RFFMBW rectum, left-sided diverticula, 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  Remainder of colonic mucosa was normal  . [place] bunionectomy      bilateral  . Esophagogastroduodenoscopy      followed by colonoscopy with snare polypectomy  . Cystectomy      pilonidial cyst  . Breast lumpectomy    . Spinal fusion    . Right knee replacement  02/2013  . Breast biopsy Left 1989  . Shoulder arthroscopy Left 2005  . Bunionectomy Left 2005  . Spinal fusion  2004    L4-L5  . Total hip arthroplasty Right 06/2006  . Total shoulder replacement Left 08/2005  . Total hip arthroplasty  8/12  . Abdominal hysterectomy  1994    TAH- DUB, BSO  . Shoulder arthroscopy Right   . Colonoscopy with  esophagogastroduodenoscopy (egd) N/A 02/01/2014    GYK:ZLDJ erosive reflux/gastric polyp/normal rectum/scattered left sided diverticula, normal distal TI. gastric bx negative, fundic gland polyp. next TCS 01/2019.    There were no vitals filed for this visit.  Visit Diagnosis:  Knee pain, right  Muscle weakness (generalized)  Hip pain, right  Right-sided low back pain with right-sided sciatica  Right shoulder pain  Idiopathic scoliosis  Difficulty walking      Subjective Assessment - 04/12/15 1408    Subjective Pt states her pain is 5/10 in her Rt LE, hip and back.  PT states she has not been doing any of her exericses from previous therapy.  States her Rt LE is weak and has to climb 20 steps everyday.   Currently in Pain? Yes   Pain Score 5    Pain Location Knee   Pain Orientation Right            [facility] PT Assessment - 04/12/15 1451    Observation/Other Assessments   Focus on Therapeutic Outcomes (FOTO)  66% limited                     [facility] Adult PT Treatment/Exercise - 04/12/15 1410    Knee/Hip Exercises: Stretches   Active Hamstring Stretch 2 reps;30 seconds  Active Hamstring Stretch Limitations 7" step   Hip Flexor Stretch 2 reps;30 seconds   Hip Flexor Stretch Limitations 7" step   Knee/Hip Exercises: Aerobic   Stationary Bike nustep 8 minutes hills #2 level 3 UE (7), LE (5)   Knee/Hip Exercises: Supine   Bridges 10 reps   Straight Leg Raises Both;10 reps   Knee/Hip Exercises: Sidelying   Hip ABduction Left;10 reps   Hip ABduction Limitations unable to complete on Rt LE due to weakness and pain   Clams 10 X 10" each                PT Education - 04/12/15 1451    Education provided Yes   Education Details HEP given and explained/completed   Person(s) Educated Patient   Methods Explanation;Demonstration;Handout   Comprehension Verbalized understanding;Returned demonstration;Need further instruction          PT Short Term  Goals - 03/31/15 2041    PT SHORT TERM GOAL #1   Title Patient will dmeonstrate 3/5 MMT with hip abduction indicatign improving hip stability and decreased severity in trendelenberg gait.    Time 4   Period Weeks   Status New   PT SHORT TERM GOAL #2   Title Patient will demosntrate increased glut max strength to 4-/5 to be bale to perofrm sit to stand with <2 UE support   Time 4   Period Weeks   Status New   PT SHORT TERM GOAL #3   Title Patient will dmeonstrate increased hamstring strength of 4/5MMT to bebale to go up stairs with only 1 hand rail support and step too pattern.    Time 4   Period Weeks   Status New   PT SHORT TERM GOAL #4   Title Patient willdmeosntrate a negative piriformis test.    Time 4   Period Weeks   Status New   PT SHORT TERM GOAL #5   Title Patient will be independent with HEP.    Period Weeks   Status New           PT Long Term Goals - 03/31/15 2044    PT LONG TERM GOAL #1   Title Patient will demonstrate 4/5 MMT with hip abduction indicatign improving hip stability and decreased severity in trendelenberg gait.    Time 8   Period Weeks   Status New   PT LONG TERM GOAL #2   Title Patient will demosntrate increased glut max strength to 5/5 to be bale to perofrm sit to stand with <2 UE support   Time 8   Period Weeks   Status New   PT LONG TERM GOAL #3   Title Patient will demonstrate increased hamstring strength of 5/5MMT to be able to go up stairs with no hand rail support and step through pattern.   Time 8   Period Weeks   Status New   PT LONG TERM GOAL #4   Title Patient will be able to ambulate with a cain >15mintues with pain <3/10   Time 8   Period Weeks   Status New   PT LONG TERM GOAL #5   Title I with adcvanced HEP               Plan - 04/12/15 1452    Clinical Impression Statement FOTO completed with 66% limitation.  HEP established and written copy given to patient.  PT with diffiuclty completing all exericses due to  knee/hip/back pain and weakness.  Exericses modified for  pateint to be able to complete independently.  Unable to complete hip abduction with Rt LE at this time and with limited ROM completing SLR and Lt hip abduction due to weakness.  Pt encouarged to complete HEP and to begin aerobic actvity at home.  Pt most concerned with balance and Rt knee pain at this time.     Clinical Impairments Affecting Rehab Potential long history of pain.    PT Next Visit Plan Next session with therapist, assess patient's Rt shoulder pain. Progress exericises as able with main focus obn Rt glute med/max (Lt is also weak).  Progress core strengthening exercises next visit.  Review HEP.    PT Home Exercise Plan given for hip flexor/hamstring stretches.  supine bridge, KTC and SLR.  sidelying clams   Consulted and Agree with Plan of Care Patient        Problem List Patient Active Problem List   Diagnosis Date Noted  . Chronic cough 11/23/2014  . Fecal incontinence 11/23/2014  . Constipation 09/22/2013  . Hx of adenomatous colonic polyps 09/22/2013  . S/P knee replacement 04/27/2013  . Knee pain 04/27/2013  . Muscle weakness (generalized) 04/27/2013  . Hypertension 10/15/2011  . Status post THR (total hip replacement) 07/26/2011  . Hip pain 07/26/2011  . Difficulty in walking(719.7) 07/26/2011  . DYSLIPIDEMIA 05/11/2009  . BIPOLAR AFFECTIVE DISORDER 05/11/2009  . CAD, UNSPECIFIED SITE 05/11/2009  . GERD 05/11/2009  . FIBROMYALGIA 05/11/2009  . DIZZINESS 05/11/2009  . Chest pain 05/11/2009   Teena Irani, PTA/CLT [phone]  04/12/2015, 3:00 PM  [place], [place], [address] Phone: [phone]   Fax:  [phone]

## 2015-04-12 NOTE — Patient Instructions (Addendum)
Knee-to-Chest Stretch: Unilateral   With hand behind right knee, pull knee in to chest until a comfortable stretch is felt in lower back and buttocks. Keep back relaxed. Hold __10__ seconds. Repeat _5___ times per set. Do _2___ sessions per day.    Clam Shell 45 Degrees   Lying with hips and knees bent 45.  Lift knee. Be sure pelvis does not roll backward. Do not arch back. Do __10_ times, each leg, _2__ times per day.  Hector [place] Pose   Press small of back into mat, maintain pelvic tilt, roll up one vertebrae at a time. Focus on engaging posterior hip muscles. Repeat _10___ times.  .  Straight Leg Raise   Tighten stomach and slowly raise locked right leg __10_ inches from floor. Repeat __10__ times per set.  Do __2_ sessions per day.

## 2015-04-14 ENCOUNTER — Ambulatory Visit (HOSPITAL_COMMUNITY): Payer: Medicare Other | Attending: Orthopedic Surgery | Admitting: Physical Therapy

## 2015-04-14 DIAGNOSIS — Z96651 Presence of right artificial knee joint: Secondary | ICD-10-CM | POA: Diagnosis not present

## 2015-04-14 DIAGNOSIS — Z96641 Presence of right artificial hip joint: Secondary | ICD-10-CM | POA: Diagnosis not present

## 2015-04-14 DIAGNOSIS — M6281 Muscle weakness (generalized): Secondary | ICD-10-CM | POA: Insufficient documentation

## 2015-04-14 DIAGNOSIS — M25511 Pain in right shoulder: Secondary | ICD-10-CM | POA: Diagnosis not present

## 2015-04-14 DIAGNOSIS — R262 Difficulty in walking, not elsewhere classified: Secondary | ICD-10-CM | POA: Diagnosis not present

## 2015-04-14 DIAGNOSIS — M5441 Lumbago with sciatica, right side: Secondary | ICD-10-CM | POA: Insufficient documentation

## 2015-04-14 DIAGNOSIS — M25561 Pain in right knee: Secondary | ICD-10-CM | POA: Diagnosis present

## 2015-04-14 DIAGNOSIS — M25551 Pain in right hip: Secondary | ICD-10-CM | POA: Insufficient documentation

## 2015-04-14 DIAGNOSIS — M4120 Other idiopathic scoliosis, site unspecified: Secondary | ICD-10-CM

## 2015-04-14 NOTE — Therapy (Signed)
[place], [place], [address] Phone: [phone]   Fax:  [phone]  Physical Therapy Treatment  Patient Details  Name: [name] [name] MRN: [id number] Date of Birth: December 11, 1940 Referring Provider:  Laurance Flatten, MD  Encounter Date: 04/14/2015      PT End of Session - 04/14/15 1604    Visit Number 3   Number of Visits 16   Date for PT Re-Evaluation 04/30/15   Authorization Type [employer] Medicare   Authorization - Visit Number 3   Authorization - Number of Visits 16   PT Start Time 1435   PT Stop Time 1555   PT Time Calculation (min) 80 min   Activity Tolerance Patient tolerated treatment well   Behavior During Therapy [facility] for tasks assessed/performed      Past Medical History  Diagnosis Date  . CAD (coronary artery disease)   . Chest pain   . Dizziness   . Dyslipidemia   . GERD (gastroesophageal reflux disease)   . Bipolar affective disorder   . Fibromyalgia   . H/O: hysterectomy   . Hyperlipidemia   . Arthritis   . Chronic fatigue   . Hypertension   . Hx of adenomatous colonic polyps 2005    Past Surgical History  Procedure Laterality Date  . Colonoscopy  01/29/2007    SWF:UXNATF rectum, left-sided diverticula, 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  Remainder of colonic mucosa was normal  . [place] bunionectomy      bilateral  . Esophagogastroduodenoscopy      followed by colonoscopy with snare polypectomy  . Cystectomy      pilonidial cyst  . Breast lumpectomy    . Spinal fusion    . Right knee replacement  02/2013  . Breast biopsy Left 1989  . Shoulder arthroscopy Left 2005  . Bunionectomy Left 2005  . Spinal fusion  2004    L4-L5  . Total hip arthroplasty Right 06/2006  . Total shoulder replacement Left 08/2005  . Total hip arthroplasty  8/12  . Abdominal hysterectomy  1994    TAH- DUB, BSO  . Shoulder arthroscopy Right   . Colonoscopy with  esophagogastroduodenoscopy (egd) N/A 02/01/2014    TDD:UKGU erosive reflux/gastric polyp/normal rectum/scattered left sided diverticula, normal distal TI. gastric bx negative, fundic gland polyp. next TCS 01/2019.    There were no vitals filed for this visit.  Visit Diagnosis:  Knee pain, right  Muscle weakness (generalized)  Hip pain, right  Right-sided low back pain with right-sided sciatica  Idiopathic scoliosis  Difficulty walking  Right shoulder pain      Subjective Assessment - 04/14/15 1436    Subjective Pt states her pain is about the same, 5/10 knees hip  and back.  Reports complaince with HEP.l                         [facility] Adult PT Treatment/Exercise - 04/14/15 1437    Knee/Hip Exercises: Stretches   Active Hamstring Stretch 2 reps;30 seconds   Active Hamstring Stretch Limitations 7" step   Hip Flexor Stretch 2 reps;30 seconds   Hip Flexor Stretch Limitations 7" step   Knee/Hip Exercises: Aerobic   Stationary Bike nustep 8 minutes hills #2 level 3 UE (7), LE (5)   Knee/Hip Exercises: Standing   Other Standing Knee Exercises single leg reach A/P on 2" step bilaterally 5X   Knee/Hip Exercises: Seated   Other Seated Knee Exercises  thoracic 3D excursions arms crossed 10 reps each   Knee/Hip Exercises: Supine   Bridges 10 reps;2 sets   Straight Leg Raises Both;10 reps;2 sets   Other Supine Knee Exercises abdominal ball exericses 10 reps neutral, 10 reps Lt LE/Rt UE, wo reps Rt LE/Lt UE   Knee/Hip Exercises: Sidelying   Hip ABduction Left;10 reps;2 sets   Hip ABduction Limitations AAROM with Rt LE   Clams 10 X 2 sets each   Modalities   Modalities Ultrasound   Ultrasound   Ultrasound Location Rt anterior knee and lateral hamstring   Ultrasound Parameters 1.5 w/cm2 continuous with 3 Mhz 8 minutes total   Ultrasound Goals Pain                  PT Short Term Goals - 03/31/15 2041    PT SHORT TERM GOAL #1   Title Patient will  dmeonstrate 3/5 MMT with hip abduction indicatign improving hip stability and decreased severity in trendelenberg gait.    Time 4   Period Weeks   Status New   PT SHORT TERM GOAL #2   Title Patient will demosntrate increased glut max strength to 4-/5 to be bale to perofrm sit to stand with <2 UE support   Time 4   Period Weeks   Status New   PT SHORT TERM GOAL #3   Title Patient will dmeonstrate increased hamstring strength of 4/5MMT to bebale to go up stairs with only 1 hand rail support and step too pattern.    Time 4   Period Weeks   Status New   PT SHORT TERM GOAL #4   Title Patient willdmeosntrate a negative piriformis test.    Time 4   Period Weeks   Status New   PT SHORT TERM GOAL #5   Title Patient will be independent with HEP.    Period Weeks   Status New           PT Long Term Goals - 03/31/15 2044    PT LONG TERM GOAL #1   Title Patient will demonstrate 4/5 MMT with hip abduction indicatign improving hip stability and decreased severity in trendelenberg gait.    Time 8   Period Weeks   Status New   PT LONG TERM GOAL #2   Title Patient will demosntrate increased glut max strength to 5/5 to be bale to perofrm sit to stand with <2 UE support   Time 8   Period Weeks   Status New   PT LONG TERM GOAL #3   Title Patient will demonstrate increased hamstring strength of 5/5MMT to be able to go up stairs with no hand rail support and step through pattern.   Time 8   Period Weeks   Status New   PT LONG TERM GOAL #4   Title Patient will be able to ambulate with a cain >69mintues with pain <3/10   Time 8   Period Weeks   Status New   PT LONG TERM GOAL #5   Title I with adcvanced HEP               Plan - 04/14/15 1605    Clinical Impression Statement Pt without questions this session other than wanting to try Ultrasound.  Reports MD had ordered this (phonophoresis).  PT able to complete sidelying hip abduction on Rt LE with therapist assistance but without  pain.  Added single leg reach actvitiy to increase glute strength and abdominal actvities with physioball.  Noted weakness of Rt hip as compared to LT.  [place] completed for Rt knee with verbalized improvment in pain at EOS.  Completed nustep at EOS.   Clinical Impairments Affecting Rehab Potential long history of pain.    PT Next Visit Plan Next session with therapist, assess patient's Rt shoulder pain. Progress exericises as able with main focus obn Rt glute med/max (Lt is also weak).  Progress core strengthening exercises next visit.  Review HEP.    PT Home Exercise Plan given for hip flexor/hamstring stretches.  supine bridge, KTC and SLR.  sidelying clams   Consulted and Agree with Plan of Care Patient        Problem List Patient Active Problem List   Diagnosis Date Noted  . Chronic cough 11/23/2014  . Fecal incontinence 11/23/2014  . Constipation 09/22/2013  . Hx of adenomatous colonic polyps 09/22/2013  . S/P knee replacement 04/27/2013  . Knee pain 04/27/2013  . Muscle weakness (generalized) 04/27/2013  . Hypertension 10/15/2011  . Status post THR (total hip replacement) 07/26/2011  . Hip pain 07/26/2011  . Difficulty in walking(719.7) 07/26/2011  . DYSLIPIDEMIA 05/11/2009  . BIPOLAR AFFECTIVE DISORDER 05/11/2009  . CAD, UNSPECIFIED SITE 05/11/2009  . GERD 05/11/2009  . FIBROMYALGIA 05/11/2009  . DIZZINESS 05/11/2009  . Chest pain 05/11/2009   Teena Irani, PTA/CLT [phone]  04/14/2015, 4:43 PM  [place] [address] [place], [place], [address] Phone: [phone]   Fax:  [phone]

## 2015-04-20 ENCOUNTER — Ambulatory Visit (HOSPITAL_COMMUNITY): Payer: Medicare Other | Admitting: Physical Therapy

## 2015-04-20 DIAGNOSIS — M25551 Pain in right hip: Secondary | ICD-10-CM | POA: Diagnosis not present

## 2015-04-20 DIAGNOSIS — M25561 Pain in right knee: Secondary | ICD-10-CM

## 2015-04-20 DIAGNOSIS — R262 Difficulty in walking, not elsewhere classified: Secondary | ICD-10-CM

## 2015-04-20 DIAGNOSIS — M5441 Lumbago with sciatica, right side: Secondary | ICD-10-CM

## 2015-04-20 DIAGNOSIS — M6281 Muscle weakness (generalized): Secondary | ICD-10-CM

## 2015-04-20 DIAGNOSIS — M4120 Other idiopathic scoliosis, site unspecified: Secondary | ICD-10-CM

## 2015-04-20 DIAGNOSIS — M25511 Pain in right shoulder: Secondary | ICD-10-CM

## 2015-04-20 NOTE — Patient Instructions (Signed)
   SCAPULAR RETRACTIONS  Draw your shoulder blades back and down. Perform this 10 times, take a break, then perform 10 more repetitions. Do this 1-2 times per day.     SHOULDER ROLLS  Move your shoulders in a circular pattern as shown so that your are moving in an up, back and down direction. Perform small cicles if needed for comfort. Perform 10 times, take a break, then perform 10 more repetitions.     CERVICAL ROTATION  Turn your head towards the side, then return back to looking straight ahead. Perform 10 times, take a break, then perform 10 more times.

## 2015-04-20 NOTE — Therapy (Signed)
[place], [place], [address] Phone: [phone]   Fax:  [phone]  Physical Therapy Treatment  Patient Details  Name: [name] [name] MRN: [id number] Date of Birth: 07-May-1941 Referring Provider:  Sinda Du, MD  Encounter Date: 04/20/2015      PT End of Session - 04/20/15 1600    Visit Number 4   Number of Visits 16   Date for PT Re-Evaluation 04/30/15   Authorization Type [employer] Medicare   Authorization - Visit Number 4   Authorization - Number of Visits 16   PT Start Time 8250   PT Stop Time 1555   PT Time Calculation (min) 82 min   Activity Tolerance Patient tolerated treatment well   Behavior During Therapy [facility] for tasks assessed/performed      Past Medical History  Diagnosis Date  . CAD (coronary artery disease)   . Chest pain   . Dizziness   . Dyslipidemia   . GERD (gastroesophageal reflux disease)   . Bipolar affective disorder   . Fibromyalgia   . H/O: hysterectomy   . Hyperlipidemia   . Arthritis   . Chronic fatigue   . Hypertension   . Hx of adenomatous colonic polyps 2005    Past Surgical History  Procedure Laterality Date  . Colonoscopy  01/29/2007    NLZ:[id number] rectum, left-sided diverticula, 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  Remainder of colonic mucosa was normal  . [place] bunionectomy      bilateral  . Esophagogastroduodenoscopy      followed by colonoscopy with snare polypectomy  . Cystectomy      pilonidial cyst  . Breast lumpectomy    . Spinal fusion    . Right knee replacement  02/2013  . Breast biopsy Left 1989  . Shoulder arthroscopy Left 2005  . Bunionectomy Left 2005  . Spinal fusion  2004    L4-L5  . Total hip arthroplasty Right 06/2006  . Total shoulder replacement Left 08/2005  . Total hip arthroplasty  8/12  . Abdominal hysterectomy  1994    TAH- DUB, BSO  . Shoulder arthroscopy Right   . Colonoscopy with  esophagogastroduodenoscopy (egd) N/A 02/01/2014    PFX:TKWI erosive reflux/gastric polyp/normal rectum/scattered left sided diverticula, normal distal TI. gastric bx negative, fundic gland polyp. next TCS 01/2019.    There were no vitals filed for this visit.  Visit Diagnosis:  Knee pain, right  Muscle weakness (generalized)  Hip pain, right  Right-sided low back pain with right-sided sciatica  Idiopathic scoliosis  Difficulty walking  Right shoulder pain      Subjective Assessment - 04/20/15 1457    Subjective Patient reports she is doing well today, her R shoulder is her primary source of pain today.    Pertinent History Rt knee replacment May 2014, and Lt hip and Rt hip rplaced, in addition to Lt shoulder replacment, spinal fusion and history of scoliosis and stenosis. Patient has had 5 oor 6 near falls in last 6 months. Requires UE support for ambulating up and down stairs.    Patient Stated Goals To increase LE strength, balance and gait. Patient wants to decrease pain on Rt hip and Rt knee, to improve gait and balance. To decrease back pain and gt back stronger. And In ddition to improve shoulder strength.    Currently in Pain? Yes   Pain Score 5    Pain Location --  R knee and R leg in general;  R shoulder stiffness that mostly flares up when she tries to move it   Pain Orientation Right            [facility] PT Assessment - 04/20/15 0001    AROM   Overall AROM Comments reduced scapular mobility noted today with flexion and ABD.    Right Shoulder Flexion 130 Degrees   Right Shoulder ABduction 118 Degrees   Right Shoulder Internal Rotation 60 Degrees  before compensation    Right Shoulder External Rotation 18 Degrees  pain limited    Left Shoulder Flexion 148 Degrees   Left Shoulder ABduction 145 Degrees   Left Shoulder Internal Rotation --  WNL    Left Shoulder External Rotation 70 Degrees   Cervical Flexion 55   Cervical Extension 32   Cervical - Right Side Bend  50   Cervical - Left Side Bend 47   Cervical - Right Rotation 85   Cervical - Left Rotation 63  pain limited    Strength   Right Shoulder Flexion 4-/5   Right Shoulder ABduction 4-/5   Right Shoulder Internal Rotation 4-/5   Right Shoulder External Rotation 4-/5   Left Shoulder Flexion 4/5   Left Shoulder ABduction 4/5   Left Shoulder Internal Rotation 4/5   Left Shoulder External Rotation 4/5                     [facility] Adult PT Treatment/Exercise - 04/20/15 0001    Knee/Hip Exercises: Stretches   Active Hamstring Stretch 3 reps;30 seconds   Active Hamstring Stretch Limitations 12 inch box    Hip Flexor Stretch 2 reps;30 seconds   Hip Flexor Stretch Limitations 7" step   Gastroc Stretch 3 reps;30 seconds   Gastroc Stretch Limitations slantboard    Knee/Hip Exercises: Standing   Other Standing Knee Exercises 3D hip excursions in standing within very limited ROM in order to mobilize hips/low back within safety of hip precautions  1x15   Knee/Hip Exercises: Seated   Other Seated Knee Exercises thoracic and cervical 3D excursions arms crossed 15 reps each   Other Seated Knee Exercises Sit to stand 1x10 with 3 second eccentric lower    Knee/Hip Exercises: Supine   Bridges 10 reps;2 sets   Bridges Limitations standard form    Straight Leg Raises Both;10 reps;2 sets   Other Supine Knee Exercises abdominal squeezes with visual cues of traffic cone on stomach 2x20   Other Supine Knee Exercises Supine hip ABD 2x10   Knee/Hip Exercises: Sidelying   Clams 1x15, 1 set each                 PT Education - 04/20/15 1600    Education provided Yes   Education Details HEP for shoulder   Person(s) Educated Patient   Methods Explanation   Comprehension Verbalized understanding          PT Short Term Goals - 04/20/15 1604    PT SHORT TERM GOAL #1   Title Patient will dmeonstrate 3/5 MMT with hip abduction indicatign improving hip stability and decreased severity in  trendelenberg gait.    Time 4   Period Weeks   Status New   PT SHORT TERM GOAL #2   Title Patient will demosntrate increased glut max strength to 4-/5 to be bale to perofrm sit to stand with <2 UE support   Time 4   Period Weeks   Status New   PT SHORT TERM GOAL #3   Title  Patient will dmeonstrate increased hamstring strength of 4/5MMT to bebale to go up stairs with only 1 hand rail support and step too pattern.    Time 4   Period Weeks   Status New   PT SHORT TERM GOAL #4   Title Patient willdmeosntrate a negative piriformis test.    Time 4   Period Weeks   Status New   PT SHORT TERM GOAL #5   Title Patient will be independent with HEP.    Period Weeks   Status New   Additional Short Term Goals   Additional Short Term Goals Yes   PT SHORT TERM GOAL #6   Title Patient will demonstrate an improvement of at least 10 degrees in all planes R shoulder with no increased pain    Time 4   Period Weeks   Status New           PT Long Term Goals - 04/20/15 1604    PT LONG TERM GOAL #1   Title Patient will demonstrate 4/5 MMT with hip abduction indicatign improving hip stability and decreased severity in trendelenberg gait.    Time 8   Period Weeks   Status New   PT LONG TERM GOAL #2   Title Patient will demosntrate increased glut max strength to 5/5 to be bale to perofrm sit to stand with <2 UE support   Time 8   Period Weeks   Status New   PT LONG TERM GOAL #3   Title Patient will demonstrate increased hamstring strength of 5/5MMT to be able to go up stairs with no hand rail support and step through pattern.   Time 8   Period Weeks   Status New   PT LONG TERM GOAL #4   Title Patient will be able to ambulate with a cain >37mintues with pain <3/10   Time 8   Period Weeks   Status New   PT LONG TERM GOAL #5   Title I with adcvanced HEP   Additional Long Term Goals   Additional Long Term Goals Yes   PT LONG TERM GOAL #6   Title Patient will demonstrate improved range  by at least 15 degrees R shoulder, R shoulder strength at least 4/5, R shoulder pain no more than 2/10 on average    Time 8   Period Weeks   Status New               Plan - 04/20/15 1600    Clinical Impression Statement Continued functional exercises and stretches today within pain limits. Assessed R shoulder and found generalized weakness and significant reductions in ROM in this limb, however patient states that she is planning on potentially getting shoulder replaced at some poiint. Assigned gentle HEP to address shoulder, reccommend gentle mobilization and exercise during sessions within pain limits but focus on knee/hip pain.    Pt will benefit from skilled therapeutic intervention in order to improve on the following deficits Abnormal gait;Pain;Decreased strength;Difficulty walking;Decreased mobility;Decreased activity tolerance;Decreased range of motion;Decreased balance;Improper body mechanics;Postural dysfunction;Decreased endurance   Rehab Potential Fair   Clinical Impairments Affecting Rehab Potential long history of pain.    PT Frequency 2x / week   PT Duration 8 weeks   PT Treatment/Interventions Therapeutic exercise;Balance training;Neuromuscular re-education;Patient/family education;Stair training;Gait training;Manual techniques;Therapeutic activities;Electrical Stimulation;Functional mobility training   PT Next Visit Plan [place], functional exercises and activities with focus on R glut med/max, core strength    PT Home Exercise Plan given for hip flexor/hamstring  stretches.  supine bridge, KTC and SLR.  sidelying clams   Consulted and Agree with Plan of Care Patient        Problem List Patient Active Problem List   Diagnosis Date Noted  . Chronic cough 11/23/2014  . Fecal incontinence 11/23/2014  . Constipation 09/22/2013  . Hx of adenomatous colonic polyps 09/22/2013  . S/P knee replacement 04/27/2013  . Knee pain 04/27/2013  . Muscle weakness (generalized)  04/27/2013  . Hypertension 10/15/2011  . Status post THR (total hip replacement) 07/26/2011  . Hip pain 07/26/2011  . Difficulty in walking(719.7) 07/26/2011  . DYSLIPIDEMIA 05/11/2009  . BIPOLAR AFFECTIVE DISORDER 05/11/2009  . CAD, UNSPECIFIED SITE 05/11/2009  . GERD 05/11/2009  . FIBROMYALGIA 05/11/2009  . DIZZINESS 05/11/2009  . Chest pain 05/11/2009    Deniece Ree PT, DPT [facility] [address] [place], [place], [address] Phone: [phone]   Fax:  [phone]

## 2015-04-22 ENCOUNTER — Ambulatory Visit (HOSPITAL_COMMUNITY): Payer: Medicare Other

## 2015-04-22 DIAGNOSIS — M25561 Pain in right knee: Secondary | ICD-10-CM

## 2015-04-22 DIAGNOSIS — M25551 Pain in right hip: Secondary | ICD-10-CM

## 2015-04-22 DIAGNOSIS — M4120 Other idiopathic scoliosis, site unspecified: Secondary | ICD-10-CM

## 2015-04-22 DIAGNOSIS — M6281 Muscle weakness (generalized): Secondary | ICD-10-CM

## 2015-04-22 DIAGNOSIS — R262 Difficulty in walking, not elsewhere classified: Secondary | ICD-10-CM

## 2015-04-22 DIAGNOSIS — M5441 Lumbago with sciatica, right side: Secondary | ICD-10-CM

## 2015-04-22 DIAGNOSIS — M25511 Pain in right shoulder: Secondary | ICD-10-CM

## 2015-04-22 NOTE — Therapy (Signed)
[place], [place], [address] Phone: [phone]   Fax:  [phone]  Physical Therapy Treatment  Patient Details  Name: [name] [name] MRN: [id number] Date of Birth: [birth year] Referring Provider:  Laurance Flatten, MD  Encounter Date: 04/22/2015      PT End of Session - 04/22/15 1540    Visit Number 5   Number of Visits 16   Date for PT Re-Evaluation 04/30/15   Authorization Type [employer] Medicare   Authorization - Visit Number 5   Authorization - Number of Visits 16   PT Start Time 1526   PT Stop Time 1610   PT Time Calculation (min) 44 min   Activity Tolerance Patient tolerated treatment well   Behavior During Therapy [facility] for tasks assessed/performed      Past Medical History  Diagnosis Date  . CAD (coronary artery disease)   . Chest pain   . Dizziness   . Dyslipidemia   . GERD (gastroesophageal reflux disease)   . Bipolar affective disorder   . Fibromyalgia   . H/O: hysterectomy   . Hyperlipidemia   . Arthritis   . Chronic fatigue   . Hypertension   . Hx of adenomatous colonic polyps 2005    Past Surgical History  Procedure Laterality Date  . 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  Remainder of colonic mucosa was normal  . [place] bunionectomy      bilateral  . Esophagogastroduodenoscopy      followed by colonoscopy with snare polypectomy  . Cystectomy      pilonidial cyst  . Breast lumpectomy    . Spinal fusion    . Right knee replacement  02/2013  . Breast biopsy Left 1989  . Shoulder arthroscopy Left 2005  . Bunionectomy Left 2005  . Spinal fusion  2004    L4-L5  . Total hip arthroplasty Right 06/2006  . Total shoulder replacement Left 08/2005  . Total hip arthroplasty  8/12  . Abdominal hysterectomy  1994    TAH- DUB, BSO  . Shoulder arthroscopy Right   . Colonoscopy with  esophagogastroduodenoscopy (egd) N/A 02/01/2014    NTZ:GYFV erosive reflux/gastric polyp/normal rectum/scattered left sided diverticula, normal distal TI. gastric bx negative, fundic gland polyp. next TCS 01/2019.    There were no vitals filed for this visit.  Visit Diagnosis:  Knee pain, right  Muscle weakness (generalized)  Hip pain, right  Right-sided low back pain with right-sided sciatica  Idiopathic scoliosis  Difficulty walking  Right shoulder pain      Subjective Assessment - 04/22/15 1526    Subjective Pt 10 minutes late for apt today.  Pt stated Rt knee pain scale 5/10, Rt shoulder is feeling good today.  Pt reported sciatic pain symptoms increase to ankle during hamstring stretches   Currently in Pain? Yes   Pain Score 5    Pain Location Knee   Pain Orientation Right   Pain Descriptors / Indicators Sharp;Burning            [facility] PT Assessment - 04/22/15 0001    Assessment   Medical Diagnosis Rt knee, Rt hip and Rt back pain secondary to surgical history. also bilateral shoulder pain (Rt more painful than Lt)   Onset Date/Surgical Date 03/30/01   Next MD Visit Kathyrn Lass unscheduled per PRN   Prior Therapy Yes.  [place] Adult PT Treatment/Exercise - 04/22/15 0001    Knee/Hip Exercises: Stretches   Active Hamstring Stretch 3 reps;30 seconds   Active Hamstring Stretch Limitations 12 inch box    Hip Flexor Stretch 2 reps;30 seconds   Hip Flexor Stretch Limitations 7" step   Gastroc Stretch 3 reps;30 seconds   Gastroc Stretch Limitations slantboard    Knee/Hip Exercises: Aerobic   Stationary Bike nustep 8 minutes hills #3 level 3 UE (7), LE (5)   Knee/Hip Exercises: Standing   Other Standing Knee Exercises 3D hip excursions in standing within very limited ROM in order to mobilize hips/low back within safety of hip precautions  1x15   Other Standing Knee Exercises sidestepping   Modalities   Modalities Ultrasound    Ultrasound   Ultrasound Location Rt anterior lateral, medial and medial hamstring region   Ultrasound Parameters 1.5 w/cm2 3 MHz continuous x 8 minutes   Ultrasound Goals Pain                  PT Short Term Goals - 04/22/15 1541    PT SHORT TERM GOAL #1   Title Patient will dmeonstrate 3/5 MMT with hip abduction indicatign improving hip stability and decreased severity in trendelenberg gait.    Status On-going   PT SHORT TERM GOAL #2   Title Patient will demosntrate increased glut max strength to 4-/5 to be bale to perofrm sit to stand with <2 UE support   Status On-going   PT SHORT TERM GOAL #3   Title Patient will dmeonstrate increased hamstring strength of 4/5MMT to bebale to go up stairs with only 1 hand rail support and step too pattern.    Status On-going   PT SHORT TERM GOAL #4   Title Patient will dmeosntrate a negative piriformis test.    Status On-going   PT SHORT TERM GOAL #5   Title Patient will be independent with HEP.    Status On-going   PT SHORT TERM GOAL #6   Title Patient will demonstrate an improvement of at least 10 degrees in all planes R shoulder with no increased pain            PT Long Term Goals - 04/22/15 1546    PT LONG TERM GOAL #1   Title Patient will demonstrate 4/5 MMT with hip abduction indicatign improving hip stability and decreased severity in trendelenberg gait.    PT LONG TERM GOAL #2   Title Patient will demosntrate increased glut max strength to 5/5 to be bale to perofrm sit to stand with <2 UE support   PT LONG TERM GOAL #3   Title Patient will demonstrate increased hamstring strength of 5/5MMT to be able to go up stairs with no hand rail support and step through pattern.   PT LONG TERM GOAL #4   Title Patient will be able to ambulate with a cain >39mintues with pain <3/10   PT LONG TERM GOAL #5   Title I with adcvanced HEP   PT LONG TERM GOAL #6   Title Patient will demonstrate improved range by at least 15 degrees R  shoulder, R shoulder strength at least 4/5, R shoulder pain no more than 2/10 on average                Plan - 04/22/15 1608    Clinical Impression Statement Session focus on improving flexibilty to reduce tightness and improving proximal musculature strengthening.  Added sidestepping for glut med strengthening to improve  stability with standing and gait.  Ended session with [place] to medial, lateral and medial hamstring region to reduce pain with noted improved gait mechanics.   PT Next Visit Plan [place], functional exercises and activities with focus on R glut med/max, core strength         Problem List Patient Active Problem List   Diagnosis Date Noted  . Chronic cough 11/23/2014  . Fecal incontinence 11/23/2014  . Constipation 09/22/2013  . Hx of adenomatous colonic polyps 09/22/2013  . S/P knee replacement 04/27/2013  . Knee pain 04/27/2013  . Muscle weakness (generalized) 04/27/2013  . Hypertension 10/15/2011  . Status post THR (total hip replacement) 07/26/2011  . Hip pain 07/26/2011  . Difficulty in walking(719.7) 07/26/2011  . DYSLIPIDEMIA 05/11/2009  . BIPOLAR AFFECTIVE DISORDER 05/11/2009  . CAD, UNSPECIFIED SITE 05/11/2009  . GERD 05/11/2009  . FIBROMYALGIA 05/11/2009  . DIZZINESS 05/11/2009  . Chest pain 05/11/2009   Aldona Lento, PTA   Aldona Lento 04/22/2015, 4:15 PM  [facility] [address] [place], [place], [address] Phone: [phone]   Fax:  [phone]

## 2015-04-26 ENCOUNTER — Ambulatory Visit (HOSPITAL_COMMUNITY): Payer: Medicare Other | Admitting: Physical Therapy

## 2015-04-26 DIAGNOSIS — M6281 Muscle weakness (generalized): Secondary | ICD-10-CM

## 2015-04-26 DIAGNOSIS — M4120 Other idiopathic scoliosis, site unspecified: Secondary | ICD-10-CM

## 2015-04-26 DIAGNOSIS — M25551 Pain in right hip: Secondary | ICD-10-CM | POA: Diagnosis not present

## 2015-04-26 DIAGNOSIS — M25561 Pain in right knee: Secondary | ICD-10-CM

## 2015-04-26 DIAGNOSIS — R262 Difficulty in walking, not elsewhere classified: Secondary | ICD-10-CM

## 2015-04-26 DIAGNOSIS — M25511 Pain in right shoulder: Secondary | ICD-10-CM

## 2015-04-26 DIAGNOSIS — M5441 Lumbago with sciatica, right side: Secondary | ICD-10-CM

## 2015-04-26 NOTE — Therapy (Signed)
[place], [place], [address] Phone: [phone]   Fax:  [phone]  Physical Therapy Treatment  Patient Details  Name: [name] [name] MRN: [id number] Date of Birth: 1941/04/21 Referring Provider:  Laurance Flatten, MD  Encounter Date: 04/26/2015      PT End of Session - 04/26/15 1637    Visit Number 6   Number of Visits 16   Date for PT Re-Evaluation 04/30/15   Authorization Type [employer] Medicare   Authorization - Visit Number 6   Authorization - Number of Visits 16   PT Start Time 9476   PT Stop Time 1455   PT Time Calculation (min) 70 min   Activity Tolerance Patient tolerated treatment well   Behavior During Therapy [facility] for tasks assessed/performed      Past Medical History  Diagnosis Date  . CAD (coronary artery disease)   . Chest pain   . Dizziness   . Dyslipidemia   . GERD (gastroesophageal reflux disease)   . Bipolar affective disorder   . Fibromyalgia   . H/O: hysterectomy   . Hyperlipidemia   . Arthritis   . Chronic fatigue   . Hypertension   . Hx of adenomatous colonic polyps 2005    Past Surgical History  Procedure Laterality Date  . 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  Remainder of colonic mucosa was normal  . [place] bunionectomy      bilateral  . Esophagogastroduodenoscopy      followed by colonoscopy with snare polypectomy  . Cystectomy      pilonidial cyst  . Breast lumpectomy    . Spinal fusion    . Right knee replacement  02/2013  . Breast biopsy Left 1989  . Shoulder arthroscopy Left 2005  . Bunionectomy Left 2005  . Spinal fusion  2004    L4-L5  . Total hip arthroplasty Right 06/2006  . Total shoulder replacement Left 08/2005  . Total hip arthroplasty  8/12  . Abdominal hysterectomy  1994    TAH- DUB, BSO  . Shoulder arthroscopy Right   . Colonoscopy with  esophagogastroduodenoscopy (egd) N/A 02/01/2014    FKC:LEXN erosive reflux/gastric polyp/normal rectum/scattered left sided diverticula, normal distal TI. gastric bx negative, fundic gland polyp. next TCS 01/2019.    There were no vitals filed for this visit.  Visit Diagnosis:  Knee pain, right  Muscle weakness (generalized)  Hip pain, right  Right-sided low back pain with right-sided sciatica  Idiopathic scoliosis  Difficulty walking  Right shoulder pain      Subjective Assessment - 04/26/15 1644    Subjective PT states her pain is constant.  Reports 6/10 in Rt knee wtih her shoulders feeling OK today.  Reports complaince with HEP   Currently in Pain? Yes   Pain Score 6    Pain Location Knee   Pain Orientation Right                         [facility] Adult PT Treatment/Exercise - 04/26/15 1401    Knee/Hip Exercises: Stretches   Active Hamstring Stretch 3 reps;30 seconds   Active Hamstring Stretch Limitations 12 inch box    Hip Flexor Stretch 2 reps;30 seconds   Hip Flexor Stretch Limitations 7" step   Gastroc Stretch 3 reps;30 seconds   Gastroc Stretch Limitations slantboard    Knee/Hip Exercises: Aerobic   Stationary Bike  nustep 10 minutes hills #3 level 3 UE (7), LE (5)   Knee/Hip Exercises: Standing   Other Standing Knee Exercises 3D hip excursions in standing within very limited ROM in order to mobilize hips/low back within safety of hip precautions  1x15   Other Standing Knee Exercises sidestepping 2RT   Knee/Hip Exercises: Seated   Other Seated Knee Exercises thoracic 3D excursions with UE's 10 reps each   Other Seated Knee Exercises Sit to stand 1x10 with 3 second eccentric lower    Modalities   Modalities Ultrasound   Ultrasound   Ultrasound Location Rt anterior lateral, medial and medial hamstring region   Ultrasound Parameters 1.5 w/cm2 3 MHz continuous x 8 minutes   Ultrasound Goals Pain                PT Education - 04/26/15 1642     Education provided Yes   Education Details Educated on importance of returning to [facility] to maintain actvity level and increase gains.    Person(s) Educated Patient   Methods Explanation   Comprehension Verbalized understanding          PT Short Term Goals - 04/22/15 1541    PT SHORT TERM GOAL #1   Title Patient will dmeonstrate 3/5 MMT with hip abduction indicatign improving hip stability and decreased severity in trendelenberg gait.    Status On-going   PT SHORT TERM GOAL #2   Title Patient will demosntrate increased glut max strength to 4-/5 to be bale to perofrm sit to stand with <2 UE support   Status On-going   PT SHORT TERM GOAL #3   Title Patient will dmeonstrate increased hamstring strength of 4/5MMT to bebale to go up stairs with only 1 hand rail support and step too pattern.    Status On-going   PT SHORT TERM GOAL #4   Title Patient will dmeosntrate a negative piriformis test.    Status On-going   PT SHORT TERM GOAL #5   Title Patient will be independent with HEP.    Status On-going   PT SHORT TERM GOAL #6   Title Patient will demonstrate an improvement of at least 10 degrees in all planes R shoulder with no increased pain            PT Long Term Goals - 04/22/15 1546    PT LONG TERM GOAL #1   Title Patient will demonstrate 4/5 MMT with hip abduction indicatign improving hip stability and decreased severity in trendelenberg gait.    PT LONG TERM GOAL #2   Title Patient will demosntrate increased glut max strength to 5/5 to be bale to perofrm sit to stand with <2 UE support   PT LONG TERM GOAL #3   Title Patient will demonstrate increased hamstring strength of 5/5MMT to be able to go up stairs with no hand rail support and step through pattern.   PT LONG TERM GOAL #4   Title Patient will be able to ambulate with a cain >90mintues with pain <3/10   PT LONG TERM GOAL #5   Title I with adcvanced HEP   PT LONG TERM GOAL #6   Title Patient will demonstrate improved  range by at least 15 degrees R shoulder, R shoulder strength at least 4/5, R shoulder pain no more than 2/10 on average                Plan - 04/26/15 1639    Clinical Impression Statement Discussed exercise progression and encouraged  to resume pool exercise/gym at [facility]. 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  Explained importance of increasing actvitiy and maintaining this actvity level for best results.  Progressed with UE movments with thoracic excursions and continued [place] to help decrease pain of Rt knee.     PT Next Visit Plan Continue [place] for pain.  Progress to UE/LE strengthening machines and add postural 3 theraband next session.  Sessions to be decreased to 45 minute session with patient returning to [facility].        Problem List Patient Active Problem List   Diagnosis Date Noted  . Chronic cough 11/23/2014  . Fecal incontinence 11/23/2014  . Constipation 09/22/2013  . Hx of adenomatous colonic polyps 09/22/2013  . S/P knee replacement 04/27/2013  . Knee pain 04/27/2013  . Muscle weakness (generalized) 04/27/2013  . Hypertension 10/15/2011  . Status post THR (total hip replacement) 07/26/2011  . Hip pain 07/26/2011  . Difficulty in walking(719.7) 07/26/2011  . DYSLIPIDEMIA 05/11/2009  . BIPOLAR AFFECTIVE DISORDER 05/11/2009  . CAD, UNSPECIFIED SITE 05/11/2009  . GERD 05/11/2009  . FIBROMYALGIA 05/11/2009  . DIZZINESS 05/11/2009  . Chest pain 05/11/2009    Teena Irani, PTA/CLT [phone]  04/26/2015, 4:45 PM  [place] [address] [place], [place], [address] Phone: [phone]   Fax:  [phone]

## 2015-04-28 ENCOUNTER — Ambulatory Visit (HOSPITAL_COMMUNITY): Payer: Medicare Other | Admitting: Physical Therapy

## 2015-04-28 DIAGNOSIS — M25511 Pain in right shoulder: Secondary | ICD-10-CM

## 2015-04-28 DIAGNOSIS — M6281 Muscle weakness (generalized): Secondary | ICD-10-CM

## 2015-04-28 DIAGNOSIS — R262 Difficulty in walking, not elsewhere classified: Secondary | ICD-10-CM

## 2015-04-28 DIAGNOSIS — M5441 Lumbago with sciatica, right side: Secondary | ICD-10-CM

## 2015-04-28 DIAGNOSIS — M25551 Pain in right hip: Secondary | ICD-10-CM | POA: Diagnosis not present

## 2015-04-28 DIAGNOSIS — M4120 Other idiopathic scoliosis, site unspecified: Secondary | ICD-10-CM

## 2015-04-28 DIAGNOSIS — M25561 Pain in right knee: Secondary | ICD-10-CM

## 2015-04-28 NOTE — Therapy (Signed)
[place], [place], [address] Phone: [phone]   Fax:  [phone]  Physical Therapy Treatment  Patient Details  Name: [name] [name] MRN: [id number] Date of Birth: 09/11/1941 Referring Provider:  Laurance Flatten, MD  Encounter Date: 04/28/2015      PT End of Session - 04/28/15 1226    Visit Number 7   Number of Visits 16   Date for PT Re-Evaluation 04/30/15   Authorization Type [employer] Medicare   Authorization - Visit Number 7   Authorization - Number of Visits 16   PT Start Time 1020   PT Stop Time 1115   PT Time Calculation (min) 55 min   Activity Tolerance Patient tolerated treatment well   Behavior During Therapy [facility] for tasks assessed/performed      Past Medical History  Diagnosis Date  . CAD (coronary artery disease)   . Chest pain   . Dizziness   . Dyslipidemia   . GERD (gastroesophageal reflux disease)   . Bipolar affective disorder   . Fibromyalgia   . H/O: hysterectomy   . Hyperlipidemia   . Arthritis   . Chronic fatigue   . Hypertension   . Hx of adenomatous colonic polyps 2005    Past Surgical History  Procedure Laterality Date  . 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  Remainder of colonic mucosa was normal  . [place] bunionectomy      bilateral  . Esophagogastroduodenoscopy      followed by colonoscopy with snare polypectomy  . Cystectomy      pilonidial cyst  . Breast lumpectomy    . Spinal fusion    . Right knee replacement  02/2013  . Breast biopsy Left 1989  . Shoulder arthroscopy Left 2005  . Bunionectomy Left 2005  . Spinal fusion  2004    L4-L5  . Total hip arthroplasty Right 06/2006  . Total shoulder replacement Left 08/2005  . Total hip arthroplasty  8/12  . Abdominal hysterectomy  1994    TAH- DUB, BSO  . Shoulder arthroscopy Right   . Colonoscopy with  esophagogastroduodenoscopy (egd) N/A 02/01/2014    XLE:ZVGJ erosive reflux/gastric polyp/normal rectum/scattered left sided diverticula, normal distal TI. gastric bx negative, fundic gland polyp. next TCS 01/2019.    There were no vitals filed for this visit.  Visit Diagnosis:  Knee pain, right  Muscle weakness (generalized)  Hip pain, right  Right-sided low back pain with right-sided sciatica  Idiopathic scoliosis  Difficulty walking  Right shoulder pain      Subjective Assessment - 04/28/15 1223    Subjective Pt reports she is about the same with pain in Rt knee of 6/10.  Pt states the [place] is helping to reduce her pain for longer periods of time.  PT has not yet went to [facility] to see about membership.    Currently in Pain? Yes   Pain Score 6    Pain Location Knee   Pain Orientation Right                         [facility] Adult PT Treatment/Exercise - 04/28/15 1224    Knee/Hip Exercises: Stretches   Active Hamstring Stretch 3 reps;30 seconds   Active Hamstring Stretch Limitations 12 inch box    Hip Flexor Stretch 2 reps;30 seconds   Hip Flexor Stretch Limitations 7" step   Gastroc  Stretch 3 reps;30 seconds   Gastroc Stretch Limitations slantboard    Knee/Hip Exercises: Aerobic   Stationary Bike nustep 10 minutes hills #3 level 3 UE (7), LE (5)   Knee/Hip Exercises: Machines for Strengthening   Cybex Knee Extension 1 PL 10 reps   Cybex Knee Flexion 2PL 10 reps   Knee/Hip Exercises: Standing   Other Standing Knee Exercises postural 3 UE therband , green 10X each   Knee/Hip Exercises: Seated   Other Seated Knee Exercises rows and chest press cybex 1 PL 10 reps each   Modalities   Modalities Ultrasound   Ultrasound   Ultrasound Location Rt anterior lateral, medial and medial hamstring region   Ultrasound Parameters 1.5 w/cm2 3 MHz continuous x 8 minutes   Ultrasound Goals Pain                  PT Short Term Goals - 04/22/15 1541    PT SHORT TERM  GOAL #1   Title Patient will dmeonstrate 3/5 MMT with hip abduction indicatign improving hip stability and decreased severity in trendelenberg gait.    Status On-going   PT SHORT TERM GOAL #2   Title Patient will demosntrate increased glut max strength to 4-/5 to be bale to perofrm sit to stand with <2 UE support   Status On-going   PT SHORT TERM GOAL #3   Title Patient will dmeonstrate increased hamstring strength of 4/5MMT to bebale to go up stairs with only 1 hand rail support and step too pattern.    Status On-going   PT SHORT TERM GOAL #4   Title Patient will dmeosntrate a negative piriformis test.    Status On-going   PT SHORT TERM GOAL #5   Title Patient will be independent with HEP.    Status On-going   PT SHORT TERM GOAL #6   Title Patient will demonstrate an improvement of at least 10 degrees in all planes R shoulder with no increased pain            PT Long Term Goals - 04/22/15 1546    PT LONG TERM GOAL #1   Title Patient will demonstrate 4/5 MMT with hip abduction indicatign improving hip stability and decreased severity in trendelenberg gait.    PT LONG TERM GOAL #2   Title Patient will demosntrate increased glut max strength to 5/5 to be bale to perofrm sit to stand with <2 UE support   PT LONG TERM GOAL #3   Title Patient will demonstrate increased hamstring strength of 5/5MMT to be able to go up stairs with no hand rail support and step through pattern.   PT LONG TERM GOAL #4   Title Patient will be able to ambulate with a cain >75mintues with pain <3/10   PT LONG TERM GOAL #5   Title I with adcvanced HEP   PT LONG TERM GOAL #6   Title Patient will demonstrate improved range by at least 15 degrees R shoulder, R shoulder strength at least 4/5, R shoulder pain no more than 2/10 on average                Plan - 04/28/15 1227    Clinical Impression Statement Introduced postural theraband exercises and pateint given theraband and written instructions for  home.  Began cybex strengthening machines as discussed last visit to progress strengtheing and encourage pateint to continue these exercises at the [facility].  Pt was able to complete all exericses without increase pain.  continued  with [place] to help decrease Rt knee pain.  No pain reported at end of session.   PT Next Visit Plan Continue [place] for pain.  Sessions to be decreased to 45 minute session with patient returning to [facility].         Problem List Patient Active Problem List   Diagnosis Date Noted  . Chronic cough 11/23/2014  . Fecal incontinence 11/23/2014  . Constipation 09/22/2013  . Hx of adenomatous colonic polyps 09/22/2013  . S/P knee replacement 04/27/2013  . Knee pain 04/27/2013  . Muscle weakness (generalized) 04/27/2013  . Hypertension 10/15/2011  . Status post THR (total hip replacement) 07/26/2011  . Hip pain 07/26/2011  . Difficulty in walking(719.7) 07/26/2011  . DYSLIPIDEMIA 05/11/2009  . BIPOLAR AFFECTIVE DISORDER 05/11/2009  . CAD, UNSPECIFIED SITE 05/11/2009  . GERD 05/11/2009  . FIBROMYALGIA 05/11/2009  . DIZZINESS 05/11/2009  . Chest pain 05/11/2009    Teena Irani, PTA/CLT [phone]  04/28/2015, 12:30 PM  [place] [address] [place], [place], [address] Phone: [phone]   Fax:  [phone]

## 2015-05-03 ENCOUNTER — Ambulatory Visit (HOSPITAL_COMMUNITY): Payer: Medicare Other | Admitting: Physical Therapy

## 2015-05-03 DIAGNOSIS — M5441 Lumbago with sciatica, right side: Secondary | ICD-10-CM

## 2015-05-03 DIAGNOSIS — M25551 Pain in right hip: Secondary | ICD-10-CM

## 2015-05-03 DIAGNOSIS — M25561 Pain in right knee: Secondary | ICD-10-CM

## 2015-05-03 DIAGNOSIS — M4120 Other idiopathic scoliosis, site unspecified: Secondary | ICD-10-CM

## 2015-05-03 DIAGNOSIS — M25511 Pain in right shoulder: Secondary | ICD-10-CM

## 2015-05-03 DIAGNOSIS — R262 Difficulty in walking, not elsewhere classified: Secondary | ICD-10-CM

## 2015-05-03 DIAGNOSIS — M6281 Muscle weakness (generalized): Secondary | ICD-10-CM

## 2015-05-03 NOTE — Therapy (Signed)
[place], [place], [address] Phone: [phone]   Fax:  [phone]  Physical Therapy Treatment (Re-Assessment)  Patient Details  Name: [name] [name] MRN: [id number] Date of Birth: 1941/09/03 Referring Provider:  Laurance Flatten, MD  Encounter Date: 05/03/2015      PT End of Session - 05/03/15 1323    Visit Number 8   Number of Visits 16   Date for PT Re-Evaluation 05/31/15   Authorization Type UHC Medicare   Authorization Time Period G-cdoe done 8th visit    Authorization - Visit Number 8   Authorization - Number of Visits 16   PT Start Time 1020   PT Stop Time 1100   PT Time Calculation (min) 40 min   Activity Tolerance Patient tolerated treatment well   Behavior During Therapy [facility] for tasks assessed/performed      Past Medical History  Diagnosis Date  . CAD (coronary artery disease)   . Chest pain   . Dizziness   . Dyslipidemia   . GERD (gastroesophageal reflux disease)   . Bipolar affective disorder   . Fibromyalgia   . H/O: hysterectomy   . Hyperlipidemia   . Arthritis   . Chronic fatigue   . Hypertension   . Hx of adenomatous colonic polyps 2005    Past Surgical History  Procedure Laterality Date  . Colonoscopy  01/29/2007    IHK:VQQVZD rectum, left-sided diverticula, 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  Remainder of colonic mucosa was normal  . [place] bunionectomy      bilateral  . Esophagogastroduodenoscopy      followed by colonoscopy with snare polypectomy  . Cystectomy      pilonidial cyst  . Breast lumpectomy    . Spinal fusion    . Right knee replacement  02/2013  . Breast biopsy Left 1989  . Shoulder arthroscopy Left 2005  . Bunionectomy Left 2005  . Spinal fusion  2004    L4-L5  . Total hip arthroplasty Right 06/2006  . Total shoulder replacement Left 08/2005  . Total hip arthroplasty  8/12  . Abdominal hysterectomy  1994    TAH- DUB, BSO  .  Shoulder arthroscopy Right   . Colonoscopy with esophagogastroduodenoscopy (egd) N/A 02/01/2014    GLO:VFIE erosive reflux/gastric polyp/normal rectum/scattered left sided diverticula, normal distal TI. gastric bx negative, fundic gland polyp. next TCS 01/2019.    There were no vitals filed for this visit.  Visit Diagnosis:  Knee pain, right  Muscle weakness (generalized)  Hip pain, right  Right-sided low back pain with right-sided sciatica  Idiopathic scoliosis  Difficulty walking  Right shoulder pain      Subjective Assessment - 05/03/15 1030    Subjective Patient reports that she continuing to have R knee pain, she thinks partially from her sciatica and partially because of maybe overusing it. Has been trying to go up and down stairs more frequently. Has still not been to [facility] about membership, but is still planning to go.    Pertinent History Rt knee replacment May 2014, and Lt hip and Rt hip rplaced, in addition to Lt shoulder replacment, spinal fusion and history of scoliosis and stenosis. Patient has had 5 oor 6 near falls in last 6 months. Requires UE support for ambulating up and down stairs.    How long can you sit comfortably? 6/21- no limits, usually sits in recliner    How long can you stand comfortably? 6/21- 20 minutes  How long can you walk comfortably? 6/21- with cane around 30 minutes, without cane very short distances    Patient Stated Goals To increase LE strength, balance and gait. Patient wants to decrease pain on Rt hip and Rt knee, to improve gait and balance. To decrease back pain and gt back stronger. And In ddition to improve shoulder strength.    Currently in Pain? Yes   Pain Score 5    Pain Location Knee   Pain Orientation Right            [facility] PT Assessment - 05/03/15 0001    Observation/Other Assessments   Focus on Therapeutic Outcomes (FOTO)  41% limited    AROM   Right Hip External Rotation  --  WNL    Right Hip Internal Rotation  --   WNL    Left Hip External Rotation  --  WNL    Left Hip Internal Rotation  --  WNL    Right Knee Extension 0   Right Knee Flexion 121   Left Knee Extension 0   Left Knee Flexion 133   Lumbar Flexion 88   Lumbar Extension 30   Lumbar - Right Rotation --  mild limitation    Lumbar - Left Rotation --  mild limitation    Strength   Right Hip Flexion 4-/5   Right Hip ABduction 2+/5   Left Hip Flexion 4/5   Left Hip ABduction 3-/5   Right Knee Flexion 4-/5   Right Knee Extension 4/5   Left Knee Flexion 4-/5   Left Knee Extension 4+/5   Right Ankle Dorsiflexion 5/5   Left Ankle Dorsiflexion 5/5                     [facility] Adult PT Treatment/Exercise - 05/03/15 0001    Knee/Hip Exercises: Stretches   Active Hamstring Stretch 3 reps;30 seconds   Active Hamstring Stretch Limitations stairs    Hip Flexor Stretch 3 reps;30 seconds   Hip Flexor Stretch Limitations 7" step    Gastroc Stretch 3 reps;30 seconds   Gastroc Stretch Limitations slantboard                 PT Education - 05/03/15 1319    Education provided Yes   Education Details progress with skilled PT services, plan of care moving forward, continued to encourage patient to join [employer]) Educated Patient   Methods Explanation   Comprehension Verbalized understanding          PT Short Term Goals - 05/03/15 1054    PT SHORT TERM GOAL #1   Title Patient will dmeonstrate 3/5 MMT with hip abduction indicatign improving hip stability and decreased severity in trendelenberg gait.    Time 4   Period Weeks   Status On-going   PT SHORT TERM GOAL #2   Title Patient will demosntrate increased glut max strength to 4-/5 to be bale to perofrm sit to stand with <2 UE support   Baseline 6/21- able to stand up without UEs but appears to continue to demonstrate some glut weakness    Time 4   Period Weeks   Status Partially Met   PT SHORT TERM GOAL #3   Title Patient will dmeonstrate increased  hamstring strength of 4/5MMT to bebale to go up stairs with only 1 hand rail support and step too pattern.    Time 4   Period Weeks   Status On-going   PT SHORT  TERM GOAL #4   Title Patient will dmeosntrate a negative piriformis test.    Time 4   Period Weeks   Status On-going   PT SHORT TERM GOAL #5   Title Patient will be independent with HEP.    Baseline 6/21- states she is only doing it once a day but is consistently doing it every day    Period Weeks   Status Achieved   PT SHORT TERM GOAL #6   Title Patient will demonstrate an improvement of at least 10 degrees in all planes R shoulder with no increased pain    Time 4   Period Weeks   Status New           PT Long Term Goals - 05/03/15 1057    PT LONG TERM GOAL #1   Title Patient will demonstrate 4/5 MMT with hip abduction indicatign improving hip stability and decreased severity in trendelenberg gait.    Time 8   Period Weeks   Status On-going   PT LONG TERM GOAL #2   Title Patient will demosntrate increased glut max strength to 5/5 to be bale to perofrm sit to stand with <2 UE support   Time 8   Period Weeks   Status On-going   PT LONG TERM GOAL #3   Title Patient will demonstrate increased hamstring strength of 5/5MMT to be able to go up stairs with minimal  hand rail support and step through pattern.   Time 8   Period Weeks   Status Revised   PT LONG TERM GOAL #4   Title Patient will be able to ambulate with a cain >71mntues with pain <3/10   Time 8   Period Weeks   Status On-going   PT LONG TERM GOAL #5   Title I with adcvanced HEP   Time 8   Period Weeks   Status New   Additional Long Term Goals   Additional Long Term Goals Yes   PT LONG TERM GOAL #6   Title Patient will demonstrate improved range by at least 15 degrees R shoulder, R shoulder strength at least 4/5, R shoulder pain no more than 2/10 on average    Time 8   Period Weeks   Status New   PT LONG TERM GOAL #7   Title Patient will  demonstrate the ability to be able to put both of her hands behind her back to assist in functional tasks such as dressing    Time 8   Period Weeks   Status New               Plan - 05/03/15 1324    Clinical Impression Statement Re-assessment performed today. Patient continues to demonstrate weakness in bilateral knees (R greater than L), postural and gait impairments, reduced functional activity tolerance, weakness in bilateral lower extremitiy and proximal musculature, and overall reduced functional task performance skills. Patient continues to state that [employer] are helping to reduce her pain for longer periods of time; she also reports [facility] that she is still planning on doing so. Patient will benefit from continued skilled PT services, at 2x/week for another 4 weeks, to continue to address her deficits and assist her in reaching an overall optimal level of function.    Pt will benefit from skilled therapeutic intervention in order to improve on the following deficits Abnormal gait;Pain;Decreased strength;Difficulty walking;Decreased mobility;Decreased activity tolerance;Decreased range of  motion;Decreased balance;Improper body mechanics;Postural dysfunction;Decreased endurance   Rehab Potential Fair   Clinical Impairments Affecting Rehab Potential long history of pain.    PT Frequency 2x / week   PT Duration 4 weeks   PT Treatment/Interventions Therapeutic exercise;Balance training;Neuromuscular re-education;Patient/family education;Stair training;Gait training;Manual techniques;Therapeutic activities;Electrical Stimulation;Functional mobility training   PT Next Visit Plan Continue [place] for pain.  Sessions to be decreased to 45 minute session with patient returning to [facility].    PT Home Exercise Plan given for hip flexor/hamstring stretches.  supine bridge, KTC and SLR.  sidelying clams   Consulted and Agree with Plan of Care  Patient          G-Codes - 10-May-2015 1341    Functional Assessment Tool Used FOTO 41% limited, was 66% limited    Functional Limitation Mobility: Walking and moving around   Mobility: Walking and Moving Around Current Status [phone]) At least 40 percent but less than 60 percent impaired, limited or restricted   Mobility: Walking and Moving Around Goal Status [phone]) At least 20 percent but less than 40 percent impaired, limited or restricted      Problem List Patient Active Problem List   Diagnosis Date Noted  . Chronic cough 11/23/2014  . Fecal incontinence 11/23/2014  . Constipation 09/22/2013  . Hx of adenomatous colonic polyps 09/22/2013  . S/P knee replacement 04/27/2013  . Knee pain 04/27/2013  . Muscle weakness (generalized) 04/27/2013  . Hypertension 10/15/2011  . Status post THR (total hip replacement) 07/26/2011  . Hip pain 07/26/2011  . Difficulty in walking(719.7) 07/26/2011  . DYSLIPIDEMIA 05/11/2009  . BIPOLAR AFFECTIVE DISORDER 05/11/2009  . CAD, UNSPECIFIED SITE 05/11/2009  . GERD 05/11/2009  . FIBROMYALGIA 05/11/2009  . DIZZINESS 05/11/2009  . Chest pain 05/11/2009    Physical Therapy Progress Note  Dates of Reporting Period: 03/31/15 to 05-10-2015    Objective Reports of Subjective Statement: Patient continues to report that she is still having pain and that her progression is slower than she personally expected. Does state that the [place] has been helpful in reducing her pain.   Objective Measurements: See above   Goal Update: See above   Plan: See above for details; otherwise continue at 2x/week for 4 more weeks.   Reason Skilled Services are Required: Weakness in bilateral LEs and proximal muscles, postural and gait impairments, reduced functional activity tolerance, pain especially R LE, reduced functional task performance skills.     Deniece Ree PT, DPT [phone]  [place], [place], [address] Phone: [phone]   Fax:  [phone]

## 2015-05-05 ENCOUNTER — Ambulatory Visit (HOSPITAL_COMMUNITY): Payer: Medicare Other

## 2015-05-05 DIAGNOSIS — M4120 Other idiopathic scoliosis, site unspecified: Secondary | ICD-10-CM

## 2015-05-05 DIAGNOSIS — M25511 Pain in right shoulder: Secondary | ICD-10-CM

## 2015-05-05 DIAGNOSIS — M25561 Pain in right knee: Secondary | ICD-10-CM

## 2015-05-05 DIAGNOSIS — R262 Difficulty in walking, not elsewhere classified: Secondary | ICD-10-CM

## 2015-05-05 DIAGNOSIS — M25551 Pain in right hip: Secondary | ICD-10-CM | POA: Diagnosis not present

## 2015-05-05 DIAGNOSIS — M6281 Muscle weakness (generalized): Secondary | ICD-10-CM

## 2015-05-05 DIAGNOSIS — M5441 Lumbago with sciatica, right side: Secondary | ICD-10-CM

## 2015-05-05 NOTE — Patient Instructions (Signed)
ABDUCTION: Isometric   With ball between wall and right knee, squeeze toward wall. Hold 5-10  seconds. Complete 1-2 sets of 10-20 repetitions.  http://gtsc.exer.us/93   Copyright  VHI. All rights reserved.

## 2015-05-05 NOTE — Therapy (Signed)
[place], [name], [address] Phone: [phone]   Fax:  [phone]  Physical Therapy Treatment  Patient Details  Name: [name] [name] MRN: [id number] Date of Birth: [birth year] Referring Provider:  Sinda Du, MD  Encounter Date: 05/05/2015      PT End of Session - 05/05/15 1159    Visit Number 9   Number of Visits 16   Date for PT Re-Evaluation 05/31/15   Authorization Type UHC Medicare   Authorization Time Period G-cdoe done 8th visit    Authorization - Visit Number 9   Authorization - Number of Visits 16   PT Start Time 3474   PT Stop Time 1105   PT Time Calculation (min) 47 min   Activity Tolerance Patient tolerated treatment well   Behavior During Therapy [facility] for tasks assessed/performed      Past Medical History  Diagnosis Date  . CAD (coronary artery disease)   . Chest pain   . Dizziness   . Dyslipidemia   . GERD (gastroesophageal reflux disease)   . Bipolar affective disorder   . Fibromyalgia   . H/O: hysterectomy   . Hyperlipidemia   . Arthritis   . Chronic fatigue   . Hypertension   . Hx of adenomatous colonic polyps 2005    Past Surgical History  Procedure Laterality Date  . Colonoscopy  01/29/2007    QVZ:DGLOVF rectum, left-sided diverticula, 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  Remainder of colonic mucosa was normal  . [place] bunionectomy      bilateral  . Esophagogastroduodenoscopy      followed by colonoscopy with snare polypectomy  . Cystectomy      pilonidial cyst  . Breast lumpectomy    . Spinal fusion    . Right knee replacement  02/2013  . Breast biopsy Left 1989  . Shoulder arthroscopy Left 2005  . Bunionectomy Left 2005  . Spinal fusion  2004    L4-L5  . Total hip arthroplasty Right 06/2006  . Total shoulder replacement Left 08/2005  . Total hip arthroplasty  8/12  . Abdominal hysterectomy  1994    TAH- DUB, BSO  . Shoulder  arthroscopy Right   . Colonoscopy with esophagogastroduodenoscopy (egd) N/A 02/01/2014    IEP:PIRJ erosive reflux/gastric polyp/normal rectum/scattered left sided diverticula, normal distal TI. gastric bx negative, fundic gland polyp. next TCS 01/2019.    There were no vitals filed for this visit.  Visit Diagnosis:  Knee pain, right  Muscle weakness (generalized)  Hip pain, right  Right-sided low back pain with right-sided sciatica  Idiopathic scoliosis  Difficulty walking  Right shoulder pain      Subjective Assessment - 05/05/15 1023    Subjective Pt stated she is feeling good today, proximal Rt LE pain scale 4/10   Currently in Pain? Yes   Pain Score 4    Pain Location Leg   Pain Orientation Right;Proximal   Pain Descriptors / Indicators Patsi Sears Adult PT Treatment/Exercise - 05/05/15 0001    Knee/Hip Exercises: Stretches   Active Hamstring Stretch 3 reps;30 seconds   Active Hamstring Stretch Limitations stairs    Hip Flexor Stretch 3 reps;30 seconds   Hip Flexor Stretch Limitations 7" step    Gastroc Stretch 3 reps;30 seconds   Gastroc Stretch Limitations slantboard    Knee/Hip  Exercises: Aerobic   Stationary Bike Nustep unavailable   Knee/Hip Exercises: Machines for Strengthening   Cybex Knee Extension 1 PL 10 reps   Cybex Knee Flexion 2PL 10 reps   Knee/Hip Exercises: Standing   Knee Flexion Both;10 reps   Lateral Step Up Right;10 reps;Hand Hold: 2;Step Height: 4";Step Height: 2"   Functional Squat 10 reps   Functional Squat Limitations minisquat   Other Standing Knee Exercises sidestepping 2RT   Knee/Hip Exercises: Seated   Other Seated Knee/Hip Exercises rows and chest press cybex 1 PL 10 reps each   Knee/Hip Exercises: Supine   Other Supine Knee/Hip Exercises abdominal squeezes with visual cues of traffic cone on stomach 2x20   Other Supine Knee/Hip Exercises Isometric abduction           PT Education -  05/05/15 1204    Education provided Yes   Education Details HEP for isometric abduction in supine postiion   Person(s) Educated Patient   Methods Explanation;Demonstration   Comprehension Verbalized understanding;Returned demonstration          PT Short Term Goals - 05/05/15 1204    PT SHORT TERM GOAL #1   Title Patient will dmeonstrate 3/5 MMT with hip abduction indicatign improving hip stability and decreased severity in trendelenberg gait.    Status On-going   PT SHORT TERM GOAL #2   Title Patient will demosntrate increased glut max strength to 4-/5 to be bale to perofrm sit to stand with <2 UE support   Status On-going   PT SHORT TERM GOAL #3   Title Patient will dmeonstrate increased hamstring strength of 4/5MMT to bebale to go up stairs with only 1 hand rail support and step too pattern.    Status On-going   PT SHORT TERM GOAL #4   Title Patient will dmeosntrate a negative piriformis test.    Status On-going   PT SHORT TERM GOAL #5   Title Patient will be independent with HEP.    Status Achieved   PT SHORT TERM GOAL #6   Title Patient will demonstrate an improvement of at least 10 degrees in all planes R shoulder with no increased pain            PT Long Term Goals - 05/05/15 1204    PT LONG TERM GOAL #1   Title Patient will demonstrate 4/5 MMT with hip abduction indicatign improving hip stability and decreased severity in trendelenberg gait.    Status On-going   PT LONG TERM GOAL #2   Title Patient will demosntrate increased glut max strength to 5/5 to be bale to perofrm sit to stand with <2 UE support   Status On-going   PT LONG TERM GOAL #3   Title Patient will demonstrate increased hamstring strength of 5/5MMT to be able to go up stairs with minimal  hand rail support and step through pattern.   PT LONG TERM GOAL #4   Title Patient will be able to ambulate with a cain >70mintues with pain <3/10   Status On-going   PT LONG TERM GOAL #5   Title I with adcvanced  HEP   Status On-going   PT LONG TERM GOAL #6   Title Patient will demonstrate improved range by at least 15 degrees R shoulder, R shoulder strength at least 4/5, R shoulder pain no more than 2/10 on average    PT LONG TERM GOAL #7   Title Patient will demonstrate the ability to be able to put both of her  hands behind her back to assist in functional tasks such as dressing                Plan - 05/05/15 1200    Clinical Impression Statement Session focus on functional strenghtening with focus on gluteal and hip muscualture and education on importance of proper posture to reduce pain and improve gait impairements.  Pt given isometric abduction as a HEP for gluteal medius strenghtening.  Ended session with [place] to Rt knee, pt reported pain reduced at end of session with improved gait Dealer.   PT Next Visit Plan Continue [place] for pain.  Sessions to be decreased to 45 minute session with patient returning to [facility].         Problem List Patient Active Problem List   Diagnosis Date Noted  . Chronic cough 11/23/2014  . Fecal incontinence 11/23/2014  . Constipation 09/22/2013  . Hx of adenomatous colonic polyps 09/22/2013  . S/P knee replacement 04/27/2013  . Knee pain 04/27/2013  . Muscle weakness (generalized) 04/27/2013  . Hypertension 10/15/2011  . Status post THR (total hip replacement) 07/26/2011  . Hip pain 07/26/2011  . Difficulty in walking(719.7) 07/26/2011  . DYSLIPIDEMIA 05/11/2009  . BIPOLAR AFFECTIVE DISORDER 05/11/2009  . CAD, UNSPECIFIED SITE 05/11/2009  . GERD 05/11/2009  . FIBROMYALGIA 05/11/2009  . DIZZINESS 05/11/2009  . Chest pain 05/11/2009   Aldona Lento, PTA  Aldona Lento 05/05/2015, 12:06 PM  [place] [address] [place], [name], [address] Phone: [phone]   Fax:  [phone]

## 2015-05-10 ENCOUNTER — Ambulatory Visit (HOSPITAL_COMMUNITY): Payer: Medicare Other

## 2015-05-12 ENCOUNTER — Ambulatory Visit (HOSPITAL_COMMUNITY): Payer: Medicare Other | Attending: Pulmonary Disease

## 2015-05-12 DIAGNOSIS — M5441 Lumbago with sciatica, right side: Secondary | ICD-10-CM | POA: Insufficient documentation

## 2015-05-12 DIAGNOSIS — M6281 Muscle weakness (generalized): Secondary | ICD-10-CM

## 2015-05-12 DIAGNOSIS — M25551 Pain in right hip: Secondary | ICD-10-CM | POA: Insufficient documentation

## 2015-05-12 DIAGNOSIS — Z96651 Presence of right artificial knee joint: Secondary | ICD-10-CM | POA: Insufficient documentation

## 2015-05-12 DIAGNOSIS — M25511 Pain in right shoulder: Secondary | ICD-10-CM | POA: Diagnosis not present

## 2015-05-12 DIAGNOSIS — R262 Difficulty in walking, not elsewhere classified: Secondary | ICD-10-CM | POA: Diagnosis not present

## 2015-05-12 DIAGNOSIS — M4120 Other idiopathic scoliosis, site unspecified: Secondary | ICD-10-CM

## 2015-05-12 DIAGNOSIS — Z96641 Presence of right artificial hip joint: Secondary | ICD-10-CM | POA: Diagnosis not present

## 2015-05-12 DIAGNOSIS — M25561 Pain in right knee: Secondary | ICD-10-CM | POA: Diagnosis present

## 2015-05-13 ENCOUNTER — Encounter (HOSPITAL_COMMUNITY): Payer: Self-pay
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
## 2015-05-19 ENCOUNTER — Telehealth (HOSPITAL_COMMUNITY): Payer: Self-pay

## 2015-05-19 NOTE — Telephone Encounter (Signed)
She will be out of town

## 2015-05-24 ENCOUNTER — Encounter (HOSPITAL_COMMUNITY): Payer: Medicare Other

## 2015-05-26 ENCOUNTER — Ambulatory Visit (HOSPITAL_COMMUNITY): Payer: Medicare Other | Attending: Pulmonary Disease

## 2015-05-26 DIAGNOSIS — M4120 Other idiopathic scoliosis, site unspecified: Secondary | ICD-10-CM | POA: Insufficient documentation

## 2015-05-26 DIAGNOSIS — M5441 Lumbago with sciatica, right side: Secondary | ICD-10-CM | POA: Diagnosis present

## 2015-05-26 DIAGNOSIS — M25561 Pain in right knee: Secondary | ICD-10-CM | POA: Diagnosis not present

## 2015-05-26 DIAGNOSIS — R262 Difficulty in walking, not elsewhere classified: Secondary | ICD-10-CM | POA: Diagnosis present

## 2015-05-26 DIAGNOSIS — M25551 Pain in right hip: Secondary | ICD-10-CM | POA: Diagnosis present

## 2015-05-26 DIAGNOSIS — M6281 Muscle weakness (generalized): Secondary | ICD-10-CM | POA: Insufficient documentation

## 2015-05-26 NOTE — Therapy (Signed)
[place], [place], [address] Phone: [phone]   Fax:  [phone]  Physical Therapy Treatment  Patient Details  Name: [name] [name] MRN: [id number] Date of Birth: April 13, 1941 Referring Provider:  Laurance Flatten, MD  Encounter Date: 05/26/2015      PT End of Session - 05/26/15 1020    Visit Number 11   Number of Visits 16   Date for PT Re-Evaluation 05/31/15   Authorization Type U[employer] Medicare   Authorization Time Period G-cdoe done 8th visit    Authorization - Visit Number 11   Authorization - Number of Visits 18   PT Start Time 0950   PT Stop Time 1020   PT Time Calculation (min) 30 min   Activity Tolerance Patient limited by pain   Behavior During Therapy [facility] for tasks assessed/performed      Past Medical History  Diagnosis Date  . CAD (coronary artery disease)   . Chest pain   . Dizziness   . Dyslipidemia   . GERD (gastroesophageal reflux disease)   . Bipolar affective disorder   . Fibromyalgia   . H/O: hysterectomy   . Hyperlipidemia   . Arthritis   . Chronic fatigue   . Hypertension   . Hx of adenomatous colonic polyps 2005    Past Surgical History  Procedure Laterality Date  . Colonoscopy  01/29/2007    ONG:EXBMWU rectum, left-sided diverticula, 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  Remainder of colonic mucosa was normal  . [place] bunionectomy      bilateral  . Esophagogastroduodenoscopy      followed by colonoscopy with snare polypectomy  . Cystectomy      pilonidial cyst  . Breast lumpectomy    . Spinal fusion    . Right knee replacement  02/2013  . Breast biopsy Left 1989  . Shoulder arthroscopy Left 2005  . Bunionectomy Left 2005  . Spinal fusion  2004    L4-L5  . Total hip arthroplasty Right 06/2006  . Total shoulder replacement Left 08/2005  . Total hip arthroplasty  8/12  . Abdominal hysterectomy  1994    TAH- DUB, BSO  . Shoulder arthroscopy Right    . Colonoscopy with esophagogastroduodenoscopy (egd) N/A 02/01/2014    XLK:GMWN erosive reflux/gastric polyp/normal rectum/scattered left sided diverticula, normal distal TI. gastric bx negative, fundic gland polyp. next TCS 01/2019.    There were no vitals filed for this visit.  Visit Diagnosis:  Knee pain, right  Muscle weakness (generalized)  Hip pain, right  Right-sided low back pain with right-sided sciatica  Idiopathic scoliosis  Difficulty walking      Subjective Assessment - 05/26/15 0953    Subjective Lt 20 minutes late for apt today.  Pt stated she has been out of town over weekend to [place], has not completed HEP last week and has not joined [facility]   Currently in Pain? Yes   Pain Score 6    Pain Location Leg   Pain Orientation Right            [facility] Adult PT Treatment/Exercise - 05/26/15 0001    Knee/Hip Exercises: Machines for Strengthening   Cybex Knee Extension 1 PL 2x10 reps   Knee/Hip Exercises: Standing   Lateral Step Up 10 reps;Hand Hold: 2;Step Height: 4";Both   Forward Step Up Both;10 reps;Hand Hold: 1;Step Height: 4"   Other Standing Knee Exercises sidestepping 2RT   Knee/Hip Exercises: Seated   Other Seated Knee/Hip  Exercises rows and chest press cybex 1 PL 15reps each   Modalities   Modalities Ultrasound   Ultrasound   Ultrasound Location Rt knee perimeter   Ultrasound Parameters 1.5 w/cm2 3MHz 100%   Ultrasound Goals Pain            PT Short Term Goals - 05/26/15 1103    PT SHORT TERM GOAL #1   Title Patient will dmeonstrate 3/5 MMT with hip abduction indicatign improving hip stability and decreased severity in trendelenberg gait.    Status On-going   PT SHORT TERM GOAL #2   Title Patient will demosntrate increased glut max strength to 4-/5 to be bale to perofrm sit to stand with <2 UE support   Status On-going   PT SHORT TERM GOAL #3   Title Patient will dmeonstrate increased hamstring strength of 4/5MMT to bebale to go up stairs  with only 1 hand rail support and step too pattern.    Status On-going   PT SHORT TERM GOAL #4   Title Patient will dmeosntrate a negative piriformis test.    PT SHORT TERM GOAL #5   Title Patient will be independent with HEP.    PT SHORT TERM GOAL #6   Title Patient will demonstrate an improvement of at least 10 degrees in all planes R shoulder with no increased pain            PT Long Term Goals - 05/26/15 1103    PT LONG TERM GOAL #1   Title Patient will demonstrate 4/5 MMT with hip abduction indicatign improving hip stability and decreased severity in trendelenberg gait.    PT LONG TERM GOAL #2   Title Patient will demosntrate increased glut max strength to 5/5 to be bale to perofrm sit to stand with <2 UE support   PT LONG TERM GOAL #3   Title Patient will demonstrate increased hamstring strength of 5/5MMT to be able to go up stairs with minimal  hand rail support and step through pattern.   PT LONG TERM GOAL #4   Title Patient will be able to ambulate with a cain >39mintues with pain <3/10   PT LONG TERM GOAL #5   Title I with adcvanced HEP   PT LONG TERM GOAL #6   Title Patient will demonstrate improved range by at least 15 degrees R shoulder, R shoulder strength at least 4/5, R shoulder pain no more than 2/10 on average    PT LONG TERM GOAL #7   Title Patient will demonstrate the ability to be able to put both of her hands behind her back to assist in functional tasks such as dressing                Plan - 05/26/15 1055    Clinical Impression Statement Pt 20 minutes late for apt today.  Pt reports she has not completed HEP this past week and has yet to join the [facility] though does plan to join this week.  Pt educated and encouraged to increase frequency with HEP for maximal benefits.  Pt continues to show minimal progress towards goals evidenced by minimal increased activity tolerance in community ambulation and continues to be limited by pain.  Session focus on improving  functional strengthening with cueing to reduce compensation due to weak glut med and quad musculature.  Ended sesssion with [place] to Rt knee per pt. request with reports of pain reduced to 3/10.  Pt encouraged to complete HEP and stretches at home following  session.     PT Next Visit Plan Continue [place] for pain.  Sessions to be decreased to 45 minute session with patient returning to [facility]. Continue functional strengthening per PT POC.        Problem List Patient Active Problem List   Diagnosis Date Noted  . Chronic cough 11/23/2014  . Fecal incontinence 11/23/2014  . Constipation 09/22/2013  . Hx of adenomatous colonic polyps 09/22/2013  . S/P knee replacement 04/27/2013  . Knee pain 04/27/2013  . Muscle weakness (generalized) 04/27/2013  . Hypertension 10/15/2011  . Status post THR (total hip replacement) 07/26/2011  . Hip pain 07/26/2011  . Difficulty in walking(719.7) 07/26/2011  . DYSLIPIDEMIA 05/11/2009  . BIPOLAR AFFECTIVE DISORDER 05/11/2009  . CAD, UNSPECIFIED SITE 05/11/2009  . GERD 05/11/2009  . FIBROMYALGIA 05/11/2009  . DIZZINESS 05/11/2009  . Chest pain 05/11/2009   Ihor [place], [place]; [place]  Aldona Lento 05/26/2015, 11:07 AM  [place], [place], [address] Phone: [phone]   Fax:  [phone]

## 2015-05-30 ENCOUNTER — Telehealth (HOSPITAL_COMMUNITY): Payer: Self-pay | Admitting: Physical Therapy

## 2015-05-30 NOTE — Telephone Encounter (Signed)
Patient requested to be D/C she will go the "y" and continue on her own.

## 2015-05-31 ENCOUNTER — Ambulatory Visit (HOSPITAL_COMMUNITY): Payer: Medicare Other

## 2015-06-02 ENCOUNTER — Encounter (HOSPITAL_COMMUNITY): Payer: Medicare Other

## 2015-06-07 ENCOUNTER — Encounter (HOSPITAL_COMMUNITY): Payer: Medicare Other

## 2015-06-09 ENCOUNTER — Encounter (HOSPITAL_COMMUNITY): Payer: Medicare Other | Admitting: Physical Therapy

## 2015-08-04 ENCOUNTER — Ambulatory Visit (INDEPENDENT_AMBULATORY_CARE_PROVIDER_SITE_OTHER): Payer: Medicare Other | Admitting: Certified Nurse Midwife

## 2015-08-04 ENCOUNTER — Encounter: Payer: Self-pay | Admitting: Certified Nurse Midwife

## 2015-08-04 VITALS — BP 118/60 | HR 92 | Resp 15 | Ht <= 58 in | Wt 137.0 lb

## 2015-08-04 DIAGNOSIS — K5909 Other constipation: Secondary | ICD-10-CM | POA: Diagnosis not present

## 2015-08-04 DIAGNOSIS — Z01419 Encounter for gynecological examination (general) (routine) without abnormal findings: Secondary | ICD-10-CM | POA: Diagnosis not present

## 2015-08-04 DIAGNOSIS — E039 Hypothyroidism, unspecified: Secondary | ICD-10-CM | POA: Diagnosis not present

## 2015-08-04 LAB — TSH: TSH: 2.577 u[IU]/mL (ref 0.350–4.500)

## 2015-08-04 NOTE — Patient Instructions (Signed)

## 2015-08-04 NOTE — Progress Notes (Signed)
Patient ID: [name] [name], female   DOB: 1941-08-08, 74 y.o.   MRN: [id number] 74 y.o. [id number] Widowed  Caucasian Fe here for annual exam. Menopausal on ERT.Has been weaning down for the past 3 months and plans to be off soon.  Denies vaginal bleeding or vaginal dryness. Patient having left knee issues and now seeing acupuncture with some relief and seeing Orthopedic also. Still having occasional incontinence with waiting too long. Feels better emotionally this year after spouse death last year. Getting out of the house for church and social outings now. Patient did life line screening 5/16 all normal. Sees PCP for medication management and aex. Some constipation issues, but has BM at least every 3 days. Has increased water intake, but does not like Miralax. No other health issues today. Yolanda Bonine recently married and she attended the wedding!  Patient's last menstrual period was 02/10/1993.          Sexually active: No.  The current method of family planning is hysterectomy/ post menopausal status.    Exercising: No.  The patient does not participate in regular exercise at present. Smoker:  no  Health Maintenance: Pap:  07-06-02 Neg per patient MMG:  08-12-2014 WNL Colonoscopy:  02-01-14 repeat in 5 yrs  BMD:   2015 osteopenia per patient  With PCP management on Vit. D and multi vitamin with calcium TDaP:  11-12-12 Labs: PCP Self Breast Exam: not often   reports that she has never smoked. She has never used smokeless tobacco. She reports that she drinks about 3.5 oz of alcohol per week. She reports that she does not use illicit drugs.  Past Medical History  Diagnosis Date  . CAD (coronary artery disease)   . Chest pain   . Dizziness   . Dyslipidemia   . GERD (gastroesophageal reflux disease)   . Bipolar affective disorder   . Fibromyalgia   . H/O: hysterectomy   . Hyperlipidemia   . Arthritis   . Chronic fatigue   . Hypertension   . Hx of adenomatous colonic polyps 2005  . Osteopenia      Past Surgical History  Procedure Laterality Date  . Colonoscopy  01/29/2007    IOX:BDZHGD rectum, left-sided diverticula, 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  Remainder of colonic mucosa was normal  . [place] bunionectomy      bilateral  . Esophagogastroduodenoscopy      followed by colonoscopy with snare polypectomy  . Cystectomy      pilonidial cyst  . Breast lumpectomy    . Spinal fusion    . Right knee replacement  02/2013  . Breast biopsy Left 1989  . Shoulder arthroscopy Left 2005  . Bunionectomy Left 2005  . Spinal fusion  2004    L4-L5  . Total hip arthroplasty Right 06/2006  . Total shoulder replacement Left 08/2005  . Total hip arthroplasty  8/12  . Abdominal hysterectomy  1994    TAH- DUB, BSO  . Shoulder arthroscopy Right   . Colonoscopy with esophagogastroduodenoscopy (egd) N/A 02/01/2014    JME:QAST erosive reflux/gastric polyp/normal rectum/scattered left sided diverticula, normal distal TI. gastric bx negative, fundic gland polyp. next TCS 01/2019.    Current Outpatient Prescriptions  Medication Sig Dispense Refill  . ALPRAZolam (XANAX) 0.5 MG tablet Take 1 tablet by mouth daily as needed. anxiety    . oxyCODONE (OXY IR/ROXICODONE) 5 MG immediate release tablet Take 5 mg by mouth every 4 (four) hours as needed for severe pain.    Marland Kitchen  aspirin 81 MG tablet Take 81 mg by mouth daily.    . Cholecalciferol (VITAMIN D PO) Take 2,000 Int'l Units by mouth daily.    Marland Kitchen docusate sodium (COLACE) 100 MG capsule Take 100 mg by mouth daily.    . DULoxetine (CYMBALTA) 60 MG capsule Take 120 mg by mouth daily.     Marland Kitchen estradiol (ESTRACE) 1 MG tablet Take 0.5 tablets (0.5 mg total) by mouth daily. Take 1/2 tablet po daily 30 tablet 6  . lamoTRIgine (LAMICTAL) 150 MG tablet Take 150 mg by mouth at bedtime.    Marland Kitchen levothyroxine (LEVOTHROID) 25 MCG tablet Take 1 tablet (25 mcg total) by mouth daily. 30 tablet 12  . lidocaine (LIDODERM) 5 % Place 1-3 patches onto the  skin as needed. Remove & Discard patch within 12 hours or as directed by MD    . losartan (COZAAR) 50 MG tablet Take 1 tablet (50 mg total) by mouth daily. 90 tablet 3  . meloxicam (MOBIC) 7.5 MG tablet Take 7.5 mg by mouth 2 (two) times daily.     . Multiple Vitamin (MULTIVITAMIN) tablet Take 1 tablet by mouth daily.      . niacin (SLO-NIACIN) 500 MG tablet Take 500 mg by mouth daily.     Marland Kitchen NITROSTAT 0.4 MG SL tablet PLACE 1 TAB UNDER TONGUE EVERY 5 MIN IF NEEDED FOR CHEST PAIN. MAY USE 3 TIMES.NO RELIEF CALL 911. 25 tablet 0  . Omega-3 Fatty Acids (FISH OIL) 1000 MG CAPS Take 1 capsule by mouth daily.     . polyethylene glycol powder (GLYCOLAX/MIRALAX) powder MIX ONE CAPFUL (17GM) IN 8 OZ. OF FLUID ONCE A DAY AS NEEDED FOR CONSTIPATION. 527 g 11  . Probiotic Product (PROBIOTIC DAILY PO) Take 1 tablet by mouth daily. Florify    . RABEprazole (ACIPHEX) 20 MG tablet Take 1 tablet (20 mg total) by mouth 2 (two) times daily before a meal. 60 tablet 5  . RESTASIS 0.05 % ophthalmic emulsion Place 1 drop into both eyes 2 (two) times daily.     . risperiDONE (RISPERDAL) 1 MG tablet Take 0.5 mg by mouth at bedtime.     . simvastatin (ZOCOR) 20 MG tablet TAKE (1) TABLET BY MOUTH AT BEDTIME. 90 tablet 3  . WELLBUTRIN XL 300 MG 24 hr tablet daily.    Marland Kitchen zolpidem (AMBIEN) 10 MG tablet Take 10 mg by mouth at bedtime as needed for sleep.      No current facility-administered medications for this visit.    Family History  Problem Relation Age of Onset  . Heart disease Mother   . Heart attack Mother   . Heart disease Father   . Heart attack Father   . Stroke Sister   . Stroke Maternal Grandfather   . Cancer Paternal Grandmother     unknown primary  . Colon cancer Neg Hx     ROS:  Pertinent items are noted in HPI.  Otherwise, a comprehensive ROS was negative.  Exam:   BP 118/60 mmHg  Pulse 92  Resp 15  Ht 4' 8.5" (1.435 m)  Wt 137 lb (62.143 kg)  BMI 30.18 kg/m2  LMP 02/10/1993 Height: 4' 8.5"  (143.5 cm) Ht Readings from Last 3 Encounters:  08/04/15 4' 8.5" (1.435 m)  11/26/14 4\' 10"  (1.473 m)  11/23/14 4\' 10"  (1.473 m)    General appearance: alert, cooperative and appears stated age Head: Normocephalic, without obvious abnormality, atraumatic Neck: no adenopathy, supple, symmetrical, trachea midline and thyroid normal  to inspection and palpation Lungs: clear to auscultation bilaterally Breasts: normal appearance, no masses or tenderness, No nipple retraction or dimpling, No nipple discharge or bleeding, No axillary or supraclavicular adenopathy Heart: regular rate and rhythm Abdomen: soft, non-tender; no masses,  no organomegaly Extremities: extremities normal, atraumatic, no cyanosis or edema Skin: Skin color, texture, turgor normal. No rashes or lesions Lymph nodes: Cervical, supraclavicular, and axillary nodes normal. No abnormal inguinal nodes palpated Neurologic: Grossly normal   Pelvic: External genitalia:  no lesions              Urethra:  normal appearing urethra with no masses, tenderness or lesions              Bartholin's and Skene's: normal                 Vagina: normal appearing vagina with normal color and discharge, no lesions              Cervix: absent              Pap taken: No. Bimanual Exam:  Uterus:  uterus absent              Adnexa: no mass, fullness, tenderness and adnexa absent bilateral               Rectovaginal: Confirms               Anus:  normal sphincter tone, no lesions  Chaperone present: yes  A:  Well Woman with normal exam  Menopausal on ERT weaning off now S/P TAH with BSO for DUB  Hypothyroid needs TSH and refill of Rx  Constipation  Anxiety/depression with PCP management  Osteopenia with PCP management, on Vitamin D  Left knee issues now with Orthopedic management     P:   Reviewed health and wellness pertinent to exam  Discussed if any concerns with weaning off to advise.  Lab TSH, has enough medication until lab  results in  Discussed increasing water and fiber in diet and walking as much as she can tolerate to help with constipation. Try Metamucil smooth fiber daily with juice to see if this will resolve issue. Advise if continues to be a problem. Warning signs given and questions addressed.  Continue follow up with MD as indicated  Pap smear as above not taken   counseled on breast self exam, mammography screening, feminine hygiene, adequate intake of calcium and vitamin D, diet and exercise  return annually or prn  An After Visit Summary was printed and given to the patient.

## 2015-08-04 NOTE — Progress Notes (Signed)
Reviewed personally.  M. Suzanne Anan Dapolito, MD.  

## 2015-08-05 ENCOUNTER — Other Ambulatory Visit: Payer: Self-pay | Admitting: Certified Nurse Midwife

## 2015-08-05 DIAGNOSIS — E039 Hypothyroidism, unspecified: Secondary | ICD-10-CM

## 2015-08-05 MED ORDER — LEVOTHYROXINE SODIUM 25 MCG PO TABS: 25.0000 ug | ORAL_TABLET | Freq: Every day | ORAL | Status: DC

## 2015-08-18 ENCOUNTER — Emergency Department (HOSPITAL_COMMUNITY): Payer: Medicare Other

## 2015-08-18 ENCOUNTER — Observation Stay (HOSPITAL_COMMUNITY)
Admission: EM | Admit: 2015-08-18 | Discharge: 2015-08-19 | Disposition: A | Discharge status: Home / Self Care | Payer: Medicare Other | Attending: Cardiology | Admitting: Cardiology

## 2015-08-18 ENCOUNTER — Encounter (HOSPITAL_COMMUNITY): Payer: Self-pay | Admitting: Emergency Medicine

## 2015-08-18 DIAGNOSIS — I10 Essential (primary) hypertension: Secondary | ICD-10-CM | POA: Diagnosis not present

## 2015-08-18 DIAGNOSIS — Z79899 Other long term (current) drug therapy: Secondary | ICD-10-CM | POA: Insufficient documentation

## 2015-08-18 DIAGNOSIS — Z79891 Long term (current) use of opiate analgesic: Secondary | ICD-10-CM | POA: Insufficient documentation

## 2015-08-18 DIAGNOSIS — Z791 Long term (current) use of non-steroidal anti-inflammatories (NSAID): Secondary | ICD-10-CM | POA: Diagnosis not present

## 2015-08-18 DIAGNOSIS — E871 Hypo-osmolality and hyponatremia: Secondary | ICD-10-CM | POA: Insufficient documentation

## 2015-08-18 DIAGNOSIS — R0789 Other chest pain: Secondary | ICD-10-CM

## 2015-08-18 DIAGNOSIS — E785 Hyperlipidemia, unspecified: Secondary | ICD-10-CM | POA: Diagnosis not present

## 2015-08-18 DIAGNOSIS — M797 Fibromyalgia: Secondary | ICD-10-CM | POA: Diagnosis not present

## 2015-08-18 DIAGNOSIS — E039 Hypothyroidism, unspecified: Secondary | ICD-10-CM | POA: Diagnosis not present

## 2015-08-18 DIAGNOSIS — R079 Chest pain, unspecified: Secondary | ICD-10-CM

## 2015-08-18 DIAGNOSIS — Z7982 Long term (current) use of aspirin: Secondary | ICD-10-CM | POA: Insufficient documentation

## 2015-08-18 DIAGNOSIS — F319 Bipolar disorder, unspecified: Secondary | ICD-10-CM | POA: Diagnosis not present

## 2015-08-18 DIAGNOSIS — I25119 Atherosclerotic heart disease of native coronary artery with unspecified angina pectoris: Secondary | ICD-10-CM | POA: Diagnosis not present

## 2015-08-18 DIAGNOSIS — K219 Gastro-esophageal reflux disease without esophagitis: Secondary | ICD-10-CM | POA: Diagnosis not present

## 2015-08-18 DIAGNOSIS — I2510 Atherosclerotic heart disease of native coronary artery without angina pectoris: Secondary | ICD-10-CM | POA: Diagnosis present

## 2015-08-18 HISTORY — DX: Other chronic pain: G89.29

## 2015-08-18 HISTORY — DX: Other chronic pain: M54.9

## 2015-08-18 HISTORY — DX: Sciatica, unspecified side: M54.30

## 2015-08-18 HISTORY — DX: Pain in unspecified knee: M25.569

## 2015-08-18 LAB — CBC
HCT: 37.3 % (ref 36.0–46.0)
Hemoglobin: 12.1 g/dL (ref 12.0–15.0)
MCH: 31 pg (ref 26.0–34.0)
MCHC: 32.4 g/dL (ref 30.0–36.0)
MCV: 95.6 fL (ref 78.0–100.0)
Platelets: 304 10*3/uL (ref 150–400)
RBC: 3.9 MIL/uL (ref 3.87–5.11)
RDW: 14.4 % (ref 11.5–15.5)
WBC: 7.8 10*3/uL (ref 4.0–10.5)

## 2015-08-18 LAB — BASIC METABOLIC PANEL
Anion gap: 10 (ref 5–15)
BUN: 9 mg/dL (ref 6–20)
CO2: 26 mmol/L (ref 22–32)
Calcium: 8.6 mg/dL — ABNORMAL LOW (ref 8.9–10.3)
Chloride: 96 mmol/L — ABNORMAL LOW (ref 101–111)
Creatinine, Ser: 0.96 mg/dL (ref 0.44–1.00)
GFR calc Af Amer: >60 mL/min (ref >60)
GFR calc non Af Amer: 58 mL/min — ABNORMAL LOW (ref >60)
Glucose, Bld: 102 mg/dL — ABNORMAL HIGH (ref 65–99)
Potassium: 4 mmol/L (ref 3.5–5.1)
Sodium: 132 mmol/L — ABNORMAL LOW (ref 135–145)

## 2015-08-18 LAB — TROPONIN I
Troponin I: <0.03 ng/mL (ref <0.031)
Troponin I: <0.03 ng/mL (ref <0.031)
Troponin I: <0.03 ng/mL (ref <0.031)

## 2015-08-18 MED ORDER — NIACIN ER 500 MG PO TBCR
500.0000 mg | EXTENDED_RELEASE_TABLET | Freq: Every day | ORAL | Status: DC
  Administered 2015-08-18: 500 mg via ORAL
  Filled 2015-08-18 (×4): qty 1

## 2015-08-18 MED ORDER — MELOXICAM 7.5 MG PO TABS
7.5000 mg | ORAL_TABLET | Freq: Two times a day (BID) | ORAL | Status: DC
  Administered 2015-08-18 – 2015-08-19 (×2): 7.5 mg via ORAL
  Filled 2015-08-18 (×8): qty 1

## 2015-08-18 MED ORDER — DULOXETINE HCL 60 MG PO CPEP
120.0000 mg | ORAL_CAPSULE | Freq: Every day | ORAL | Status: DC
  Administered 2015-08-19: 120 mg via ORAL
  Filled 2015-08-18: qty 2

## 2015-08-18 MED ORDER — MORPHINE SULFATE (PF) 2 MG/ML IV SOLN: 2.0000 mg | INTRAVENOUS | Status: DC | PRN

## 2015-08-18 MED ORDER — MELOXICAM 7.5 MG PO TABS
ORAL_TABLET | ORAL | Status: AC
  Filled 2015-08-18: qty 1

## 2015-08-18 MED ORDER — NITROGLYCERIN 0.4 MG SL SUBL: 0.4000 mg | SUBLINGUAL_TABLET | SUBLINGUAL | Status: DC | PRN

## 2015-08-18 MED ORDER — ASPIRIN 81 MG PO CHEW
81.0000 mg | CHEWABLE_TABLET | Freq: Every day | ORAL | Status: DC
  Administered 2015-08-18 – 2015-08-19 (×2): 81 mg via ORAL
  Filled 2015-08-18 (×2): qty 1

## 2015-08-18 MED ORDER — LEVOTHYROXINE SODIUM 50 MCG PO TABS
25.0000 ug | ORAL_TABLET | Freq: Every day | ORAL | Status: DC
  Administered 2015-08-19: 25 ug via ORAL
  Filled 2015-08-18: qty 1

## 2015-08-18 MED ORDER — RISPERIDONE 0.5 MG PO TABS
0.5000 mg | ORAL_TABLET | Freq: Every day | ORAL | Status: DC
  Administered 2015-08-18: 0.5 mg via ORAL
  Filled 2015-08-18 (×2): qty 1

## 2015-08-18 MED ORDER — ASPIRIN 81 MG PO CHEW
324.0000 mg | CHEWABLE_TABLET | Freq: Once | ORAL | Status: AC
  Administered 2015-08-18: 324 mg via ORAL
  Filled 2015-08-18: qty 4

## 2015-08-18 MED ORDER — CYCLOSPORINE 0.05 % OP EMUL
OPHTHALMIC | Status: AC
  Filled 2015-08-18: qty 1

## 2015-08-18 MED ORDER — DOCUSATE SODIUM 100 MG PO CAPS: 100.0000 mg | ORAL_CAPSULE | Freq: Every day | ORAL | Status: DC | PRN

## 2015-08-18 MED ORDER — ONDANSETRON HCL 4 MG/2ML IJ SOLN: 4.0000 mg | Freq: Four times a day (QID) | INTRAMUSCULAR | Status: DC | PRN

## 2015-08-18 MED ORDER — PANTOPRAZOLE SODIUM 40 MG PO TBEC
40.0000 mg | DELAYED_RELEASE_TABLET | Freq: Every day | ORAL | Status: DC
Start: 2015-08-19 — End: 2015-08-20
  Administered 2015-08-19: 40 mg via ORAL
  Filled 2015-08-18: qty 1

## 2015-08-18 MED ORDER — LIDOCAINE 5 % EX PTCH
1.0000 | MEDICATED_PATCH | Freq: Every day | CUTANEOUS | Status: DC | PRN
  Filled 2015-08-18: qty 3

## 2015-08-18 MED ORDER — VITAMIN D 1000 UNITS PO TABS
1000.0000 [IU] | ORAL_TABLET | Freq: Every day | ORAL | Status: DC
  Administered 2015-08-19: 1000 [IU] via ORAL
  Filled 2015-08-18: qty 1

## 2015-08-18 MED ORDER — LAMOTRIGINE 150 MG PO TABS
150.0000 mg | ORAL_TABLET | Freq: Every day | ORAL | Status: DC
  Administered 2015-08-18: 150 mg via ORAL
  Filled 2015-08-18: qty 2
  Filled 2015-08-18: qty 1

## 2015-08-18 MED ORDER — ADULT MULTIVITAMIN W/MINERALS CH
1.0000 | ORAL_TABLET | Freq: Every day | ORAL | Status: DC
  Administered 2015-08-19: 1 via ORAL
  Filled 2015-08-18: qty 1

## 2015-08-18 MED ORDER — NIACIN ER 250 MG PO CPCR
ORAL_CAPSULE | ORAL | Status: AC
  Filled 2015-08-18: qty 2

## 2015-08-18 MED ORDER — BUPROPION HCL ER (XL) 150 MG PO TB24
300.0000 mg | ORAL_TABLET | Freq: Every day | ORAL | Status: DC
  Administered 2015-08-19: 300 mg via ORAL
  Filled 2015-08-18 (×4): qty 2

## 2015-08-18 MED ORDER — CYCLOSPORINE 0.05 % OP EMUL
1.0000 [drp] | Freq: Two times a day (BID) | OPHTHALMIC | Status: DC
  Administered 2015-08-18 – 2015-08-19 (×2): 1 [drp] via OPHTHALMIC
  Filled 2015-08-18 (×8): qty 1

## 2015-08-18 MED ORDER — ACETAMINOPHEN 325 MG PO TABS: 650.0000 mg | ORAL_TABLET | ORAL | Status: DC | PRN

## 2015-08-18 MED ORDER — ESTRADIOL 1 MG PO TABS
0.5000 mg | ORAL_TABLET | Freq: Every day | ORAL | Status: DC
  Filled 2015-08-18 (×4): qty 0.5

## 2015-08-18 MED ORDER — SODIUM CHLORIDE 0.9 % IV SOLN
INTRAVENOUS | Status: DC
  Administered 2015-08-18: 17:00:00 via INTRAVENOUS

## 2015-08-18 MED ORDER — SIMVASTATIN 20 MG PO TABS
20.0000 mg | ORAL_TABLET | Freq: Every day | ORAL | Status: DC
  Administered 2015-08-18 – 2015-08-19 (×2): 20 mg via ORAL
  Filled 2015-08-18 (×2): qty 1

## 2015-08-18 MED ORDER — OMEGA-3-ACID ETHYL ESTERS 1 G PO CAPS
1.0000 | ORAL_CAPSULE | Freq: Every day | ORAL | Status: DC
  Administered 2015-08-19: 1 g via ORAL
  Filled 2015-08-18 (×2): qty 1

## 2015-08-18 MED ORDER — ONDANSETRON HCL 4 MG/2ML IJ SOLN
4.0000 mg | INTRAMUSCULAR | Status: DC | PRN
  Administered 2015-08-18: 4 mg via INTRAVENOUS
  Filled 2015-08-18: qty 2

## 2015-08-18 MED ORDER — ENOXAPARIN SODIUM 40 MG/0.4ML ~~LOC~~ SOLN
40.0000 mg | SUBCUTANEOUS | Status: DC
  Administered 2015-08-18: 40 mg via SUBCUTANEOUS
  Filled 2015-08-18: qty 0.4

## 2015-08-18 MED ORDER — RISAQUAD PO CAPS
1.0000 | ORAL_CAPSULE | Freq: Every morning | ORAL | Status: DC
  Administered 2015-08-19: 1 via ORAL
  Filled 2015-08-18: qty 1

## 2015-08-18 MED ORDER — ZOLPIDEM TARTRATE 5 MG PO TABS: 5.0000 mg | ORAL_TABLET | Freq: Every evening | ORAL | Status: DC | PRN

## 2015-08-18 MED ORDER — NITROGLYCERIN 0.4 MG SL SUBL
0.4000 mg | SUBLINGUAL_TABLET | SUBLINGUAL | Status: DC | PRN
Start: 2015-08-18 — End: 2015-08-20

## 2015-08-18 MED ORDER — LOSARTAN POTASSIUM 50 MG PO TABS
50.0000 mg | ORAL_TABLET | Freq: Every day | ORAL | Status: DC
  Administered 2015-08-19: 50 mg via ORAL
  Filled 2015-08-18: qty 1

## 2015-08-18 MED ORDER — ALPRAZOLAM 0.5 MG PO TABS: 0.5000 mg | ORAL_TABLET | Freq: Every day | ORAL | Status: DC | PRN

## 2015-08-18 MED ORDER — OXYCODONE HCL 5 MG PO TABS
5.0000 mg | ORAL_TABLET | ORAL | Status: DC | PRN
  Administered 2015-08-18: 5 mg via ORAL
  Filled 2015-08-18: qty 1

## 2015-08-18 MED ORDER — MORPHINE SULFATE (PF) 4 MG/ML IV SOLN
4.0000 mg | INTRAVENOUS | Status: DC | PRN
  Administered 2015-08-18: 4 mg via INTRAVENOUS
  Filled 2015-08-18: qty 1

## 2015-08-18 NOTE — ED Notes (Signed)
O2 @2L  [place] applied. Pain decreased to 5 after Morphine at this time.

## 2015-08-18 NOTE — ED Notes (Signed)
Pt reports chest pain for last several hours. Pt reports took x3 nitro prior to arrival with minimal relief. Pt reports pain is worse with a deep breath.

## 2015-08-18 NOTE — ED Provider Notes (Signed)
CSN: [id number]     Arrival date & time 08/18/15  1645 History   First MD Initiated Contact with Patient 08/18/15 1653     Chief Complaint  Patient presents with  . Chest Pain      HPI Pt was seen at 1655. Per pt and her family, c/o gradual onset and persistence of constant chest "pain" since approximately 1545 PTA. Pt states she was at home packing her clothes to go on a trip when she began to feel nauseated and diaphoretic, followed by CP. Describes the CP as "dull," located in her left chest and radiating into the left side of her neck, left arm, and left back. Pt also describes her CP as her "heart pain." Pt initially laid down to rest without change in her symptoms, then took her own SL ntg x3 without relief. Pt did not take ASA today. Pt called her PMD and was told to come to the ED for evaluation. Denies palpitations, no SOB/cough, no abd pain, no vomiting/diarrhea, no fevers, no rash.     Cards: Dr. Harrington Challenger Past Medical History  Diagnosis Date  . CAD (coronary artery disease)   . Chest pain   . Dizziness   . Dyslipidemia   . GERD (gastroesophageal reflux disease)   . Bipolar affective disorder ([place])   . Fibromyalgia   . H/O: hysterectomy   . Hyperlipidemia   . Arthritis   . Chronic fatigue   . Hypertension   . Hx of adenomatous colonic polyps 2005  . Osteopenia   . Chronic back pain   . Sciatica   . Knee pain    Past Surgical History  Procedure Laterality Date  . Colonoscopy  01/29/2007    FAO:ZHYQMV rectum, left-sided diverticula, 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  Remainder of colonic mucosa was normal  . [place] bunionectomy      bilateral  . Esophagogastroduodenoscopy      followed by colonoscopy with snare polypectomy  . Cystectomy      pilonidial cyst  . Breast lumpectomy    . Spinal fusion    . Right knee replacement  02/2013  . Breast biopsy Left 1989  . Shoulder arthroscopy Left 2005  . Bunionectomy Left 2005  . Spinal fusion  2004     L4-L5  . Total hip arthroplasty Right 06/2006  . Total shoulder replacement Left 08/2005  . Total hip arthroplasty  8/12  . Abdominal hysterectomy  1994    TAH- DUB, BSO  . Shoulder arthroscopy Right   . Colonoscopy with esophagogastroduodenoscopy (egd) N/A 02/01/2014    HQI:ONGE erosive reflux/gastric polyp/normal rectum/scattered left sided diverticula, normal distal TI. gastric bx negative, fundic gland polyp. next TCS 01/2019.  Marland Kitchen Cardiac catheterization  2010    +CAD   Family History  Problem Relation Age of Onset  . Heart disease Mother   . Heart attack Mother   . Heart disease Father   . Heart attack Father   . Stroke Sister   . Stroke Maternal Grandfather   . Cancer Paternal Grandmother     unknown primary  . Colon cancer Neg Hx    Social History  Substance Use Topics  . Smoking status: Never Smoker   . Smokeless tobacco: Never Used  . Alcohol Use: 3.5 oz/week    7 Standard drinks or equivalent per week   OB History    Gravida Para Term Preterm AB TAB SAB Ectopic Multiple Living   2 2 2  2     Review of Systems ROS: Statement: All systems negative except as marked or noted in the HPI; Constitutional: Negative for fever and chills. ; ; Eyes: Negative for eye pain, redness and discharge. ; ; ENMT: Negative for ear pain, hoarseness, nasal congestion, sinus pressure and sore throat. ; ; Cardiovascular: +CP, diaphoresis. Negative for palpitations, dyspnea and peripheral edema. ; ; Respiratory: Negative for cough, wheezing and stridor. ; ; Gastrointestinal: +nausea. Negative for vomiting, diarrhea, abdominal pain, blood in stool, hematemesis, jaundice and rectal bleeding. . ; ; Genitourinary: Negative for dysuria, flank pain and hematuria. ; ; Musculoskeletal: Negative for back pain and neck pain. Negative for swelling and trauma.; ; Skin: Negative for pruritus, rash, abrasions, blisters, bruising and skin lesion.; ; Neuro: Negative for headache, lightheadedness and neck  stiffness. Negative for weakness, altered level of consciousness , altered mental status, extremity weakness, paresthesias, involuntary movement, seizure and syncope.      Allergies  Hydrocodone-acetaminophen and Vicodin  Home Medications   Prior to Admission medications   Medication Sig Start Date End Date Taking? Authorizing Provider  ALPRAZolam Duanne Moron) 0.5 MG tablet Take 1 tablet by mouth daily as needed. anxiety 12/15/13   Historical Provider, MD  aspirin 81 MG tablet Take 81 mg by mouth daily.    Historical Provider, MD  Cholecalciferol (VITAMIN D PO) Take 2,000 Int'l Units by mouth daily.    Historical Provider, MD  docusate sodium (COLACE) 100 MG capsule Take 100 mg by mouth daily.    Historical Provider, MD  DULoxetine (CYMBALTA) 60 MG capsule Take 120 mg by mouth daily.     Historical Provider, MD  estradiol (ESTRACE) 1 MG tablet Take 0.5 tablets (0.5 mg total) by mouth daily. Take 1/2 tablet po daily 07/27/13   Regina Eck, CNM  lamoTRIgine (LAMICTAL) 150 MG tablet Take 150 mg by mouth at bedtime.    Historical Provider, MD  levothyroxine (LEVOTHROID) 25 MCG tablet Take 1 tablet (25 mcg total) by mouth daily. 08/05/15   Regina Eck, CNM  lidocaine (LIDODERM) 5 % Place 1-3 patches onto the skin as needed. Remove & Discard patch within 12 hours or as directed by MD    Historical Provider, MD  losartan (COZAAR) 50 MG tablet Take 1 tablet (50 mg total) by mouth daily. 11/26/14   Fay Records, MD  meloxicam (MOBIC) 7.5 MG tablet Take 7.5 mg by mouth 2 (two) times daily.     Historical Provider, MD  Multiple Vitamin (MULTIVITAMIN) tablet Take 1 tablet by mouth daily.      Historical Provider, MD  niacin (SLO-NIACIN) 500 MG tablet Take 500 mg by mouth daily.     Historical Provider, MD  NITROSTAT 0.4 MG SL tablet PLACE 1 TAB UNDER TONGUE EVERY 5 MIN IF NEEDED FOR CHEST PAIN. MAY USE 3 TIMES.NO RELIEF CALL 911. 07/09/14   Burnell Blanks, MD  Omega-3 Fatty Acids (FISH OIL)  1000 MG CAPS Take 1 capsule by mouth daily.     Historical Provider, MD  oxyCODONE (OXY IR/ROXICODONE) 5 MG immediate release tablet Take 5 mg by mouth every 4 (four) hours as needed for severe pain.    Historical Provider, MD  polyethylene glycol powder (GLYCOLAX/MIRALAX) powder MIX ONE CAPFUL (17GM) IN 8 OZ. OF FLUID ONCE A DAY AS NEEDED FOR CONSTIPATION. 02/07/15   Carlis Stable, NP  Probiotic Product (PROBIOTIC DAILY PO) Take 1 tablet by mouth daily. Florify    Historical Provider, MD  RABEprazole (ACIPHEX) 20  MG tablet Take 1 tablet (20 mg total) by mouth 2 (two) times daily before a meal. 11/23/14   Mahala Menghini, PA-C  RESTASIS 0.05 % ophthalmic emulsion Place 1 drop into both eyes 2 (two) times daily.  06/24/13   Historical Provider, MD  risperiDONE (RISPERDAL) 1 MG tablet Take 0.5 mg by mouth at bedtime.     Historical Provider, MD  simvastatin (ZOCOR) 20 MG tablet TAKE (1) TABLET BY MOUTH AT BEDTIME. 11/26/14   Fay Records, MD  [facility] XL 300 MG 24 hr tablet daily. 07/08/14   Historical Provider, MD  zolpidem (AMBIEN) 10 MG tablet Take 10 mg by mouth at bedtime as needed for sleep.  06/09/13   Historical Provider, MD   BP 135/75 mmHg  Pulse 96  Temp(Src) 97.9 F (36.6 C) (Oral)  Resp 14  Ht 5' (1.524 m)  Wt 137 lb (62.143 kg)  BMI 26.76 kg/m2  SpO2 99%  LMP 02/10/1993 Physical Exam  1700: Physical examination:  Nursing notes reviewed; Vital signs and O2 SAT reviewed;  Constitutional: Well developed, Well nourished, Well hydrated, In no acute distress; Head:  Normocephalic, atraumatic; Eyes: EOMI, PERRL, No scleral icterus; ENMT: Mouth and pharynx normal, Mucous membranes moist; Neck: Supple, Full range of motion, No lymphadenopathy; Cardiovascular: Regular rate and rhythm, No gallop; Respiratory: Breath sounds clear & equal bilaterally, No wheezes.  Speaking full sentences with ease, Normal respiratory effort/excursion; Chest: Nontender, Movement normal; Abdomen: Soft, Nontender,  Nondistended, Normal bowel sounds; Genitourinary: No CVA tenderness; Extremities: Pulses normal, No tenderness, No edema, No calf edema or asymmetry.; Neuro: AA&Ox3, Major CN grossly intact.  Speech clear. No gross focal motor or sensory deficits in extremities.; Skin: Color normal, Warm, Dry.    ED Course  Procedures (including critical care time) Labs Review   Imaging Review  I have personally reviewed and evaluated these images and lab results as part of my medical decision-making.   EKG Interpretation   Date/Time:  Thursday August 18 2015 16:52:31 EDT Ventricular Rate:  98 PR Interval:  146 QRS Duration: 101 QT Interval:  358 QTC Calculation: 457 R Axis:   93 Text Interpretation:  Sinus rhythm Borderline T abnormalities, anterior  leads Baseline wander When compared with ECG of 11/29/2012 T wave  abnormality Anterior leads is now Present Confirmed by [facility]  MD,  Nunzio Cory [phone]) on 08/18/2015 5:05:35 PM      MDM  MDM Reviewed: previous chart, nursing note and vitals Reviewed previous: labs and ECG Interpretation: labs, ECG and x-ray     Results for orders placed or performed during the hospital encounter of 60/63/01  Basic metabolic panel  Result Value Ref Range   Sodium 132 (L) 135 - 145 mmol/L   Potassium 4.0 3.5 - 5.1 mmol/L   Chloride 96 (L) 101 - 111 mmol/L   CO2 26 22 - 32 mmol/L   Glucose, Bld 102 (H) 65 - 99 mg/dL   BUN 9 6 - 20 mg/dL   Creatinine, Ser 0.96 0.44 - 1.00 mg/dL   Calcium 8.6 (L) 8.9 - 10.3 mg/dL   GFR calc non Af Amer 58 (L) >60 mL/min   GFR calc Af Amer >60 >60 mL/min   Anion gap 10 5 - 15  CBC  Result Value Ref Range   WBC 7.8 4.0 - 10.5 K/uL   RBC 3.90 3.87 - 5.11 MIL/uL   Hemoglobin 12.1 12.0 - 15.0 g/dL   HCT 37.3 36.0 - 46.0 %   MCV 95.6 78.0 -  100.0 fL   MCH 31.0 26.0 - 34.0 pg   MCHC 32.4 30.0 - 36.0 g/dL   RDW 14.4 11.5 - 15.5 %   Platelets 304 150 - 400 K/uL  Troponin I  Result Value Ref Range   Troponin I <0.03  <0.031 ng/mL   Dg Chest Port 1 View 08/18/2015   CLINICAL DATA:  Left-sided chest pain radiating down the left arm. Shortness of breath, nausea  EXAM: PORTABLE CHEST 1 VIEW  COMPARISON:  11/28/2012  FINDINGS: There is no focal parenchymal opacity. There is no pleural effusion or pneumothorax. The heart and mediastinal contours are unremarkable.  There is severe osteoarthritis of the right glenohumeral joint. There is a left shoulder arthroplasty.  IMPRESSION: No active disease.   Electronically Signed   By: Kathreen Devoid   On: 08/18/2015 17:29     1800:  Pt feels improved after ASA and IV morphine. Dx and testing d/w pt and family.  Questions answered.  Verb understanding, agreeable to admit. T/C to [facility] Dr. Anastasio Champion, case discussed, including:  HPI, pertinent PM/SHx, VS/PE, dx testing, ED course and treatment:  Agreeable to admit, requests to write temporary orders, obtain observation tele bed to Dr. Luan Pulling' service.   Francine Graven, DO 08/22/15 [phone]

## 2015-08-18 NOTE — ED Notes (Signed)
Called unit 300 and spoke with Inez Catalina, NS and asked for nurse to call for report once she got out of shift change reporting

## 2015-08-18 NOTE — H&P (Signed)
[facility] History and Physical  [name] [name] XBD:[id number] DOB: [birth year] DOA: 08/18/2015  Referring physician: ER PCP: Alonza Bogus, MD   Chief Complaint: Chest pain  HPI: [name] [name] is a 74 y.o. female  This is a pleasant 74 year old lady who has a history today of sudden onset of chest pain at approximately 4 PM while she was packing her bags to go out of town. The pain was associated with sweating and nausea and radiated to the left arm and neck area. It lasted until she came to the emergency room, she is not clear how long this was. She took some nitroglycerin at home but this did not relieve the pain. She came to the hospital was given intravenous morphine and her pain seems to have resolved now. She says that the quality of the pain is very similar to pain she has had before reflective of her coronary artery disease that she is known to have. Currently she is pain-free and initial cardiac enzymes are negative. She is now going to be admitted for further investigation.   Review of Systems:  Apart from symptoms above, all systems are negative.  Past Medical History  Diagnosis Date  . CAD (coronary artery disease)   . Chest pain   . Dizziness   . Dyslipidemia   . GERD (gastroesophageal reflux disease)   . Bipolar affective disorder ([place])   . Fibromyalgia   . H/O: hysterectomy   . Hyperlipidemia   . Arthritis   . Chronic fatigue   . Hypertension   . Hx of adenomatous colonic polyps 2005  . Osteopenia   . Chronic back pain   . Sciatica   . Knee pain    Past Surgical History  Procedure Laterality Date  . Colonoscopy  01/29/2007    STM:[id number] rectum, left-sided diverticula, 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  Remainder of colonic mucosa was normal  . [place] bunionectomy      bilateral  . Esophagogastroduodenoscopy      followed by colonoscopy with snare polypectomy  . Cystectomy      pilonidial cyst  . Breast lumpectomy    .  Spinal fusion    . Right knee replacement  02/2013  . Breast biopsy Left 1989  . Shoulder arthroscopy Left 2005  . Bunionectomy Left 2005  . Spinal fusion  2004    L4-L5  . Total hip arthroplasty Right 06/2006  . Total shoulder replacement Left 08/2005  . Total hip arthroplasty  8/12  . Abdominal hysterectomy  1994    TAH- DUB, BSO  . Shoulder arthroscopy Right   . Colonoscopy with esophagogastroduodenoscopy (egd) N/A 02/01/2014    LNL:GXQJ erosive reflux/gastric polyp/normal rectum/scattered left sided diverticula, normal distal TI. gastric bx negative, fundic gland polyp. next TCS 01/2019.  Marland Kitchen Cardiac catheterization  2010    +CAD   Social History:  reports that she has never smoked. She has never used smokeless tobacco. She reports that she drinks about 3.5 oz of alcohol per week. She reports that she does not use illicit drugs.  Allergies  Allergen Reactions  . Hydrocodone-Acetaminophen Hives    REACTION: urticaria (hives)  . Vicodin [Hydrocodone-Acetaminophen] Itching    Family History  Problem Relation Age of Onset  . Heart disease Mother   . Heart attack Mother   . Heart disease Father   . Heart attack Father   . Stroke Sister   . Stroke Maternal Grandfather   . Cancer Paternal Grandmother  unknown primary  . Colon cancer Neg Hx     Prior to Admission medications   Medication Sig Start Date End Date Taking? Authorizing Provider  ALPRAZolam Duanne Moron) 0.5 MG tablet Take 1 tablet by mouth daily as needed. anxiety 12/15/13  Yes Historical Provider, MD  aspirin 81 MG tablet Take 81 mg by mouth daily.   Yes Historical Provider, MD  Cholecalciferol (VITAMIN D PO) Take 2,000 Int'l Units by mouth daily.   Yes Historical Provider, MD  docusate sodium (COLACE) 100 MG capsule Take 100 mg by mouth daily as needed for mild constipation or moderate constipation.    Yes Historical Provider, MD  DULoxetine (CYMBALTA) 60 MG capsule Take 120 mg by mouth daily.    Yes Historical Provider,  MD  estradiol (ESTRACE) 1 MG tablet Take 0.5 tablets (0.5 mg total) by mouth daily. Take 1/2 tablet po daily 07/27/13  Yes Regina Eck, CNM  lamoTRIgine (LAMICTAL) 150 MG tablet Take 150 mg by mouth at bedtime.   Yes Historical Provider, MD  levothyroxine (LEVOTHROID) 25 MCG tablet Take 1 tablet (25 mcg total) by mouth daily. 08/05/15  Yes Regina Eck, CNM  lidocaine (LIDODERM) 5 % Place 1-3 patches onto the skin as needed. Remove & Discard patch within 12 hours or as directed by MD   Yes Historical Provider, MD  losartan (COZAAR) 50 MG tablet Take 1 tablet (50 mg total) by mouth daily. 11/26/14  Yes Fay Records, MD  meloxicam (MOBIC) 7.5 MG tablet Take 7.5 mg by mouth 2 (two) times daily.    Yes Historical Provider, MD  Multiple Vitamin (MULTIVITAMIN) tablet Take 1 tablet by mouth daily.     Yes Historical Provider, MD  niacin (SLO-NIACIN) 500 MG tablet Take 500 mg by mouth daily.    Yes Historical Provider, MD  NITROSTAT 0.4 MG SL tablet PLACE 1 TAB UNDER TONGUE EVERY 5 MIN IF NEEDED FOR CHEST PAIN. MAY USE 3 TIMES.NO RELIEF CALL 911. 07/09/14  Yes Burnell Blanks, MD  Omega-3 Fatty Acids (FISH OIL) 1000 MG CAPS Take 1 capsule by mouth daily.    Yes Historical Provider, MD  oxyCODONE (OXY IR/ROXICODONE) 5 MG immediate release tablet Take 5 mg by mouth every 4 (four) hours as needed for severe pain.   Yes Historical Provider, MD  polyethylene glycol powder (GLYCOLAX/MIRALAX) powder MIX ONE CAPFUL (17GM) IN 8 OZ. OF FLUID ONCE A DAY AS NEEDED FOR CONSTIPATION. 02/07/15  Yes Carlis Stable, NP  Probiotic Product (PROBIOTIC DAILY PO) Take 1 tablet by mouth every morning. Florify   Yes Historical Provider, MD  RABEprazole (ACIPHEX) 20 MG tablet Take 1 tablet (20 mg total) by mouth 2 (two) times daily before a meal. Patient taking differently: Take 20 mg by mouth daily.  11/23/14  Yes Mahala Menghini, PA-C  RESTASIS 0.05 % ophthalmic emulsion Place 1 drop into both eyes 2 (two) times daily.   06/24/13  Yes Historical Provider, MD  risperiDONE (RISPERDAL) 1 MG tablet Take 0.5 mg by mouth at bedtime.    Yes Historical Provider, MD  simvastatin (ZOCOR) 20 MG tablet TAKE (1) TABLET BY MOUTH AT BEDTIME. 11/26/14  Yes Fay Records, MD  [facility] XL 300 MG 24 hr tablet Take 300 mg by mouth daily.  07/08/14  Yes Historical Provider, MD  zolpidem (AMBIEN) 10 MG tablet Take 10 mg by mouth at bedtime as needed for sleep.  06/09/13  Yes Historical Provider, MD   Physical Exam: Filed Vitals:  08/18/15 1730 08/18/15 1850 08/18/15 1900 08/18/15 1938  BP: 122/69  118/77 133/77  Pulse: 89 100 101 97  Temp:    97.8 F (36.6 C)  TempSrc:    Oral  Resp: 11 12 13 17   Height:    4\' 9"  (1.448 m)  Weight:    61.916 kg (136 lb 8 oz)  SpO2: 100% 98% 98% 98%    Wt Readings from Last 3 Encounters:  08/18/15 61.916 kg (136 lb 8 oz)  08/04/15 62.143 kg (137 lb)  11/26/14 62.959 kg (138 lb 12.8 oz)    General:  Appears calm and comfortable. She does not appear to be in pain. Eyes: PERRL, normal lids, irises & conjunctiva ENT: grossly normal hearing, lips & tongue Neck: no LAD, masses or thyromegaly Cardiovascular: RRR, no m/r/g. No LE edema. Telemetry: SR, no arrhythmias  Respiratory: CTA bilaterally, no w/r/r. Normal respiratory effort. Abdomen: soft, ntnd Skin: no rash or induration seen on limited exam Musculoskeletal: grossly normal tone BUE/BLE Psychiatric: grossly normal mood and affect, speech fluent and appropriate Neurologic: grossly non-focal.          Labs on Admission:  Basic Metabolic Panel:  Recent Labs Lab 08/18/15 1651  NA 132*  K 4.0  CL 96*  CO2 26  GLUCOSE 102*  BUN 9  CREATININE 0.96  CALCIUM 8.6*   Liver Function Tests: No results for input(s): AST, ALT, ALKPHOS, BILITOT, PROT, ALBUMIN in the last 168 hours. No results for input(s): LIPASE, AMYLASE in the last 168 hours. No results for input(s): AMMONIA in the last 168 hours. CBC:  Recent Labs Lab  08/18/15 1651  WBC 7.8  HGB 12.1  HCT 37.3  MCV 95.6  PLT 304   Cardiac Enzymes:  Recent Labs Lab 08/18/15 1651  TROPONINI <0.03    BNP (last 3 results) No results for input(s): BNP in the last 8760 hours.  ProBNP (last 3 results) No results for input(s): PROBNP in the last 8760 hours.  CBG: No results for input(s): GLUCAP in the last 168 hours.  Radiological Exams on Admission: Dg Chest Port 1 View  08/18/2015   CLINICAL DATA:  Left-sided chest pain radiating down the left arm. Shortness of breath, nausea  EXAM: PORTABLE CHEST 1 VIEW  COMPARISON:  11/28/2012  FINDINGS: There is no focal parenchymal opacity. There is no pleural effusion or pneumothorax. The heart and mediastinal contours are unremarkable.  There is severe osteoarthritis of the right glenohumeral joint. There is a left shoulder arthroplasty.  IMPRESSION: No active disease.   Electronically Signed   By: Kathreen Devoid   On: 08/18/2015 17:29    EKG: Independently reviewed. Sinus rhythm without any acute ST-T wave changes.  Assessment/Plan   1. Chest pain. This appears to be cardiac in nature. Serial cardiac enzymes. Cardiology consultation in the morning. She may require stress testing. 2. Hypertension. Stable. Continue home medications. 3. Fibromyalgia. It is conceivable that part of the pain is related to her fibromyalgia but the pain is more convincing for cardiac.  She'll be admitted under observation to telemetry. Further recommendations will depend on patient's hospital progress.   Code Status: Full code.   DVT Prophylaxis: Lovenox.  Family Communication: I discussed the plan with the patient at the bedside.  Disposition Plan: Home when medically stable.   Time spent: 45 minutes.  Doree Albee [facility] Pager [phone].

## 2015-08-19 ENCOUNTER — Observation Stay (HOSPITAL_BASED_OUTPATIENT_CLINIC_OR_DEPARTMENT_OTHER): Payer: Medicare Other

## 2015-08-19 ENCOUNTER — Encounter (HOSPITAL_COMMUNITY): Payer: Self-pay | Admitting: Nurse Practitioner

## 2015-08-19 ENCOUNTER — Encounter (HOSPITAL_COMMUNITY)
Admission: EM | Disposition: A | Discharge status: Home / Self Care | Payer: Self-pay | Source: Home / Self Care | Attending: Emergency Medicine

## 2015-08-19 DIAGNOSIS — R0789 Other chest pain: Secondary | ICD-10-CM

## 2015-08-19 DIAGNOSIS — R079 Chest pain, unspecified: Secondary | ICD-10-CM | POA: Diagnosis not present

## 2015-08-19 DIAGNOSIS — E785 Hyperlipidemia, unspecified: Secondary | ICD-10-CM

## 2015-08-19 DIAGNOSIS — F319 Bipolar disorder, unspecified: Secondary | ICD-10-CM | POA: Diagnosis present

## 2015-08-19 DIAGNOSIS — I25110 Atherosclerotic heart disease of native coronary artery with unstable angina pectoris: Secondary | ICD-10-CM

## 2015-08-19 DIAGNOSIS — I200 Unstable angina: Secondary | ICD-10-CM

## 2015-08-19 DIAGNOSIS — I10 Essential (primary) hypertension: Secondary | ICD-10-CM

## 2015-08-19 DIAGNOSIS — K219 Gastro-esophageal reflux disease without esophagitis: Secondary | ICD-10-CM | POA: Diagnosis not present

## 2015-08-19 DIAGNOSIS — M797 Fibromyalgia: Secondary | ICD-10-CM

## 2015-08-19 DIAGNOSIS — I25119 Atherosclerotic heart disease of native coronary artery with unspecified angina pectoris: Secondary | ICD-10-CM | POA: Diagnosis not present

## 2015-08-19 HISTORY — DX: Other chest pain: R07.89

## 2015-08-19 HISTORY — PX: CARDIAC CATHETERIZATION: SHX172

## 2015-08-19 LAB — PROTIME-INR
INR: 1 (ref 0.00–1.49)
Prothrombin Time: 13.4 seconds (ref 11.6–15.2)

## 2015-08-19 LAB — TROPONIN I: Troponin I: <0.03 ng/mL (ref <0.031)

## 2015-08-19 SURGERY — LEFT HEART CATH AND CORONARY ANGIOGRAPHY
Anesthesia: LOCAL

## 2015-08-19 MED ORDER — ACETAMINOPHEN 325 MG PO TABS
650.0000 mg | ORAL_TABLET | ORAL | Status: DC | PRN
  Administered 2015-08-19: 650 mg via ORAL
  Filled 2015-08-19: qty 2

## 2015-08-19 MED ORDER — SODIUM CHLORIDE 0.9 % WEIGHT BASED INFUSION: 3.0000 mL/kg/h | INTRAVENOUS | Status: DC

## 2015-08-19 MED ORDER — VERAPAMIL HCL 2.5 MG/ML IV SOLN
INTRAVENOUS | Status: AC
  Filled 2015-08-19: qty 2

## 2015-08-19 MED ORDER — SODIUM CHLORIDE 0.9 % IJ SOLN: 3.0000 mL | INTRAMUSCULAR | Status: DC | PRN

## 2015-08-19 MED ORDER — LIDOCAINE HCL (PF) 1 % IJ SOLN
INTRAMUSCULAR | Status: AC
  Filled 2015-08-19: qty 30

## 2015-08-19 MED ORDER — MIDAZOLAM HCL 2 MG/2ML IJ SOLN
INTRAMUSCULAR | Status: DC | PRN
  Administered 2015-08-19: 1 mg via INTRAVENOUS

## 2015-08-19 MED ORDER — IOHEXOL 350 MG/ML SOLN
INTRAVENOUS | Status: DC | PRN
  Administered 2015-08-19: 110 mL via INTRAVENOUS

## 2015-08-19 MED ORDER — LOSARTAN POTASSIUM 50 MG PO TABS: 25.0000 mg | ORAL_TABLET | Freq: Every day | ORAL | Status: DC

## 2015-08-19 MED ORDER — FENTANYL CITRATE (PF) 100 MCG/2ML IJ SOLN
INTRAMUSCULAR | Status: DC | PRN
  Administered 2015-08-19: 25 ug via INTRAVENOUS

## 2015-08-19 MED ORDER — SODIUM CHLORIDE 0.9 % IJ SOLN: 3.0000 mL | Freq: Two times a day (BID) | INTRAMUSCULAR | Status: DC

## 2015-08-19 MED ORDER — HEPARIN SODIUM (PORCINE) 1000 UNIT/ML IJ SOLN
INTRAMUSCULAR | Status: AC
  Filled 2015-08-19: qty 1

## 2015-08-19 MED ORDER — ONDANSETRON HCL 4 MG/2ML IJ SOLN: 4.0000 mg | Freq: Four times a day (QID) | INTRAMUSCULAR | Status: DC | PRN

## 2015-08-19 MED ORDER — SODIUM CHLORIDE 0.9 % WEIGHT BASED INFUSION: 3.0000 mL/kg/h | INTRAVENOUS | Status: AC

## 2015-08-19 MED ORDER — SODIUM CHLORIDE 0.9 % IV SOLN: 250.0000 mL | INTRAVENOUS | Status: DC | PRN

## 2015-08-19 MED ORDER — FENTANYL CITRATE (PF) 100 MCG/2ML IJ SOLN
INTRAMUSCULAR | Status: AC
  Filled 2015-08-19: qty 4

## 2015-08-19 MED ORDER — METOPROLOL TARTRATE 12.5 MG HALF TABLET
12.5000 mg | ORAL_TABLET | Freq: Two times a day (BID) | ORAL | Status: DC
Start: 2015-08-19 — End: 2015-08-20
  Filled 2015-08-19: qty 1

## 2015-08-19 MED ORDER — LORATADINE 10 MG PO TABS
10.0000 mg | ORAL_TABLET | Freq: Every day | ORAL | Status: DC
  Administered 2015-08-19: 10 mg via ORAL
  Filled 2015-08-19: qty 1

## 2015-08-19 MED ORDER — METOPROLOL TARTRATE 25 MG PO TABS: 12.5000 mg | ORAL_TABLET | Freq: Two times a day (BID) | ORAL | Status: DC

## 2015-08-19 MED ORDER — ASPIRIN 81 MG PO CHEW: 81.0000 mg | CHEWABLE_TABLET | Freq: Every day | ORAL | Status: DC

## 2015-08-19 MED ORDER — LIDOCAINE HCL (PF) 1 % IJ SOLN
INTRAMUSCULAR | Status: DC | PRN
  Administered 2015-08-19: 17:00:00

## 2015-08-19 MED ORDER — SODIUM CHLORIDE 0.9 % WEIGHT BASED INFUSION: 1.0000 mL/kg/h | INTRAVENOUS | Status: DC

## 2015-08-19 MED ORDER — MIDAZOLAM HCL 2 MG/2ML IJ SOLN
INTRAMUSCULAR | Status: AC
  Filled 2015-08-19: qty 4

## 2015-08-19 MED ORDER — HEPARIN (PORCINE) IN NACL 2-0.9 UNIT/ML-% IJ SOLN
INTRAMUSCULAR | Status: AC
  Filled 2015-08-19: qty 1000

## 2015-08-19 MED ORDER — HEPARIN SODIUM (PORCINE) 1000 UNIT/ML IJ SOLN
INTRAMUSCULAR | Status: DC | PRN
  Administered 2015-08-19: 3000 [IU] via INTRAVENOUS

## 2015-08-19 MED ORDER — ASPIRIN 81 MG PO CHEW: 81.0000 mg | CHEWABLE_TABLET | ORAL | Status: DC

## 2015-08-19 MED ORDER — VERAPAMIL HCL 2.5 MG/ML IV SOLN
INTRAVENOUS | Status: DC | PRN
  Administered 2015-08-19: 17:00:00 via INTRA_ARTERIAL

## 2015-08-19 SURGICAL SUPPLY — 12 items
CATH EXPO 5F MPA-1 (CATHETERS) ×1 IMPLANT
CATH INFINITI JR4 5F (CATHETERS) ×2 IMPLANT
CATH LAUNCHER 5F JL3 (CATHETERS) IMPLANT
CATHETER LAUNCHER 5F JL3 (CATHETERS) ×2
DEVICE RAD COMP TR BAND LRG (VASCULAR PRODUCTS) ×2 IMPLANT
GLIDESHEATH SLEND A-KIT 6F 22G (SHEATH) ×2 IMPLANT
KIT HEART LEFT (KITS) ×2 IMPLANT
PACK CARDIAC CATHETERIZATION (CUSTOM PROCEDURE TRAY) ×2 IMPLANT
TRANSDUCER W/STOPCOCK (MISCELLANEOUS) ×2 IMPLANT
TUBING CIL FLEX 10 FLL-RA (TUBING) ×2 IMPLANT
WIRE HI TORQ VERSACORE-J 145CM (WIRE) ×1 IMPLANT
WIRE SAFE-T 1.5MM-J .035X260CM (WIRE) ×2 IMPLANT

## 2015-08-19 NOTE — Progress Notes (Signed)
Subjective: She was admitted last night with chest pain. She actually started with sweating and nausea and then developed chest pain on the left upper part of her chest that went up into her neck and into her left arm. She says she took 3 nitroglycerin over a period of about an hour with not much relief but then when she came to the emergency department she was given she thinks another nitroglycerin and aspirin and morphine which resolved her pain. She says she feels like she still has a "presence" but it's not pain now. She has ruled out for MI. She is known to have coronary artery occlusive disease. She says that she thinks her last cardiac catheterization was about 8-10 years ago. She has reflux also which complicates the situation.  Objective: Vital signs in last 24 hours: Temp:  [97.8 F (36.6 C)-98 F (36.7 C)] 98 F (36.7 C) (10/07 0421) Pulse Rate:  [89-101] 97 (10/07 0421) Resp:  [11-19] 18 (10/07 0421) BP: (114-135)/(59-77) 116/59 mmHg (10/07 0421) SpO2:  [97 %-100 %] 97 % (10/07 0421) Weight:  [61.916 kg (136 lb 8 oz)-62.143 kg (137 lb)] 61.916 kg (136 lb 8 oz) (10/06 1938) Weight change:  Last BM Date: 08/17/15  Intake/Output from previous day:    PHYSICAL EXAM General appearance: alert, cooperative and mild distress Resp: clear to auscultation bilaterally Cardio: regular rate and rhythm, S1, S2 normal, no murmur, click, rub or gallop GI: soft, non-tender; bowel sounds normal; no masses,  no organomegaly Extremities: extremities normal, atraumatic, no cyanosis or edema  Lab Results:  Results for orders placed or performed during the hospital encounter of 08/18/15 (from the past 48 hour(s))  Basic metabolic panel     Status: Abnormal   Collection Time: 08/18/15  4:51 PM  Result Value Ref Range   Sodium 132 (L) 135 - 145 mmol/L   Potassium 4.0 3.5 - 5.1 mmol/L   Chloride 96 (L) 101 - 111 mmol/L   CO2 26 22 - 32 mmol/L   Glucose, Bld 102 (H) 65 - 99 mg/dL   BUN 9 6 - 20  mg/dL   Creatinine, Ser 0.96 0.44 - 1.00 mg/dL   Calcium 8.6 (L) 8.9 - 10.3 mg/dL   GFR calc non Af Amer 58 (L) >60 mL/min   GFR calc Af Amer >60 >60 mL/min    Comment: (NOTE) The eGFR has been calculated using the CKD EPI equation. This calculation has not been validated in all clinical situations. eGFR's persistently <60 mL/min signify possible Chronic Kidney Disease.    Anion gap 10 5 - 15  CBC     Status: None   Collection Time: 08/18/15  4:51 PM  Result Value Ref Range   WBC 7.8 4.0 - 10.5 K/uL   RBC 3.90 3.87 - 5.11 MIL/uL   Hemoglobin 12.1 12.0 - 15.0 g/dL   HCT 37.3 36.0 - 46.0 %   MCV 95.6 78.0 - 100.0 fL   MCH 31.0 26.0 - 34.0 pg   MCHC 32.4 30.0 - 36.0 g/dL   RDW 14.4 11.5 - 15.5 %   Platelets 304 150 - 400 K/uL  Troponin I     Status: None   Collection Time: 08/18/15  4:51 PM  Result Value Ref Range   Troponin I <0.03 <0.031 ng/mL    Comment:        NO INDICATION OF MYOCARDIAL INJURY.   Troponin I-serum (0, 3, 6 hours)     Status: None   Collection Time: 08/18/15  8:07 PM  Result Value Ref Range   Troponin I <0.03 <0.031 ng/mL    Comment:        NO INDICATION OF MYOCARDIAL INJURY.   Troponin I-serum (0, 3, 6 hours)     Status: None   Collection Time: 08/18/15 10:55 PM  Result Value Ref Range   Troponin I <0.03 <0.031 ng/mL    Comment:        NO INDICATION OF MYOCARDIAL INJURY.   Troponin I-serum (0, 3, 6 hours)     Status: None   Collection Time: 08/19/15  1:56 AM  Result Value Ref Range   Troponin I <0.03 <0.031 ng/mL    Comment:        NO INDICATION OF MYOCARDIAL INJURY.     ABGS No results for input(s): PHART, PO2ART, TCO2, HCO3 in the last 72 hours.  Invalid input(s): PCO2 CULTURES No results found for this or any previous visit (from the past 240 hour(s)). Studies/Results: Dg Chest Port 1 View  08/18/2015   CLINICAL DATA:  Left-sided chest pain radiating down the left arm. Shortness of breath, nausea  EXAM: PORTABLE CHEST 1 VIEW   COMPARISON:  11/28/2012  FINDINGS: There is no focal parenchymal opacity. There is no pleural effusion or pneumothorax. The heart and mediastinal contours are unremarkable.  There is severe osteoarthritis of the right glenohumeral joint. There is a left shoulder arthroplasty.  IMPRESSION: No active disease.   Electronically Signed   By: Kathreen Devoid   On: 08/18/2015 17:29    Medications:  Prior to Admission:  Prescriptions prior to admission  Medication Sig Dispense Refill Last Dose  . ALPRAZolam (XANAX) 0.5 MG tablet Take 1 tablet by mouth daily as needed. anxiety   Past Month at Unknown time  . aspirin 81 MG tablet Take 81 mg by mouth daily.   08/17/2015 at Unknown time  . Cholecalciferol (VITAMIN D PO) Take 2,000 Int'l Units by mouth daily.   08/18/2015 at Unknown time  . docusate sodium (COLACE) 100 MG capsule Take 100 mg by mouth daily as needed for mild constipation or moderate constipation.    Past Week at Unknown time  . DULoxetine (CYMBALTA) 60 MG capsule Take 120 mg by mouth daily.    08/18/2015 at Unknown time  . estradiol (ESTRACE) 1 MG tablet Take 0.5 tablets (0.5 mg total) by mouth daily. Take 1/2 tablet po daily 30 tablet 6 08/18/2015 at Unknown time  . lamoTRIgine (LAMICTAL) 150 MG tablet Take 150 mg by mouth at bedtime.   08/17/2015 at Unknown time  . levothyroxine (LEVOTHROID) 25 MCG tablet Take 1 tablet (25 mcg total) by mouth daily. 30 tablet 12 08/18/2015 at Unknown time  . lidocaine (LIDODERM) 5 % Place 1-3 patches onto the skin as needed. Remove & Discard patch within 12 hours or as directed by MD   Past Month at Unknown time  . losartan (COZAAR) 50 MG tablet Take 1 tablet (50 mg total) by mouth daily. 90 tablet 3 08/18/2015 at Unknown time  . meloxicam (MOBIC) 7.5 MG tablet Take 7.5 mg by mouth 2 (two) times daily.    08/18/2015 at Unknown time  . Multiple Vitamin (MULTIVITAMIN) tablet Take 1 tablet by mouth daily.     08/17/2015 at Unknown time  . niacin (SLO-NIACIN) 500 MG tablet  Take 500 mg by mouth daily.    08/17/2015 at Unknown time  . NITROSTAT 0.4 MG SL tablet PLACE 1 TAB UNDER TONGUE EVERY 5 MIN IF NEEDED FOR CHEST  PAIN. MAY USE 3 TIMES.NO RELIEF CALL 911. 25 tablet 0 08/18/2015 at Unknown time  . Omega-3 Fatty Acids (FISH OIL) 1000 MG CAPS Take 1 capsule by mouth daily.    08/17/2015 at Unknown time  . oxyCODONE (OXY IR/ROXICODONE) 5 MG immediate release tablet Take 5 mg by mouth every 4 (four) hours as needed for severe pain.   08/18/2015 at Unknown time  . polyethylene glycol powder (GLYCOLAX/MIRALAX) powder MIX ONE CAPFUL (17GM) IN 8 OZ. OF FLUID ONCE A DAY AS NEEDED FOR CONSTIPATION. 527 g 11 Past Week at Unknown time  . Probiotic Product (PROBIOTIC DAILY PO) Take 1 tablet by mouth every morning. Florify   08/18/2015 at Unknown time  . RABEprazole (ACIPHEX) 20 MG tablet Take 1 tablet (20 mg total) by mouth 2 (two) times daily before a meal. (Patient taking differently: Take 20 mg by mouth daily. ) 60 tablet 5 08/18/2015 at Unknown time  . RESTASIS 0.05 % ophthalmic emulsion Place 1 drop into both eyes 2 (two) times daily.    08/18/2015 at Unknown time  . risperiDONE (RISPERDAL) 1 MG tablet Take 0.5 mg by mouth at bedtime.    08/17/2015 at Unknown time  . simvastatin (ZOCOR) 20 MG tablet TAKE (1) TABLET BY MOUTH AT BEDTIME. 90 tablet 3 08/17/2015 at Unknown time  . WELLBUTRIN XL 300 MG 24 hr tablet Take 300 mg by mouth daily.    08/18/2015 at Unknown time  . zolpidem (AMBIEN) 10 MG tablet Take 10 mg by mouth at bedtime as needed for sleep.    Past Week at Unknown time   Scheduled: . acidophilus  1 capsule Oral q morning - 10a  . aspirin  81 mg Oral Daily  . buPROPion  300 mg Oral Daily  . cholecalciferol  1,000 Units Oral Daily  . cycloSPORINE  1 drop Both Eyes BID  . DULoxetine  120 mg Oral Daily  . enoxaparin (LOVENOX) injection  40 mg Subcutaneous Q24H  . estradiol  0.5 mg Oral Daily  . lamoTRIgine  150 mg Oral QHS  . levothyroxine  25 mcg Oral QAC breakfast  .  losartan  50 mg Oral Daily  . meloxicam  7.5 mg Oral BID  . multivitamin with minerals  1 tablet Oral Daily  . niacin  500 mg Oral QHS  . omega-3 acid ethyl esters  1 capsule Oral Daily  . pantoprazole  40 mg Oral Daily  . risperiDONE  0.5 mg Oral QHS  . simvastatin  20 mg Oral q1800   Continuous:  YQM:[id number], ALPRAZolam, docusate sodium, lidocaine, morphine injection, nitroGLYCERIN, ondansetron (ZOFRAN) IV, oxyCODONE, zolpidem  Assesment: She came to the hospital with chest pain. She has ruled out for MI. She has multiple other medical problems. She has recently been having trouble with sciatica. At baseline she has GERD, hypertension coronary atherosclerosis fibromyalgia and bipolar disease. Active Problems:   Coronary atherosclerosis   Fibromyalgia   Chest pain   Hypertension    Plan: cardiology consultation and further evaluation and workup depending on their recommendations      Kamau Weatherall L 08/19/2015, 8:40 AM

## 2015-08-19 NOTE — Discharge Summary (Signed)
Discharge Summary   Patient ID: [name] [name],  MRN: [id number], DOB/AGE: Aug 10, 1941 74 y.o.  Admit date: 08/18/2015 Discharge date: 08/19/2015  Primary Care Provider: Alonza Bogus Primary Cardiologist: Lizbeth Bark, MD   Discharge Diagnoses Principal Problem:   Midsternal chest pain  **s/p catheterization this admission revealing non-obstructive CAD.  Active Problems:   Coronary atherosclerosis   Fibromyalgia   Hypertension   Bipolar affective disorder ([place])   Hyperlipidemia   Hypothyroidism   Allergies Allergies  Allergen Reactions  . Hydrocodone-Acetaminophen Hives    REACTION: urticaria (hives)  . Vicodin [Hydrocodone-Acetaminophen] Itching    Procedures  2D Echocardiogram 10.7.2016  Study Conclusions  - Left ventricle: The cavity size was normal. Wall thickness was   normal. Systolic function was normal. The estimated ejection   fraction was in the range of 60% to 65%. Wall motion was normal;   there were no regional wall motion abnormalities. Doppler   parameters are consistent with abnormal left ventricular   relaxation (grade 1 diastolic dysfunction). - Aortic valve: Trileaflet; mildly calcified leaflets. There was no   stenosis. - Mitral valve: Mildly calcified annulus. - Left atrium: The atrium was mildly dilated. - Tricuspid valve: There was mild regurgitation. - Pulmonary arteries: Systolic pressure was mildly increased. PA   peak pressure: 32 mm Hg (S). - Systemic veins: IVC mildly dilated with normal respiratory   variation. Estimated CVP 8 mmHg. _____________   Cardiac Catheterization 10.7.2016  Coronary Findings     Dominance: Right    Left Main   . LM lesion, 40% stenosed.      Left Circumflex  The vessel is small.   Colon Flattery Cx lesion, 30% stenosed.   . Mid Cx lesion, 55% stenosed. diffuse eccentric.   Marland Kitchen Second Obtuse Marginal Branch   The vessel is small in size.      Right Coronary Artery   . Right Posterior Descending Artery   The vessel is small in size.   . Inferior Septal   The vessel is small in size.   . Right Posterior Atrioventricular Branch   The vessel is small in size.     Left Ventricle The left ventricular size is normal. The left ventricular ejection fraction is 55-65% by visual estimate. _____________   History of Present Illness  74 y/o female with a h/o non-obstructive CAD, HTN, HL, and fibromyalgia. She was in her usual state of health until 1 day prior to admission when she began to experience left sided chest discomfort radiating to her neck and left shoulder, assoicated with nausea and diaphoresis.  Symptoms persisted despite taking sl NTG @ home, thus prompting her to present to the [facility] ED.  There, ECG was non-acute and troponin was normal.  She was admitted and subsequently ruled out for MI but continued to have c/p.  She was seen by cardiology and transferred to [facility] for [facility] evaluation.  Hospital Course  Following arrival to [facility], [name] [name] underwent diagnostic catheterization revealing non-obstructive CAD and normal LV function.  Continued medical therapy has been recommended and she will be maintained on ASA and statin therapy.  She will be discharged home this evening in good condition.  Discharge Vitals Blood pressure 154/72, pulse 0, temperature 98.2 F (36.8 C), temperature source Oral, resp. rate 18, height 4\' 9"  (1.448 m), weight 136 lb 8 oz (61.916 kg), last menstrual period 02/10/1993, SpO2 94 %.  Filed Weights   08/18/15 1651 08/18/15 1938  Weight: 137 lb (62.143 kg) 136  lb 8 oz (61.916 kg)    Labs  CBC  Recent Labs  08/18/15 1651  WBC 7.8  HGB 12.1  HCT 37.3  MCV 95.6  PLT 343   Basic Metabolic Panel  Recent Labs  08/18/15 1651  NA 132*  K 4.0  CL 96*  CO2 26  GLUCOSE 102*  BUN 9  CREATININE 0.96  CALCIUM 8.6*   Cardiac Enzymes  Recent Labs  08/18/15 2007 08/18/15 2255 08/19/15 0156  TROPONINI <0.03 <0.03 <0.03    Disposition  Pt is being discharged home today in good condition.  Follow-up Plans & Appointments  Follow-up Information    Follow up with HAWKINS,EDWARD L, MD.   Specialty:  Pulmonary Disease   Why:  1-2 wks   Contact information:   [address] [place] [address] [phone]       Follow up with Dorris Carnes, MD.   Specialty:  Cardiology   Why:  As needed   Contact information:   [place] [address] [phone]      Discharge Medications    Medication List    TAKE these medications        ALPRAZolam 0.5 MG tablet  Commonly known as:  XANAX  Take 1 tablet by mouth daily as needed. anxiety     aspirin 81 MG tablet  Take 81 mg by mouth daily.     docusate sodium 100 MG capsule  Commonly known as:  COLACE  Take 100 mg by mouth daily as needed for mild constipation or moderate constipation.     DULoxetine 60 MG capsule  Commonly known as:  CYMBALTA  Take 120 mg by mouth daily.     estradiol 1 MG tablet  Commonly known as:  ESTRACE  Take 0.5 tablets (0.5 mg total) by mouth daily. Take 1/2 tablet po daily     Fish Oil 1000 MG Caps  Take 1 capsule by mouth daily.     lamoTRIgine 150 MG tablet  Commonly known as:  LAMICTAL  Take 150 mg by mouth at bedtime.     levothyroxine 25 MCG tablet  Commonly known as:  LEVOTHROID  Take 1 tablet (25 mcg total) by mouth daily.     lidocaine 5 %  Commonly known as:  LIDODERM  Place 1-3 patches onto the skin as needed. Remove & Discard patch within 12 hours or as directed by MD     losartan 50 MG tablet  Commonly known as:  COZAAR  Take 1 tablet (50 mg total) by mouth daily.     meloxicam 7.5 MG tablet  Commonly known as:  MOBIC  Take 7.5 mg by mouth 2 (two) times daily.     multivitamin tablet  Take 1 tablet by mouth daily.     niacin 500 MG tablet  Commonly known as:  SLO-NIACIN  Take 500 mg by mouth daily.     NITROSTAT 0.4 MG SL tablet  Generic  drug:  nitroGLYCERIN  PLACE 1 TAB UNDER TONGUE EVERY 5 MIN IF NEEDED FOR CHEST PAIN. MAY USE 3 TIMES.NO RELIEF CALL 911.     oxyCODONE 5 MG immediate release tablet  Commonly known as:  Oxy IR/ROXICODONE  Take 5 mg by mouth every 4 (four) hours as needed for severe pain.     polyethylene glycol powder powder  Commonly known as:  GLYCOLAX/MIRALAX  MIX ONE CAPFUL (17GM) IN 8 OZ. OF FLUID ONCE A DAY AS NEEDED FOR  CONSTIPATION.     PROBIOTIC DAILY PO  Take 1 tablet by mouth every morning. Florify     RABEprazole 20 MG tablet  Commonly known as:  ACIPHEX  Take 1 tablet (20 mg total) by mouth 2 (two) times daily before a meal.     RESTASIS 0.05 % ophthalmic emulsion  Generic drug:  cycloSPORINE  Place 1 drop into both eyes 2 (two) times daily.     risperiDONE 1 MG tablet  Commonly known as:  RISPERDAL  Take 0.5 mg by mouth at bedtime.     simvastatin 20 MG tablet  Commonly known as:  ZOCOR  TAKE (1) TABLET BY MOUTH AT BEDTIME.     VITAMIN D PO  Take 2,000 Int'l Units by mouth daily.     WELLBUTRIN XL 300 MG 24 hr tablet  Generic drug:  buPROPion  Take 300 mg by mouth daily.     zolpidem 10 MG tablet  Commonly known as:  AMBIEN  Take 10 mg by mouth at bedtime as needed for sleep.        Outstanding Labs/Studies  None  Duration of Discharge Encounter   Greater than 30 minutes including physician time.  Signed, Murray Hodgkins NP 08/19/2015, 7:06 PM

## 2015-08-19 NOTE — Care Management Note (Signed)
Case Management Note  Patient Details  Name: [name] [name] MRN: [id number] Date of Birth: May 27, 1941  Subjective/Objective:                  Pt admitted from home with CP. Pt lives alone and will return home at discharge. Pt is fairly independent with ADL's. Pt has a cane for prn use.  Action/Plan: Pt to transfer to [facility] for cath. CM on receiving unit will follow for discharge needs.  Expected Discharge Date:                  Expected Discharge Plan:  Acute to Acute Transfer  In-House Referral:  NA  Discharge planning Services  CM Consult  Post Acute Care Choice:  NA Choice offered to:  NA  DME Arranged:    DME Agency:     HH Arranged:    HH Agency:     Status of Service:  Completed, signed off  Medicare Important Message Given:    Date Medicare IM Given:    Medicare IM give by:    Date Additional Medicare IM Given:    Additional Medicare Important Message give by:     If discussed at [place] of Stay Meetings, dates discussed:    Additional Comments:  Joylene Draft, RN 08/19/2015, 11:41 AM

## 2015-08-19 NOTE — Research (Signed)
CADLAD Informed Consent   Subject Name: [name] [name]  Subject met inclusion and exclusion criteria.  The informed consent form, study requirements and expectations were reviewed with the subject and questions and concerns were addressed prior to the signing of the consent form.  The subject verbalized understanding of the trail requirements.  The subject agreed to participate in the CADLAD trial and signed the informed consent.  The informed consent was obtained prior to performance of any protocol-specific procedures for the subject.  A copy of the signed informed consent was given to the subject and a copy was placed in the subject's medical record.  Sandie Ano 08/19/2015, 15:45

## 2015-08-19 NOTE — H&P (View-Only) (Signed)
CARDIOLOGY CONSULT NOTE   Patient ID: [name] [name] MRN: [id number] DOB/AGE: 04/12/1941 74 y.o.  Admit Date: 08/18/2015 Referring Physician: Sinda Du MD Primary Physician: Alonza Bogus, MD Consulting Cardiologist: Kate Sable MD Primary Cardiologist Dorris Carnes MD Reason for Consultation: Chest Pain with known CAD  Clinical Summary [name] [name] is a 74 y.o.female with history of CAD, most recent cath in 2010 demonstrating non-obstructive disease of the most significantly a 50-60% lesion in her LAD, with normal left ventricular function.  Other history includes GERD, hyperlipidemia, hypertension, fibromyalgia, and Bipolar disorder, She was last seen by Dr. Harrington Challenger in 11/2014.   She states she was packing to go out of town and began to feel nauseated and diaphoretic. This occurred around 3:15 pm She put some ice on her stomach and chest. She then began to have  Left sided chest pain, radiating to her left shoulder,, neck and subscapular area. She took 3 NTG but did not find relief. By 7 pm, she decided to go to ER as pain did not subside. She states that it was constant, but waxing and waning over the time before going to ER. She also states that prior to this,two weeks ago, she had chest discomfort, similar in description that lasted about an hour. She took NTG and pain began to subside. She did not seek medical attention.   In ER, BP normal, HR 96 bpm. Afebrile. Mildly hyponatremic. CXR was negative for cardiopulmonary abnormalities, EKG demonstrated NSR with non-specific T-wave abnormalities in the anterior leads.No evidence of ACS. She was treated with ASA, Morphine, and zofran.    Allergies  Allergen Reactions  . Hydrocodone-Acetaminophen Hives    REACTION: urticaria (hives)  . Vicodin [Hydrocodone-Acetaminophen] Itching    Medications Scheduled Medications: . acidophilus  1 capsule Oral q morning - 10a  . aspirin  81 mg Oral Daily  . buPROPion  300 mg Oral Daily    . cholecalciferol  1,000 Units Oral Daily  . cycloSPORINE  1 drop Both Eyes BID  . DULoxetine  120 mg Oral Daily  . enoxaparin (LOVENOX) injection  40 mg Subcutaneous Q24H  . estradiol  0.5 mg Oral Daily  . lamoTRIgine  150 mg Oral QHS  . levothyroxine  25 mcg Oral QAC breakfast  . losartan  50 mg Oral Daily  . meloxicam  7.5 mg Oral BID  . multivitamin with minerals  1 tablet Oral Daily  . niacin  500 mg Oral QHS  . omega-3 acid ethyl esters  1 capsule Oral Daily  . pantoprazole  40 mg Oral Daily  . risperiDONE  0.5 mg Oral QHS  . simvastatin  20 mg Oral q1800      PRN Medications: acetaminophen, ALPRAZolam, docusate sodium, lidocaine, morphine injection, nitroGLYCERIN, ondansetron (ZOFRAN) IV, oxyCODONE, zolpidem   Past Medical History  Diagnosis Date  . CAD (coronary artery disease)   . Chest pain   . Dizziness   . Dyslipidemia   . GERD (gastroesophageal reflux disease)   . Bipolar affective disorder ([place])   . Fibromyalgia   . H/O: hysterectomy   . Hyperlipidemia   . Arthritis   . Chronic fatigue   . Hypertension   . Hx of adenomatous colonic polyps 2005  . Osteopenia   . Chronic back pain   . Sciatica   . Knee pain     Past Surgical History  Procedure Laterality Date  . Colonoscopy  01/29/2007    GBE:EFEOFH rectum, left-sided diverticula, diffusely pigmented colon consistent with  melanosis coli.  Remainder of colonic mucosa was normal  . [place] bunionectomy      bilateral  . Esophagogastroduodenoscopy      followed by colonoscopy with snare polypectomy  . Cystectomy      pilonidial cyst  . Breast lumpectomy    . Spinal fusion    . Right knee replacement  02/2013  . Breast biopsy Left 1989  . Shoulder arthroscopy Left 2005  . Bunionectomy Left 2005  . Spinal fusion  2004    L4-L5  . Total hip arthroplasty Right 06/2006  . Total shoulder replacement Left 08/2005  . Total hip arthroplasty  8/12  . Abdominal hysterectomy  1994    TAH- DUB, BSO  .  Shoulder arthroscopy Right   . Colonoscopy with esophagogastroduodenoscopy (egd) N/A 02/01/2014    LPF:XTKW erosive reflux/gastric polyp/normal rectum/scattered left sided diverticula, normal distal TI. gastric bx negative, fundic gland polyp. next TCS 01/2019.  Marland Kitchen Cardiac catheterization  2010    +CAD    Family History  Problem Relation Age of Onset  . Heart disease Mother   . Heart attack Mother   . Heart disease Father   . Heart attack Father   . Stroke Sister   . Stroke Maternal Grandfather   . Cancer Paternal Grandmother     unknown primary  . Colon cancer Neg Hx     Social History [name] [name] reports that she has never smoked. She has never used smokeless tobacco. [name] [name] reports that she drinks about 3.5 oz of alcohol per week.  Review of Systems Complete review of systems are found to be negative unless outlined in H&P above.  Physical Examination Blood pressure 116/59, pulse 97, temperature 98 F (36.7 C), temperature source Oral, resp. rate 18, height 4\' 9"  (1.448 m), weight 136 lb 8 oz (61.916 kg), last menstrual period 02/10/1993, SpO2 97 %.  Intake/Output Summary (Last 24 hours) at 08/19/15 1039 Last data filed at 08/19/15 0944  Gross per 24 hour  Intake    240 ml  Output      0 ml  Net    240 ml    Telemetry: NSR  GEN: No acute distress.  HEENT: Conjunctiva and lids normal, oropharynx clear with moist mucosa. Neck: Supple, no elevated JVP or carotid bruits, no thyromegaly. Lungs: Clear to auscultation, nonlabored breathing at rest. Cardiac: Regular rate and rhythm, no S3 or significant systolic murmur, no pericardial rub. Abdomen: Soft, nontender, no hepatomegaly, bowel sounds present, no guarding or rebound.Neg  Murpheys' sign. Extremities: No pitting edema, distal pulses 2+. Skin: Warm and dry. Musculoskeletal: No kyphosis. Neuropsychiatric: Alert and oriented x3, affect grossly appropriate.  Prior Cardiac Testing/Procedures 1. Cardiac Cath:  12/27/2008 CONCLUSIONS: 1. Preserved overall left ventricular systolic function. 2. A 20-30% proximal right coronary artery stenosis. 3. A 50-60% ostial left anterior descending stenosis compared to the  previous study, this appears to be slightly improved from 6 years  earlier. 4. Scattered lesions of the circumflex as noted above.  DISPOSITION: 1. If the patient continues to have pain, then fractional flow reserve  could be done. Previous Myoview was abnormal. Because this  appeared somewhat better today, we did not elect to do this to  avoid anticoagulation for possible disruption of the lesion. We  will go from there. 2. Continued medical therapy.  Lab Results  Basic Metabolic Panel:  Recent Labs Lab 08/18/15 1651  NA 132*  K 4.0  CL 96*  CO2 26  GLUCOSE 102*  BUN  9  CREATININE 0.96  CALCIUM 8.6*     Recent Labs Lab 08/18/15 1651  WBC 7.8  HGB 12.1  HCT 37.3  MCV 95.6  PLT 304    Cardiac Enzymes:  Recent Labs Lab 08/18/15 1651 08/18/15 2007 08/18/15 2255 08/19/15 0156  TROPONINI <0.03 <0.03 <0.03 <0.03    Radiology: Dg Chest Port 1 View  08/18/2015   CLINICAL DATA:  Left-sided chest pain radiating down the left arm. Shortness of breath, nausea  EXAM: PORTABLE CHEST 1 VIEW  COMPARISON:  11/28/2012  FINDINGS: There is no focal parenchymal opacity. There is no pleural effusion or pneumothorax. The heart and mediastinal contours are unremarkable.  There is severe osteoarthritis of the right glenohumeral joint. There is a left shoulder arthroplasty.  IMPRESSION: No active disease.   Electronically Signed   By: Kathreen Devoid   On: 08/18/2015 17:29     ECG: NSR with non-specific T wave abnormalities in the anterior leads.    Impression and Recommendations  1. Chest Pain: Worrisome for angina, with know history of CAD. Most recent cath in 2010, with non-obstructive CAD at that time 50%-60% lesion in LAD. She has had  recurrent chest pain, once two weeks ago, lasting one hour with some diaphoresis, and again yesterday with associated nausea, diaphoresis, and radiation. She has chronic generalized fatigue from fibromyalgia. She states she has some recurrent of mild discomfort in her left neck and left arm earlier this am.       Troponin is negative X 4.EKG negative for ACS. Essentially unchanged from previous EKG in Jan 2016. Will discuss with Dr. Bronson Ing need to have her transferred for cardiac cath vs ongoing medical management. I broached this with the patient and her daughter at bedside do to recurrence of chest pain.  Continue ASA, statin, ARB. Consider adding nitrates if cardiac cath is not completed. She is not on BB,would add low dose 12.5 mg BID as HR remains elevated.   2. Hypertension: She is currently normotensive. She will continue ARB will reduce the dose as we are dose as we are adding BB.   3. Dyslipidemia: Continue statin.    Signed: Phill Myron. Lawrence NP AACC  08/19/2015, 10:39 AM Co-Sign MD  The patient was seen and examined, and I agree with the assessment and plan as documented above, with modifications as noted below. Pt with CAD with last cath on 12-27-2008 by Dr. Lia Foyer demonstrating 20-30% proximal RCA, 50-60% ostial LAD, and scattered left circumflex lesions. While packing for trip yesterday, developed sudden onset of severe precordial pain accompanied by nausea and diaphoresis. Took 3 SL nitro tablets without complete relief. Pain lasted until 7 pm when she came to hospital and was given more nitroglycerin and IV morphine. Troponins are normal. ECG shows nonspecific T wave abnormalities in V2-3, not markedly different from 11/2014. This morning, she still has residual chest discomfort. Her daughter, [name] [name], from [place] is present. The patient lives alone by herself in [place] and is widowed.  We had a very lengthy discussion about management options, including the addition  of beta blockers and nitrates with inpatient echocardiography and outpatient stress testing vs proceeding with coronary angiography to definitely exclude hemodynamically significant CAD. I will add low-dose metoprolol tartrate 12.5 mg bid and obtain an echocardiogram as she awaits transfer to [facility]. She received one dose of Lovenox on 10/6. After much discussion, it was decided to proceed with coronary angiography.

## 2015-08-19 NOTE — Discharge Instructions (Signed)

## 2015-08-19 NOTE — Progress Notes (Signed)
Patient ambulated with standby assistant 200 ft, no c/o of pain or shortness of breath

## 2015-08-19 NOTE — Progress Notes (Signed)
Pt. Transferred to [facility] heart cath lab via [place].

## 2015-08-19 NOTE — Interval H&P Note (Signed)
Cath Lab Visit (complete for each Cath Lab visit)  Clinical Evaluation Leading to the Procedure:   ACS: Yes.    Non-ACS:    Anginal Classification: CCS III  Anti-ischemic medical therapy: Minimal Therapy (1 class of medications)  Non-Invasive Test Results: No non-invasive testing performed  Prior CABG: No previous CABG      History and Physical Interval Note:  08/19/2015 3:05 PM  [name] [name]  has presented today for surgery, with the diagnosis of cp  The various methods of treatment have been discussed with the patient and family. After consideration of risks, benefits and other options for treatment, the patient has consented to  Procedure(s): Left Heart Cath and Coronary Angiography (N/A) as a surgical intervention .  The patient's history has been reviewed, patient examined, no change in status, stable for surgery.  I have reviewed the patient's chart and labs.  Questions were answered to the patient's satisfaction.     Sinclair Grooms

## 2015-08-19 NOTE — Consult Note (Signed)
CARDIOLOGY CONSULT NOTE   Patient ID: [name] [name] MRN: [id number] DOB/AGE: September 25, 1941 74 y.o.  Admit Date: 08/18/2015 Referring Physician: Sinda Du MD Primary Physician: Alonza Bogus, MD Consulting Cardiologist: Kate Sable MD Primary Cardiologist Dorris Carnes MD Reason for Consultation: Chest Pain with known CAD  Clinical Summary [name] [name] is a 74 y.o.female with history of CAD, most recent cath in 2010 demonstrating non-obstructive disease of the most significantly a 50-60% lesion in her LAD, with normal left ventricular function.  Other history includes GERD, hyperlipidemia, hypertension, fibromyalgia, and Bipolar disorder, She was last seen by Dr. Harrington Challenger in 11/2014.   She states she was packing to go out of town and began to feel nauseated and diaphoretic. This occurred around 3:15 pm She put some ice on her stomach and chest. She then began to have  Left sided chest pain, radiating to her left shoulder,, neck and subscapular area. She took 3 NTG but did not find relief. By 7 pm, she decided to go to ER as pain did not subside. She states that it was constant, but waxing and waning over the time before going to ER. She also states that prior to this,two weeks ago, she had chest discomfort, similar in description that lasted about an hour. She took NTG and pain began to subside. She did not seek medical attention.   In ER, BP normal, HR 96 bpm. Afebrile. Mildly hyponatremic. CXR was negative for cardiopulmonary abnormalities, EKG demonstrated NSR with non-specific T-wave abnormalities in the anterior leads.No evidence of ACS. She was treated with ASA, Morphine, and zofran.    Allergies  Allergen Reactions  . Hydrocodone-Acetaminophen Hives    REACTION: urticaria (hives)  . Vicodin [Hydrocodone-Acetaminophen] Itching    Medications Scheduled Medications: . acidophilus  1 capsule Oral q morning - 10a  . aspirin  81 mg Oral Daily  . buPROPion  300 mg Oral Daily    . cholecalciferol  1,000 Units Oral Daily  . cycloSPORINE  1 drop Both Eyes BID  . DULoxetine  120 mg Oral Daily  . enoxaparin (LOVENOX) injection  40 mg Subcutaneous Q24H  . estradiol  0.5 mg Oral Daily  . lamoTRIgine  150 mg Oral QHS  . levothyroxine  25 mcg Oral QAC breakfast  . losartan  50 mg Oral Daily  . meloxicam  7.5 mg Oral BID  . multivitamin with minerals  1 tablet Oral Daily  . niacin  500 mg Oral QHS  . omega-3 acid ethyl esters  1 capsule Oral Daily  . pantoprazole  40 mg Oral Daily  . risperiDONE  0.5 mg Oral QHS  . simvastatin  20 mg Oral q1800      PRN Medications: acetaminophen, ALPRAZolam, docusate sodium, lidocaine, morphine injection, nitroGLYCERIN, ondansetron (ZOFRAN) IV, oxyCODONE, zolpidem   Past Medical History  Diagnosis Date  . CAD (coronary artery disease)   . Chest pain   . Dizziness   . Dyslipidemia   . GERD (gastroesophageal reflux disease)   . Bipolar affective disorder ([place])   . Fibromyalgia   . H/O: hysterectomy   . Hyperlipidemia   . Arthritis   . Chronic fatigue   . Hypertension   . Hx of adenomatous colonic polyps 2005  . Osteopenia   . Chronic back pain   . Sciatica   . Knee pain     Past Surgical History  Procedure Laterality Date  . Colonoscopy  01/29/2007    JZP:HXTAVW rectum, left-sided diverticula, diffusely pigmented colon consistent with  melanosis coli.  Remainder of colonic mucosa was normal  . [place] bunionectomy      bilateral  . Esophagogastroduodenoscopy      followed by colonoscopy with snare polypectomy  . Cystectomy      pilonidial cyst  . Breast lumpectomy    . Spinal fusion    . Right knee replacement  02/2013  . Breast biopsy Left 1989  . Shoulder arthroscopy Left 2005  . Bunionectomy Left 2005  . Spinal fusion  2004    L4-L5  . Total hip arthroplasty Right 06/2006  . Total shoulder replacement Left 08/2005  . Total hip arthroplasty  8/12  . Abdominal hysterectomy  1994    TAH- DUB, BSO  .  Shoulder arthroscopy Right   . Colonoscopy with esophagogastroduodenoscopy (egd) N/A 02/01/2014    DZH:GDJM erosive reflux/gastric polyp/normal rectum/scattered left sided diverticula, normal distal TI. gastric bx negative, fundic gland polyp. next TCS 01/2019.  Marland Kitchen Cardiac catheterization  2010    +CAD    Family History  Problem Relation Age of Onset  . Heart disease Mother   . Heart attack Mother   . Heart disease Father   . Heart attack Father   . Stroke Sister   . Stroke Maternal Grandfather   . Cancer Paternal Grandmother     unknown primary  . Colon cancer Neg Hx     Social History [name] [name] reports that she has never smoked. She has never used smokeless tobacco. [name] [name] reports that she drinks about 3.5 oz of alcohol per week.  Review of Systems Complete review of systems are found to be negative unless outlined in H&P above.  Physical Examination Blood pressure 116/59, pulse 97, temperature 98 F (36.7 C), temperature source Oral, resp. rate 18, height 4\' 9"  (1.448 m), weight 136 lb 8 oz (61.916 kg), last menstrual period 02/10/1993, SpO2 97 %.  Intake/Output Summary (Last 24 hours) at 08/19/15 1039 Last data filed at 08/19/15 0944  Gross per 24 hour  Intake    240 ml  Output      0 ml  Net    240 ml    Telemetry: NSR  GEN: No acute distress.  HEENT: Conjunctiva and lids normal, oropharynx clear with moist mucosa. Neck: Supple, no elevated JVP or carotid bruits, no thyromegaly. Lungs: Clear to auscultation, nonlabored breathing at rest. Cardiac: Regular rate and rhythm, no S3 or significant systolic murmur, no pericardial rub. Abdomen: Soft, nontender, no hepatomegaly, bowel sounds present, no guarding or rebound.Neg  Murpheys' sign. Extremities: No pitting edema, distal pulses 2+. Skin: Warm and dry. Musculoskeletal: No kyphosis. Neuropsychiatric: Alert and oriented x3, affect grossly appropriate.  Prior Cardiac Testing/Procedures 1. Cardiac Cath:  12/27/2008 CONCLUSIONS: 1. Preserved overall left ventricular systolic function. 2. A 20-30% proximal right coronary artery stenosis. 3. A 50-60% ostial left anterior descending stenosis compared to the  previous study, this appears to be slightly improved from 6 years  earlier. 4. Scattered lesions of the circumflex as noted above.  DISPOSITION: 1. If the patient continues to have pain, then fractional flow reserve  could be done. Previous Myoview was abnormal. Because this  appeared somewhat better today, we did not elect to do this to  avoid anticoagulation for possible disruption of the lesion. We  will go from there. 2. Continued medical therapy.  Lab Results  Basic Metabolic Panel:  Recent Labs Lab 08/18/15 1651  NA 132*  K 4.0  CL 96*  CO2 26  GLUCOSE 102*  BUN  9  CREATININE 0.96  CALCIUM 8.6*     Recent Labs Lab 08/18/15 1651  WBC 7.8  HGB 12.1  HCT 37.3  MCV 95.6  PLT 304    Cardiac Enzymes:  Recent Labs Lab 08/18/15 1651 08/18/15 2007 08/18/15 2255 08/19/15 0156  TROPONINI <0.03 <0.03 <0.03 <0.03    Radiology: Dg Chest Port 1 View  08/18/2015   CLINICAL DATA:  Left-sided chest pain radiating down the left arm. Shortness of breath, nausea  EXAM: PORTABLE CHEST 1 VIEW  COMPARISON:  11/28/2012  FINDINGS: There is no focal parenchymal opacity. There is no pleural effusion or pneumothorax. The heart and mediastinal contours are unremarkable.  There is severe osteoarthritis of the right glenohumeral joint. There is a left shoulder arthroplasty.  IMPRESSION: No active disease.   Electronically Signed   By: Kathreen Devoid   On: 08/18/2015 17:29     ECG: NSR with non-specific T wave abnormalities in the anterior leads.    Impression and Recommendations  1. Chest Pain: Worrisome for angina, with know history of CAD. Most recent cath in 2010, with non-obstructive CAD at that time 50%-60% lesion in LAD. She has had  recurrent chest pain, once two weeks ago, lasting one hour with some diaphoresis, and again yesterday with associated nausea, diaphoresis, and radiation. She has chronic generalized fatigue from fibromyalgia. She states she has some recurrent of mild discomfort in her left neck and left arm earlier this am.       Troponin is negative X 4.EKG negative for ACS. Essentially unchanged from previous EKG in Jan 2016. Will discuss with Dr. Bronson Ing need to have her transferred for cardiac cath vs ongoing medical management. I broached this with the patient and her daughter at bedside do to recurrence of chest pain.  Continue ASA, statin, ARB. Consider adding nitrates if cardiac cath is not completed. She is not on BB,would add low dose 12.5 mg BID as HR remains elevated.   2. Hypertension: She is currently normotensive. She will continue ARB will reduce the dose as we are dose as we are adding BB.   3. Dyslipidemia: Continue statin.    Signed: Phill Myron. Lawrence NP AACC  08/19/2015, 10:39 AM Co-Sign MD  The patient was seen and examined, and I agree with the assessment and plan as documented above, with modifications as noted below. Pt with CAD with last cath on 12-27-2008 by Dr. Lia Foyer demonstrating 20-30% proximal RCA, 50-60% ostial LAD, and scattered left circumflex lesions. While packing for trip yesterday, developed sudden onset of severe precordial pain accompanied by nausea and diaphoresis. Took 3 SL nitro tablets without complete relief. Pain lasted until 7 pm when she came to hospital and was given more nitroglycerin and IV morphine. Troponins are normal. ECG shows nonspecific T wave abnormalities in V2-3, not markedly different from 11/2014. This morning, she still has residual chest discomfort. Her daughter, [name] [name], from [place] is present. The patient lives alone by herself in [place] and is widowed.  We had a very lengthy discussion about management options, including the addition  of beta blockers and nitrates with inpatient echocardiography and outpatient stress testing vs proceeding with coronary angiography to definitely exclude hemodynamically significant CAD. I will add low-dose metoprolol tartrate 12.5 mg bid and obtain an echocardiogram as she awaits transfer to [facility]. She received one dose of Lovenox on 10/6. After much discussion, it was decided to proceed with coronary angiography.

## 2015-08-22 ENCOUNTER — Encounter (HOSPITAL_COMMUNITY): Payer: Self-pay | Admitting: Interventional Cardiology

## 2015-09-06 ENCOUNTER — Ambulatory Visit (INDEPENDENT_AMBULATORY_CARE_PROVIDER_SITE_OTHER): Payer: Medicare Other | Admitting: Adult Health

## 2015-09-06 ENCOUNTER — Encounter: Payer: Self-pay | Admitting: Adult Health

## 2015-09-06 VITALS — BP 122/76 | HR 100 | Ht <= 58 in | Wt 137.0 lb

## 2015-09-06 DIAGNOSIS — E7800 Pure hypercholesterolemia, unspecified: Secondary | ICD-10-CM

## 2015-09-06 DIAGNOSIS — I2510 Atherosclerotic heart disease of native coronary artery without angina pectoris: Secondary | ICD-10-CM

## 2015-09-06 DIAGNOSIS — R0789 Other chest pain: Secondary | ICD-10-CM

## 2015-09-06 NOTE — Progress Notes (Signed)
Name: [name] [name]    DOB: 11/07/1941  Age: 74 y.o.  MR#: [id number]       PCP:  Alonza Bogus, MD      Insurance: Payor: [employer] MEDICARE / Plan: [facility] MEDICARE / Product Type: *No Product type* /   CC:    Chief Complaint  Patient presents with  . Chest Pain    VS Filed Vitals:   09/06/15 1330  BP: 122/76  Pulse: 100  Height: '4\' 9"'  (1.448 m)  Weight: 137 lb (62.143 kg)  SpO2: 95%    Weights Current Weight  09/06/15 137 lb (62.143 kg)  08/18/15 136 lb 8 oz (61.916 kg)  08/04/15 137 lb (62.143 kg)    Blood Pressure  BP Readings from Last 3 Encounters:  09/06/15 122/76  08/19/15 162/142  08/04/15 118/60     Admit date:  (Not on file) Last encounter with RMR:  Visit date not found   Allergy Hydrocodone-acetaminophen and Vicodin  Current Outpatient Prescriptions  Medication Sig Dispense Refill  . ALPRAZolam (XANAX) 0.5 MG tablet Take 1 tablet by mouth daily as needed. anxiety    . aspirin 81 MG tablet Take 81 mg by mouth daily.    . Cholecalciferol (VITAMIN D PO) Take 2,000 Int'l Units by mouth daily.    Marland Kitchen docusate sodium (COLACE) 100 MG capsule Take 100 mg by mouth daily as needed for mild constipation or moderate constipation.     . DULoxetine (CYMBALTA) 60 MG capsule Take 120 mg by mouth daily.     Marland Kitchen estradiol (ESTRACE) 1 MG tablet Take 0.5 tablets (0.5 mg total) by mouth daily. Take 1/2 tablet po daily 30 tablet 6  . lamoTRIgine (LAMICTAL) 150 MG tablet Take 150 mg by mouth at bedtime.    Marland Kitchen levothyroxine (LEVOTHROID) 25 MCG tablet Take 1 tablet (25 mcg total) by mouth daily. 30 tablet 12  . lidocaine (LIDODERM) 5 % Place 1-3 patches onto the skin as needed. Remove & Discard patch within 12 hours or as directed by MD    . losartan (COZAAR) 50 MG tablet Take 1 tablet (50 mg total) by mouth daily. 90 tablet 3  . meloxicam (MOBIC) 7.5 MG tablet Take 7.5 mg by mouth 2 (two) times daily.     . Multiple Vitamin (MULTIVITAMIN) tablet Take 1 tablet by mouth  daily.      . niacin (SLO-NIACIN) 500 MG tablet Take 500 mg by mouth daily.     Marland Kitchen NITROSTAT 0.4 MG SL tablet PLACE 1 TAB UNDER TONGUE EVERY 5 MIN IF NEEDED FOR CHEST PAIN. MAY USE 3 TIMES.NO RELIEF CALL 911. 25 tablet 0  . Omega-3 Fatty Acids (FISH OIL) 1000 MG CAPS Take 1 capsule by mouth daily.     Marland Kitchen oxyCODONE (OXY IR/ROXICODONE) 5 MG immediate release tablet Take 5 mg by mouth every 4 (four) hours as needed for severe pain.    . polyethylene glycol powder (GLYCOLAX/MIRALAX) powder MIX ONE CAPFUL (17GM) IN 8 OZ. OF FLUID ONCE A DAY AS NEEDED FOR CONSTIPATION. 527 g 11  . Probiotic Product (PROBIOTIC DAILY PO) Take 1 tablet by mouth every morning. Florify    . RABEprazole (ACIPHEX) 20 MG tablet Take 1 tablet (20 mg total) by mouth 2 (two) times daily before a meal. (Patient taking differently: Take 20 mg by mouth daily. ) 60 tablet 5  . RESTASIS 0.05 % ophthalmic emulsion Place 1 drop into both eyes 2 (two) times daily.     . risperiDONE (RISPERDAL) 1  MG tablet Take 0.5 mg by mouth at bedtime.     . simvastatin (ZOCOR) 20 MG tablet TAKE (1) TABLET BY MOUTH AT BEDTIME. 90 tablet 3  . WELLBUTRIN XL 300 MG 24 hr tablet Take 300 mg by mouth daily.     Marland Kitchen zolpidem (AMBIEN) 10 MG tablet Take 10 mg by mouth at bedtime as needed for sleep.      No current facility-administered medications for this visit.    Discontinued Meds:   There are no discontinued medications.  Patient Active Problem List   Diagnosis Date Noted  . Midsternal chest pain 08/19/2015  . Bipolar affective disorder ([place])   . Hypothyroidism 08/04/2015    Class: Diagnosis of  . Chronic cough 11/23/2014  . Fecal incontinence 11/23/2014  . Constipation 09/22/2013  . Hx of adenomatous colonic polyps 09/22/2013  . S/P knee replacement 04/27/2013  . Knee pain 04/27/2013  . Muscle weakness (generalized) 04/27/2013  . Hypertension 10/15/2011  . Status post THR (total hip replacement) 07/26/2011  . Hip pain 07/26/2011  . Difficulty  in walking(719.7) 07/26/2011  . Hyperlipidemia 05/11/2009  . BIPOLAR AFFECTIVE DISORDER 05/11/2009  . Coronary atherosclerosis 05/11/2009  . GERD 05/11/2009  . Fibromyalgia 05/11/2009  . DIZZINESS 05/11/2009  . Chest pain 05/11/2009    LABS    Component Value Date/Time   NA 132* 08/18/2015 1651   NA 137 07/27/2013 1708   NA 135 11/28/2012 1258   K 4.0 08/18/2015 1651   K 4.5 07/27/2013 1708   K 3.9 11/28/2012 1258   CL 96* 08/18/2015 1651   CL 101 07/27/2013 1708   CL 97 11/28/2012 1258   CO2 26 08/18/2015 1651   CO2 31 07/27/2013 1708   CO2 29 11/28/2012 1258   GLUCOSE 102* 08/18/2015 1651   GLUCOSE 91 07/27/2013 1708   GLUCOSE 164* 11/28/2012 1258   BUN 9 08/18/2015 1651   BUN 15 07/27/2013 1708   BUN 7 11/28/2012 1258   CREATININE 0.96 08/18/2015 1651   CREATININE 0.70 07/27/2013 1708   CREATININE 0.62 11/28/2012 1258   CREATININE 0.8 11/21/2009 0954   CALCIUM 8.6* 08/18/2015 1651   CALCIUM 9.7 07/27/2013 1708   CALCIUM 9.6 11/28/2012 1258   GFRNONAA 58* 08/18/2015 1651   GFRNONAA 90* 11/28/2012 1258   GFRNONAA 76.26 11/21/2009 0954   GFRAA >60 08/18/2015 1651   GFRAA >90 11/28/2012 1258   GFRAA  12/28/2008 0458    >60        The eGFR has been calculated using the MDRD equation. This calculation has not been validated in all clinical situations. eGFR's persistently <60 mL/min signify possible Chronic Kidney Disease.   CMP     Component Value Date/Time   NA 132* 08/18/2015 1651   K 4.0 08/18/2015 1651   CL 96* 08/18/2015 1651   CO2 26 08/18/2015 1651   GLUCOSE 102* 08/18/2015 1651   BUN 9 08/18/2015 1651   CREATININE 0.96 08/18/2015 1651   CREATININE 0.70 07/27/2013 1708   CALCIUM 8.6* 08/18/2015 1651   PROT 6.4 07/27/2013 1708   ALBUMIN 4.3 07/27/2013 1708   AST 23 07/27/2013 1708   ALT 20 07/27/2013 1708   ALKPHOS 72 07/27/2013 1708   BILITOT 0.2* 07/27/2013 1708   GFRNONAA 58* 08/18/2015 1651   GFRAA >60 08/18/2015 1651       Component  Value Date/Time   WBC 7.8 08/18/2015 1651   WBC 10.3 11/28/2012 1258   WBC 6.3 12/28/2008 0458   HGB 12.1 08/18/2015 1651  HGB 12.7 11/28/2012 1258   HGB 12.2 12/28/2008 0458   HCT 37.3 08/18/2015 1651   HCT 38.2 11/28/2012 1258   HCT 34.7* 12/28/2008 0458   MCV 95.6 08/18/2015 1651   MCV 94.3 11/28/2012 1258   MCV 102.7* 12/28/2008 0458    Lipid Panel     Component Value Date/Time   CHOL 150 07/27/2013 1708   TRIG 140 07/27/2013 1708   HDL 53 07/27/2013 1708   CHOLHDL 2.8 07/27/2013 1708   VLDL 28 07/27/2013 1708   LDLCALC 69 07/27/2013 1708    ABG No results found for: PHART, PCO2ART, PO2ART, HCO3, TCO2, ACIDBASEDEF, O2SAT   Lab Results  Component Value Date   TSH 2.577 08/04/2015   BNP (last 3 results) No results for input(s): BNP in the last 8760 hours.  ProBNP (last 3 results) No results for input(s): PROBNP in the last 8760 hours.  Cardiac Panel (last 3 results) No results for input(s): CKTOTAL, CKMB, TROPONINI, RELINDX in the last 72 hours.  Iron/TIBC/Ferritin/ %Sat No results found for: IRON, TIBC, FERRITIN, IRONPCTSAT   EKG Orders placed or performed during the hospital encounter of 08/18/15  . ED EKG within 10 minutes  . ED EKG within 10 minutes  . EKG 12-Lead  . EKG 12-Lead  . EKG 12-Lead (at 6am)  . EKG 12-Lead (at 6am)  . EKG 12-Lead  . EKG 12-Lead  . EKG     Prior Assessment and Plan Problem List as of 09/06/2015      Cardiovascular and Mediastinum   Coronary atherosclerosis   Last Assessment & Plan 08/05/2013 Office Visit Written 08/05/2013  2:50 PM by Imogene Burn, PA-C    Stable without chest pain      Hypertension   Last Assessment & Plan 08/05/2013 Office Visit Written 08/05/2013  2:50 PM by Imogene Burn, PA-C    Blood pressure controlled        Digestive   GERD   Last Assessment & Plan 01/18/2014 Office Visit Written 01/19/2014 10:26 PM by Mahala Menghini, PA-C    Continues to have refractory GERD. Did not tolerate Dexilant.  Refractory to omeprazole and pantoprazole. EGD in near future. Discussed conscious versus deep sedation. She tolerated conscious sedation in past with polypharmacy. Plan to augment conscious sedation with phenergan 33m IV 30 minutes before.  I have discussed the risks, alternatives, benefits with regards to but not limited to the risk of reaction to medication, bleeding, infection, perforation and the patient is agreeable to proceed. Written consent to be obtained.       Constipation   Last Assessment & Plan 01/18/2014 Office Visit Written 01/19/2014 10:28 PM by LMahala Menghini PA-C    Miralax effective when she remembers to take it. TCS as planned.        Endocrine   Hypothyroidism     Musculoskeletal and Integument   Fibromyalgia     Other   Hyperlipidemia   Last Assessment & Plan 08/05/2013 Office Visit Edited 08/05/2013  2:51 PM by MImogene Burn PA-C    Recent lipid profile is excellent. She like to cut back on her Zocor. I think this is reasonable as she is also taking niacin. Will decrease her Zocor to 40 mg one half tab daily. Fasting lipid panel in 6 months.      BIPOLAR AFFECTIVE DISORDER   DIZZINESS   Chest pain   Status post THR (total hip replacement)   Hip pain   Difficulty in walking(719.7)  S/P knee replacement   Knee pain   Muscle weakness (generalized)   Hx of adenomatous colonic polyps   Last Assessment & Plan 01/18/2014 Office Visit Written 01/19/2014 10:27 PM by Mahala Menghini, PA-C    Due for surveillance colonoscopy. Augment conscious sedation with phenergan 32m IV 30 mins before due to polypharmacy.  I have discussed the risks, alternatives, benefits with regards to but not limited to the risk of reaction to medication, bleeding, infection, perforation and the patient is agreeable to proceed. Written consent to be obtained.       Chronic cough   Fecal incontinence   Midsternal chest pain   Bipolar affective disorder ([facility]       Imaging: Dg Chest Port  1 View  08/18/2015  CLINICAL DATA:  Left-sided chest pain radiating down the left arm. Shortness of breath, nausea EXAM: PORTABLE CHEST 1 VIEW COMPARISON:  11/28/2012 FINDINGS: There is no focal parenchymal opacity. There is no pleural effusion or pneumothorax. The heart and mediastinal contours are unremarkable. There is severe osteoarthritis of the right glenohumeral joint. There is a left shoulder arthroplasty. IMPRESSION: No active disease. Electronically Signed   By: HKathreen Devoid  On: 08/18/2015 17:29

## 2015-09-06 NOTE — Progress Notes (Signed)
Cardiology Office Note   Date:  09/06/2015   ID:  [name] [name], DOB 23-Jan-1941, MRN [id number]  PCP:  Alonza Bogus, MD  Cardiologist:  Ross/ Jory Sims, NP   No chief complaint on file.     History of Present Illness: [name] [name] is a 74 y.o. female who presents for post hospitalization follow up after admission for chest pain with cardiac cath. Other history includes fibromyalgia, hypertension and hyperlipidemia. Cardiac cath demonstrated non-obstructive CAD with normal LV fx. She was continued on ASA and statin therapy.   She is still having some nausea and chest pressure since leaving the hospital. She has not been seen by her PCP since having the cardiac cath. Still has gallbladder.     Past Medical History  Diagnosis Date  . Non-obstructive CAD     a. 08/2015 Cath: LM 40, LCX small, 30ost, 66m, OM2 small, RCA nl/small, EF 55-65%.  . Dizziness   . Dyslipidemia   . GERD (gastroesophageal reflux disease)   . Bipolar affective disorder ([place])   . Fibromyalgia   . H/O: hysterectomy   . Hyperlipidemia   . Arthritis   . Chronic fatigue   . Hypertension   . Hx of adenomatous colonic polyps 2005  . Osteopenia   . Chronic back pain   . Sciatica   . Knee pain     Past Surgical History  Procedure Laterality Date  . Colonoscopy  01/29/2007    HAL:[id number] rectum, left-sided diverticula, 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  Remainder of colonic mucosa was normal  . [place] bunionectomy      bilateral  . Esophagogastroduodenoscopy      followed by colonoscopy with snare polypectomy  . Cystectomy      pilonidial cyst  . Breast lumpectomy    . Spinal fusion    . Right knee replacement  02/2013  . Breast biopsy Left 1989  . Shoulder arthroscopy Left 2005  . Bunionectomy Left 2005  . Spinal fusion  2004    L4-L5  . Total hip arthroplasty Right 06/2006  . Total shoulder replacement Left 08/2005  . Total hip arthroplasty  8/12  . Abdominal  hysterectomy  1994    TAH- DUB, BSO  . Shoulder arthroscopy Right   . Colonoscopy with esophagogastroduodenoscopy (egd) N/A 02/01/2014    IOX:BDZH erosive reflux/gastric polyp/normal rectum/scattered left sided diverticula, normal distal TI. gastric bx negative, fundic gland polyp. next TCS 01/2019.  Marland Kitchen Cardiac catheterization  2010    +CAD  . Cardiac catheterization N/A 08/19/2015    Procedure: Left Heart Cath and Coronary Angiography;  Surgeon: Belva Crome, MD;  Location: [facility];  Service: Cardiovascular;  Laterality: N/A;     Current Outpatient Prescriptions  Medication Sig Dispense Refill  . ALPRAZolam (XANAX) 0.5 MG tablet Take 1 tablet by mouth daily as needed. anxiety    . aspirin 81 MG tablet Take 81 mg by mouth daily.    . Cholecalciferol (VITAMIN D PO) Take 2,000 Int'l Units by mouth daily.    Marland Kitchen docusate sodium (COLACE) 100 MG capsule Take 100 mg by mouth daily as needed for mild constipation or moderate constipation.     . DULoxetine (CYMBALTA) 60 MG capsule Take 120 mg by mouth daily.     Marland Kitchen estradiol (ESTRACE) 1 MG tablet Take 0.5 tablets (0.5 mg total) by mouth daily. Take 1/2 tablet po daily 30 tablet 6  . lamoTRIgine (LAMICTAL) 150 MG tablet Take 150 mg by mouth  at bedtime.    Marland Kitchen levothyroxine (LEVOTHROID) 25 MCG tablet Take 1 tablet (25 mcg total) by mouth daily. 30 tablet 12  . lidocaine (LIDODERM) 5 % Place 1-3 patches onto the skin as needed. Remove & Discard patch within 12 hours or as directed by MD    . losartan (COZAAR) 50 MG tablet Take 1 tablet (50 mg total) by mouth daily. 90 tablet 3  . meloxicam (MOBIC) 7.5 MG tablet Take 7.5 mg by mouth 2 (two) times daily.     . Multiple Vitamin (MULTIVITAMIN) tablet Take 1 tablet by mouth daily.      . niacin (SLO-NIACIN) 500 MG tablet Take 500 mg by mouth daily.     Marland Kitchen NITROSTAT 0.4 MG SL tablet PLACE 1 TAB UNDER TONGUE EVERY 5 MIN IF NEEDED FOR CHEST PAIN. MAY USE 3 TIMES.NO RELIEF CALL 911. 25 tablet 0  . Omega-3  Fatty Acids (FISH OIL) 1000 MG CAPS Take 1 capsule by mouth daily.     Marland Kitchen oxyCODONE (OXY IR/ROXICODONE) 5 MG immediate release tablet Take 5 mg by mouth every 4 (four) hours as needed for severe pain.    . polyethylene glycol powder (GLYCOLAX/MIRALAX) powder MIX ONE CAPFUL (17GM) IN 8 OZ. OF FLUID ONCE A DAY AS NEEDED FOR CONSTIPATION. 527 g 11  . Probiotic Product (PROBIOTIC DAILY PO) Take 1 tablet by mouth every morning. Florify    . RABEprazole (ACIPHEX) 20 MG tablet Take 1 tablet (20 mg total) by mouth 2 (two) times daily before a meal. (Patient taking differently: Take 20 mg by mouth daily. ) 60 tablet 5  . RESTASIS 0.05 % ophthalmic emulsion Place 1 drop into both eyes 2 (two) times daily.     . risperiDONE (RISPERDAL) 1 MG tablet Take 0.5 mg by mouth at bedtime.     . simvastatin (ZOCOR) 20 MG tablet TAKE (1) TABLET BY MOUTH AT BEDTIME. 90 tablet 3  . WELLBUTRIN XL 300 MG 24 hr tablet Take 300 mg by mouth daily.     Marland Kitchen zolpidem (AMBIEN) 10 MG tablet Take 10 mg by mouth at bedtime as needed for sleep.      No current facility-administered medications for this visit.    Allergies:   Hydrocodone-acetaminophen and Vicodin    Social History:  The patient  reports that she has never smoked. She has never used smokeless tobacco. She reports that she drinks about 3.5 oz of alcohol per week. She reports that she does not use illicit drugs.   Family History:  The patient's family history includes Cancer in her paternal grandmother; Heart attack in her father and mother; Heart disease in her father and mother; Stroke in her maternal grandfather and sister. There is no history of Colon cancer.    ROS: All other systems are reviewed and negative. Unless otherwise mentioned in H&P    PHYSICAL EXAM: VS:  Ht 4\' 9"  (1.448 m)  Wt 137 lb (62.143 kg)  BMI 29.64 kg/m2  LMP 02/10/1993 , BMI Body mass index is 29.64 kg/(m^2). GEN: Well nourished, well developed, in no acute distress HEENT: normal Neck:  no JVD, carotid bruits, or masses Cardiac: RRR; no murmurs, rubs, or gallops,no edema  Respiratory:  clear to auscultation bilaterally, normal work of breathing GI: soft, nontender, nondistended, + BS MS: no deformity or atrophy Skin: warm and dry, no rash Neuro:  Strength and sensation are intact Psych: euthymic mood, full affect   Recent Labs: 08/04/2015: TSH 2.577 08/18/2015: BUN 9; Creatinine, Ser 0.96;  Hemoglobin 12.1; Platelets 304; Potassium 4.0; Sodium 132*    Lipid Panel    Component Value Date/Time   CHOL 150 07/27/2013 1708   TRIG 140 07/27/2013 1708   HDL 53 07/27/2013 1708   CHOLHDL 2.8 07/27/2013 1708   VLDL 28 07/27/2013 1708   LDLCALC 69 07/27/2013 1708      Wt Readings from Last 3 Encounters:  09/06/15 137 lb (62.143 kg)  08/18/15 136 lb 8 oz (61.916 kg)  08/04/15 137 lb (62.143 kg)     ASSESSMENT AND PLAN:  1. Chest pain: Non-cardiac. Cath demonstrated non-obstructive disease. She is still having some nausea and chest pressure. She is a little sore in the RUQ but not true Murphy's sign. Would check GB ultrasound or possible HIDA at PCP discretion. She should continue ASA and statin as directed. Will see her PRN.   2. Hypertension: BP is well controlled. HR is up,but she drinks a lot of caffeine. She is to cut back to assit with better control and cut down on GI side effects.    Current medicines are reviewed at length with the patient today.    Labs/ tests ordered today include: None No orders of the defined types were placed in this encounter.     Disposition:   FU with PRN Signed, Jory Sims, NP  09/06/2015 1:31 PM    [facility] [address] [place], [place] [address] Phone: [phone]; Fax: [phone]

## 2015-09-06 NOTE — Patient Instructions (Signed)
Your physician recommends that you schedule a follow-up appointment in: As Needed  Your physician recommends that you continue on your current medications as directed. Please refer to the Current Medication list given to you today.  If you need a refill on your cardiac medications before your next appointment, please call your pharmacy.  Thank you for choosing [facility]!

## 2015-09-09 ENCOUNTER — Other Ambulatory Visit: Payer: Self-pay | Admitting: Internal Medicine

## 2015-09-19 ENCOUNTER — Other Ambulatory Visit (HOSPITAL_COMMUNITY): Payer: Self-pay | Admitting: Pulmonary Disease

## 2015-09-19 DIAGNOSIS — R109 Unspecified abdominal pain: Secondary | ICD-10-CM

## 2015-09-26 ENCOUNTER — Ambulatory Visit (HOSPITAL_COMMUNITY)
Admission: RE | Admit: 2015-09-26 | Discharge: 2015-09-26 | Disposition: A | Discharge status: Home / Self Care | Payer: Medicare Other | Source: Ambulatory Visit | Attending: Pulmonary Disease | Admitting: Pulmonary Disease

## 2015-09-26 DIAGNOSIS — R109 Unspecified abdominal pain: Secondary | ICD-10-CM | POA: Diagnosis present

## 2015-10-12 ENCOUNTER — Other Ambulatory Visit: Payer: Self-pay | Admitting: Gastroenterology

## 2015-12-06 ENCOUNTER — Other Ambulatory Visit: Payer: Self-pay | Admitting: Internal Medicine

## 2016-01-18 NOTE — Therapy (Signed)
[facility] [place], [place], [address] Phone: [phone]   Fax:  [phone]  Patient Details  Name: [name] [name] MRN: [id number] Date of Birth: 02-28-1941 Referring Provider:  Laurance Flatten, MD  Encounter Date: 01/18/2016  PHYSICAL THERAPY DISCHARGE SUMMARY  Visits from Start of Care: 11  Current functional level related to goals / functional outcomes: Patient requested to be discharged from skilled PT services, has not returned since last skilled visit    Remaining deficits: Unable to assess    Education / Equipment: N/a  Plan: Patient agrees to discharge.  Patient goals were not met. Patient is being discharged due to not returning since the last visit.  ?????       Deniece Ree PT, DPT [facility] [address] [place], [place], [address] Phone: [phone]   Fax:  [phone]

## 2016-03-02 ENCOUNTER — Other Ambulatory Visit: Payer: Self-pay | Admitting: Internal Medicine

## 2016-03-05 NOTE — Telephone Encounter (Signed)
Please advise on refill request as there is no recent lipid panel in epic. Thanks, MI

## 2016-03-06 NOTE — Telephone Encounter (Signed)
Spoke with patient. She reports having lipids and liver function checked recently by her PCP Dr. Luan Pulling. [phone]) I left a message for medical records dept to fax labs.  Med refilled 90 days for 1 year.

## 2016-03-08 ENCOUNTER — Encounter: Payer: Self-pay | Admitting: Internal Medicine

## 2016-03-09 ENCOUNTER — Telehealth: Payer: Self-pay

## 2016-03-09 DIAGNOSIS — E785 Hyperlipidemia, unspecified: Secondary | ICD-10-CM

## 2016-03-09 MED ORDER — ATORVASTATIN CALCIUM 40 MG PO TABS: 40.0000 mg | ORAL_TABLET | Freq: Every day | ORAL | Status: DC

## 2016-03-09 NOTE — Telephone Encounter (Signed)
-----   Message from Dorris Carnes V, MD sent at 03/08/2016  6:23 PM EDT ----- LDL is 96   I would recomm switching to Lipitor 40 to get tighter control of LDL with known CAD Goal 70   Check lipid panel in 8 wks with AST

## 2016-03-09 NOTE — Telephone Encounter (Signed)
Pt will change from zocor to lipitor,mailed lab slip

## 2016-04-05 ENCOUNTER — Ambulatory Visit (INDEPENDENT_AMBULATORY_CARE_PROVIDER_SITE_OTHER): Payer: Medicare Other | Admitting: Otolaryngology

## 2016-04-05 DIAGNOSIS — H903 Sensorineural hearing loss, bilateral: Secondary | ICD-10-CM | POA: Diagnosis not present

## 2016-05-09 LAB — LIPID PANEL
Cholesterol: 160 mg/dL (ref 125–200)
HDL: 86 mg/dL (ref >=46)
LDL Cholesterol: 60 mg/dL (ref <130)
Total CHOL/HDL Ratio: 1.9 Ratio (ref <=5.0)
Triglycerides: 72 mg/dL (ref <150)
VLDL: 14 mg/dL (ref <30)

## 2016-05-09 LAB — AST: AST: 29 U/L (ref 10–35)

## 2016-06-02 ENCOUNTER — Other Ambulatory Visit: Payer: Self-pay | Admitting: Internal Medicine

## 2016-08-07 ENCOUNTER — Encounter: Payer: Self-pay | Admitting: Certified Nurse Midwife

## 2016-08-07 ENCOUNTER — Ambulatory Visit (INDEPENDENT_AMBULATORY_CARE_PROVIDER_SITE_OTHER): Payer: Medicare Other | Admitting: Certified Nurse Midwife

## 2016-08-07 VITALS — BP 120/70 | HR 70 | Resp 16 | Ht <= 58 in | Wt 129.0 lb

## 2016-08-07 DIAGNOSIS — Z01419 Encounter for gynecological examination (general) (routine) without abnormal findings: Secondary | ICD-10-CM

## 2016-08-07 DIAGNOSIS — N951 Menopausal and female climacteric states: Secondary | ICD-10-CM | POA: Diagnosis not present

## 2016-08-07 DIAGNOSIS — Z0000 Encounter for general adult medical examination without abnormal findings: Secondary | ICD-10-CM | POA: Diagnosis not present

## 2016-08-07 DIAGNOSIS — R159 Full incontinence of feces: Secondary | ICD-10-CM

## 2016-08-07 DIAGNOSIS — N644 Mastodynia: Secondary | ICD-10-CM | POA: Diagnosis not present

## 2016-08-07 DIAGNOSIS — N816 Rectocele: Secondary | ICD-10-CM

## 2016-08-07 LAB — POCT URINALYSIS DIPSTICK
Bilirubin, UA: NEGATIVE
Blood, UA: NEGATIVE
Glucose, UA: NEGATIVE
Ketones, UA: NEGATIVE
Leukocytes, UA: NEGATIVE
Nitrite, UA: NEGATIVE
Protein, UA: NEGATIVE
Urobilinogen, UA: NEGATIVE
pH, UA: 5

## 2016-08-07 NOTE — Progress Notes (Signed)
75 y.o. G16P2002 Widowed  Caucasian Fe here for annual exam. Menopausal no HRT. Denies vaginal bleeding, or vaginal dryness. Complaining of some stool leakage and occurs more after constipation. Patient taking a stool softener also at times. Has felt she maybe a little lactose intolerant and has started on Lactaid products. Patient complaining of left breast tenderness for the past month. Describes as " hurts in the area of scar from lumpectomy  Previously. Denies any nipple discharge or skin change. Does SBE. No new bra or trauma to area. Sees PCP aex/ labs and medication management for hypertension, hypothyroid.[place] follows bone density. Patient feels she has slightly more balance problems in the past year, but no falls. Takes her time when ambulating. No other health concerns today. Drove [place] to see her sister by herself!  Patient's last menstrual period was 02/10/1993.          Sexually active: No.  The current method of family planning is status post hysterectomy.    Exercising: No.  exercise Smoker:  no  Health Maintenance: Pap:  07-06-02 neg MMG:  08-16-15 category b density birads 1:neg,  scheduled Colonoscopy:  2015 f/u 8yrs BMD:   2015 osteopenia PCP follows TDaP:  2014 Shingles: had done Pneumonia: had done Hep C and HIV: not done declines today Labs: poct urine-neg Self breast exam: done occ   reports that she has never smoked. She has never used smokeless tobacco. She reports that she drinks about 3.5 oz of alcohol per week . She reports that she does not use drugs.  Past Medical History:  Diagnosis Date  . Arthritis   . Bipolar affective disorder ([place])   . Chronic back pain   . Chronic fatigue   . Dizziness   . Dyslipidemia   . Fibromyalgia   . GERD (gastroesophageal reflux disease)   . H/O: hysterectomy   . Hx of adenomatous colonic polyps 2005  . Hyperlipidemia   . Hypertension   . Knee pain   . Non-obstructive CAD    a. 08/2015 Cath: LM 40,  LCX small, 30ost, 72m, OM2 small, RCA nl/small, EF 55-65%.  . Osteopenia   . Sciatica     Past Surgical History:  Procedure Laterality Date  . ABDOMINAL HYSTERECTOMY  1994   TAH- DUB, BSO  . [place] bunionectomy     bilateral  . BREAST BIOPSY Left 1989  . BREAST LUMPECTOMY    . BUNIONECTOMY Left 2005  . CARDIAC CATHETERIZATION  2010   +CAD  . CARDIAC CATHETERIZATION N/A 08/19/2015   Procedure: Left Heart Cath and Coronary Angiography;  Surgeon: Belva Crome, MD;  Location: [facility];  Service: Cardiovascular;  Laterality: N/A;  . 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  Remainder of colonic mucosa was normal  . COLONOSCOPY WITH ESOPHAGOGASTRODUODENOSCOPY (EGD) N/A 02/01/2014   [id number] erosive reflux/gastric polyp/normal rectum/scattered left sided diverticula, normal distal TI. gastric bx negative, fundic gland polyp. next TCS 01/2019.  Marland Kitchen CYSTECTOMY     pilonidial cyst  . ESOPHAGOGASTRODUODENOSCOPY     followed by colonoscopy with snare polypectomy  . right knee replacement  02/2013  . SHOULDER ARTHROSCOPY Left 2005  . SHOULDER ARTHROSCOPY Right   . SPINAL FUSION    . SPINAL FUSION  2004   L4-L5  . TOTAL HIP ARTHROPLASTY Right 06/2006  . TOTAL HIP ARTHROPLASTY  8/12  . TOTAL SHOULDER REPLACEMENT Left 08/2005    Current Outpatient Prescriptions  Medication Sig Dispense Refill  . ALPRAZolam (XANAX) 0.5 MG tablet Take 1 tablet by mouth daily as needed. anxiety    . aspirin 81 MG tablet Take 81 mg by mouth daily.    Marland Kitchen atorvastatin (LIPITOR) 40 MG tablet Take 1 tablet (40 mg total) by mouth daily. 90 tablet 3  . Cholecalciferol (VITAMIN D PO) Take 2,000 Int'l Units by mouth daily.    Marland Kitchen docusate sodium (COLACE) 100 MG capsule Take 100 mg by mouth daily as needed for mild constipation or moderate constipation.     . DULoxetine (CYMBALTA) 60 MG capsule Take 120 mg by mouth daily.     Marland Kitchen  estradiol (ESTRACE) 1 MG tablet Take 0.5 tablets (0.5 mg total) by mouth daily. Take 1/2 tablet po daily 30 tablet 6  . lamoTRIgine (LAMICTAL) 150 MG tablet Take 150 mg by mouth at bedtime.    Marland Kitchen levothyroxine (LEVOTHROID) 25 MCG tablet Take 1 tablet (25 mcg total) by mouth daily. 30 tablet 12  . lidocaine (LIDODERM) 5 % Place 1-3 patches onto the skin as needed. Remove & Discard patch within 12 hours or as directed by MD    . losartan (COZAAR) 50 MG tablet TAKE ONE TABLET BY MOUTH ONCE DAILY. 90 tablet 0  . meloxicam (MOBIC) 7.5 MG tablet Take 7.5 mg by mouth 2 (two) times daily.     . Multiple Vitamin (MULTIVITAMIN) tablet Take 1 tablet by mouth daily.      . niacin (SLO-NIACIN) 500 MG tablet Take 500 mg by mouth daily.     Marland Kitchen NITROSTAT 0.4 MG SL tablet PLACE 1 TAB UNDER TONGUE EVERY 5 MIN IF NEEDED FOR CHEST PAIN. MAY USE 3 TIMES.NO RELIEF CALL 911. 25 tablet 0  . Omega-3 Fatty Acids (FISH OIL) 1000 MG CAPS Take 1 capsule by mouth daily.     Marland Kitchen oxyCODONE (OXY IR/ROXICODONE) 5 MG immediate release tablet Take 5 mg by mouth every 4 (four) hours as needed for severe pain.    . polyethylene glycol powder (GLYCOLAX/MIRALAX) powder MIX ONE CAPFUL (17GM) IN 8 OZ. OF FLUID ONCE A DAY AS NEEDED FOR CONSTIPATION. 527 g 11  . Probiotic Product (PROBIOTIC DAILY PO) Take 1 tablet by mouth every morning. Florify    . RABEprazole (ACIPHEX) 20 MG tablet TAKE ONE TABLET BY MOUTH TWICE DAILY BEFORE A MEAL. 60 tablet 5  . RESTASIS 0.05 % ophthalmic emulsion Place 1 drop into both eyes 2 (two) times daily.     . risperiDONE (RISPERDAL) 1 MG tablet Take 0.5 mg by mouth at bedtime.     . WELLBUTRIN XL 300 MG 24 hr tablet Take 300 mg by mouth daily.     Marland Kitchen zolpidem (AMBIEN) 10 MG tablet Take 10 mg by mouth at bedtime as needed for sleep.      No current facility-administered medications for this visit.     Family History  Problem Relation Age of Onset  . Heart disease Mother   . Heart attack Mother   . Heart  disease Father   . Heart attack Father   . Stroke Sister   . Stroke Maternal Grandfather   . Cancer Paternal Grandmother     unknown primary  . Colon cancer Neg Hx     ROS:  Pertinent items are noted in HPI.  Otherwise, a comprehensive ROS was negative.  Exam:   LMP 02/10/1993    Ht Readings from Last 3 Encounters:  09/06/15 4\' 9"  (1.448 m)  08/18/15 4\' 9"  (1.448  m)  08/04/15 4' 8.5" (1.435 m)    General appearance: alert, cooperative and appears stated age Head: Normocephalic, without obvious abnormality, atraumatic Neck: no adenopathy, supple, symmetrical, trachea midline and thyroid normal to inspection and palpation Lungs: clear to auscultation bilaterally Breasts: normal appearance, no masses or tenderness, No nipple retraction or dimpling, No nipple discharge or bleeding, No axillary or supraclavicular adenopathy, left breast tenderness on LOQ area, previous scar from surgery years ago. Heart: regular rate and rhythm Abdomen: soft, non-tender; no masses,  no organomegaly Extremities: extremities normal, atraumatic, no cyanosis or edema Skin: Skin color, texture, turgor normal. No rashes or lesions Lymph nodes: Cervical, supraclavicular, and axillary nodes normal. No abnormal inguinal nodes palpated Neurologic: Grossly normal   Pelvic: External genitalia:  no lesions              Urethra:  normal appearing urethra with no masses, tenderness or lesions              Bartholin's and Skene's: normal                 Vagina: normal appearing vagina with normal color and discharge, no lesions, mild rectocele noted, no stool palpated              Cervix: absent              Pap taken: No. Bimanual Exam:  Uterus:  uterus absent              Adnexa: no mass, fullness, tenderness               Rectovaginal: Confirms               Anus:  normal sphincter tone, no lesions  Chaperone present: yes  A:  Well Woman with normal exam  TAH with ovaries removed due DUB  Left breast   Tenderness  Rectocele grade 1 with occasional stool incontinence  Hypothyroid/hypertension,cholesterol management with MD  P:   Reviewed health and wellness pertinent to exam  Discussed finding of breast tenderness also and recommend diagnostic mammogram with [place] patient agreeable. Patient will be scheduled prior to leaving.  Discussed finding of rectocele which can inhibit complete emptying of bowel at times. Discussed etiology and questions addressed. Discussed stool softener use to prevent constipation and hard stools. Patient has at home and will try. Will advise if continues or increases.  Continue follow up with MD as indicated.  Pap smear as above not taken   counseled on breast self exam, mammography screening, adequate intake of calcium and vitamin D, diet and exercise, Kegel's exercises  return annually or prn  An After Visit Summary was printed and given to the patient.

## 2016-08-07 NOTE — Patient Instructions (Signed)

## 2016-08-07 NOTE — Progress Notes (Signed)
Scheduled patient while in office for bilateral diagnostic mammogram with left breast ultrasound at [facility] on 08/21/2016 at 9:15 am.  Patient is agreeable to date and time. Placed in mammogram hold.

## 2016-08-10 NOTE — Progress Notes (Signed)
Encounter reviewed Floria Brandau, MD   

## 2016-08-27 ENCOUNTER — Telehealth: Payer: Self-pay

## 2016-08-27 NOTE — Telephone Encounter (Signed)
Spoke with patient regarding left breast tenderness. Patient was seen for left breast diagnostic mammogram with ultrasound on 08/21/2016 at [facility]. There was no evidence of malignancy and it is recommended that she return to yearly screening mammograms at this time. Per patient she is still having left breast tenderness. 6 week recheck scheduled for 09/18/2016 at 1:45 pm with Melvia Heaps CNM. Paper report to [place] for review and advise regarding mammogram hold.

## 2016-08-27 NOTE — Telephone Encounter (Signed)
OK to remove from MMG hold.  Have signed mmg report and will return to you.  OK to close encounter.

## 2016-08-28 NOTE — Telephone Encounter (Signed)
Patient removed from mammogram hold. Will close encounter.

## 2016-08-30 ENCOUNTER — Encounter: Payer: Self-pay | Admitting: Certified Nurse Midwife

## 2016-09-18 ENCOUNTER — Ambulatory Visit (INDEPENDENT_AMBULATORY_CARE_PROVIDER_SITE_OTHER): Payer: Medicare Other | Admitting: Certified Nurse Midwife

## 2016-09-18 ENCOUNTER — Encounter: Payer: Self-pay | Admitting: Certified Nurse Midwife

## 2016-09-18 VITALS — BP 120/80 | HR 74 | Resp 16 | Ht <= 58 in | Wt 131.0 lb

## 2016-09-18 DIAGNOSIS — N644 Mastodynia: Secondary | ICD-10-CM | POA: Diagnosis not present

## 2016-09-18 NOTE — Progress Notes (Signed)
   Subjective:   75 y.o. Widowed Caucasian female presents for follow up of left breast tenderness.  Mammogram and [place] of area showed no masses or abnormal finding. Patient notices tenderness sporadically and has decided it may just be normal for her with age changes. Denies any nipple discharge or skin change since last visit bilaterally. No other health issues today.  Review of Systems Pertinent items are noted in HPI.   Objective:   General appearance: alert, cooperative and appears stated age Breasts: normal appearance, no masses or tenderness, No nipple retraction or dimpling, No nipple discharge or bleeding, No axillary or supraclavicular adenopathy, no tenderness noted with palpation  bilateral,      Assessment:   ASSESSMENT:Patient is diagnosed with normal breast exam   Plan:   PLAN: Discussed normal breast findings on exam and no tenderness. Reviewed mammogram and [place] and no abnormalities noted. Discussed changing bras and notify if she notices a change in what she normally feels.  Patient agreeable and reassured. Routine mammogram screening schedule.

## 2016-09-18 NOTE — Patient Instructions (Signed)

## 2016-09-19 NOTE — Progress Notes (Signed)
Encounter reviewed Krishang Reading, MD   

## 2016-10-25 ENCOUNTER — Other Ambulatory Visit: Payer: Self-pay | Admitting: Certified Nurse Midwife

## 2016-10-25 DIAGNOSIS — E039 Hypothyroidism, unspecified: Secondary | ICD-10-CM

## 2016-10-25 NOTE — Telephone Encounter (Signed)
Medication refill request: synthroid  Last AEX:  08/07/16 DL Next AEX: 08/08/17 DL  Last MMG (if hormonal medication request): 08/21/16 [id number]:Neg  Refill authorized: 08/05/15 #[id number]. Today please advise.

## 2016-12-04 ENCOUNTER — Other Ambulatory Visit: Payer: Self-pay | Admitting: Internal Medicine

## 2017-03-06 ENCOUNTER — Other Ambulatory Visit: Payer: Self-pay | Admitting: Internal Medicine

## 2017-04-04 ENCOUNTER — Ambulatory Visit (INDEPENDENT_AMBULATORY_CARE_PROVIDER_SITE_OTHER): Payer: Medicare Other | Admitting: Otolaryngology

## 2017-04-04 DIAGNOSIS — H903 Sensorineural hearing loss, bilateral: Secondary | ICD-10-CM | POA: Diagnosis not present

## 2017-05-30 ENCOUNTER — Other Ambulatory Visit: Payer: Self-pay | Admitting: Internal Medicine

## 2017-06-03 ENCOUNTER — Telehealth: Payer: Self-pay | Admitting: Internal Medicine

## 2017-06-03 MED ORDER — ATORVASTATIN CALCIUM 40 MG PO TABS: ORAL_TABLET | ORAL | 0 refills | Status: DC

## 2017-06-03 NOTE — Telephone Encounter (Signed)
Pt's medication was sent to pt's pharmacy as requested. Confirmation received.  °

## 2017-06-03 NOTE — Telephone Encounter (Signed)
New message    Has appt on 8/8   *STAT* If patient is at the pharmacy, call can be transferred to refill team.   1. Which medications need to be refilled? (please list name of each medication and dose if known) torvastatin (LIPITOR) 40 MG tablet TAKE (1) TABLET BY MOUTH AT BEDTIME.    2. Which pharmacy/location (including street and city if local pharmacy) is medication to be sent to?[facility] [place]   3. Do they need a 30 day or 90 day supply? [place]

## 2017-06-19 ENCOUNTER — Ambulatory Visit (INDEPENDENT_AMBULATORY_CARE_PROVIDER_SITE_OTHER): Payer: Medicare Other | Admitting: Cardiology

## 2017-06-19 ENCOUNTER — Encounter: Payer: Self-pay | Admitting: Cardiology

## 2017-06-19 VITALS — BP 130/78 | HR 98 | Ht <= 58 in | Wt 128.4 lb

## 2017-06-19 DIAGNOSIS — I2510 Atherosclerotic heart disease of native coronary artery without angina pectoris: Secondary | ICD-10-CM | POA: Diagnosis not present

## 2017-06-19 MED ORDER — NITROGLYCERIN 0.4 MG SL SUBL: SUBLINGUAL_TABLET | SUBLINGUAL | 3 refills | Status: DC

## 2017-06-19 MED ORDER — ATORVASTATIN CALCIUM 40 MG PO TABS: ORAL_TABLET | ORAL | 3 refills | Status: DC

## 2017-06-19 MED ORDER — LOSARTAN POTASSIUM 50 MG PO TABS: 50.0000 mg | ORAL_TABLET | Freq: Every day | ORAL | 3 refills | Status: DC

## 2017-06-19 NOTE — Patient Instructions (Addendum)
Medication Instructions:  Your physician recommends that you continue on your current medications as directed. Please refer to the Current Medication list given to you today.   Labwork: None ordered  Testing/Procedures: None ordered  Follow-Up: Your physician wants you to follow-up in: 6 months with Dr. Harrington Challenger. You will receive a reminder letter in the mail two months in advance. If you don't receive a letter, please call our office to schedule the follow-up appointment.   Any Other Special Instructions Will Be Listed Below (If Applicable).     If you need a refill on your cardiac medications before your next appointment, please call your pharmacy.

## 2017-06-20 NOTE — Progress Notes (Signed)
06/20/2017 [name] [name]   1941-07-10  [id number]  Primary Physician Sinda Du, MD Primary Cardiologist: Dr. Harrington Challenger   Reason for Visit/CC: f/u for medication management, CAD, HTN and HLD  HPI:  The patient is a 76 y/o female, followed by Dr. Harrington Challenger, with h/o nonobstructive CAD by cath, treated medically, HTN and HLD, presenting to clinic for f/u. She is also in need of refills of her Lipitor and Losartan.   She denies any recent cardiac symptoms. No CP or dyspnea. She denies any exertional symptoms. She lives in a 2 story home and is able to ambulate her flight of stairs w/o exertional CP or dyspnea. She also denies LEE, orthopnea, PND, LEE, palpitations, syncope/ near syncope. She reports full med compliance. EKG shows NSR. BP is well controlled at 130/78. She is not fasting today. She reports that she has a f/u visit with her PCP next month and is scheduled to get fasting labs.     No outpatient prescriptions have been marked as taking for the 06/19/17 encounter (Office Visit) with Consuelo Pandy, PA-C.   Allergies  Allergen Reactions  . Hydrocodone-Acetaminophen Hives    REACTION: urticaria (hives)  . Vicodin [Hydrocodone-Acetaminophen] Itching   Past Medical History:  Diagnosis Date  . Arthritis   . Bipolar affective disorder ([place])   . Chronic back pain   . Chronic fatigue   . Dizziness   . Dyslipidemia   . Fibromyalgia   . GERD (gastroesophageal reflux disease)   . H/O: hysterectomy   . Hx of adenomatous colonic polyps 2005  . Hyperlipidemia   . Hypertension   . Knee pain   . Non-obstructive CAD    a. 08/2015 Cath: LM 40, LCX small, 30ost, 76m, OM2 small, RCA nl/small, EF 55-65%.  . Osteopenia   . Sciatica    Family History  Problem Relation Age of Onset  . Heart disease Mother   . Heart attack Mother   . Heart disease Father   . Heart attack Father   . Stroke Sister   . Stroke Maternal Grandfather   . Cancer Paternal Grandmother        unknown  primary  . Colon cancer Neg Hx    Past Surgical History:  Procedure Laterality Date  . ABDOMINAL HYSTERECTOMY  1994   TAH- DUB, BSO  . [place] bunionectomy     bilateral  . BREAST BIOPSY Left 1989  . BREAST LUMPECTOMY    . BUNIONECTOMY Left 2005  . CARDIAC CATHETERIZATION  2010   +CAD  . CARDIAC CATHETERIZATION N/A 08/19/2015   Procedure: Left Heart Cath and Coronary Angiography;  Surgeon: Belva Crome, MD;  Location: [facility];  Service: Cardiovascular;  Laterality: N/A;  . 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  Remainder of colonic mucosa was normal  . COLONOSCOPY WITH ESOPHAGOGASTRODUODENOSCOPY (EGD) N/A 02/01/2014   GGY:IRSW erosive reflux/gastric polyp/normal rectum/scattered left sided diverticula, normal distal TI. gastric bx negative, fundic gland polyp. next TCS 01/2019.  Marland Kitchen CYSTECTOMY     pilonidial cyst  . ESOPHAGOGASTRODUODENOSCOPY     followed by colonoscopy with snare polypectomy  . right knee replacement  02/2013  . SHOULDER ARTHROSCOPY Left 2005  . SHOULDER ARTHROSCOPY Right   . SPINAL FUSION    . SPINAL FUSION  2004   L4-L5  . TOTAL HIP ARTHROPLASTY Right 06/2006  . TOTAL HIP ARTHROPLASTY  8/12  . TOTAL SHOULDER REPLACEMENT Left 08/2005  Social History   Social History  . Marital status: Widowed    Spouse name: N/A  . Number of children: N/A  . Years of education: N/A   Occupational History  . unemployed Retired    retired Education officer, [employer]   Social History Main Topics  . Smoking status: Never Smoker  . Smokeless tobacco: Never Used  . Alcohol use 3.6 oz/week    6 Standard drinks or equivalent per week  . Drug use: No  . Sexual activity: No     Comment: TAH   Other Topics Concern  . Not on file   Social History Narrative  . No narrative on file     Review of Systems: General: negative for chills, fever, night sweats or weight changes.    Cardiovascular: negative for chest pain, dyspnea on exertion, edema, orthopnea, palpitations, paroxysmal nocturnal dyspnea or shortness of breath Dermatological: negative for rash Respiratory: negative for cough or wheezing Urologic: negative for hematuria Abdominal: negative for nausea, vomiting, diarrhea, bright red blood per rectum, melena, or hematemesis Neurologic: negative for visual changes, syncope, or dizziness All other systems reviewed and are otherwise negative except as noted above.   Physical Exam:  Blood pressure 130/78, pulse 98, height 4' 9.5" (1.461 m), weight 128 lb 6.4 oz (58.2 kg), last menstrual period 02/10/1993.  General appearance: alert, cooperative and no distress Neck: no carotid bruit and no JVD Lungs: clear to auscultation bilaterally Heart: regular rate and rhythm, S1, S2 normal, no murmur, click, rub or gallop Extremities: extremities normal, atraumatic, no cyanosis or edema Pulses: 2+ and symmetric Skin: Skin color, texture, turgor normal. No rashes or lesions Neurologic: Grossly normal  EKG NSR -- personally reviewed   ASSESSMENT AND PLAN:    1. CAD: h/o nonobstructive CAD by cath, treated medically. stable w/o angina symptoms. no exertional CP or dyspnea. EKG NSR w/o ischemia. continue medical therapy with ASA + statin.  2. HTN: controlled on current regimen. continue Losartan. PCP to obtain basic labs next month.  3. HLD: on statin therapy with Lipitor. she reports full compliance. Goal LDL is < 70 mg/dL. she is not fasting today. she plans to f/u with PCP next month for fasting labs. Pt advised to have PCP fax lab results for our review.  F/u with Dr. Harrington Challenger in 6 months.     Quiana Cobaugh Ladoris Gene, MHS [facility] 06/20/2017 4:53 PM

## 2017-08-08 ENCOUNTER — Encounter: Payer: Self-pay | Admitting: Certified Nurse Midwife

## 2017-08-08 ENCOUNTER — Ambulatory Visit (INDEPENDENT_AMBULATORY_CARE_PROVIDER_SITE_OTHER): Payer: Medicare Other | Admitting: Certified Nurse Midwife

## 2017-08-08 VITALS — BP 112/70 | HR 70 | Resp 16 | Ht <= 58 in | Wt 129.0 lb

## 2017-08-08 DIAGNOSIS — N898 Other specified noninflammatory disorders of vagina: Secondary | ICD-10-CM

## 2017-08-08 DIAGNOSIS — Z780 Asymptomatic menopausal state: Secondary | ICD-10-CM | POA: Diagnosis not present

## 2017-08-08 DIAGNOSIS — Z01419 Encounter for gynecological examination (general) (routine) without abnormal findings: Secondary | ICD-10-CM

## 2017-08-08 NOTE — Progress Notes (Deleted)
76 y.o. G61P2002 Widowed  Caucasian Fe here for annual exam. Postmenopausal, denies vaginal dryness or vaginal bleeding. Having issues with mobility with arthritis and will starting with PT soon for balance. Has knee weakness at times. Still having incontinence of stool only with pushing with urination.  Patient's last menstrual period was 02/10/1993.          Sexually active: No.  The current method of family planning is status post hysterectomy.    Exercising: No.  exercise Smoker:  no  Health Maintenance: Pap:  07-06-02 neg History of Abnormal Pap: no MMG:  Bilateral & left breast u/s 10/17 neg Self Breast exams: occ Colonoscopy:  2015 f/u 35yrs BMD:   2016 rheumatologist follows TDaP:  2014 Shingles: had done Pneumonia: 2018 Hep C and HIV: hep c was done Labs: ***   reports that she has never smoked. She has never used smokeless tobacco. She reports that she drinks alcohol. She reports that she does not use drugs.  Past Medical History:  Diagnosis Date  . Arthritis   . Bipolar affective disorder ([place])   . Cataract   . Chronic back pain   . Chronic fatigue   . Dizziness   . Dyslipidemia   . Fibromyalgia   . GERD (gastroesophageal reflux disease)   . H/O: hysterectomy   . Hx of adenomatous colonic polyps 2005  . Hyperlipidemia   . Hypertension   . Knee pain   . Non-obstructive CAD    a. 08/2015 Cath: LM 40, LCX small, 30ost, 57m, OM2 small, RCA nl/small, EF 55-65%.  . Osteopenia   . Sciatica     Past Surgical History:  Procedure Laterality Date  . ABDOMINAL HYSTERECTOMY  1994   TAH- DUB, BSO  . [place] bunionectomy     bilateral  . BREAST BIOPSY Left 1989  . BREAST LUMPECTOMY    . BUNIONECTOMY Left 2005  . CARDIAC CATHETERIZATION  2010   +CAD  . CARDIAC CATHETERIZATION N/A 08/19/2015   Procedure: Left Heart Cath and Coronary Angiography;  Surgeon: Belva Crome, MD;  Location: [facility];  Service: Cardiovascular;  Laterality: N/A;  . 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  Remainder of colonic mucosa was normal  . COLONOSCOPY WITH ESOPHAGOGASTRODUODENOSCOPY (EGD) N/A 02/01/2014   FBP:ZWCH erosive reflux/gastric polyp/normal rectum/scattered left sided diverticula, normal distal TI. gastric bx negative, fundic gland polyp. next TCS 01/2019.  Marland Kitchen CYSTECTOMY     pilonidial cyst  . ESOPHAGOGASTRODUODENOSCOPY     followed by colonoscopy with snare polypectomy  . right knee replacement  02/2013  . SHOULDER ARTHROSCOPY Left 2005  . SHOULDER ARTHROSCOPY Right   . SPINAL FUSION    . SPINAL FUSION  2004   L4-L5  . TOTAL HIP ARTHROPLASTY Right 06/2006  . TOTAL HIP ARTHROPLASTY  8/12  . TOTAL SHOULDER REPLACEMENT Left 08/2005    Current Outpatient Prescriptions  Medication Sig Dispense Refill  . ALPRAZolam (XANAX) 0.5 MG tablet Take 1 tablet by mouth daily as needed. anxiety    . aspirin 81 MG tablet Take 81 mg by mouth daily.    Marland Kitchen atorvastatin (LIPITOR) 40 MG tablet TAKE (1) TABLET BY MOUTH AT BEDTIME. 90 tablet 3  . CALCIUM PO Take by mouth daily.    . Cholecalciferol (VITAMIN D PO) Take 2,000 Int'l Units by mouth daily.    . DULoxetine (CYMBALTA) 60 MG capsule Take 120 mg by mouth daily.     Marland Kitchen  estradiol (ESTRACE) 1 MG tablet Take 0.5 tablets (0.5 mg total) by mouth daily. Take 1/2 tablet po daily 30 tablet 6  . lamoTRIgine (LAMICTAL) 150 MG tablet Take 150 mg by mouth at bedtime.    Marland Kitchen levothyroxine (LEVOTHROID) 25 MCG tablet Take 1 tablet (25 mcg total) by mouth daily. 30 tablet 12  . losartan (COZAAR) 50 MG tablet Take 1 tablet (50 mg total) by mouth daily. 90 tablet 3  . meloxicam (MOBIC) 7.5 MG tablet Take 7.5 mg by mouth 2 (two) times daily.     . Multiple Vitamin (MULTIVITAMIN) tablet Take 1 tablet by mouth daily.      . niacin (SLO-NIACIN) 500 MG tablet Take 500 mg by mouth daily.     . nitroGLYCERIN (NITROSTAT) 0.4 MG SL tablet PLACE 1 TAB UNDER  TONGUE EVERY 5 MIN IF NEEDED FOR CHEST PAIN. MAY USE 3 TIMES.NO RELIEF CALL 911. 25 tablet 3  . Omega-3 Fatty Acids (FISH OIL) 1000 MG CAPS Take 1 capsule by mouth daily.     . Probiotic Product (PROBIOTIC DAILY PO) Take 1 tablet by mouth every morning. Florify    . RESTASIS 0.05 % ophthalmic emulsion Place 1 drop into both eyes 2 (two) times daily.     . risperiDONE (RISPERDAL) 1 MG tablet Take 0.5 mg by mouth at bedtime.     . WELLBUTRIN XL 300 MG 24 hr tablet Take 300 mg by mouth daily.     Marland Kitchen zolpidem (AMBIEN) 10 MG tablet Take 10 mg by mouth at bedtime as needed for sleep.      No current facility-administered medications for this visit.     Family History  Problem Relation Age of Onset  . Heart disease Mother   . Heart attack Mother   . Heart disease Father   . Heart attack Father   . Stroke Sister   . Stroke Maternal Grandfather   . Cancer Paternal Grandmother        unknown primary  . Colon cancer Neg Hx     ROS:  Pertinent items are noted in HPI.  Otherwise, a comprehensive ROS was negative.  Exam:   BP 112/70   Pulse 70   Resp 16   Ht 4' 8.25" (1.429 m)   Wt 129 lb (58.5 kg)   LMP 02/10/1993   BMI 28.66 kg/m  Height: 4' 8.25" (142.9 cm) Ht Readings from Last 3 Encounters:  08/08/17 4' 8.25" (1.429 m)  06/19/17 4' 9.5" (1.461 m)  09/18/16 4\' 8"  (1.422 m)    General appearance: alert, cooperative and appears stated age Head: Normocephalic, without obvious abnormality, atraumatic Neck: no adenopathy, supple, symmetrical, trachea midline and thyroid {EXAM; THYROID:18604} Lungs: clear to auscultation bilaterally Breasts: {Exam; breast:13139::"normal appearance, no masses or tenderness"} Heart: regular rate and rhythm Abdomen: soft, non-tender; no masses,  no organomegaly Extremities: extremities normal, atraumatic, no cyanosis or edema Skin: Skin color, texture, turgor normal. No rashes or lesions Lymph nodes: Cervical, supraclavicular, and axillary nodes  normal. No abnormal inguinal nodes palpated Neurologic: Grossly normal   Pelvic: External genitalia:  no lesions              Urethra:  normal appearing urethra with no masses, tenderness or lesions              Bartholin's and Skene's: normal                 Vagina: normal appearing vagina with normal color and discharge, no lesions  Cervix: {exam; cervix:14595}              Pap taken: {yes no:314532} Bimanual Exam:  Uterus:  {exam; uterus:12215}              Adnexa: {exam; adnexa:12223}               Rectovaginal: Confirms               Anus:  normal sphincter tone, no lesions  Chaperone present: ***  A:  Well Woman with normal exam  P:   Reviewed health and wellness pertinent to exam  Pap smear: {YES NO:22349}  {plan; gyn:5269::"mammogram","pap smear","return annually or prn"}  An After Visit Summary was printed and given to the patient.

## 2017-08-08 NOTE — Progress Notes (Signed)
76 y.o. G68P2002 Widowed  Caucasian Fe here for annual exam. Postmenopausal, denies vaginal dryness or vaginal bleeding. Having issues with mobility with arthritis and will starting with PT soon for balance. Has knee weakness at times. Still having incontinence of stool only with pushing with urination. Eating well and still driving with no issues. Has cataract surgery planned. Sees PCP for hypertension, hypothyroid, cholesterol management, labs and aex. No changes per patient. Good support with children. First great grandchildren(2) born recently! No other health issues today.  Patient's last menstrual period was 02/10/1993.          Sexually active: No.  The current method of family planning is status post hysterectomy.    Exercising: No.  exercise Smoker:  no  Health Maintenance: Pap:  07-06-02 neg History of Abnormal Pap: no MMG:  Bilateral & left breast u/s 10/17 neg Self Breast exams: occ Colonoscopy:  2015 f/u 11yrs BMD:   2016 rheumatologist follows TDaP:  2014 Shingles: had done Pneumonia: 2018 Hep C and HIV: hep c was done Labs: no   reports that she has never smoked. She has never used smokeless tobacco. She reports that she drinks alcohol. She reports that she does not use drugs.  Past Medical History:  Diagnosis Date  . Arthritis   . Bipolar affective disorder ([place])   . Cataract   . Chronic back pain   . Chronic fatigue   . Dizziness   . Dyslipidemia   . Fibromyalgia   . GERD (gastroesophageal reflux disease)   . H/O: hysterectomy   . Hx of adenomatous colonic polyps 2005  . Hyperlipidemia   . Hypertension   . Knee pain   . Non-obstructive CAD    a. 08/2015 Cath: LM 40, LCX small, 30ost, 41m, OM2 small, RCA nl/small, EF 55-65%.  . Osteopenia   . Sciatica     Past Surgical History:  Procedure Laterality Date  . ABDOMINAL HYSTERECTOMY  1994   TAH- DUB, BSO  . [place] bunionectomy     bilateral  . BREAST BIOPSY Left 1989  . BREAST LUMPECTOMY    .  BUNIONECTOMY Left 2005  . CARDIAC CATHETERIZATION  2010   +CAD  . CARDIAC CATHETERIZATION N/A 08/19/2015   Procedure: Left Heart Cath and Coronary Angiography;  Surgeon: Belva Crome, MD;  Location: [facility];  Service: Cardiovascular;  Laterality: N/A;  . 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  Remainder of colonic mucosa was normal  . COLONOSCOPY WITH ESOPHAGOGASTRODUODENOSCOPY (EGD) N/A 02/01/2014   YSA:YTKZ erosive reflux/gastric polyp/normal rectum/scattered left sided diverticula, normal distal TI. gastric bx negative, fundic gland polyp. next TCS 01/2019.  Marland Kitchen CYSTECTOMY     pilonidial cyst  . ESOPHAGOGASTRODUODENOSCOPY     followed by colonoscopy with snare polypectomy  . right knee replacement  02/2013  . SHOULDER ARTHROSCOPY Left 2005  . SHOULDER ARTHROSCOPY Right   . SPINAL FUSION    . SPINAL FUSION  2004   L4-L5  . TOTAL HIP ARTHROPLASTY Right 06/2006  . TOTAL HIP ARTHROPLASTY  8/12  . TOTAL SHOULDER REPLACEMENT Left 08/2005    Current Outpatient Prescriptions  Medication Sig Dispense Refill  . ALPRAZolam (XANAX) 0.5 MG tablet Take 1 tablet by mouth daily as needed. anxiety    . aspirin 81 MG tablet Take 81 mg by mouth daily.    Marland Kitchen atorvastatin (LIPITOR) 40 MG tablet TAKE (1) TABLET BY MOUTH AT BEDTIME. 90 tablet 3  .  CALCIUM PO Take by mouth daily.    . Cholecalciferol (VITAMIN D PO) Take 2,000 Int'l Units by mouth daily.    . DULoxetine (CYMBALTA) 60 MG capsule Take 120 mg by mouth daily.     Marland Kitchen estradiol (ESTRACE) 1 MG tablet Take 0.5 tablets (0.5 mg total) by mouth daily. Take 1/2 tablet po daily 30 tablet 6  . lamoTRIgine (LAMICTAL) 150 MG tablet Take 150 mg by mouth at bedtime.    Marland Kitchen levothyroxine (LEVOTHROID) 25 MCG tablet Take 1 tablet (25 mcg total) by mouth daily. 30 tablet 12  . losartan (COZAAR) 50 MG tablet Take 1 tablet (50 mg total) by mouth daily. 90 tablet 3  .  meloxicam (MOBIC) 7.5 MG tablet Take 7.5 mg by mouth 2 (two) times daily.     . Multiple Vitamin (MULTIVITAMIN) tablet Take 1 tablet by mouth daily.      . niacin (SLO-NIACIN) 500 MG tablet Take 500 mg by mouth daily.     . nitroGLYCERIN (NITROSTAT) 0.4 MG SL tablet PLACE 1 TAB UNDER TONGUE EVERY 5 MIN IF NEEDED FOR CHEST PAIN. MAY USE 3 TIMES.NO RELIEF CALL 911. 25 tablet 3  . Omega-3 Fatty Acids (FISH OIL) 1000 MG CAPS Take 1 capsule by mouth daily.     . Probiotic Product (PROBIOTIC DAILY PO) Take 1 tablet by mouth every morning. Florify    . RESTASIS 0.05 % ophthalmic emulsion Place 1 drop into both eyes 2 (two) times daily.     . risperiDONE (RISPERDAL) 1 MG tablet Take 0.5 mg by mouth at bedtime.     . WELLBUTRIN XL 300 MG 24 hr tablet Take 300 mg by mouth daily.     Marland Kitchen zolpidem (AMBIEN) 10 MG tablet Take 10 mg by mouth at bedtime as needed for sleep.      No current facility-administered medications for this visit.     Family History  Problem Relation Age of Onset  . Heart disease Mother   . Heart attack Mother   . Heart disease Father   . Heart attack Father   . Stroke Sister   . Stroke Maternal Grandfather   . Cancer Paternal Grandmother        unknown primary  . Colon cancer Neg Hx     ROS:  Pertinent items are noted in HPI.  Otherwise, a comprehensive ROS was negative.  Exam:   BP 112/70   Pulse 70   Resp 16   Ht 4' 8.25" (1.429 m)   Wt 129 lb (58.5 kg)   LMP 02/10/1993   BMI 28.66 kg/m   Height: 4' 8.25" (142.9 cm) Ht Readings from Last 3 Encounters:  08/08/17 4' 8.25" (1.429 m)  06/19/17 4' 9.5" (1.461 m)  09/18/16 4\' 8"  (1.422 m)    General appearance: alert, cooperative and appears stated age Head: Normocephalic, without obvious abnormality, atraumatic Neck: no adenopathy, supple, symmetrical, trachea midline and thyroid normal to inspection and palpation Lungs: clear to auscultation bilaterally Breasts: normal appearance, no masses or tenderness, No  nipple retraction or dimpling, No nipple discharge or bleeding, No axillary or supraclavicular adenopathy Heart: regular rate and rhythm Abdomen: soft, non-tender; no masses,  no organomegaly Extremities: extremities normal, atraumatic, no cyanosis or edema Skin: Skin color, texture, turgor normal. No rashes or lesions Lymph nodes: Cervical, supraclavicular, and axillary nodes normal. No abnormal inguinal nodes palpated Neurologic: Grossly normal   Pelvic: External genitalia:  no lesions  Urethra:  normal appearing urethra with no masses, tenderness or lesions              Bartholin's and Skene's: normal                 Vagina: normal appearing vagina with normal color and discharge, no lesions              Cervix: absent              Pap taken: No. Bimanual Exam:  Uterus:  uterus absent              Adnexa: no mass, fullness, tenderness and adnex surgically absent               Rectovaginal: Confirms               Anus:  normal sphincter tone, no lesions  Chaperone present: yes  A:  Well Woman with normal exam  Post menopausal no HRT s/p TAH with BSO bleeding  Vaginal dryness uses Coconut oil if needed.  Hypothyroidism, Hypertension,cholesterol and anxiety management with PCP  Balance issues has evaluation with PT scheduled  P:   Reviewed health and wellness pertinent to exam  Discussed vaginal dryness improving with coconut oil, advise if issues  Continue follow up with MD as indicated  Keep evaluation with PT and discussed ways to avoid falls  Pap smear: no  counseled on breast self exam, mammography screening, adequate intake of calcium and vitamin D, diet and exercise, Kegel's exercises  return annually or prn  An After Visit Summary was printed and given to the patient.

## 2017-08-08 NOTE — Patient Instructions (Signed)

## 2017-08-12 ENCOUNTER — Encounter (HOSPITAL_COMMUNITY): Payer: Self-pay | Admitting: Physical Therapy

## 2017-08-12 ENCOUNTER — Ambulatory Visit (HOSPITAL_COMMUNITY): Payer: Medicare Other | Attending: Pulmonary Disease | Admitting: Physical Therapy

## 2017-08-12 DIAGNOSIS — M6281 Muscle weakness (generalized): Secondary | ICD-10-CM | POA: Diagnosis present

## 2017-08-12 DIAGNOSIS — M25562 Pain in left knee: Secondary | ICD-10-CM | POA: Diagnosis present

## 2017-08-12 DIAGNOSIS — R2689 Other abnormalities of gait and mobility: Secondary | ICD-10-CM | POA: Insufficient documentation

## 2017-08-12 DIAGNOSIS — M25561 Pain in right knee: Secondary | ICD-10-CM | POA: Diagnosis not present

## 2017-08-12 DIAGNOSIS — R2681 Unsteadiness on feet: Secondary | ICD-10-CM

## 2017-08-12 DIAGNOSIS — G8929 Other chronic pain: Secondary | ICD-10-CM | POA: Diagnosis present

## 2017-08-12 NOTE — Patient Instructions (Signed)
   TANDEM STANCE WITH SUPPORT  Stand in front of a chair, table or counter top for support. Then place the heel of one foot so that it is touching the toes of the other foot. Maintain your balance in this position.   Try to hold for at least 10 seconds before you switch your feet. Make sure you are not using a "death grip" on the counter, use a light touch only.  Repeat 3 times each side, twice a day.     SINGLE LEG STANCE - SLS  Stand on one leg and maintain your balance. Do this activity at your counter top, lightly using both hands for balance.  Try to hold at 10 seconds, then switch your feet.  Repeat 3 times each leg, twice a day.    BRIDGING  While lying on your back, tighten your lower abdominals, squeeze your buttocks and then raise your buttocks off the floor/bed as creating a "Bridge" with your body. Hold and then lower yourself and repeat 10 times, twice a day.    HIP ABDUCTION - SIDELYING  While lying on your side, slowly raise up your top leg to the side. Keep your knee straight and maintain your toes pointed forward the entire time. Keep your leg in-line with your body.  The bottom leg can be bent to stabilize your body. Do not let your hips roll backward.  Repeat 5-10 times each side, twice a day.

## 2017-08-12 NOTE — Therapy (Signed)
[place], [place], [address] Phone: [phone]   Fax:  [phone]  Physical Therapy Evaluation  Patient Details  Name: [name] [name] MRN: [id number] Date of Birth: [birth year] Referring Provider: Sinda Du   Encounter Date: 08/12/2017      PT End of Session - 08/12/17 1202    Visit Number 1   Number of Visits 9   Date for PT Re-Evaluation 09/09/17   Authorization Type U[employer] Medicare    Authorization Time Period 08/12/17 to 09/12/17   Authorization - Visit Number 1   Authorization - Number of Visits 10   PT Start Time 0947   PT Stop Time 1025   PT Time Calculation (min) 38 min   Equipment Utilized During Treatment Gait belt   Activity Tolerance Patient tolerated treatment well;Patient limited by fatigue   Behavior During Therapy [facility] for tasks assessed/performed      Past Medical History:  Diagnosis Date  . Arthritis   . Bipolar affective disorder ([place])   . Cataract   . Chronic back pain   . Chronic fatigue   . Dizziness   . Dyslipidemia   . Fibromyalgia   . GERD (gastroesophageal reflux disease)   . H/O: hysterectomy   . Hx of adenomatous colonic polyps 2005  . Hyperlipidemia   . Hypertension   . Knee pain   . Non-obstructive CAD    a. 08/2015 Cath: LM 40, LCX small, 30ost, 35m, OM2 small, RCA nl/small, EF 55-65%.  . Osteopenia   . Sciatica     Past Surgical History:  Procedure Laterality Date  . ABDOMINAL HYSTERECTOMY  1994   TAH- DUB, BSO  . [place] bunionectomy     bilateral  . BREAST BIOPSY Left 1989  . BREAST LUMPECTOMY    . BUNIONECTOMY Left 2005  . CARDIAC CATHETERIZATION  2010   +CAD  . CARDIAC CATHETERIZATION N/A 08/19/2015   Procedure: Left Heart Cath and Coronary Angiography;  Surgeon: Belva Crome, MD;  Location: [facility];  Service: Cardiovascular;  Laterality: N/A;  . COLONOSCOPY  01/29/2007   FYT:WKMQKM rectum, left-sided diverticula, diffusely pigmented  colon consistent with melanosis coli.  Remainder of colonic mucosa was normal  . COLONOSCOPY WITH ESOPHAGOGASTRODUODENOSCOPY (EGD) N/A 02/01/2014   MNO:TRRN erosive reflux/gastric polyp/normal rectum/scattered left sided diverticula, normal distal TI. gastric bx negative, fundic gland polyp. next TCS 01/2019.  Marland Kitchen CYSTECTOMY     pilonidial cyst  . ESOPHAGOGASTRODUODENOSCOPY     followed by colonoscopy with snare polypectomy  . right knee replacement  02/2013  . SHOULDER ARTHROSCOPY Left 2005  . SHOULDER ARTHROSCOPY Right   . SPINAL FUSION    . SPINAL FUSION  2004   L4-L5  . TOTAL HIP ARTHROPLASTY Right 06/2006  . TOTAL HIP ARTHROPLASTY  8/12  . TOTAL SHOULDER REPLACEMENT Left 08/2005    There were no vitals filed for this visit.       Subjective Assessment - 08/12/17 0948    Subjective Patient arrives stating she is concerned about her balance; she has a history of spinal fusion and knee replacement. She is also concerned about genearl weakness; if she wakls very far at all she needs a cane or grocery cart[place] went well. She has pain in her joints especially her L knee, this may need replacement soon. No falls recently but she has trouble with stairs if there is no railing; she has  had some close calls with falling due to almost tripping over things.    Pertinent History HTN, B hip replacements, fibromyalgia, bipolar, hx total shoulder replacement, hx back pain/surgeries, sciatica, B bunionectomies    Patient Stated Goals get balance better, work on strength, get HEP/independent exercise program    Currently in Pain? Yes   Pain Score 4    Pain Location Knee   Pain Orientation Right   Pain Descriptors / Indicators Aching;Penetrating   Pain Type Chronic pain   Pain Radiating Towards none but does have sciatica independent of knee pain    Pain Onset More than a month ago   Pain Frequency Constant   Aggravating Factors  standing on it     Pain Relieving Factors getting off of it but it is constant    Effect of Pain on Daily Activities moderate            [facility] PT Assessment - 08/12/17 0001      Assessment   Medical Diagnosis OA    Referring Provider Sinda Du    Onset Date/Surgical Date --  chronic/ongoing    Next MD Visit Dr. Luan Pulling in january 2019   Prior Therapy PT at this clinic      Precautions   Precautions Fall     Restrictions   Weight Bearing Restrictions No     Balance Screen   Has the patient fallen in the past 6 months No   Has the patient had a decrease in activity level because of a fear of falling?  Yes   Is the patient reluctant to leave their home because of a fear of falling?  No     Prior Function   Level of Independence Independent;Independent with basic ADLs;Independent with gait;Independent with transfers   Vocation Retired     Strength   Right Hip Flexion 4+/5   Right Hip Extension 3-/5   Right Hip ABduction 3-/5   Left Hip Flexion 4+/5   Left Hip Extension 3-/5   Left Hip ABduction 3/5   Right Knee Flexion 5/5   Right Knee Extension 4+/5   Left Knee Flexion 4+/5   Left Knee Extension 5/5   Right Ankle Dorsiflexion 5/5   Left Ankle Dorsiflexion 5/5     Ambulation/Gait   Gait Comments redcued heel toe pattern/landing foot flat, reduced knee ROM B, poor heel toe pattern, reduced ankle DF B, possible LLD, proximal weakness      6 minute walk test results    Aerobic Endurance Distance Walked 432   Endurance additional comments 3MWT no device      Berg Balance Test   Sit to Stand Able to stand without using hands and stabilize independently   Standing Unsupported Able to stand safely 2 minutes   Sitting with Back Unsupported but Feet Supported on Floor or Stool Able to sit safely and securely 2 minutes   Stand to Sit Sits safely with minimal use of hands   Transfers Able to transfer safely, minor use of hands   Standing Unsupported with Eyes Closed Able to stand 10  seconds safely   Standing Ubsupported with Feet Together Able to place feet together independently and stand for 1 minute with supervision   From Standing, Reach Forward with Outstretched Arm Can reach forward >12 cm safely (5")   From Standing Position, Pick up Object from Floor Able to pick up shoe safely and easily   From Standing Position, Turn to Look Behind Over each Shoulder Turn  sideways only but maintains balance   Turn 360 Degrees Able to turn 360 degrees safely one side only in 4 seconds or less   Standing Unsupported, Alternately Place Feet on Step/Stool Needs assistance to keep from falling or unable to try   Standing Unsupported, One Foot in [employer] balance while stepping or standing   Standing on One Leg Able to lift leg independently and hold equal to or more than 3 seconds   Total Score 41   Berg comment: 73%             Objective measurements completed on examination: See above findings.                  PT Education - 08/12/17 1202    Education provided Yes   Education Details exam findings, prognosis, POC, HEP    Person(s) Educated Patient   Methods Explanation;Demonstration;Handout   Comprehension Verbalized understanding;Returned demonstration;Need further instruction          PT Short Term Goals - 08/12/17 1207      PT SHORT TERM GOAL #1   Title Patient to be able to consistently ambulate with heel-toe pattern and improved general foot clearance and knee extension during gait in order to improve gait efficiency and reduce incidence of tripping    Time 2   Period Weeks   Status New   Target Date 08/26/17     PT SHORT TERM GOAL #2   Title Patient to be able to perform 5-10 minutes of moderate level activity without rest break and fatigue no more than 3/10 in order to show improving functional activity tolerance    Time 2   Period Weeks   Status New     PT SHORT TERM GOAL #3   Title Patient to be compliant with correct performance  of HEP, to be updated PRN    Time 1   Period Weeks   Status New   Target Date 08/19/17           PT Long Term Goals - 08/12/17 1210      PT LONG TERM GOAL #1   Title Patient to show improvement of at least 1 MMT grade in all tested muscle groups in order to improve balance and gait pattern    Time 4   Period Weeks   Status New   Target Date 09/09/17     PT LONG TERM GOAL #2   Title Patient to be able to tolerate at least 15-20 minutes of moderate physical activity with no rest breaks and fatigue no more than 3/10 in order to show improved functional activity tolerance and readiness for exercise program   Time 4   Period Weeks   Status New     PT LONG TERM GOAL #3   Title Patient to score at least 49 on Berg balance test in order to show reduced fall risk    Time 4   Period Weeks   Status New     PT LONG TERM GOAL #4   Title Patient to be educated regarding appropriate machines and exercises to perform at the local gym, as well as safe exercise progression principles in order to encourage independence with regular exercise program    Time 4   Period Weeks   Status New                Plan - 08/12/17 1204    Clinical Impression Statement Patient arrives with general complaints of knee  pain, poor balance, and reduced strength; she has been to PT at this clinic before but has not been compliant with HEP or activity recommendations, stating "I've been lazy". Examination reveals poor proximal muscle strength, impaired functional balance skills, gait deviation, reduced functional activity tolerance, and moderate fall risk. Recommend skilled PT services to address functional deficits, reduce fall risk, and assist in design of independent activity program moving forward.    History and Personal Factors relevant to plan of care: extensive surgical history, fibromyalgia, chronicity of pain, sedentary lifestyle    Clinical Presentation Stable   Clinical Presentation due to:  sedentary lifestyle, extensive surgical history   Clinical Decision Making Low   Rehab Potential Fair   Clinical Impairments Affecting Rehab Potential (+) motivated to participate in PT and start independent exercise program; (-) fair outcomes with PT in past, extensive surgical history, chronicity of pain, sedentary lifestyle    PT Frequency 2x / week   PT Duration 4 weeks   PT Treatment/Interventions ADLs/Self Care Home Management;Biofeedback;DME Instruction;Gait training;Stair training;Functional mobility training;Therapeutic activities;Therapeutic exercise;Balance training;Neuromuscular re-education;Patient/family education;Orthotic Fit/Training;Manual techniques;Energy conservation   PT Next Visit Plan review initial eval/goals, HEP; focus on balance, gait pattern, functional hip strength. Further assess leg lengths for possible discrepancy.    PT Home Exercise Plan Eval: tandem stance with light touch, SLS with light touch, bridges, sidelying hip ABD    Recommended Other Services possible shoe lift pending leg length measures    Consulted and Agree with Plan of Care Patient      Patient will benefit from skilled therapeutic intervention in order to improve the following deficits and impairments:  Abnormal gait, Pain, Decreased coordination, Decreased mobility, Postural dysfunction, Decreased activity tolerance, Decreased strength, Decreased balance, Difficulty walking, Improper body mechanics  Visit Diagnosis: Chronic pain of right knee - Plan: PT plan of care cert/re-cert  Chronic pain of left knee - Plan: PT plan of care cert/re-cert  Unsteadiness on feet - Plan: PT plan of care cert/re-cert  Other abnormalities of gait and mobility - Plan: PT plan of care cert/re-cert  Muscle weakness (generalized) - Plan: PT plan of care cert/re-cert      G-Codes - 38/10/17 09-Mar-1211    Functional Assessment Tool Used (Outpatient Only) Based on skilled clinical assessment of strength, gait,  balance, fall risk    Functional Limitation Mobility: Walking and moving around   Mobility: Walking and Moving Around Current Status (P1025) At least 60 percent but less than 80 percent impaired, limited or restricted   Mobility: Walking and Moving Around Goal Status [phone]) At least 40 percent but less than 60 percent impaired, limited or restricted       Problem List Patient Active Problem List   Diagnosis Date Noted  . Midsternal chest pain 08/19/2015  . Bipolar disorder ([place])   . Hypothyroidism 08/04/2015    Class: Diagnosis of  . Chronic cough 11/23/2014  . Fecal incontinence 11/23/2014  . Constipation 09/22/2013  . Hx of adenomatous colonic polyps 09/22/2013  . S/P total knee arthroplasty 04/27/2013  . Knee pain 04/27/2013  . Muscle weakness (generalized) 04/27/2013  . Chronic fatigue 03/05/2013  . Chronic pain syndrome 03/05/2013  . Osteoarthritis of both knees 03/05/2013  . DJD (degenerative joint disease) of pelvis 03/05/2013  . Vitamin D deficiency 03/05/2013  . Surgery, elective 03/05/2013  . Insomnia 03/05/2013  . Hot flashes 03/05/2013  . History of gastroesophageal reflux (GERD) 03/05/2013  . History of back surgery 03/05/2013  . H/O chest pain 03/05/2013  .  Aftercare following joint replacement 08/28/2012  . History of total shoulder replacement 08/28/2012  . Dislocation of hip joint prosthesis ([place]) 02/04/2012  . HTN (hypertension) 10/15/2011  . Degenerative joint disease of pelvic region 09/10/2011  . H/O bilateral hip replacements 07/26/2011  . Right hip pain 07/26/2011  . Difficulty in walking(719.7) 07/26/2011  . Hyperlipidemia 05/11/2009  . BIPOLAR AFFECTIVE DISORDER 05/11/2009  . Coronary artery disease 05/11/2009  . GERD 05/11/2009  . Fibromyalgia 05/11/2009  . DIZZINESS 05/11/2009  . Chest pain 05/11/2009    Deniece Ree PT, DPT [facility] [address] [place], [place],  [address] Phone: [phone]   Fax:  [phone]  Name: [name] [name] MRN: [id number] Date of Birth: Jul 15, 1941

## 2017-08-20 ENCOUNTER — Ambulatory Visit (HOSPITAL_COMMUNITY): Payer: Medicare Other

## 2017-08-20 ENCOUNTER — Encounter (HOSPITAL_COMMUNITY): Payer: Self-pay

## 2017-08-20 DIAGNOSIS — M25561 Pain in right knee: Secondary | ICD-10-CM | POA: Diagnosis not present

## 2017-08-20 DIAGNOSIS — G8929 Other chronic pain: Secondary | ICD-10-CM

## 2017-08-20 DIAGNOSIS — M6281 Muscle weakness (generalized): Secondary | ICD-10-CM

## 2017-08-20 DIAGNOSIS — M25562 Pain in left knee: Secondary | ICD-10-CM

## 2017-08-20 DIAGNOSIS — R2681 Unsteadiness on feet: Secondary | ICD-10-CM

## 2017-08-20 DIAGNOSIS — R2689 Other abnormalities of gait and mobility: Secondary | ICD-10-CM

## 2017-08-20 NOTE — Therapy (Signed)
[place], [place], [address] Phone: [phone]   Fax:  [phone]  Physical Therapy Treatment  Patient Details  Name: [name] [name] MRN: [id number] Date of Birth: 11/15/1940 Referring Provider: Sinda Du  Encounter Date: 08/20/2017      PT End of Session - 08/20/17 0908    Visit Number 2   Number of Visits 9   Date for PT Re-Evaluation 09/09/17   Authorization Type UHC Medicare    Authorization Time Period 08/12/17 to 09/12/17   Authorization - Visit Number 2   Authorization - Number of Visits 10   PT Start Time 0905   PT Stop Time 0947   PT Time Calculation (min) 42 min   Equipment Utilized During Treatment Gait belt   Activity Tolerance Patient tolerated treatment well;Patient limited by fatigue;No increased pain   Behavior During Therapy WFL for tasks assessed/performed      Past Medical History:  Diagnosis Date  . Arthritis   . Bipolar affective disorder ([place])   . Cataract   . Chronic back pain   . Chronic fatigue   . Dizziness   . Dyslipidemia   . Fibromyalgia   . GERD (gastroesophageal reflux disease)   . H/O: hysterectomy   . Hx of adenomatous colonic polyps 2005  . Hyperlipidemia   . Hypertension   . Knee pain   . Non-obstructive CAD    a. 08/2015 Cath: LM 40, LCX small, 30ost, 44m, OM2 small, RCA nl/small, EF 55-65%.  . Osteopenia   . Sciatica     Past Surgical History:  Procedure Laterality Date  . ABDOMINAL HYSTERECTOMY  1994   TAH- DUB, BSO  . [place] bunionectomy     bilateral  . BREAST BIOPSY Left 1989  . BREAST LUMPECTOMY    . BUNIONECTOMY Left 2005  . CARDIAC CATHETERIZATION  2010   +CAD  . CARDIAC CATHETERIZATION N/A 08/19/2015   Procedure: Left Heart Cath and Coronary Angiography;  Surgeon: Belva Crome, MD;  Location: [facility];  Service: Cardiovascular;  Laterality: N/A;  . COLONOSCOPY  01/29/2007   LYY:TKPTWS rectum, left-sided diverticula,  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  Remainder of colonic mucosa was normal  . COLONOSCOPY WITH ESOPHAGOGASTRODUODENOSCOPY (EGD) N/A 02/01/2014   FKC:LEXN erosive reflux/gastric polyp/normal rectum/scattered left sided diverticula, normal distal TI. gastric bx negative, fundic gland polyp. next TCS 01/2019.  Marland Kitchen CYSTECTOMY     pilonidial cyst  . ESOPHAGOGASTRODUODENOSCOPY     followed by colonoscopy with snare polypectomy  . right knee replacement  02/2013  . SHOULDER ARTHROSCOPY Left 2005  . SHOULDER ARTHROSCOPY Right   . SPINAL FUSION    . SPINAL FUSION  2004   L4-L5  . TOTAL HIP ARTHROPLASTY Right 06/2006  . TOTAL HIP ARTHROPLASTY  8/12  . TOTAL SHOULDER REPLACEMENT Left 08/2005    There were no vitals filed for this visit.      Subjective Assessment - 08/20/17 0907    Subjective Pt reports normal pain wiht arthritis, stays around 4-5/10 Rt knee.  No reports of recent falls and compliant with HEP.   Pertinent History HTN, B hip replacements, fibromyalgia, bipolar, hx total shoulder replacement, hx back pain/surgeries, sciatica, B bunionectomies    Patient Stated Goals get balance better, work on strength, get HEP/independent exercise program    Currently in Pain? Yes   Pain Score 4    Pain Location Knee   Pain Orientation Right   Pain  Descriptors / Indicators Sore   Pain Type Chronic pain   Pain Onset More than a month ago   Pain Frequency Constant   Aggravating Factors  standing on it   Pain Relieving Factors getting off of it but it is constant, meds, ice and rest   Effect of Pain on Daily Activities moderate            [facility] PT Assessment - 08/20/17 0001      Assessment   Medical Diagnosis OA    Referring Provider Sinda Du   Onset Date/Surgical Date --  chronic/ongoing   Next MD Visit Dr. Luan Pulling in january 2019   Prior Therapy PT at this clinic      Precautions   Precautions Fall     Observation/Other Assessments   Observations  Noted Lt SI anterior rotated, LLD                     [facility] Adult PT Treatment/Exercise - 08/20/17 0001      Exercises   Exercises Knee/Hip     Knee/Hip Exercises: Standing   Heel Raises 15 reps   Heel Raises Limitations Toe raises on incline slope   Hip Abduction 10 reps   Abduction Limitations cueing to reduce trunk sidebending   SLS 3x   Gait Training 226 ft heel to toe pattern, cueing to reduce NBOS   Other Standing Knee Exercises tandem stance 3x 30"   Other Standing Knee Exercises sidestep 1RT     Knee/Hip Exercises: Seated   Sit to Sand 10 reps;without UE support  eccentric control     Knee/Hip Exercises: Supine   Bridges 2 sets;15 reps   Bridges Limitations cueing for form and control     Knee/Hip Exercises: Sidelying   Hip ABduction 10 reps   Hip ABduction Limitations cueing for form                PT Education - 08/20/17 1756    Education provided Yes   Education Details Reviewed goals, assured compliance with HEP and copy of eval given to pt.     Person(s) Educated Patient   Methods Explanation;Demonstration;Handout   Comprehension Verbalized understanding;Returned demonstration;Need further instruction          PT Short Term Goals - 08/12/17 1207      PT SHORT TERM GOAL #1   Title Patient to be able to consistently ambulate with heel-toe pattern and improved general foot clearance and knee extension during gait in order to improve gait efficiency and reduce incidence of tripping    Time 2   Period Weeks   Status New   Target Date 08/26/17     PT SHORT TERM GOAL #2   Title Patient to be able to perform 5-10 minutes of moderate level activity without rest break and fatigue no more than 3/10 in order to show improving functional activity tolerance    Time 2   Period Weeks   Status New     PT SHORT TERM GOAL #3   Title Patient to be compliant with correct performance of HEP, to be updated PRN    Time 1   Period Weeks   Status  New   Target Date 08/19/17           PT Long Term Goals - 08/12/17 1210      PT LONG TERM GOAL #1   Title Patient to show improvement of at least 1 MMT grade in all tested muscle  groups in order to improve balance and gait pattern    Time 4   Period Weeks   Status New   Target Date 09/09/17     PT LONG TERM GOAL #2   Title Patient to be able to tolerate at least 15-20 minutes of moderate physical activity with no rest breaks and fatigue no more than 3/10 in order to show improved functional activity tolerance and readiness for exercise program   Time 4   Period Weeks   Status New     PT LONG TERM GOAL #3   Title Patient to score at least 49 on Berg balance test in order to show reduced fall risk    Time 4   Period Weeks   Status New     PT LONG TERM GOAL #4   Title Patient to be educated regarding appropriate machines and exercises to perform at the local gym, as well as safe exercise progression principles in order to encourage independence with regular exercise program    Time 4   Period Weeks   Status New               Plan - 08/20/17 1756    Clinical Impression Statement Reviewed goals, assured compliance wiht HEP and copy of eval given to pt.  Reviewed form and educated pt on importance of proper form and compliance wiht HEP for maximal benefits.  Assessed leg length discrepancy with noted Lt anterior rotation SI and Rt LE shorter though appear at same length prior measurements.  Min A with balance activities for safety.  EOS pt limited by fatigue, no reports of pain.   Rehab Potential Fair   Clinical Impairments Affecting Rehab Potential (+) motivated to participate in PT and start independent exercise program; (-) fair outcomes with PT in past, extensive surgical history, chronicity of pain, sedentary lifestyle    PT Frequency 2x / week   PT Duration 4 weeks   PT Treatment/Interventions ADLs/Self Care Home Management;Biofeedback;DME Instruction;Gait  training;Stair training;Functional mobility training;Therapeutic activities;Therapeutic exercise;Balance training;Neuromuscular re-education;Patient/family education;Orthotic Fit/Training;Manual techniques;Energy conservation   PT Next Visit Plan Next session focus on balance, gait pattern, functional hip strength. Further assess leg lengths for possible discrepancy; may recommend heel support for leg discrpancy   PT Home Exercise Plan Eval: tandem stance with light touch, SLS with light touch, bridges, sidelying hip ABD       Patient will benefit from skilled therapeutic intervention in order to improve the following deficits and impairments:  Abnormal gait, Pain, Decreased coordination, Decreased mobility, Postural dysfunction, Decreased activity tolerance, Decreased strength, Decreased balance, Difficulty walking, Improper body mechanics  Visit Diagnosis: Chronic pain of right knee  Chronic pain of left knee  Unsteadiness on feet  Other abnormalities of gait and mobility  Muscle weakness (generalized)     Problem List Patient Active Problem List   Diagnosis Date Noted  . Midsternal chest pain 08/19/2015  . Bipolar disorder ([place])   . Hypothyroidism 08/04/2015    Class: Diagnosis of  . Chronic cough 11/23/2014  . Fecal incontinence 11/23/2014  . Constipation 09/22/2013  . Hx of adenomatous colonic polyps 09/22/2013  . S/P total knee arthroplasty 04/27/2013  . Knee pain 04/27/2013  . Muscle weakness (generalized) 04/27/2013  . Chronic fatigue 03/05/2013  . Chronic pain syndrome 03/05/2013  . Osteoarthritis of both knees 03/05/2013  . DJD (degenerative joint disease) of pelvis 03/05/2013  . Vitamin D deficiency 03/05/2013  . Surgery, elective 03/05/2013  . Insomnia 03/05/2013  . Hot  flashes 03/05/2013  . History of gastroesophageal reflux (GERD) 03/05/2013  . History of back surgery 03/05/2013  . H/O chest pain 03/05/2013  . Aftercare following joint replacement  08/28/2012  . History of total shoulder replacement 08/28/2012  . Dislocation of hip joint prosthesis ([place]) 02/04/2012  . HTN (hypertension) 10/15/2011  . Degenerative joint disease of pelvic region 09/10/2011  . H/O bilateral hip replacements 07/26/2011  . Right hip pain 07/26/2011  . Difficulty in walking(719.7) 07/26/2011  . Hyperlipidemia 05/11/2009  . BIPOLAR AFFECTIVE DISORDER 05/11/2009  . Coronary artery disease 05/11/2009  . GERD 05/11/2009  . Fibromyalgia 05/11/2009  . DIZZINESS 05/11/2009  . Chest pain 05/11/2009   Ihor Austin, [place]; [place]  Aldona Lento 08/20/2017, 6:04 PM  [facility] [address] [place], [place], [address] Phone: [phone]   Fax:  [phone]  Name: ROSALEA WITHROW MRN: [id number] Date of Birth: 1941-04-06

## 2017-08-22 ENCOUNTER — Ambulatory Visit (HOSPITAL_COMMUNITY): Payer: Medicare Other | Admitting: Physical Therapy

## 2017-08-27 ENCOUNTER — Ambulatory Visit (HOSPITAL_COMMUNITY): Payer: Medicare Other

## 2017-08-27 ENCOUNTER — Encounter (HOSPITAL_COMMUNITY): Payer: Self-pay

## 2017-08-27 DIAGNOSIS — G8929 Other chronic pain: Secondary | ICD-10-CM

## 2017-08-27 DIAGNOSIS — M25561 Pain in right knee: Secondary | ICD-10-CM | POA: Diagnosis not present

## 2017-08-27 DIAGNOSIS — M6281 Muscle weakness (generalized): Secondary | ICD-10-CM

## 2017-08-27 DIAGNOSIS — M25562 Pain in left knee: Secondary | ICD-10-CM

## 2017-08-27 DIAGNOSIS — R2681 Unsteadiness on feet: Secondary | ICD-10-CM

## 2017-08-27 DIAGNOSIS — R2689 Other abnormalities of gait and mobility: Secondary | ICD-10-CM

## 2017-08-27 NOTE — Therapy (Signed)
[facility] [place], [place], [address] Phone: [phone]   Fax:  [phone]  Physical Therapy Treatment  Patient Details  Name: [name] [name] MRN: [id number] Date of Birth: 1941/03/21 Referring Provider: Sinda Du  Encounter Date: 08/27/2017      PT End of Session - 08/27/17 1433    Visit Number 3   Number of Visits 9   Date for PT Re-Evaluation 09/09/17   Authorization Type [employer] Medicare    Authorization Time Period 08/12/17 to 09/12/17   Authorization - Visit Number 3   Authorization - Number of Visits 10   PT Start Time 9038   PT Stop Time 1512   PT Time Calculation (min) 40 min   Equipment Utilized During Treatment Gait belt   Activity Tolerance Patient tolerated treatment well;Patient limited by fatigue;No increased pain   Behavior During Therapy WFL for tasks assessed/performed      Past Medical History:  Diagnosis Date  . Arthritis   . Bipolar affective disorder ([place])   . Cataract   . Chronic back pain   . Chronic fatigue   . Dizziness   . Dyslipidemia   . Fibromyalgia   . GERD (gastroesophageal reflux disease)   . H/O: hysterectomy   . Hx of adenomatous colonic polyps 2005  . Hyperlipidemia   . Hypertension   . Knee pain   . Non-obstructive CAD    a. 08/2015 Cath: LM 40, LCX small, 30ost, 73m OM2 small, RCA nl/small, EF 55-65%.  . Osteopenia   . Sciatica     Past Surgical History:  Procedure Laterality Date  . ABDOMINAL HYSTERECTOMY  1994   TAH- DUB, BSO  . [place] bunionectomy     bilateral  . BREAST BIOPSY Left 1989  . BREAST LUMPECTOMY    . BUNIONECTOMY Left 2005  . CARDIAC CATHETERIZATION  2010   +CAD  . CARDIAC CATHETERIZATION N/A 08/19/2015   Procedure: Left Heart Cath and Coronary Angiography;  Surgeon: HBelva Crome MD;  Location: [facility];  Service: Cardiovascular;  Laterality: N/A;  . COLONOSCOPY  01/29/2007   RBFX:OVANVBrectum, left-sided diverticula,  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  Remainder of colonic mucosa was normal  . COLONOSCOPY WITH ESOPHAGOGASTRODUODENOSCOPY (EGD) N/A 02/01/2014   RTYO:MAYOerosive reflux/gastric polyp/normal rectum/scattered left sided diverticula, normal distal TI. gastric bx negative, fundic gland polyp. next TCS 01/2019.  .Marland KitchenCYSTECTOMY     pilonidial cyst  . ESOPHAGOGASTRODUODENOSCOPY     followed by colonoscopy with snare polypectomy  . right knee replacement  02/2013  . SHOULDER ARTHROSCOPY Left 2005  . SHOULDER ARTHROSCOPY Right   . SPINAL FUSION    . SPINAL FUSION  2004   L4-L5  . TOTAL HIP ARTHROPLASTY Right 06/2006  . TOTAL HIP ARTHROPLASTY  8/12  . TOTAL SHOULDER REPLACEMENT Left 08/2005    There were no vitals filed for this visit.      Subjective Assessment - 08/27/17 1433    Subjective Pt states that she is doing okay. Her knees are hurting more than usual today, R>L.    Pertinent History HTN, B hip replacements, fibromyalgia, bipolar, hx total shoulder replacement, hx back pain/surgeries, sciatica, B bunionectomies    Patient Stated Goals get balance better, work on strength, get HEP/independent exercise program    Currently in Pain? Yes   Pain Score 6    Pain Location Knee   Pain Orientation Right;Left   Pain Descriptors / Indicators Sore  Pain Type Chronic pain   Pain Onset More than a month ago   Pain Frequency Constant   Aggravating Factors  standing on it   Pain Relieving Factors getting off of it but it is constant, meds, ice and rest   Effect of Pain on Daily Activities moderate            [facility] PT Assessment - 08/27/17 0001      Observation/Other Assessments   Observations L leg length: 84 cm, R leg length: 83 cm; L SI innominate anteriorly rotated          [facility] Adult PT Treatment/Exercise - 08/27/17 0001      Knee/Hip Exercises: Stretches   Other Knee/Hip Stretches seated lumbar flexion stretch in sitting 5x10"     Knee/Hip Exercises:  Standing   Rocker Board 2 minutes  R/L   SLS 5x10" each   SLS with Vectors x5RT each with 1 HHA   Other Standing Knee Exercises hip hike x10 each     Knee/Hip Exercises: Supine   Heel Slides Limitations bent knee raise from 4" step and pulldowns with purple band 2x15 reps     Knee/Hip Exercises: Sidelying   Clams 2x10 with RTB     Manual Therapy   Manual Therapy Muscle Energy Technique   Manual therapy comments completed separate rest of treatment   Muscle Energy Technique SI MET for L anteriorly rotated innominate (bil leg lengths 84cm following)                PT Education - 08/27/17 1511    Education provided Yes   Education Details exercise technique, will continue to assess LLD, SI alignment   Person(s) Educated Patient   Methods Explanation;Demonstration   Comprehension Verbalized understanding;Returned demonstration          PT Short Term Goals - 08/12/17 1207      PT SHORT TERM GOAL #1   Title Patient to be able to consistently ambulate with heel-toe pattern and improved general foot clearance and knee extension during gait in order to improve gait efficiency and reduce incidence of tripping    Time 2   Period Weeks   Status New   Target Date 08/26/17     PT SHORT TERM GOAL #2   Title Patient to be able to perform 5-10 minutes of moderate level activity without rest break and fatigue no more than 3/10 in order to show improving functional activity tolerance    Time 2   Period Weeks   Status New     PT SHORT TERM GOAL #3   Title Patient to be compliant with correct performance of HEP, to be updated PRN    Time 1   Period Weeks   Status New   Target Date 08/19/17           PT Long Term Goals - 08/12/17 1210      PT LONG TERM GOAL #1   Title Patient to show improvement of at least 1 MMT grade in all tested muscle groups in order to improve balance and gait pattern    Time 4   Period Weeks   Status New   Target Date 09/09/17     PT LONG  TERM GOAL #2   Title Patient to be able to tolerate at least 15-20 minutes of moderate physical activity with no rest breaks and fatigue no more than 3/10 in order to show improved functional activity tolerance and readiness for exercise program  Time 4   Period Weeks   Status New     PT LONG TERM GOAL #3   Title Patient to score at least 49 on Berg balance test in order to show reduced fall risk    Time 4   Period Weeks   Status New     PT LONG TERM GOAL #4   Title Patient to be educated regarding appropriate machines and exercises to perform at the local gym, as well as safe exercise progression principles in order to encourage independence with regular exercise program    Time 4   Period Weeks   Status New               Plan - 08/27/17 1513    Clinical Impression Statement Began session by assessing LLD and SI alignment; RLE 83cm and LLE 84cm and pt noted to have L anteriorly rotated innominate. Performed SI MET to address this and pt had symmetrical leg lengths at 84cm each. Rest of session focused on gluteal and core strengthening as well as balance. Pt noted to have significant R glute med weakness as evidenced by Trendelenberg during ambulation and SLS activities. Pt's pain 5/10 at EOS.   Rehab Potential Fair   Clinical Impairments Affecting Rehab Potential (+) motivated to participate in PT and start independent exercise program; (-) fair outcomes with PT in past, extensive surgical history, chronicity of pain, sedentary lifestyle    PT Frequency 2x / week   PT Duration 4 weeks   PT Treatment/Interventions ADLs/Self Care Home Management;Biofeedback;DME Instruction;Gait training;Stair training;Functional mobility training;Therapeutic activities;Therapeutic exercise;Balance training;Neuromuscular re-education;Patient/family education;Orthotic Fit/Training;Manual techniques;Energy conservation   PT Next Visit Plan Next session focus on balance, gait pattern, functional hip  strength. Continue to assess LLD, SI aligment and perform SI MET PRN and then check LLD following, may recommend heel support for leg discrpancy   PT Home Exercise Plan Eval: tandem stance with light touch, SLS with light touch, bridges, sidelying hip ABD    Consulted and Agree with Plan of Care Patient      Patient will benefit from skilled therapeutic intervention in order to improve the following deficits and impairments:  Abnormal gait, Pain, Decreased coordination, Decreased mobility, Postural dysfunction, Decreased activity tolerance, Decreased strength, Decreased balance, Difficulty walking, Improper body mechanics  Visit Diagnosis: Chronic pain of right knee  Chronic pain of left knee  Unsteadiness on feet  Other abnormalities of gait and mobility  Muscle weakness (generalized)     Problem List Patient Active Problem List   Diagnosis Date Noted  . Midsternal chest pain 08/19/2015  . Bipolar disorder ([place])   . Hypothyroidism 08/04/2015    Class: Diagnosis of  . Chronic cough 11/23/2014  . Fecal incontinence 11/23/2014  . Constipation 09/22/2013  . Hx of adenomatous colonic polyps 09/22/2013  . S/P total knee arthroplasty 04/27/2013  . Knee pain 04/27/2013  . Muscle weakness (generalized) 04/27/2013  . Chronic fatigue 03/05/2013  . Chronic pain syndrome 03/05/2013  . Osteoarthritis of both knees 03/05/2013  . DJD (degenerative joint disease) of pelvis 03/05/2013  . Vitamin D deficiency 03/05/2013  . Surgery, elective 03/05/2013  . Insomnia 03/05/2013  . Hot flashes 03/05/2013  . History of gastroesophageal reflux (GERD) 03/05/2013  . History of back surgery 03/05/2013  . H/O chest pain 03/05/2013  . Aftercare following joint replacement 08/28/2012  . History of total shoulder replacement 08/28/2012  . Dislocation of hip joint prosthesis ([place]) 02/04/2012  . HTN (hypertension) 10/15/2011  . Degenerative joint disease  of pelvic region 09/10/2011  . H/O bilateral  hip replacements 07/26/2011  . Right hip pain 07/26/2011  . Difficulty in walking(719.7) 07/26/2011  . Hyperlipidemia 05/11/2009  . BIPOLAR AFFECTIVE DISORDER 05/11/2009  . Coronary artery disease 05/11/2009  . GERD 05/11/2009  . Fibromyalgia 05/11/2009  . DIZZINESS 05/11/2009  . Chest pain 05/11/2009       Geraldine Solar PT, DPT  [facility] [address] [place], [place], [address] Phone: [phone]   Fax:  [phone]  Name: FAITHANN NATAL MRN: [id number] Date of Birth: Mar 16, 1941

## 2017-08-29 ENCOUNTER — Ambulatory Visit (HOSPITAL_COMMUNITY): Payer: Medicare Other

## 2017-08-29 ENCOUNTER — Encounter (HOSPITAL_COMMUNITY): Payer: Self-pay

## 2017-08-29 DIAGNOSIS — R2681 Unsteadiness on feet: Secondary | ICD-10-CM

## 2017-08-29 DIAGNOSIS — M25561 Pain in right knee: Secondary | ICD-10-CM | POA: Diagnosis not present

## 2017-08-29 DIAGNOSIS — G8929 Other chronic pain: Secondary | ICD-10-CM

## 2017-08-29 DIAGNOSIS — M6281 Muscle weakness (generalized): Secondary | ICD-10-CM

## 2017-08-29 DIAGNOSIS — R2689 Other abnormalities of gait and mobility: Secondary | ICD-10-CM

## 2017-08-29 DIAGNOSIS — M25562 Pain in left knee: Secondary | ICD-10-CM

## 2017-08-29 NOTE — Therapy (Signed)
[facility] [place], [place], [address] Phone: [phone]   Fax:  [phone]  Physical Therapy Treatment  Patient Details  Name: [name] [name] MRN: [id number] Date of Birth: 1941/10/18 Referring Provider: Sinda Du  Encounter Date: 08/29/2017      PT End of Session - 08/29/17 0900    Visit Number 4   Number of Visits 9   Date for PT Re-Evaluation 09/09/17   Authorization Type UHC Medicare    Authorization Time Period 08/12/17 to 09/12/17   Authorization - Visit Number 4   Authorization - Number of Visits 10   PT Start Time 0817   PT Stop Time 0900   PT Time Calculation (min) 43 min   Equipment Utilized During Treatment Gait belt   Activity Tolerance Patient tolerated treatment well;Patient limited by fatigue;No increased pain   Behavior During Therapy WFL for tasks assessed/performed      Past Medical History:  Diagnosis Date  . Arthritis   . Bipolar affective disorder ([place])   . Cataract   . Chronic back pain   . Chronic fatigue   . Dizziness   . Dyslipidemia   . Fibromyalgia   . GERD (gastroesophageal reflux disease)   . H/O: hysterectomy   . Hx of adenomatous colonic polyps 2005  . Hyperlipidemia   . Hypertension   . Knee pain   . Non-obstructive CAD    a. 08/2015 Cath: LM 40, LCX small, 30ost, 28m OM2 small, RCA nl/small, EF 55-65%.  . Osteopenia   . Sciatica     Past Surgical History:  Procedure Laterality Date  . ABDOMINAL HYSTERECTOMY  1994   TAH- DUB, BSO  . [place] bunionectomy     bilateral  . BREAST BIOPSY Left 1989  . BREAST LUMPECTOMY    . BUNIONECTOMY Left 2005  . CARDIAC CATHETERIZATION  2010   +CAD  . CARDIAC CATHETERIZATION N/A 08/19/2015   Procedure: Left Heart Cath and Coronary Angiography;  Surgeon: HBelva Crome MD;  [facility];  Service: Cardiovascular;  Laterality: N/A;  . COLONOSCOPY  01/29/2007   RQBH:ALPFXTrectum, left-sided diverticula,  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  Remainder of colonic mucosa was normal  . COLONOSCOPY WITH ESOPHAGOGASTRODUODENOSCOPY (EGD) N/A 02/01/2014   RKWI:OXBDerosive reflux/gastric polyp/normal rectum/scattered left sided diverticula, normal distal TI. gastric bx negative, fundic gland polyp. next TCS 01/2019.  .Marland KitchenCYSTECTOMY     pilonidial cyst  . ESOPHAGOGASTRODUODENOSCOPY     followed by colonoscopy with snare polypectomy  . right knee replacement  02/2013  . SHOULDER ARTHROSCOPY Left 2005  . SHOULDER ARTHROSCOPY Right   . SPINAL FUSION    . SPINAL FUSION  2004   L4-L5  . TOTAL HIP ARTHROPLASTY Right 06/2006  . TOTAL HIP ARTHROPLASTY  8/12  . TOTAL SHOULDER REPLACEMENT Left 08/2005    There were no vitals filed for this visit.      Subjective Assessment - 08/29/17 0821    Subjective Pt stated she is hurting all over today.  Reports her shoulders are very tight and dull achey; Rt knee pain scale 4-5/10.  Reoprts she felt more balanced following the SI alignment last session.     Patient Stated Goals get balance better, work on strength, get HEP/independent exercise program    Currently in Pain? Yes   Pain Score 5    Pain Location Knee   Pain Orientation Right   Pain Descriptors / Indicators Aching;Dull;Sore  Pain Type Chronic pain   Pain Onset More than a month ago   Pain Frequency Constant   Aggravating Factors  standing on it   Pain Relieving Factors getting off of it but it is constant, meds, ice and rest   Effect of Pain on Daily Activities moderate            [facility] PT Assessment - 08/29/17 0001      Observation/Other Assessments   Observations Bil Leg length 84cm, no SI alignment needed this session                     [facility] Adult PT Treatment/Exercise - 08/29/17 0001      Knee/Hip Exercises: Standing   Rocker Board 2 minutes   SLS 5x10" each   SLS with Vectors x5RT each with 1 HHA   Other Standing Knee Exercises tandem stance 3x  30"   Other Standing Knee Exercises isometric abd 10x 3", sidestep 2RT     Knee/Hip Exercises: Seated   Sit to Sand 2 sets;15 reps;without UE support     Knee/Hip Exercises: Supine   Heel Slides Limitations bent knee raise from 4" step and pulldowns with purple band 2x15 reps                  PT Short Term Goals - 08/12/17 1207      PT SHORT TERM GOAL #1   Title Patient to be able to consistently ambulate with heel-toe pattern and improved general foot clearance and knee extension during gait in order to improve gait efficiency and reduce incidence of tripping    Time 2   Period Weeks   Status New   Target Date 08/26/17     PT SHORT TERM GOAL #2   Title Patient to be able to perform 5-10 minutes of moderate level activity without rest break and fatigue no more than 3/10 in order to show improving functional activity tolerance    Time 2   Period Weeks   Status New     PT SHORT TERM GOAL #3   Title Patient to be compliant with correct performance of HEP, to be updated PRN    Time 1   Period Weeks   Status New   Target Date 08/19/17           PT Long Term Goals - 08/12/17 1210      PT LONG TERM GOAL #1   Title Patient to show improvement of at least 1 MMT grade in all tested muscle groups in order to improve balance and gait pattern    Time 4   Period Weeks   Status New   Target Date 09/09/17     PT LONG TERM GOAL #2   Title Patient to be able to tolerate at least 15-20 minutes of moderate physical activity with no rest breaks and fatigue no more than 3/10 in order to show improved functional activity tolerance and readiness for exercise program   Time 4   Period Weeks   Status New     PT LONG TERM GOAL #3   Title Patient to score at least 49 on Berg balance test in order to show reduced fall risk    Time 4   Period Weeks   Status New     PT LONG TERM GOAL #4   Title Patient to be educated regarding appropriate machines and exercises to perform at  the local gym, as well as safe exercise  progression principles in order to encourage independence with regular exercise program    Time 4   Period Weeks   Status New               Plan - 08/29/17 1146    Clinical Impression Statement Assessed LLD at beginning of session with good SI alignment and equal leg length at 84 cm.  Session foucs on proximal strengthening and balance training.  Pt continues to demonstate significant glut med weakness noted by trendelenberg gait and difficulty with SLS activities.  Added isometric abd in CKC for strengthening.  No reports of increased pain through session.     Rehab Potential Fair   Clinical Impairments Affecting Rehab Potential (+) motivated to participate in PT and start independent exercise program; (-) fair outcomes with PT in past, extensive surgical history, chronicity of pain, sedentary lifestyle    PT Frequency 2x / week   PT Duration 4 weeks   PT Treatment/Interventions ADLs/Self Care Home Management;Biofeedback;DME Instruction;Gait training;Stair training;Functional mobility training;Therapeutic activities;Therapeutic exercise;Balance training;Neuromuscular re-education;Patient/family education;Orthotic Fit/Training;Manual techniques;Energy conservation   PT Next Visit Plan Next session focus on balance, gait pattern, functional hip strength. Continue to assess LLD, SI aligment and perform SI MET PRN and then check LLD following, may recommend heel support for leg discrpancy   PT Home Exercise Plan Eval: tandem stance with light touch, SLS with light touch, bridges, sidelying hip ABD       Patient will benefit from skilled therapeutic intervention in order to improve the following deficits and impairments:  Abnormal gait, Pain, Decreased coordination, Decreased mobility, Postural dysfunction, Decreased activity tolerance, Decreased strength, Decreased balance, Difficulty walking, Improper body mechanics  Visit Diagnosis: Chronic pain of  right knee  Chronic pain of left knee  Unsteadiness on feet  Other abnormalities of gait and mobility  Muscle weakness (generalized)     Problem List Patient Active Problem List   Diagnosis Date Noted  . Midsternal chest pain 08/19/2015  . Bipolar disorder ([place])   . Hypothyroidism 08/04/2015    Class: Diagnosis of  . Chronic cough 11/23/2014  . Fecal incontinence 11/23/2014  . Constipation 09/22/2013  . Hx of adenomatous colonic polyps 09/22/2013  . S/P total knee arthroplasty 04/27/2013  . Knee pain 04/27/2013  . Muscle weakness (generalized) 04/27/2013  . Chronic fatigue 03/05/2013  . Chronic pain syndrome 03/05/2013  . Osteoarthritis of both knees 03/05/2013  . DJD (degenerative joint disease) of pelvis 03/05/2013  . Vitamin D deficiency 03/05/2013  . Surgery, elective 03/05/2013  . Insomnia 03/05/2013  . Hot flashes 03/05/2013  . History of gastroesophageal reflux (GERD) 03/05/2013  . History of back surgery 03/05/2013  . H/O chest pain 03/05/2013  . Aftercare following joint replacement 08/28/2012  . History of total shoulder replacement 08/28/2012  . Dislocation of hip joint prosthesis ([place]) 02/04/2012  . HTN (hypertension) 10/15/2011  . Degenerative joint disease of pelvic region 09/10/2011  . H/O bilateral hip replacements 07/26/2011  . Right hip pain 07/26/2011  . Difficulty in walking(719.7) 07/26/2011  . Hyperlipidemia 05/11/2009  . BIPOLAR AFFECTIVE DISORDER 05/11/2009  . Coronary artery disease 05/11/2009  . GERD 05/11/2009  . Fibromyalgia 05/11/2009  . DIZZINESS 05/11/2009  . Chest pain 05/11/2009   Ihor Austin, [place]; Arnold  Aldona Lento 08/29/2017, 11:50 AM  [facility] [address] [place], [place], [address] Phone: [phone]   Fax:  [phone]  Name: MINNA DUMIRE MRN: [id number] Date of Birth: 1941/07/02

## 2017-09-03 ENCOUNTER — Encounter (HOSPITAL_COMMUNITY): Payer: Medicare Other | Admitting: Physical Therapy

## 2017-09-04 ENCOUNTER — Ambulatory Visit (HOSPITAL_COMMUNITY): Payer: Medicare Other | Admitting: Physical Therapy

## 2017-09-04 ENCOUNTER — Encounter (HOSPITAL_COMMUNITY): Payer: Self-pay | Admitting: Physical Therapy

## 2017-09-04 DIAGNOSIS — R2689 Other abnormalities of gait and mobility: Secondary | ICD-10-CM

## 2017-09-04 DIAGNOSIS — M25561 Pain in right knee: Secondary | ICD-10-CM | POA: Diagnosis not present

## 2017-09-04 DIAGNOSIS — M6281 Muscle weakness (generalized): Secondary | ICD-10-CM

## 2017-09-04 DIAGNOSIS — M25562 Pain in left knee: Secondary | ICD-10-CM

## 2017-09-04 DIAGNOSIS — G8929 Other chronic pain: Secondary | ICD-10-CM

## 2017-09-04 DIAGNOSIS — R2681 Unsteadiness on feet: Secondary | ICD-10-CM

## 2017-09-04 NOTE — Therapy (Signed)
[place], [place], [address] Phone: [phone]   Fax:  [phone]  Physical Therapy Treatment  Patient Details  Name: [name] [name] MRN: [id number] Date of Birth: 06-Aug-1941 Referring Provider: Sinda Du  Encounter Date: 09/04/2017      PT End of Session - 09/04/17 0944    Visit Number 5   Number of Visits 9   Date for PT Re-Evaluation 09/09/17   Authorization Type [employer]    Authorization Time Period 08/12/17 to 09/12/17   Authorization - Visit Number 5   Authorization - Number of Visits 10   PT Start Time 0905   PT Stop Time 0943   PT Time Calculation (min) 38 min   Activity Tolerance Patient tolerated treatment well;Patient limited by fatigue   Behavior During Therapy [facility] for tasks assessed/performed      Past Medical History:  Diagnosis Date  . Arthritis   . Bipolar affective disorder ([place])   . Cataract   . Chronic back pain   . Chronic fatigue   . Dizziness   . Dyslipidemia   . Fibromyalgia   . GERD (gastroesophageal reflux disease)   . H/O: hysterectomy   . Hx of adenomatous colonic polyps 2005  . Hyperlipidemia   . Hypertension   . Knee pain   . Non-obstructive CAD    a. 08/2015 Cath: LM 40, LCX small, 30ost, 79m OM2 small, RCA nl/small, EF 55-65%.  . Osteopenia   . Sciatica     Past Surgical History:  Procedure Laterality Date  . ABDOMINAL HYSTERECTOMY  1994   TAH- DUB, BSO  . [place] bunionectomy     bilateral  . BREAST BIOPSY Left 1989  . BREAST LUMPECTOMY    . BUNIONECTOMY Left 2005  . CARDIAC CATHETERIZATION  2010   +CAD  . CARDIAC CATHETERIZATION N/A 08/19/2015   Procedure: Left Heart Cath and Coronary Angiography;  Surgeon: HBelva Crome MD;  [facility];  Service: Cardiovascular;  Laterality: N/A;  . COLONOSCOPY  01/29/2007   RDQQ:IWLNLGrectum, left-sided diverticula, 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  Remainder of  colonic mucosa was normal  . COLONOSCOPY WITH ESOPHAGOGASTRODUODENOSCOPY (EGD) N/A 02/01/2014   RXQJ:JHERerosive reflux/gastric polyp/normal rectum/scattered left sided diverticula, normal distal TI. gastric bx negative, fundic gland polyp. next TCS 01/2019.  .Marland KitchenCYSTECTOMY     pilonidial cyst  . ESOPHAGOGASTRODUODENOSCOPY     followed by colonoscopy with snare polypectomy  . right knee replacement  02/2013  . SHOULDER ARTHROSCOPY Left 2005  . SHOULDER ARTHROSCOPY Right   . SPINAL FUSION    . SPINAL FUSION  2004   L4-L5  . TOTAL HIP ARTHROPLASTY Right 06/2006  . TOTAL HIP ARTHROPLASTY  8/12  . TOTAL SHOULDER REPLACEMENT Left 08/2005    There were no vitals filed for this visit.      Subjective Assessment - 09/04/17 0906    Subjective Patient arrives stating she feels she is getting better but is slow progress, her pain is about the same. She did have a close call with falling on some stairs due to left knee buckling, she was able to catch herself on the rail. She feels that the hip strengthening has been helping the most so far.    Pertinent History HTN, B hip replacements, fibromyalgia, bipolar, hx total shoulder replacement, hx back pain/surgeries, sciatica, B bunionectomies    Patient Stated Goals get balance better, work on strength, get HEP/independent exercise  program    Currently in Pain? Yes   Pain Score 5    Pain Location Shoulder   Pain Orientation Right;Left   Pain Descriptors / Indicators Aching   Pain Type Chronic pain   Pain Radiating Towards into neck    Pain Onset More than a month ago   Pain Frequency Constant   Aggravating Factors  cold weather    Pain Relieving Factors heat    Effect of Pain on Daily Activities none                          [facility] Adult PT Treatment/Exercise - 09/04/17 0001      Knee/Hip Exercises: Supine   Bridges Both;1 set;20 reps   Bridges Limitations 3 second holds    Straight Leg Raises Both;1 set;10 reps   Straight Leg  Raises Limitations core set    Other Supine Knee/Hip Exercises sidelying hip ABD with red TB 1x15 B; TA sets 1x15 3 second holds; dead bugs with core set 1x10 B    Other Supine Knee/Hip Exercises supine clams with red TB 3 second holds 1x10      Knee/Hip Exercises: Sidelying   Clams 2x10 with RTB     Knee/Hip Exercises: Prone   Hip Extension Both;1 set;10 reps  knee bent      Manual Therapy   Manual Therapy Muscle Energy Technique   Manual therapy comments completed separate rest of treatment   Muscle Energy Technique SI MET for L anteriorly rotated innominate (bil leg lengths 84cm following)             Balance Exercises - 09/04/17 0938      Balance Exercises: Standing   Standing Eyes Closed Narrow base of support (BOS);Foam/compliant surface;3 reps;30 secs  eyes closed    Tandem Stance Eyes open;3 reps;15 secs           PT Education - 09/04/17 0943    Education provided Yes   Education Details HEp updates, exercise technique and reasoning for increased focus on core today    Person(s) Educated Patient   Methods Explanation;Handout   Comprehension Verbalized understanding;Returned demonstration;Need further instruction          PT Short Term Goals - 08/12/17 1207      PT SHORT TERM GOAL #1   Title Patient to be able to consistently ambulate with heel-toe pattern and improved general foot clearance and knee extension during gait in order to improve gait efficiency and reduce incidence of tripping    Time 2   Period Weeks   Status New   Target Date 08/26/17     PT SHORT TERM GOAL #2   Title Patient to be able to perform 5-10 minutes of moderate level activity without rest break and fatigue no more than 3/10 in order to show improving functional activity tolerance    Time 2   Period Weeks   Status New     PT SHORT TERM GOAL #3   Title Patient to be compliant with correct performance of HEP, to be updated PRN    Time 1   Period Weeks   Status New    Target Date 08/19/17           PT Long Term Goals - 08/12/17 1210      PT LONG TERM GOAL #1   Title Patient to show improvement of at least 1 MMT grade in all tested muscle groups in order to improve  balance and gait pattern    Time 4   Period Weeks   Status New   Target Date 09/09/17     PT LONG TERM GOAL #2   Title Patient to be able to tolerate at least 15-20 minutes of moderate physical activity with no rest breaks and fatigue no more than 3/10 in order to show improved functional activity tolerance and readiness for exercise program   Time 4   Period Weeks   Status New     PT LONG TERM GOAL #3   Title Patient to score at least 49 on Berg balance test in order to show reduced fall risk    Time 4   Period Weeks   Status New     PT LONG TERM GOAL #4   Title Patient to be educated regarding appropriate machines and exercises to perform at the local gym, as well as safe exercise progression principles in order to encourage independence with regular exercise program    Time 4   Period Weeks   Status New               Plan - Sep 19, 2017 0944    Clinical Impression Statement Began session by checking SI alignment, noting that L anterior rotation had returned and corrected with MET today. Otherwise continued with functional hip strengthening on table and in standing this session, as well as functional balance training today. Patient requires mod assist for some hip strengthening exercises due to weakness and form concerns. Increased core work due to display of core weakness likely effecting symptoms. Recommend reassessment within next 1-2 sessions to evaluate progress with skilled PT services.    Rehab Potential Fair   Clinical Impairments Affecting Rehab Potential (+) motivated to participate in PT and start independent exercise program; (-) fair outcomes with PT in past, extensive surgical history, chronicity of pain, sedentary lifestyle    PT Frequency 2x / week   PT  Duration 4 weeks   PT Treatment/Interventions ADLs/Self Care Home Management;Biofeedback;DME Instruction;Gait training;Stair training;Functional mobility training;Therapeutic activities;Therapeutic exercise;Balance training;Neuromuscular re-education;Patient/family education;Orthotic Fit/Training;Manual techniques;Energy conservation   PT Next Visit Plan Increase focus on core. Next session focus on balance, gait pattern, functional hip strength. Continue to assess LLD, SI aligment and perform SI MET PRN and then check LLD following, may recommend heel support for leg discrpancy   PT Home Exercise Plan Eval: tandem stance with light touch, SLS with light touch, bridges, sidelying hip ABD; 2023-09-20- TA sets, supine clams, dead bugs    Consulted and Agree with Plan of Care Patient      Patient will benefit from skilled therapeutic intervention in order to improve the following deficits and impairments:  Abnormal gait, Pain, Decreased coordination, Decreased mobility, Postural dysfunction, Decreased activity tolerance, Decreased strength, Decreased balance, Difficulty walking, Improper body mechanics  Visit Diagnosis: Chronic pain of right knee  Chronic pain of left knee  Unsteadiness on feet  Other abnormalities of gait and mobility  Muscle weakness (generalized)     Problem List Patient Active Problem List   Diagnosis Date Noted  . Midsternal chest pain 08/19/2015  . Bipolar disorder ([place])   . Hypothyroidism 08/04/2015    Class: Diagnosis of  . Chronic cough 11/23/2014  . Fecal incontinence 11/23/2014  . Constipation 09/22/2013  . Hx of adenomatous colonic polyps 09/22/2013  . S/P total knee arthroplasty 04/27/2013  . Knee pain 04/27/2013  . Muscle weakness (generalized) 04/27/2013  . Chronic fatigue 03/05/2013  . Chronic pain syndrome 03/05/2013  .  Osteoarthritis of both knees 03/05/2013  . DJD (degenerative joint disease) of pelvis 03/05/2013  . Vitamin D deficiency 03/05/2013   . Surgery, elective 03/05/2013  . Insomnia 03/05/2013  . Hot flashes 03/05/2013  . History of gastroesophageal reflux (GERD) 03/05/2013  . History of back surgery 03/05/2013  . H/O chest pain 03/05/2013  . Aftercare following joint replacement 08/28/2012  . History of total shoulder replacement 08/28/2012  . Dislocation of hip joint prosthesis ([place]) 02/04/2012  . HTN (hypertension) 10/15/2011  . Degenerative joint disease of pelvic region 09/10/2011  . H/O bilateral hip replacements 07/26/2011  . Right hip pain 07/26/2011  . Difficulty in walking(719.7) 07/26/2011  . Hyperlipidemia 05/11/2009  . BIPOLAR AFFECTIVE DISORDER 05/11/2009  . Coronary artery disease 05/11/2009  . GERD 05/11/2009  . Fibromyalgia 05/11/2009  . DIZZINESS 05/11/2009  . Chest pain 05/11/2009    Deniece Ree PT, DPT [facility] [address] [place], [place], [address] Phone: [phone]   Fax:  [phone]  Name: [name] [name] MRN: [id number] Date of Birth: 1941-03-04

## 2017-09-04 NOTE — Patient Instructions (Signed)
   SUPINE HIP ABDUCTION - ELASTIC BAND CLAMS - CLAMSHELL  Lie down on your back with your knees bent. Place an elastic band around your knees and then draw your knees apart.  Hold for 2 seconds, then relax.  Repeat 10-15 times, 1-2 times per day.     Transverse Abdominus Activation  Lying on your back, pull your bellybutton into your spine.  Hold for 3 seconds then relax.  You can do this exercise laying down, sitting, or standing- whichever is most convenient for you.  Repeat 15-20 times, at least 3 times per day.     Dead Bug  Lie on your back with your knees bent up and feet flat on the table. Keeping the core engaged, slowly lift one knee back, while the opposite arm goes back as well (right leg/left arm and left leg/right arm). Return to neutral and repeat with the other arm and leg.  Repeat 10 times each side, 1-2 times per day.

## 2017-09-05 ENCOUNTER — Encounter (HOSPITAL_COMMUNITY): Payer: Self-pay | Admitting: Physical Therapy

## 2017-09-05 ENCOUNTER — Ambulatory Visit (HOSPITAL_COMMUNITY): Payer: Medicare Other | Admitting: Physical Therapy

## 2017-09-05 DIAGNOSIS — R2689 Other abnormalities of gait and mobility: Secondary | ICD-10-CM

## 2017-09-05 DIAGNOSIS — M6281 Muscle weakness (generalized): Secondary | ICD-10-CM

## 2017-09-05 DIAGNOSIS — G8929 Other chronic pain: Secondary | ICD-10-CM

## 2017-09-05 DIAGNOSIS — M25562 Pain in left knee: Secondary | ICD-10-CM

## 2017-09-05 DIAGNOSIS — M25561 Pain in right knee: Principal | ICD-10-CM

## 2017-09-05 DIAGNOSIS — R2681 Unsteadiness on feet: Secondary | ICD-10-CM

## 2017-09-05 NOTE — Patient Instructions (Signed)
AT THE GYM:  -Nustep machine  - Treadmill  - Bicycle   -refer to personal trainers at [facility] for safe use of strength machines; for strengthening, use a weight that you can lift for 8-10 reps and perform 2-3 sets with breaks in between each set

## 2017-09-05 NOTE — Therapy (Signed)
[place], [place], [address] Phone: [phone]   Fax:  [phone]  Physical Therapy Treatment (Re-Assessment)  Patient Details  Name: [name] [name] MRN: [id number] Date of Birth: 11-Feb-1941 Referring Provider: Sinda [name]   Encounter Date: 09/05/2017      PT End of Session - 09/05/17 0856    Visit Number 6   Number of Visits 14   Date for PT Re-Evaluation 10/03/17   Authorization Type U[employer] Medicare  (G-codes done 6th session)   Authorization Time Period 35/3/29 to 92/4/26; recert 83/41-96/22   Authorization - Visit Number 6   Authorization - Number of Visits 16   PT Start Time 0817   PT Stop Time 0855   PT Time Calculation (min) 38 min   Activity Tolerance Patient tolerated treatment well;Patient limited by fatigue   Behavior During Therapy [facility] for tasks assessed/performed      Past Medical History:  Diagnosis Date  . Arthritis   . Bipolar affective disorder ([place])   . Cataract   . Chronic back pain   . Chronic fatigue   . Dizziness   . Dyslipidemia   . Fibromyalgia   . GERD (gastroesophageal reflux disease)   . H/O: hysterectomy   . Hx of adenomatous colonic polyps 2005  . Hyperlipidemia   . Hypertension   . Knee pain   . Non-obstructive CAD    a. 08/2015 Cath: LM 40, LCX small, 30ost, 29m OM2 small, RCA nl/small, EF 55-65%.  . Osteopenia   . Sciatica     Past Surgical History:  Procedure Laterality Date  . ABDOMINAL HYSTERECTOMY  1994   TAH- DUB, BSO  . [place] bunionectomy     bilateral  . BREAST BIOPSY Left 1989  . BREAST LUMPECTOMY    . BUNIONECTOMY Left 2005  . CARDIAC CATHETERIZATION  2010   +CAD  . CARDIAC CATHETERIZATION N/A 08/19/2015   Procedure: Left Heart Cath and Coronary Angiography;  Surgeon: [name] Crome MD;  [facility];  Service: Cardiovascular;  Laterality: N/A;  . COLONOSCOPY  01/29/2007   RWLN:LGXQJJrectum, left-sided diverticula,  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  Remainder of colonic mucosa was normal  . COLONOSCOPY WITH ESOPHAGOGASTRODUODENOSCOPY (EGD) N/A 02/01/2014   RHER:DEYCerosive reflux/gastric polyp/normal rectum/scattered left sided diverticula, normal distal TI. gastric bx negative, fundic gland polyp. next TCS 01/2019.  .Marland KitchenCYSTECTOMY     pilonidial cyst  . ESOPHAGOGASTRODUODENOSCOPY     followed by colonoscopy with snare polypectomy  . right knee replacement  02/2013  . SHOULDER ARTHROSCOPY Left 2005  . SHOULDER ARTHROSCOPY Right   . SPINAL FUSION    . SPINAL FUSION  2004   L4-L5  . TOTAL HIP ARTHROPLASTY Right 06/2006  . TOTAL HIP ARTHROPLASTY  8/12  . TOTAL SHOULDER REPLACEMENT Left 08/2005    There were no vitals filed for this visit.      Subjective Assessment - 09/05/17 0819    Subjective [name] arrives stating that she feels like she is about 60%; she wants to continue to focus on strength and balance moving forward.    Pertinent History HTN, B hip replacements, fibromyalgia, bipolar, hx total shoulder replacement, hx back pain/surgeries, sciatica, B bunionectomies    Patient Stated Goals get balance better, work on strength, get HEP/independent exercise program    Currently in Pain? Yes   Pain Score 5    Pain Location Knee   Pain Orientation Right;Left  Pain Descriptors / Indicators Aching            [facility] PT Assessment - 09/05/17 0001      Assessment   Medical Diagnosis OA    Referring Provider [name] [name]    Onset Date/Surgical Date --  chronic    Next MD Visit Dr. Luan [name] in january 2019   Prior Therapy PT at this clinic      Precautions   Precautions Fall     Restrictions   Weight Bearing Restrictions No     Balance Screen   Has the patient fallen in the past 6 months No   Has the patient had a decrease in activity level because of a fear of falling?  Yes   Is the patient reluctant to leave their home because of a fear of falling?  No      Prior Function   Level of Independence Independent;Independent with basic ADLs;Independent with gait;Independent with transfers   Vocation Retired     Strength   Right Hip Flexion 4/5   Right Hip Extension 3/5   Right Hip ABduction 3-/5   Left Hip Flexion 4/5   Left Hip Extension 3+/5   Left Hip ABduction 3/5   Right Knee Flexion 5/5   Right Knee Extension 5/5   Left Knee Flexion 5/5   Left Knee Extension 5/5   Right Ankle Dorsiflexion 5/5   Left Ankle Dorsiflexion 5/5     6 minute walk test results    Aerobic Endurance Distance PQZRAQ 762   Endurance additional comments 3MWT SPC      Berg Balance Test   Sit to Stand Able to stand without using hands and stabilize independently   Standing Unsupported Able to stand safely 2 minutes   Sitting with Back Unsupported but Feet Supported on Floor or Stool Able to sit safely and securely 2 minutes   Stand to Sit Sits safely with minimal use of hands   Transfers Able to transfer safely, minor use of hands   Standing Unsupported with Eyes Closed Able to stand 10 seconds safely   Standing Ubsupported with Feet Together Able to place feet together independently and stand 1 minute safely   From Standing, Reach Forward with Outstretched Arm Can reach confidently >25 cm (10")   From Standing Position, Pick up Object from Floor Able to pick up shoe safely and easily   From Standing Position, Turn to Look Behind Over each Shoulder Looks behind one side only/other side shows less weight shift   Turn 360 Degrees Able to turn 360 degrees safely in 4 seconds or less   Standing Unsupported, Alternately Place Feet on Step/Stool Able to complete >2 steps/needs minimal assist   Standing Unsupported, One Foot in Front Needs help to step but can hold 15 seconds   Standing on One Leg Able to lift leg independently and hold equal to or more than 3 seconds   Total Score 47                     [facility] Adult PT Treatment/Exercise - 09/05/17 0001       Knee/Hip Exercises: Supine   Bridges Both;2 sets;10 reps   Bridges Limitations staggered position B    Straight Leg Raises Both;1 set;15 reps   Straight Leg Raises Limitations core set    Other Supine Knee/Hip Exercises supine hip ABD with red TB 1x15 mod assist for form    Other Supine Knee/Hip Exercises TA sets 1x20 3  second holds              PT Education - 09/05/17 0856    Education provided Yes   Education Details progress with skilled PT services, POC moving forward, cardio machines and strength training at gym   Person(s) Educated Patient   Methods Explanation   Comprehension Verbalized understanding          PT Short Term Goals - 09/05/17 0839      PT SHORT TERM GOAL #1   Title Patient to be able to consistently ambulate with heel-toe pattern and improved general foot clearance and knee extension during gait in order to improve gait efficiency and reduce incidence of tripping    Baseline 10/25- much improved    Time 2   Period Weeks   Status Achieved     PT SHORT TERM GOAL #2   Title Patient to be able to perform 5-10 minutes of moderate level activity without rest break and fatigue no more than 3/10 in order to show improving functional activity tolerance    Baseline 10/25- continues to fatigue easily, but improving    Time 2   Period Weeks   Status On-going     PT SHORT TERM GOAL #3   Title Patient to be compliant with correct performance of HEP, to be updated PRN    Baseline 10/25- complaint    Time 1   Period Weeks   Status Achieved           PT Long Term Goals - 09/05/17 0840      PT LONG TERM GOAL #1   Title Patient to show improvement of at least 1 MMT grade in all tested muscle groups in order to improve balance and gait pattern    Baseline 10/25- improving    Time 4   Period Weeks   Status On-going     PT LONG TERM GOAL #2   Title Patient to be able to tolerate at least 15-20 minutes of moderate physical activity with no rest  breaks and fatigue no more than 3/10 in order to show improved functional activity tolerance and readiness for exercise program   Baseline 10/25- ongoing    Time 4   Period Weeks   Status On-going     PT LONG TERM GOAL #3   Title Patient to score at least 49 on Berg balance test in order to show reduced fall risk    Baseline 10/25- 47   Time 4   Period Weeks   Status On-going     PT LONG TERM GOAL #4   Title Patient to be educated regarding appropriate machines and exercises to perform at the local gym, as well as safe exercise progression principles in order to encourage independence with regular exercise program    Baseline 10/25- ongoing    Time 4   Period Weeks   Status On-going               Plan - 09/05/17 0857    Clinical Impression Statement Re-assessment performed today. Patient does show minor improvement with functional strength, shows improved 3MWT distance (however consider she was using a cane this time), and most markedly balance improvement. She continues to be limited by considerable core and proximal muscle weakness possible contributing to LLD, balance impairment, and functional activity tolerance deficits. Recommend continuation of skilled PT services to address functional deficits and continue to improve level of function moving forward.    Rehab Potential Fair  Clinical Impairments Affecting Rehab Potential (+) motivated to participate in PT and start independent exercise program; (-) fair outcomes with PT in past, extensive surgical history, chronicity of pain, sedentary lifestyle    PT Frequency 2x / week   PT Duration 4 weeks   PT Treatment/Interventions ADLs/Self Care Home Management;Biofeedback;DME Instruction;Gait training;Stair training;Functional mobility training;Therapeutic activities;Therapeutic exercise;Balance training;Neuromuscular re-education;Patient/family education;Orthotic Fit/Training;Manual techniques;Energy conservation   PT Next Visit  Plan Next session focus on balance, gait pattern, functional hip strength. Continue to assess LLD, SI aligment and perform SI MET PRN and then check LLD following, may recommend heel support for leg discrpancy   PT Home Exercise Plan Eval: tandem stance with light touch, SLS with light touch, bridges, sidelying hip ABD; Sep 29, 2023- TA sets, supine clams, dead bugs    Consulted and Agree with Plan of Care Patient      Patient will benefit from skilled therapeutic intervention in order to improve the following deficits and impairments:  Abnormal gait, Pain, Decreased coordination, Decreased mobility, Postural dysfunction, Decreased activity tolerance, Decreased strength, Decreased balance, Difficulty walking, Improper body mechanics  Visit Diagnosis: Chronic pain of right knee - Plan: PT plan of care cert/re-cert  Chronic pain of left knee - Plan: PT plan of care cert/re-cert  Unsteadiness on feet - Plan: PT plan of care cert/re-cert  Other abnormalities of gait and mobility - Plan: PT plan of care cert/re-cert  Muscle weakness (generalized) - Plan: PT plan of care cert/re-cert       G-Codes - 09-29-2017 5597    Functional Assessment Tool Used (Outpatient Only) Based on skilled clinical assessment of strength, gait, balance, fall risk    Functional Limitation Mobility: Walking and moving around   Mobility: Walking and Moving Around Current Status (C1638) At least 40 percent but less than 60 percent impaired, limited or restricted   Mobility: Walking and Moving Around Goal Status (G5364) At least 40 percent but less than 60 percent impaired, limited or restricted      Problem List Patient Active Problem List   Diagnosis Date Noted  . Midsternal chest pain 08/19/2015  . Bipolar disorder ([place])   . Hypothyroidism 08/04/2015    Class: Diagnosis of  . Chronic cough 11/23/2014  . Fecal incontinence 11/23/2014  . Constipation 09/22/2013  . Hx of adenomatous colonic polyps 09/22/2013  . S/P  total knee arthroplasty 04/27/2013  . Knee pain 04/27/2013  . Muscle weakness (generalized) 04/27/2013  . Chronic fatigue 03/05/2013  . Chronic pain syndrome 03/05/2013  . Osteoarthritis of both knees 03/05/2013  . DJD (degenerative joint disease) of pelvis 03/05/2013  . Vitamin D deficiency 03/05/2013  . Surgery, elective 03/05/2013  . Insomnia 03/05/2013  . Hot flashes 03/05/2013  . History of gastroesophageal reflux (GERD) 03/05/2013  . History of back surgery 03/05/2013  . H/O chest pain 03/05/2013  . Aftercare following joint replacement 08/28/2012  . History of total shoulder replacement 08/28/2012  . Dislocation of hip joint prosthesis ([place]) 02/04/2012  . HTN (hypertension) 10/15/2011  . Degenerative joint disease of pelvic region 09/10/2011  . H/O bilateral hip replacements 07/26/2011  . Right hip pain 07/26/2011  . Difficulty in walking(719.7) 07/26/2011  . Hyperlipidemia 05/11/2009  . BIPOLAR AFFECTIVE DISORDER 05/11/2009  . Coronary artery disease 05/11/2009  . GERD 05/11/2009  . Fibromyalgia 05/11/2009  . DIZZINESS 05/11/2009  . Chest pain 05/11/2009    Deniece Ree PT, DPT [facility] [address] [place], [place], [address] Phone: [phone]  Fax:  [phone]  Name: [name] [name] MRN: [id number] Date of Birth: 1941/07/19

## 2017-09-10 ENCOUNTER — Ambulatory Visit (HOSPITAL_COMMUNITY): Payer: Medicare Other | Admitting: Physical Therapy

## 2017-09-10 ENCOUNTER — Telehealth (HOSPITAL_COMMUNITY): Payer: Self-pay | Admitting: Pulmonary Disease

## 2017-09-10 NOTE — Telephone Encounter (Signed)
09/10/17  left a message that she wasn't feeling well enough to come to therapy this morning

## 2017-09-12 ENCOUNTER — Encounter (HOSPITAL_COMMUNITY): Payer: Self-pay

## 2017-09-12 ENCOUNTER — Ambulatory Visit (HOSPITAL_COMMUNITY): Payer: Medicare Other | Attending: Pulmonary Disease

## 2017-09-12 DIAGNOSIS — M6281 Muscle weakness (generalized): Secondary | ICD-10-CM | POA: Insufficient documentation

## 2017-09-12 DIAGNOSIS — R2689 Other abnormalities of gait and mobility: Secondary | ICD-10-CM | POA: Diagnosis present

## 2017-09-12 DIAGNOSIS — R2681 Unsteadiness on feet: Secondary | ICD-10-CM | POA: Diagnosis present

## 2017-09-12 DIAGNOSIS — G8929 Other chronic pain: Secondary | ICD-10-CM | POA: Insufficient documentation

## 2017-09-12 DIAGNOSIS — M25561 Pain in right knee: Secondary | ICD-10-CM | POA: Diagnosis not present

## 2017-09-12 DIAGNOSIS — M25562 Pain in left knee: Secondary | ICD-10-CM | POA: Diagnosis present

## 2017-09-12 NOTE — Therapy (Signed)
[place], [place], [address] Phone: [phone]   Fax:  [phone]  Physical Therapy Treatment  Patient Details  Name: [name] [name] MRN: [id number] Date of Birth: [birth year] Referring Provider: Sinda Du  Encounter Date: 09/12/2017      PT End of Session - 09/12/17 1523    Visit Number 7   Number of Visits 14   Date for PT Re-Evaluation 10/03/17   Authorization Type UHC Medicare  (G-codes done 6th session)   Authorization Time Period 82/5/05 to 39/7/67; recert 34/19-37/90   Authorization - Visit Number 7   Authorization - Number of Visits 16   PT Start Time 2409   PT Stop Time 1558   PT Time Calculation (min) 39 min   Equipment Utilized During Treatment Gait belt   Activity Tolerance Patient tolerated treatment well;Patient limited by fatigue   Behavior During Therapy [facility] for tasks assessed/performed      Past Medical History:  Diagnosis Date  . Arthritis   . Bipolar affective disorder ([place])   . Cataract   . Chronic back pain   . Chronic fatigue   . Dizziness   . Dyslipidemia   . Fibromyalgia   . GERD (gastroesophageal reflux disease)   . H/O: hysterectomy   . Hx of adenomatous colonic polyps 2005  . Hyperlipidemia   . Hypertension   . Knee pain   . Non-obstructive CAD    a. 08/2015 Cath: LM 40, LCX small, 30ost, 58m, OM2 small, RCA nl/small, EF 55-65%.  . Osteopenia   . Sciatica     Past Surgical History:  Procedure Laterality Date  . ABDOMINAL HYSTERECTOMY  1994   TAH- DUB, BSO  . [place] bunionectomy     bilateral  . BREAST BIOPSY Left 1989  . BREAST LUMPECTOMY    . BUNIONECTOMY Left 2005  . CARDIAC CATHETERIZATION  2010   +CAD  . CARDIAC CATHETERIZATION N/A 08/19/2015   Procedure: Left Heart Cath and Coronary Angiography;  Surgeon: Belva Crome, MD;  Location: [facility];  Service: Cardiovascular;  Laterality: N/A;  . COLONOSCOPY  01/29/2007   BDZ:HGDJME rectum,  left-sided diverticula, 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  Remainder of colonic mucosa was normal  . COLONOSCOPY WITH ESOPHAGOGASTRODUODENOSCOPY (EGD) N/A 02/01/2014   QAS:TMHD erosive reflux/gastric polyp/normal rectum/scattered left sided diverticula, normal distal TI. gastric bx negative, fundic gland polyp. next TCS 01/2019.  Marland Kitchen CYSTECTOMY     pilonidial cyst  . ESOPHAGOGASTRODUODENOSCOPY     followed by colonoscopy with snare polypectomy  . right knee replacement  02/2013  . SHOULDER ARTHROSCOPY Left 2005  . SHOULDER ARTHROSCOPY Right   . SPINAL FUSION    . SPINAL FUSION  2004   L4-L5  . TOTAL HIP ARTHROPLASTY Right 06/2006  . TOTAL HIP ARTHROPLASTY  8/12  . TOTAL SHOULDER REPLACEMENT Left 08/2005    There were no vitals filed for this visit.      Subjective Assessment - 09/12/17 1522    Subjective Pt stated she has completed a lot of housework today, reports increased pain Bil knees Lt>Rt pain scale at least a 5/10.   Pertinent History HTN, B hip replacements, fibromyalgia, bipolar, hx total shoulder replacement, hx back pain/surgeries, sciatica, B bunionectomies    Patient Stated Goals get balance better, work on strength, get HEP/independent exercise program    Currently in Pain? Yes   Pain Score 5    Pain Location Knee   Pain  Orientation Left;Right   Pain Descriptors / Indicators Sore;Aching   Pain Type Chronic pain   Pain Onset More than a month ago   Pain Frequency Constant   Aggravating Factors  cold weather, weight bearing   Pain Relieving Factors heat or ice   Effect of Pain on Daily Activities none            [facility] PT Assessment - 09/12/17 0001      Assessment   Medical Diagnosis OA    Referring Provider Sinda Du   Onset Date/Surgical Date --  chronic/ongoing   Next MD Visit Dr. Luan Pulling in january 2019   Prior Therapy PT at this clinic      Precautions   Precautions Fall     Observation/Other Assessments   Observations  LLD from ASIS to medial malleoli Lt 79cm/ Rt at 77cm.  Pt advised to purchase heel support for Rt LE                     [facility] Adult PT Treatment/Exercise - 09/12/17 0001      Knee/Hip Exercises: Supine   Bridges Both;2 sets;10 reps   Bridges Limitations staggered position B      Knee/Hip Exercises: Sidelying   Hip ABduction 10 reps   Hip ABduction Limitations cueing for form             Balance Exercises - 09/12/17 1607      Balance Exercises: Standing   Tandem Stance Eyes open;3 reps;30 secs;Foam/compliant surface   Sidestepping 3 reps;Theraband  RTB             PT Short Term Goals - 09/05/17 0839      PT SHORT TERM GOAL #1   Title Patient to be able to consistently ambulate with heel-toe pattern and improved general foot clearance and knee extension during gait in order to improve gait efficiency and reduce incidence of tripping    Baseline 10/25- much improved    Time 2   Period Weeks   Status Achieved     PT SHORT TERM GOAL #2   Title Patient to be able to perform 5-10 minutes of moderate level activity without rest break and fatigue no more than 3/10 in order to show improving functional activity tolerance    Baseline 10/25- continues to fatigue easily, but improving    Time 2   Period Weeks   Status On-going     PT SHORT TERM GOAL #3   Title Patient to be compliant with correct performance of HEP, to be updated PRN    Baseline 10/25- complaint    Time 1   Period Weeks   Status Achieved           PT Long Term Goals - 09/05/17 0840      PT LONG TERM GOAL #1   Title Patient to show improvement of at least 1 MMT grade in all tested muscle groups in order to improve balance and gait pattern    Baseline 10/25- improving    Time 4   Period Weeks   Status On-going     PT LONG TERM GOAL #2   Title Patient to be able to tolerate at least 15-20 minutes of moderate physical activity with no rest breaks and fatigue no more than 3/10 in  order to show improved functional activity tolerance and readiness for exercise program   Baseline 10/25- ongoing    Time 4   Period Weeks   Status On-going  PT LONG TERM GOAL #3   Title Patient to score at least 49 on Berg balance test in order to show reduced fall risk    Baseline 10/25- 47   Time 4   Period Weeks   Status On-going     PT LONG TERM GOAL #4   Title Patient to be educated regarding appropriate machines and exercises to perform at the local gym, as well as safe exercise progression principles in order to encourage independence with regular exercise program    Baseline 10/25- ongoing    Time 4   Period Weeks   Status On-going               Plan - 09/12/17 1543    Clinical Impression Statement Pt limited by Bil knee pain today Lt>Rt so limited weight bearing activities today.  Checked SI alignment with good alignment noted, pt does present with 2cm leg length discrepancy.  Pt encouraged to begin with 1/8in heel support for Rt LE and increase gradually to assist with hip pain and improve gait mechanicsm, pt given written instructions to assist with purchase.  Therex focus on proximal strengthening with cueing for form and to reduce compensation due to weakness.  Min A with balance activities this session for safety.  Pt was limited by fatigue, no reports of increased pain.     Rehab Potential Fair   Clinical Impairments Affecting Rehab Potential (+) motivated to participate in PT and start independent exercise program; (-) fair outcomes with PT in past, extensive surgical history, chronicity of pain, sedentary lifestyle    PT Frequency 2x / week   PT Duration 4 weeks   PT Treatment/Interventions ADLs/Self Care Home Management;Biofeedback;DME Instruction;Gait training;Stair training;Functional mobility training;Therapeutic activities;Therapeutic exercise;Balance training;Neuromuscular re-education;Patient/family education;Orthotic Fit/Training;Manual  techniques;Energy conservation   PT Next Visit Plan Next session focus on balance, gait pattern, functional hip strength. F/U with purchase of heel support for Rt LE as there is a .75in/2cm LLD Lt>Rt   PT Home Exercise Plan Eval: tandem stance with light touch, SLS with light touch, bridges, sidelying hip ABD; 09/10/23- TA sets, supine clams, dead bugs       Patient will benefit from skilled therapeutic intervention in order to improve the following deficits and impairments:  Abnormal gait, Pain, Decreased coordination, Decreased mobility, Postural dysfunction, Decreased activity tolerance, Decreased strength, Decreased balance, Difficulty walking, Improper body mechanics  Visit Diagnosis: Chronic pain of right knee  Chronic pain of left knee  Unsteadiness on feet  Other abnormalities of gait and mobility  Muscle weakness (generalized)     Problem List Patient Active Problem List   Diagnosis Date Noted  . Midsternal chest pain 08/19/2015  . Bipolar disorder ([place])   . Hypothyroidism 08/04/2015    Class: Diagnosis of  . Chronic cough 11/23/2014  . Fecal incontinence 11/23/2014  . Constipation 09/22/2013  . Hx of adenomatous colonic polyps 09/22/2013  . S/P total knee arthroplasty 04/27/2013  . Knee pain 04/27/2013  . Muscle weakness (generalized) 04/27/2013  . Chronic fatigue 03/05/2013  . Chronic pain syndrome 03/05/2013  . Osteoarthritis of both knees 03/05/2013  . DJD (degenerative joint disease) of pelvis 03/05/2013  . Vitamin D deficiency 03/05/2013  . Surgery, elective 03/05/2013  . Insomnia 03/05/2013  . Hot flashes 03/05/2013  . History of gastroesophageal reflux (GERD) 03/05/2013  . History of back surgery 03/05/2013  . H/O chest pain 03/05/2013  . Aftercare following joint replacement 08/28/2012  . History of total shoulder replacement 08/28/2012  . Dislocation  of hip joint prosthesis ([place]) 02/04/2012  . HTN (hypertension) 10/15/2011  . Degenerative joint  disease of pelvic region 09/10/2011  . H/O bilateral hip replacements 07/26/2011  . Right hip pain 07/26/2011  . Difficulty in walking(719.7) 07/26/2011  . Hyperlipidemia 05/11/2009  . BIPOLAR AFFECTIVE DISORDER 05/11/2009  . Coronary artery disease 05/11/2009  . GERD 05/11/2009  . Fibromyalgia 05/11/2009  . DIZZINESS 05/11/2009  . Chest pain 05/11/2009   Ihor Austin, [place]; [place]  Aldona Lento 09/12/2017, [place] [address] [place], [place], [address] Phone: [phone]   Fax:  [phone]  Name: [name] [name] MRN: [id number] Date of Birth: 05-Jul-1941

## 2017-09-16 ENCOUNTER — Encounter (HOSPITAL_COMMUNITY): Payer: Self-pay | Admitting: Physical Therapy

## 2017-09-16 ENCOUNTER — Ambulatory Visit (HOSPITAL_COMMUNITY): Payer: Medicare Other | Admitting: Physical Therapy

## 2017-09-16 VITALS — BP 152/89 | HR 83

## 2017-09-16 DIAGNOSIS — R2689 Other abnormalities of gait and mobility: Secondary | ICD-10-CM

## 2017-09-16 DIAGNOSIS — M6281 Muscle weakness (generalized): Secondary | ICD-10-CM

## 2017-09-16 DIAGNOSIS — R2681 Unsteadiness on feet: Secondary | ICD-10-CM

## 2017-09-16 DIAGNOSIS — M25561 Pain in right knee: Principal | ICD-10-CM

## 2017-09-16 DIAGNOSIS — G8929 Other chronic pain: Secondary | ICD-10-CM

## 2017-09-16 DIAGNOSIS — M25562 Pain in left knee: Secondary | ICD-10-CM

## 2017-09-16 NOTE — Therapy (Signed)
[place], [place], [address] Phone: [phone]   Fax:  [phone]  Physical Therapy Treatment  Patient Details  Name: [name] [name] MRN: [id number] Date of Birth: 23-Jan-1941 Referring Provider: Sinda Du   Encounter Date: 09/16/2017  PT End of Session - 09/16/17 0900    Visit Number  8    Number of Visits  14    Date for PT Re-Evaluation  10/03/17    Authorization Type  [employer] Medicare  (G-codes done 6th session)    Authorization Time Period  30/0/92 to 33/0/07; recert 62/26-33/35    Authorization - Visit Number  8    Authorization - Number of Visits  16    PT Start Time  0819    PT Stop Time  0857    PT Time Calculation (min)  38 min    Activity Tolerance  Patient tolerated treatment well;Patient limited by fatigue    Behavior During Therapy  [facility] for tasks assessed/performed       Past Medical History:  Diagnosis Date  . Arthritis   . Bipolar affective disorder ([place])   . Cataract   . Chronic back pain   . Chronic fatigue   . Dizziness   . Dyslipidemia   . Fibromyalgia   . GERD (gastroesophageal reflux disease)   . H/O: hysterectomy   . Hx of adenomatous colonic polyps 2005  . Hyperlipidemia   . Hypertension   . Knee pain   . Non-obstructive CAD    a. 08/2015 Cath: LM 40, LCX small, 30ost, 51m, OM2 small, RCA nl/small, EF 55-65%.  . Osteopenia   . Sciatica     Past Surgical History:  Procedure Laterality Date  . ABDOMINAL HYSTERECTOMY  1994   TAH- DUB, BSO  . [place] bunionectomy     bilateral  . BREAST BIOPSY Left 1989  . BREAST LUMPECTOMY    . BUNIONECTOMY Left 2005  . CARDIAC CATHETERIZATION  2010   +CAD  . COLONOSCOPY  01/29/2007   [id number] rectum, left-sided diverticula, 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  Remainder of colonic mucosa was normal  . CYSTECTOMY     pilonidial cyst  . ESOPHAGOGASTRODUODENOSCOPY     followed by colonoscopy with snare  polypectomy  . right knee replacement  02/2013  . SHOULDER ARTHROSCOPY Left 2005  . SHOULDER ARTHROSCOPY Right   . SPINAL FUSION    . SPINAL FUSION  2004   L4-L5  . TOTAL HIP ARTHROPLASTY Right 06/2006  . TOTAL HIP ARTHROPLASTY  8/12  . TOTAL SHOULDER REPLACEMENT Left 08/2005    Vitals:   09/16/17 0822  BP: (!) 152/89  Pulse: 83    Subjective Assessment - 09/16/17 0822    Subjective  patient arrives stating she was busy with her grandkids this weekend, she has not been doing her HEP, "I guess I've been lazy". No falls or anything recently. She has not gotten her heel lift yet.     Pertinent History  HTN, B hip replacements, fibromyalgia, bipolar, hx total shoulder replacement, hx back pain/surgeries, sciatica, B bunionectomies     Patient Stated Goals  get balance better, work on strength, get HEP/independent exercise program     Currently in Pain?  Yes    Pain Score  5     Pain Location  Knee    Pain Orientation  Right;Left    Pain Descriptors / Indicators  Aching  [facility] Adult PT Treatment/Exercise - 09/16/17 0001      Knee/Hip Exercises: Standing   Lateral Step Up  Both;1 set;15 reps;Step Height: 2"    Forward Step Up  Both;1 set;15 reps;Hand Hold: 2 2 inch box; attempted 4 inch but this was painful    2 inch box; attempted 4 inch but this was painful      Knee/Hip Exercises: Supine   Other Supine Knee/Hip Exercises  supine TA sets 1x15 5 second holds; dead bugs 1x15; supine core set marches 1x15     Other Supine Knee/Hip Exercises  supine hip ABD red TB assist for form 1x15 B       Knee/Hip Exercises: Prone   Hip Extension  Both;1 set;15 reps          Balance Exercises - 09/16/17 0849      Balance Exercises: Standing   Tandem Stance  Eyes closed;2 reps;20 secs;Foam/compliant surface    Tandem Gait  Forward;3 reps;Other (comment) // bars    // bars    Retro Gait  3 reps;Other (comment) in // bars    in // bars        PT  Education - 09/16/17 0900    Education provided  Yes    Education Details  improtance of regular BP checks, HEP compliance     Person(s) Educated  Patient    Methods  Explanation    Comprehension  Verbalized understanding;Need further instruction       PT Short Term Goals - 09/05/17 0839      PT SHORT TERM GOAL #1   Title  Patient to be able to consistently ambulate with heel-toe pattern and improved general foot clearance and knee extension during gait in order to improve gait efficiency and reduce incidence of tripping     Baseline  10/25- much improved     Time  2    Period  Weeks    Status  Achieved      PT SHORT TERM GOAL #2   Title  Patient to be able to perform 5-10 minutes of moderate level activity without rest break and fatigue no more than 3/10 in order to show improving functional activity tolerance     Baseline  10/25- continues to fatigue easily, but improving     Time  2    Period  Weeks    Status  On-going      PT SHORT TERM GOAL #3   Title  Patient to be compliant with correct performance of HEP, to be updated PRN     Baseline  10/25- complaint     Time  1    Period  Weeks    Status  Achieved        PT Long Term Goals - 09/05/17 0840      PT LONG TERM GOAL #1   Title  Patient to show improvement of at least 1 MMT grade in all tested muscle groups in order to improve balance and gait pattern     Baseline  10/25- improving     Time  4    Period  Weeks    Status  On-going      PT LONG TERM GOAL #2   Title  Patient to be able to tolerate at least 15-20 minutes of moderate physical activity with no rest breaks and fatigue no more than 3/10 in order to show improved functional activity tolerance and readiness for exercise program    Baseline  10/25- ongoing  Time  4    Period  Weeks    Status  On-going      PT LONG TERM GOAL #3   Title  Patient to score at least 49 on Berg balance test in order to show reduced fall risk     Baseline  10/25- 47     Time  4    Period  Weeks    Status  On-going      PT LONG TERM GOAL #4   Title  Patient to be educated regarding appropriate machines and exercises to perform at the local gym, as well as safe exercise progression principles in order to encourage independence with regular exercise program     Baseline  10/25- ongoing     Time  4    Period  Weeks    Status  On-going            Plan - 09/16/17 0902    Clinical Impression Statement  Patient arrives today reporting she has been doing well but has not been compliant with HEP; discussed method to increase compliance with HEP before working further on core strength, CKC exercise, and balance this session. Continued to encourage patient to follow up with heel lift as well. Noted considerable valgus angle B knees today as well as severe functional weakness with CKC exercise.     Clinical Decision Making  Low    Rehab Potential  Fair    Clinical Impairments Affecting Rehab Potential  (+) motivated to participate in PT and start independent exercise program; (-) fair outcomes with PT in past, extensive surgical history, chronicity of pain, sedentary lifestyle     PT Frequency  2x / week    PT Duration  4 weeks    PT Treatment/Interventions  ADLs/Self Care Home Management;Biofeedback;DME Instruction;Gait training;Stair training;Functional mobility training;Therapeutic activities;Therapeutic exercise;Balance training;Neuromuscular re-education;Patient/family education;Orthotic Fit/Training;Manual techniques;Energy conservation    PT Next Visit Plan  Next session focus on balance, gait pattern, functional hip strength. F/U with purchase of heel support for Rt LE as there is a .75in/2cm LLD Lt>Rt. Continue CKC exercise     PT Home Exercise Plan  Eval: tandem stance with light touch, SLS with light touch, bridges, sidelying hip ABD; 10-01-23- TA sets, supine clams, dead bugs        Patient will benefit from skilled therapeutic intervention in order to  improve the following deficits and impairments:  Abnormal gait, Pain, Decreased coordination, Decreased mobility, Postural dysfunction, Decreased activity tolerance, Decreased strength, Decreased balance, Difficulty walking, Improper body mechanics  Visit Diagnosis: Chronic pain of right knee  Chronic pain of left knee  Unsteadiness on feet  Other abnormalities of gait and mobility  Muscle weakness (generalized)     Problem List Patient Active Problem List   Diagnosis Date Noted  . Midsternal chest pain 08/19/2015  . Bipolar disorder ([place])   . Hypothyroidism 08/04/2015    Class: Diagnosis of  . Chronic cough 11/23/2014  . Fecal incontinence 11/23/2014  . Constipation 09/22/2013  . Hx of adenomatous colonic polyps 09/22/2013  . S/P total knee arthroplasty 04/27/2013  . Knee pain 04/27/2013  . Muscle weakness (generalized) 04/27/2013  . Chronic fatigue 03/05/2013  . Chronic pain syndrome 03/05/2013  . Osteoarthritis of both knees 03/05/2013  . DJD (degenerative joint disease) of pelvis 03/05/2013  . Vitamin D deficiency 03/05/2013  . Surgery, elective 03/05/2013  . Insomnia 03/05/2013  . Hot flashes 03/05/2013  . History of gastroesophageal reflux (GERD) 03/05/2013  . History of  back surgery 03/05/2013  . H/O chest pain 03/05/2013  . Aftercare following joint replacement 08/28/2012  . History of total shoulder replacement 08/28/2012  . Dislocation of hip joint prosthesis ([place]) 02/04/2012  . HTN (hypertension) 10/15/2011  . Degenerative joint disease of pelvic region 09/10/2011  . H/O bilateral hip replacements 07/26/2011  . Right hip pain 07/26/2011  . Difficulty in walking(719.7) 07/26/2011  . Hyperlipidemia 05/11/2009  . BIPOLAR AFFECTIVE DISORDER 05/11/2009  . Coronary artery disease 05/11/2009  . GERD 05/11/2009  . Fibromyalgia 05/11/2009  . DIZZINESS 05/11/2009  . Chest pain 05/11/2009    Deniece Ree PT, DPT [place] [address] [place], [place], [address] Phone: [phone]   Fax:  [phone]  Name: [name] [name] MRN: [id number] Date of Birth: 1941-06-16

## 2017-09-19 ENCOUNTER — Ambulatory Visit (HOSPITAL_COMMUNITY): Payer: Medicare Other

## 2017-09-19 ENCOUNTER — Encounter (HOSPITAL_COMMUNITY): Payer: Self-pay

## 2017-09-19 DIAGNOSIS — M25561 Pain in right knee: Secondary | ICD-10-CM | POA: Diagnosis not present

## 2017-09-19 DIAGNOSIS — M25562 Pain in left knee: Secondary | ICD-10-CM

## 2017-09-19 DIAGNOSIS — R2689 Other abnormalities of gait and mobility: Secondary | ICD-10-CM

## 2017-09-19 DIAGNOSIS — M6281 Muscle weakness (generalized): Secondary | ICD-10-CM

## 2017-09-19 DIAGNOSIS — R2681 Unsteadiness on feet: Secondary | ICD-10-CM

## 2017-09-19 DIAGNOSIS — G8929 Other chronic pain: Secondary | ICD-10-CM

## 2017-09-19 NOTE — Therapy (Signed)
[place], [place], [address] Phone: [phone]   Fax:  [phone]  Physical Therapy Treatment  Patient Details  Name: [name] [name] MRN: [id number] Date of Birth: 10-Jan-1941 Referring Provider: Sinda Du   Encounter Date: 09/19/2017  PT End of Session - 09/19/17 0857    Visit Number  9    Number of Visits  14    Date for PT Re-Evaluation  10/03/17    Authorization Type  [employer] Medicare  (G-codes done 6th session)    Authorization Time Period  69/6/29 to 52/8/41; recert 32/44-01/02    Authorization - Visit Number  9    Authorization - Number of Visits  16    PT Start Time  0817    PT Stop Time  0855    PT Time Calculation (min)  38 min    Activity Tolerance  Patient tolerated treatment well;Patient limited by fatigue    Behavior During Therapy  [facility] for tasks assessed/performed       Past Medical History:  Diagnosis Date  . Arthritis   . Bipolar affective disorder ([place])   . Cataract   . Chronic back pain   . Chronic fatigue   . Dizziness   . Dyslipidemia   . Fibromyalgia   . GERD (gastroesophageal reflux disease)   . H/O: hysterectomy   . Hx of adenomatous colonic polyps 2005  . Hyperlipidemia   . Hypertension   . Knee pain   . Non-obstructive CAD    a. 08/2015 Cath: LM 40, LCX small, 30ost, 61m, OM2 small, RCA nl/small, EF 55-65%.  . Osteopenia   . Sciatica     Past Surgical History:  Procedure Laterality Date  . ABDOMINAL HYSTERECTOMY  1994   TAH- DUB, BSO  . [place] bunionectomy     bilateral  . BREAST BIOPSY Left 1989  . BREAST LUMPECTOMY    . BUNIONECTOMY Left 2005  . CARDIAC CATHETERIZATION  2010   +CAD  . COLONOSCOPY  01/29/2007   [id number] rectum, left-sided diverticula, 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  Remainder of colonic mucosa was normal  . CYSTECTOMY     pilonidial cyst  . ESOPHAGOGASTRODUODENOSCOPY     followed by colonoscopy with snare  polypectomy  . right knee replacement  02/2013  . SHOULDER ARTHROSCOPY Left 2005  . SHOULDER ARTHROSCOPY Right   . SPINAL FUSION    . SPINAL FUSION  2004   L4-L5  . TOTAL HIP ARTHROPLASTY Right 06/2006  . TOTAL HIP ARTHROPLASTY  8/12  . TOTAL SHOULDER REPLACEMENT Left 08/2005    There were no vitals filed for this visit.  Subjective Assessment - 09/19/17 0821    Subjective  Patient notes standing yesterday for about an hour shopping and both knees are a lot more sore today, left more than right.     Pain Score  6     Pain Location  Knee                      [facility] Adult PT Treatment/Exercise - 09/19/17 0001      Knee/Hip Exercises: Standing   Lateral Step Up  Both;1 set;15 reps;Step Height: 2"    Forward Step Up  Both;1 set;15 reps;Hand Hold: 2      Knee/Hip Exercises: Supine   Straight Leg Raises  Both;1 set;15 reps    Straight Leg Raises Limitations  core set     Other Supine Knee/Hip Exercises  supine TA sets 1x15 5 second holds; dead bugs 1x15; supine core set marches 1x15     Other Supine Knee/Hip Exercises  supine hip ABD red TB assist for form 1x15 B       Knee/Hip Exercises: Sidelying   Hip ABduction  15 reps    Hip ABduction Limitations  cueing for form      Manual Therapy   Manual Therapy  Soft tissue mobilization    Manual therapy comments  completed separate rest of treatment    Soft tissue mobilization  Bilateral knee anteior and posterior mm for pain management; stretching bilateral hamstrings 2 minutes each             PT Education - 09/19/17 0845    Education provided  Yes    Education Details  Added hamstring stretch to HEP    Person(s) Educated  Patient    Methods  Handout;Explanation;Demonstration    Comprehension  Verbalized understanding       PT Short Term Goals - 09/05/17 0839      PT SHORT TERM GOAL #1   Title  Patient to be able to consistently ambulate with heel-toe pattern and improved general foot clearance and knee  extension during gait in order to improve gait efficiency and reduce incidence of tripping     Baseline  10/25- much improved     Time  2    Period  Weeks    Status  Achieved      PT SHORT TERM GOAL #2   Title  Patient to be able to perform 5-10 minutes of moderate level activity without rest break and fatigue no more than 3/10 in order to show improving functional activity tolerance     Baseline  10/25- continues to fatigue easily, but improving     Time  2    Period  Weeks    Status  On-going      PT SHORT TERM GOAL #3   Title  Patient to be compliant with correct performance of HEP, to be updated PRN     Baseline  10/25- complaint     Time  1    Period  Weeks    Status  Achieved        PT Long Term Goals - 09/05/17 0840      PT LONG TERM GOAL #1   Title  Patient to show improvement of at least 1 MMT grade in all tested muscle groups in order to improve balance and gait pattern     Baseline  10/25- improving     Time  4    Period  Weeks    Status  On-going      PT LONG TERM GOAL #2   Title  Patient to be able to tolerate at least 15-20 minutes of moderate physical activity with no rest breaks and fatigue no more than 3/10 in order to show improved functional activity tolerance and readiness for exercise program    Baseline  10/25- ongoing     Time  4    Period  Weeks    Status  On-going      PT LONG TERM GOAL #3   Title  Patient to score at least 49 on Berg balance test in order to show reduced fall risk     Baseline  10/25- 47    Time  4    Period  Weeks    Status  On-going      PT LONG TERM  GOAL #4   Title  Patient to be educated regarding appropriate machines and exercises to perform at the local gym, as well as safe exercise progression principles in order to encourage independence with regular exercise program     Baseline  10/25- ongoing     Time  4    Period  Weeks    Status  On-going            Plan - 09/19/17 0347    Clinical Impression  Statement  Patient presented today with significant increased pain therefore session initially focused on pain management and supine strengthening exercises. Patient notes that she forgot to purchase heel support as she lost her note that was to remind her to do so.  Patient noted decrease in pain with manual to bilateral quad, hamstring and patellar tendons. She had complaints of stretching with hamstring stretch therefore PT added to HEP. Patient noted relief of pain slightly end of session about 4/10. PT felt instability in left knee with step ups with significant increase in knee mobility medial (valgus motion). Did not increase CKC exercise or balance today secondary to significant increase pain but plan to resume next session.      Rehab Potential  Fair    Clinical Impairments Affecting Rehab Potential  (+) motivated to participate in PT and start independent exercise program; (-) fair outcomes with PT in past, extensive surgical history, chronicity of pain, sedentary lifestyle     PT Frequency  2x / week    PT Duration  4 weeks    PT Treatment/Interventions  ADLs/Self Care Home Management;Biofeedback;DME Instruction;Gait training;Stair training;Functional mobility training;Therapeutic activities;Therapeutic exercise;Balance training;Neuromuscular re-education;Patient/family education;Orthotic Fit/Training;Manual techniques;Energy conservation    PT Next Visit Plan  Next session focus on balance, gait pattern, functional hip strength. F/U with purchase of heel support for Rt LE as there is a .75in/2cm LLD Lt>Rt. Continue CKC exercise; address knee instability with step ups, potentially needs support     PT Home Exercise Plan  Eval: tandem stance with light touch, SLS with light touch, bridges, sidelying hip ABD; 09-20-2023- TA sets, supine clams, dead bugs        Patient will benefit from skilled therapeutic intervention in order to improve the following deficits and impairments:  Abnormal gait, Pain,  Decreased coordination, Decreased mobility, Postural dysfunction, Decreased activity tolerance, Decreased strength, Decreased balance, Difficulty walking, Improper body mechanics  Visit Diagnosis: Chronic pain of right knee  Chronic pain of left knee  Unsteadiness on feet  Other abnormalities of gait and mobility  Muscle weakness (generalized)     Problem List Patient Active Problem List   Diagnosis Date Noted  . Midsternal chest pain 08/19/2015  . Bipolar disorder ([place])   . Hypothyroidism 08/04/2015    Class: Diagnosis of  . Chronic cough 11/23/2014  . Fecal incontinence 11/23/2014  . Constipation 09/22/2013  . Hx of adenomatous colonic polyps 09/22/2013  . S/P total knee arthroplasty 04/27/2013  . Knee pain 04/27/2013  . Muscle weakness (generalized) 04/27/2013  . Chronic fatigue 03/05/2013  . Chronic pain syndrome 03/05/2013  . Osteoarthritis of both knees 03/05/2013  . DJD (degenerative joint disease) of pelvis 03/05/2013  . Vitamin D deficiency 03/05/2013  . Surgery, elective 03/05/2013  . Insomnia 03/05/2013  . Hot flashes 03/05/2013  . History of gastroesophageal reflux (GERD) 03/05/2013  . History of back surgery 03/05/2013  . H/O chest pain 03/05/2013  . Aftercare following joint replacement 08/28/2012  . History of total shoulder replacement 08/28/2012  .  Dislocation of hip joint prosthesis ([place]) 02/04/2012  . HTN (hypertension) 10/15/2011  . Degenerative joint disease of pelvic region 09/10/2011  . H/O bilateral hip replacements 07/26/2011  . Right hip pain 07/26/2011  . Difficulty in walking(719.7) 07/26/2011  . Hyperlipidemia 05/11/2009  . BIPOLAR AFFECTIVE DISORDER 05/11/2009  . Coronary artery disease 05/11/2009  . GERD 05/11/2009  . Fibromyalgia 05/11/2009  . DIZZINESS 05/11/2009  . Chest pain 05/11/2009   Starr Lake PT, DPT 9:01 AM, 09/19/17 [place] [address]  [place], [place], [address] Phone: [phone]   Fax:  [phone]  Name: [name] [name] MRN: [id number] Date of Birth: 08-Jun-1941

## 2017-09-19 NOTE — Patient Instructions (Signed)
Hamstring Stretch    With other leg bent, foot flat, grasp right leg and slowly try to straighten knee. Hold 15 seconds. Repeat 3 times. Do 1-3 sessions per day.  http://gt2.exer.us/280   Copyright  VHI. All rights reserved.

## 2017-09-23 ENCOUNTER — Encounter (HOSPITAL_COMMUNITY): Payer: Self-pay

## 2017-09-23 ENCOUNTER — Ambulatory Visit (HOSPITAL_COMMUNITY): Payer: Medicare Other

## 2017-09-23 DIAGNOSIS — M25561 Pain in right knee: Principal | ICD-10-CM

## 2017-09-23 DIAGNOSIS — M25562 Pain in left knee: Secondary | ICD-10-CM

## 2017-09-23 DIAGNOSIS — G8929 Other chronic pain: Secondary | ICD-10-CM

## 2017-09-23 DIAGNOSIS — R2681 Unsteadiness on feet: Secondary | ICD-10-CM

## 2017-09-23 DIAGNOSIS — R2689 Other abnormalities of gait and mobility: Secondary | ICD-10-CM

## 2017-09-23 DIAGNOSIS — M6281 Muscle weakness (generalized): Secondary | ICD-10-CM

## 2017-09-23 NOTE — Therapy (Signed)
[place], [place], [address] Phone: [phone]   Fax:  [phone]  Physical Therapy Treatment  Patient Details  Name: [name] [name] MRN: [id number] Date of Birth: 1941/05/16 Referring Provider: Sinda Du   Encounter Date: 09/23/2017  PT End of Session - 09/23/17 1034    Visit Number  10    Number of Visits  14    Date for PT Re-Evaluation  10/03/17    Authorization Type  [employer] Medicare  (G-codes done 6th session)    Authorization Time Period  40/8/14 to 48/1/85; recert 63/14-97/02    Authorization - Visit Number  10    Authorization - Number of Visits  16    PT Start Time  0949    PT Stop Time  1028    PT Time Calculation (min)  39 min    Activity Tolerance  Patient tolerated treatment well;Patient limited by fatigue    Behavior During Therapy  [facility] for tasks assessed/performed       Past Medical History:  Diagnosis Date  . Arthritis   . Bipolar affective disorder ([place])   . Cataract   . Chronic back pain   . Chronic fatigue   . Dizziness   . Dyslipidemia   . Fibromyalgia   . GERD (gastroesophageal reflux disease)   . H/O: hysterectomy   . Hx of adenomatous colonic polyps 2005  . Hyperlipidemia   . Hypertension   . Knee pain   . Non-obstructive CAD    a. 08/2015 Cath: LM 40, LCX small, 30ost, 7m, OM2 small, RCA nl/small, EF 55-65%.  . Osteopenia   . Sciatica     Past Surgical History:  Procedure Laterality Date  . ABDOMINAL HYSTERECTOMY  1994   TAH- DUB, BSO  . [place] bunionectomy     bilateral  . BREAST BIOPSY Left 1989  . BREAST LUMPECTOMY    . BUNIONECTOMY Left 2005  . CARDIAC CATHETERIZATION  2010   +CAD  . COLONOSCOPY  01/29/2007   OVZ:CHYIFO rectum, left-sided diverticula, 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  Remainder of colonic mucosa was normal  . CYSTECTOMY     pilonidial cyst  . ESOPHAGOGASTRODUODENOSCOPY     followed by colonoscopy with snare  polypectomy  . right knee replacement  02/2013  . SHOULDER ARTHROSCOPY Left 2005  . SHOULDER ARTHROSCOPY Right   . SPINAL FUSION    . SPINAL FUSION  2004   L4-L5  . TOTAL HIP ARTHROPLASTY Right 06/2006  . TOTAL HIP ARTHROPLASTY  8/12  . TOTAL SHOULDER REPLACEMENT Left 08/2005    There were no vitals filed for this visit.  Subjective Assessment - 09/23/17 1029    Subjective  Patient feeling well overall today. SHe had a big weekend with her grandkids and family visiting for a party. She did not have an increase in pain this weekend. Patient states she has not worked on her hamstring stretch and would like another picture to add it to her HEP. She plans to go grocery shopping later today.    Pertinent History  HTN, B hip replacements, fibromyalgia, bipolar, hx total shoulder replacement, hx back pain/surgeries, sciatica, B bunionectomies     Patient Stated Goals  get balance better, work on strength, get HEP/independent exercise program     Currently in Pain?  Yes    Pain Score  5     Pain Location  Knee    Pain Orientation  Right;Anterior;Posterior;Left  Pain Descriptors / Indicators  Aching;Sore    Pain Type  Chronic pain    Pain Onset  More than a month ago    Pain Frequency  Constant    Aggravating Factors   standing for long periods of time    Pain Relieving Factors  ice    Effect of Pain on Daily Activities  none        [facility] Adult PT Treatment/Exercise - 09/23/17 0001      Knee/Hip Exercises: Stretches   Active Hamstring Stretch  2 reps;30 seconds;Both wthi rope for overpressure at toes (garstroc/hamstring)      Knee/Hip Exercises: Standing   Lateral Step Up  Both;1 set;Step Height: 4";10 reps    Forward Step Up  Both;Hand Hold: 2;10 reps;2 sets      Knee/Hip Exercises: Supine   Bridges  Strengthening;Both;2 sets;10 reps    Bridges Limitations  red theraband for abduction      Knee/Hip Exercises: Sidelying   Hip ABduction  10 reps;Both;Strengthening    Hip  ABduction Limitations  cueing for form, rotate hips forward      Manual Therapy   Manual Therapy  Soft tissue mobilization    Manual therapy comments  completed separate rest of treatment    Soft tissue mobilization  Bilateral knee anteior and posterior (on right) mm for pain management; stretching bilateral hamstrings 3 minutes each        PT Education - 09/23/17 1033    Education provided  Yes    Education Details  added band to bridges and went over HEP hamstring stretch with patient    Person(s) Educated  Patient    Methods  Handout;Demonstration;Explanation    Comprehension  Verbalized understanding       PT Short Term Goals - 09/05/17 0839      PT SHORT TERM GOAL #1   Title  Patient to be able to consistently ambulate with heel-toe pattern and improved general foot clearance and knee extension during gait in order to improve gait efficiency and reduce incidence of tripping     Baseline  10/25- much improved     Time  2    Period  Weeks    Status  Achieved      PT SHORT TERM GOAL #2   Title  Patient to be able to perform 5-10 minutes of moderate level activity without rest break and fatigue no more than 3/10 in order to show improving functional activity tolerance     Baseline  10/25- continues to fatigue easily, but improving     Time  2    Period  Weeks    Status  On-going      PT SHORT TERM GOAL #3   Title  Patient to be compliant with correct performance of HEP, to be updated PRN     Baseline  10/25- complaint     Time  1    Period  Weeks    Status  Achieved        PT Long Term Goals - 09/05/17 0840      PT LONG TERM GOAL #1   Title  Patient to show improvement of at least 1 MMT grade in all tested muscle groups in order to improve balance and gait pattern     Baseline  10/25- improving     Time  4    Period  Weeks    Status  On-going      PT LONG TERM GOAL #2  Title  Patient to be able to tolerate at least 15-20 minutes of moderate physical activity  with no rest breaks and fatigue no more than 3/10 in order to show improved functional activity tolerance and readiness for exercise program    Baseline  10/25- ongoing     Time  4    Period  Weeks    Status  On-going      PT LONG TERM GOAL #3   Title  Patient to score at least 49 on Berg balance test in order to show reduced fall risk     Baseline  10/25- 47    Time  4    Period  Weeks    Status  On-going      PT LONG TERM GOAL #4   Title  Patient to be educated regarding appropriate machines and exercises to perform at the local gym, as well as safe exercise progression principles in order to encourage independence with regular exercise program     Baseline  10/25- ongoing     Time  4    Period  Weeks    Status  On-going        Plan - 09/23/17 1035    Clinical Impression Statement  Patient is prgressing well in therapy and did not have any significant increase in pain since last session. She has purchased her heel lift and is trialing it today for the first time. Thearpy today focused on educationg for form with exercises and trengthening primarily targeted the knee extenspors, hip abductors, and hip extensors today. Patient has notable knee valgus with step ups BLE and she will benefit from continued CKC strengthening to address impairments.     Rehab Potential  Fair    Clinical Impairments Affecting Rehab Potential  (+) motivated to participate in PT and start independent exercise program; (-) fair outcomes with PT in past, extensive surgical history, chronicity of pain, sedentary lifestyle     PT Frequency  2x / week    PT Duration  4 weeks    PT Treatment/Interventions  ADLs/Self Care Home Management;Biofeedback;DME Instruction;Gait training;Stair training;Functional mobility training;Therapeutic activities;Therapeutic exercise;Balance training;Neuromuscular re-education;Patient/family education;Orthotic Fit/Training;Manual techniques;Energy conservation    PT Next Visit Plan   Next session focus on balance, gait pattern, functional hip strength, and quad strength to control BLE knee valgus. Continue CKC exercise; address knee instability with step ups, potentially needs support Reviwe HEP.    PT Home Exercise Plan  Eval: tandem stance with light touch, SLS with light touch, bridges, sidelying hip ABD; 2023-09-11- TA sets, supine clams, dead bugs, Added hamstring/gastroc stretch, and red theraband added to bridges.    Consulted and Agree with Plan of Care  Patient       Patient will benefit from skilled therapeutic intervention in order to improve the following deficits and impairments:  Abnormal gait, Pain, Decreased coordination, Decreased mobility, Postural dysfunction, Decreased activity tolerance, Decreased strength, Decreased balance, Difficulty walking, Improper body mechanics  Visit Diagnosis: Chronic pain of right knee  Chronic pain of left knee  Unsteadiness on feet  Other abnormalities of gait and mobility  Muscle weakness (generalized)   Problem List Patient Active Problem List   Diagnosis Date Noted  . Midsternal chest pain 08/19/2015  . Bipolar disorder ([place])   . Hypothyroidism 08/04/2015    Class: Diagnosis of  . Chronic cough 11/23/2014  . Fecal incontinence 11/23/2014  . Constipation 09/22/2013  . Hx of adenomatous colonic polyps 09/22/2013  . S/P total knee arthroplasty 04/27/2013  .  Knee pain 04/27/2013  . Muscle weakness (generalized) 04/27/2013  . Chronic fatigue 03/05/2013  . Chronic pain syndrome 03/05/2013  . Osteoarthritis of both knees 03/05/2013  . DJD (degenerative joint disease) of pelvis 03/05/2013  . Vitamin D deficiency 03/05/2013  . Surgery, elective 03/05/2013  . Insomnia 03/05/2013  . Hot flashes 03/05/2013  . History of gastroesophageal reflux (GERD) 03/05/2013  . History of back surgery 03/05/2013  . H/O chest pain 03/05/2013  . Aftercare following joint replacement 08/28/2012  . History of total shoulder  replacement 08/28/2012  . Dislocation of hip joint prosthesis ([place]) 02/04/2012  . HTN (hypertension) 10/15/2011  . Degenerative joint disease of pelvic region 09/10/2011  . H/O bilateral hip replacements 07/26/2011  . Right hip pain 07/26/2011  . Difficulty in walking(719.7) 07/26/2011  . Hyperlipidemia 05/11/2009  . BIPOLAR AFFECTIVE DISORDER 05/11/2009  . Coronary artery disease 05/11/2009  . GERD 05/11/2009  . Fibromyalgia 05/11/2009  . DIZZINESS 05/11/2009  . Chest pain 05/11/2009    Debara Pickett, PT, DPT Physical Therapist with [facility]  09/23/2017 11:15 AM    [place], [place], [address] Phone: [phone]   Fax:  [phone]  Name: [name] [name] MRN: [id number] Date of Birth: 1941-02-26

## 2017-09-23 NOTE — Patient Instructions (Addendum)
   HAMSTRING STRETCH WITH TOWEL:  2-3 times, hold for 30-60 seconds  While lying down on your back, hook a towel or strap under  your foot and draw up your leg until a stretch is felt under your leg and calf area.  Keep your knee in a straightened position during the stretch.    BRIDGING ELASTIC BAND ABDUCTION: 2-3 times 10 repetitions  While lying on your back, place an elastic band around your knees and pull your knees apart. Hold this and then tighten your lower abdominals, squeeze your buttocks and raise your buttocks off the floor/bed as creating a "Bridge" with your body.

## 2017-09-25 ENCOUNTER — Encounter (HOSPITAL_COMMUNITY): Payer: Self-pay | Admitting: Physical Therapy

## 2017-09-25 ENCOUNTER — Ambulatory Visit (HOSPITAL_COMMUNITY): Payer: Medicare Other | Admitting: Physical Therapy

## 2017-09-25 DIAGNOSIS — R2689 Other abnormalities of gait and mobility: Secondary | ICD-10-CM

## 2017-09-25 DIAGNOSIS — G8929 Other chronic pain: Secondary | ICD-10-CM

## 2017-09-25 DIAGNOSIS — M25561 Pain in right knee: Principal | ICD-10-CM

## 2017-09-25 DIAGNOSIS — M25562 Pain in left knee: Secondary | ICD-10-CM

## 2017-09-25 DIAGNOSIS — M6281 Muscle weakness (generalized): Secondary | ICD-10-CM

## 2017-09-25 DIAGNOSIS — R2681 Unsteadiness on feet: Secondary | ICD-10-CM

## 2017-09-25 NOTE — Therapy (Addendum)
[place], [place], [address] Phone: [phone]   Fax:  [phone]  Physical Therapy Treatment/Discharge Summary  Patient Details  Name: [name] [name] MRN: [id number] Date of Birth: 09/24/1941 Referring Provider: Sinda Du  PHYSICAL THERAPY DISCHARGE SUMMARY  Visits from Start of Care: 11  Current functional level related to goals / functional outcomes: See below for details. Patient wishes to discontinue therapy and feels she can continue on her own.   Remaining deficits: See below for details.    Education / Equipment: See below for last education provided.   Plan: Patient agrees to discharge.  Patient goals were partially met. Patient is being discharged due to the patient's request.  ?????   Kipp Brood, PT, DPT Physical Therapist with [facility]  11/26/2017 7:53 AM        Encounter Date: 09/25/2017  PT End of Session - 09/25/17 0902    Visit Number  11    Number of Visits  14    Date for PT Re-Evaluation  10/03/17    Authorization Type  [employer] Medicare  (G-codes done 6th session)    Authorization Time Period  08/14/14 to 94/5/85; recert 92/92-44/62    Authorization - Visit Number  11    Authorization - Number of Visits  16    PT Start Time  0819    PT Stop Time  0859    PT Time Calculation (min)  40 min    Activity Tolerance  Patient tolerated treatment well;Other (comment) patient limited by fatigue and feelign warm,    Behavior During Therapy  [facility] for tasks assessed/performed       Past Medical History:  Diagnosis Date  . Arthritis   . Bipolar affective disorder ([place])   . Cataract   . Chronic back pain   . Chronic fatigue   . Dizziness   . Dyslipidemia   . Fibromyalgia   . GERD (gastroesophageal reflux disease)   . H/O: hysterectomy   . Hx of adenomatous colonic polyps 2005  . Hyperlipidemia   . Hypertension   . Knee pain   .  Non-obstructive CAD    a. 08/2015 Cath: LM 40, LCX small, 30ost, 66m OM2 small, RCA nl/small, EF 55-65%.  . Osteopenia   . Sciatica     Past Surgical History:  Procedure Laterality Date  . ABDOMINAL HYSTERECTOMY  1994   TAH- DUB, BSO  . [place] bunionectomy     bilateral  . BREAST BIOPSY Left 1989  . BREAST LUMPECTOMY    . BUNIONECTOMY Left 2005  . CARDIAC CATHETERIZATION  2010   +CAD  . COLONOSCOPY  01/29/2007   RMMN:OTRRNHrectum, left-sided diverticula, 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  Remainder of colonic mucosa was normal  . CYSTECTOMY     pilonidial cyst  . ESOPHAGOGASTRODUODENOSCOPY     followed by colonoscopy with snare polypectomy  . right knee replacement  02/2013  . SHOULDER ARTHROSCOPY Left 2005  . SHOULDER ARTHROSCOPY Right   . SPINAL FUSION    . SPINAL FUSION  2004   L4-L5  . TOTAL HIP ARTHROPLASTY Right 06/2006  . TOTAL HIP ARTHROPLASTY  8/12  . TOTAL SHOULDER REPLACEMENT Left 08/2005    There were no vitals filed for this visit.  Subjective Assessment - 09/25/17 0825    Subjective  Patient feeling well overall today. She states she is sore today and pain is aropund 6/10 on right. She states  the bridges with theraband are challenging but she has been able to do them as well as her hamstring stretch and said the pictures were very helpful. She doesn't have big plans today but has is going to the grocery shopping later today.    Pertinent History  HTN, B hip replacements, fibromyalgia, bipolar, hx total shoulder replacement, hx back pain/surgeries, sciatica, B bunionectomies     Patient Stated Goals  get balance better, work on strength, get HEP/independent exercise program     Currently in Pain?  Yes    Pain Score  6     Pain Location  Knee    Pain Orientation  Right;Anterior    Pain Descriptors / Indicators  Aching;Sore;Dull    Pain Type  Chronic pain    Pain Onset  More than a month ago    Pain Frequency  Constant    Aggravating  Factors   walking and standing long periods of time    Pain Relieving Factors  ice    Effect of Pain on Daily Activities  none         [facility] Adult PT Treatment/Exercise - 09/25/17 0001      Knee/Hip Exercises: Aerobic   Stationary Bike  5 minutres for aerobic warm up and ROM on seat 6       Knee/Hip Exercises: Machines for Strengthening   Total Gym Leg Press  2x 10 reps at 34 degrees with manual faciltation of L knee alignment      Knee/Hip Exercises: Standing   Lateral Step Up  Both;1 set;15 reps;Step Height: 4";Hand Hold: 2    Forward Step Up  Both;1 set;15 reps;Step Height: 4";Hand Hold: 2    Wall Squat  1 set;Limitations;5 reps    Wall Squat Limitations  patient with substantial L knee instabiliy - transitioned to unweighted squat      Knee/Hip Exercises: Supine   Bridges  Strengthening;Both;2 sets;20 reps    Bridges Limitations  red theraband for abduction        Balance Exercises - 09/25/17 0857      Balance Exercises: Standing   Sidestepping  5 reps side step over 3 hurdles both directions        PT Education - 09/25/17 0902    Education provided  Yes    Education Details  review new exercises for HEP, educated on safe form with exercises    Person(s) Educated  Patient    Methods  Explanation    Comprehension  Verbalized understanding       PT Short Term Goals - 09/05/17 0839      PT SHORT TERM GOAL #1   Title  Patient to be able to consistently ambulate with heel-toe pattern and improved general foot clearance and knee extension during gait in order to improve gait efficiency and reduce incidence of tripping     Baseline  10/25- much improved     Time  2    Period  Weeks    Status  Achieved      PT SHORT TERM GOAL #2   Title  Patient to be able to perform 5-10 minutes of moderate level activity without rest break and fatigue no more than 3/10 in order to show improving functional activity tolerance     Baseline  10/25- continues to fatigue easily, but  improving     Time  2    Period  Weeks    Status  On-going      PT SHORT TERM GOAL #  3   Title  Patient to be compliant with correct performance of HEP, to be updated PRN     Baseline  10/25- complaint     Time  1    Period  Weeks    Status  Achieved        PT Long Term Goals - 09/05/17 0840      PT LONG TERM GOAL #1   Title  Patient to show improvement of at least 1 MMT grade in all tested muscle groups in order to improve balance and gait pattern     Baseline  10/25- improving     Time  4    Period  Weeks    Status  On-going      PT LONG TERM GOAL #2   Title  Patient to be able to tolerate at least 15-20 minutes of moderate physical activity with no rest breaks and fatigue no more than 3/10 in order to show improved functional activity tolerance and readiness for exercise program    Baseline  10/25- ongoing     Time  4    Period  Weeks    Status  On-going      PT LONG TERM GOAL #3   Title  Patient to score at least 49 on Berg balance test in order to show reduced fall risk     Baseline  10/25- 47    Time  4    Period  Weeks    Status  On-going      PT LONG TERM GOAL #4   Title  Patient to be educated regarding appropriate machines and exercises to perform at the local gym, as well as safe exercise progression principles in order to encourage independence with regular exercise program     Baseline  10/25- ongoing     Time  4    Period  Weeks    Status  On-going          Plan - 09/25/17 0903    Clinical Impression Statement  Patient is making steady progress in therapy. She arrived today without her [facility] and was ambulating with notable unsteadiness. PT recommended she continue using SPC while ambulating in teh community. She was able to progress functional strengthening during therapy with unweighted squats and SLS balance activities.  She continues to be limited by fatigue throughout therapy session and left knee instability. At end of session today she stated her  pain had decreased from 6/10 top 5/10 and reported her right knee felt less stiff. [name] [name] will continue to benefit from skilled PT to address strength, balance, and activity tolerance deficits to improve functional mobility.     Rehab Potential  Fair    Clinical Impairments Affecting Rehab Potential  (+) motivated to participate in PT and start independent exercise program; (-) fair outcomes with PT in past, extensive surgical history, chronicity of pain, sedentary lifestyle     PT Frequency  2x / week    PT Duration  4 weeks    PT Treatment/Interventions  ADLs/Self Care Home Management;Biofeedback;DME Instruction;Gait training;Stair training;Functional mobility training;Therapeutic activities;Therapeutic exercise;Balance training;Neuromuscular re-education;Patient/family education;Orthotic Fit/Training;Manual techniques;Energy conservation    PT Next Visit Plan  Next session discus beenfits of bracing for L knee and make recommendations for bracing. Focus on balance, gait pattern, functional hip strength, and quad strength to control BLE knee valgus. Continue CKC exercise; address knee instability with step ups, potentially needs support Review HEP.    PT Home Exercise Plan  Eval:  tandem stance with light touch, SLS with light touch, bridges, sidelying hip ABD; September 11, 2023- TA sets, supine clams, dead bugs, Added hamstring/gastroc stretch, and red theraband added to bridges.    Consulted and Agree with Plan of Care  Patient       Patient will benefit from skilled therapeutic intervention in order to improve the following deficits and impairments:  Abnormal gait, Pain, Decreased coordination, Decreased mobility, Postural dysfunction, Decreased activity tolerance, Decreased strength, Decreased balance, Difficulty walking, Improper body mechanics  Visit Diagnosis: Chronic pain of right knee  Chronic pain of left knee  Unsteadiness on feet  Other abnormalities of gait and mobility  Muscle weakness  (generalized)     Problem List Patient Active Problem List   Diagnosis Date Noted  . Midsternal chest pain 08/19/2015  . Bipolar disorder ([place])   . Hypothyroidism 08/04/2015    Class: Diagnosis of  . Chronic cough 11/23/2014  . Fecal incontinence 11/23/2014  . Constipation 09/22/2013  . Hx of adenomatous colonic polyps 09/22/2013  . S/P total knee arthroplasty 04/27/2013  . Knee pain 04/27/2013  . Muscle weakness (generalized) 04/27/2013  . Chronic fatigue 03/05/2013  . Chronic pain syndrome 03/05/2013  . Osteoarthritis of both knees 03/05/2013  . DJD (degenerative joint disease) of pelvis 03/05/2013  . Vitamin D deficiency 03/05/2013  . Surgery, elective 03/05/2013  . Insomnia 03/05/2013  . Hot flashes 03/05/2013  . History of gastroesophageal reflux (GERD) 03/05/2013  . History of back surgery 03/05/2013  . H/O chest pain 03/05/2013  . Aftercare following joint replacement 08/28/2012  . History of total shoulder replacement 08/28/2012  . Dislocation of hip joint prosthesis ([place]) 02/04/2012  . HTN (hypertension) 10/15/2011  . Degenerative joint disease of pelvic region 09/10/2011  . H/O bilateral hip replacements 07/26/2011  . Right hip pain 07/26/2011  . Difficulty in walking(719.7) 07/26/2011  . Hyperlipidemia 05/11/2009  . BIPOLAR AFFECTIVE DISORDER 05/11/2009  . Coronary artery disease 05/11/2009  . GERD 05/11/2009  . Fibromyalgia 05/11/2009  . DIZZINESS 05/11/2009  . Chest pain 05/11/2009    Debara Pickett, PT, DPT Physical Therapist with [facility]  09/25/2017 9:19 AM    [place], [place], [address] Phone: [phone]   Fax:  [phone]  Name: [name] [name] MRN: [id number] Date of Birth: [birth year]

## 2017-09-30 ENCOUNTER — Telehealth (HOSPITAL_COMMUNITY): Payer: Self-pay | Admitting: Pulmonary Disease

## 2017-09-30 ENCOUNTER — Ambulatory Visit (HOSPITAL_COMMUNITY): Payer: Medicare Other | Admitting: Physical Therapy

## 2017-09-30 NOTE — Telephone Encounter (Signed)
09/30/17  pt left a message saying she couldn't come this morning but could come this afternoon if anything was available

## 2017-10-01 ENCOUNTER — Telehealth (HOSPITAL_COMMUNITY): Payer: Self-pay | Admitting: Pulmonary Disease

## 2017-10-01 NOTE — Telephone Encounter (Signed)
10/01/17  pt left a message to cx and said that she has decided to not continue therapy and continue on her own

## 2017-10-02 ENCOUNTER — Ambulatory Visit (HOSPITAL_COMMUNITY): Payer: Medicare Other | Admitting: Physical Therapy

## 2017-10-03 IMAGING — US US ABDOMEN COMPLETE
1 series · 14 of 25 positions shown · non-contrast
Comparison: Abdominal ultrasound August 16, 2003

CLINICAL DATA: Abdominal pain

EXAM:
ULTRASOUND ABDOMEN COMPLETE

[Series 1: us abdomen complete · 0.17mm/px · 14 of 73 slices shown]
[im 1/73]
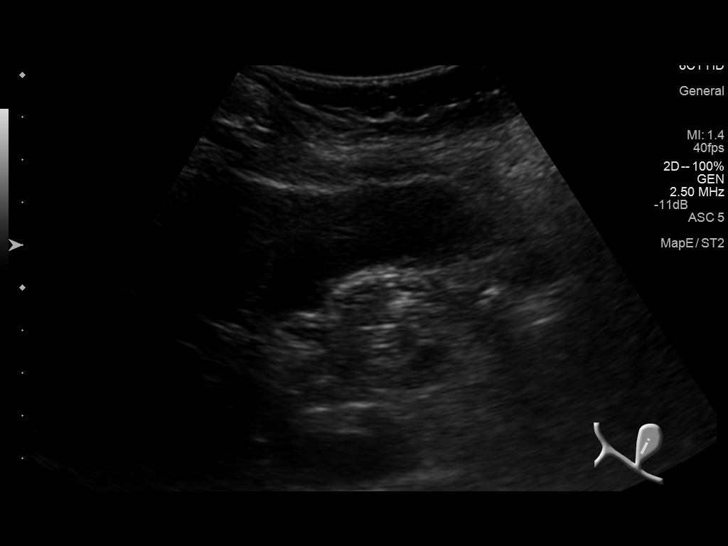
[im 7/73]
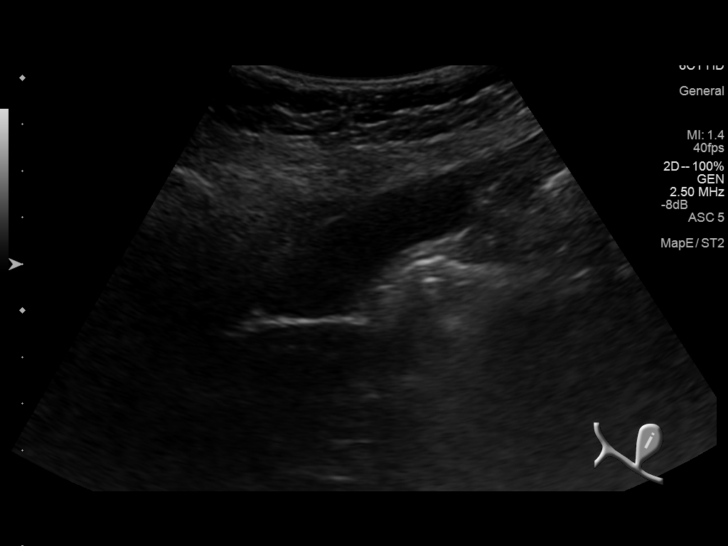
[im 13/73]
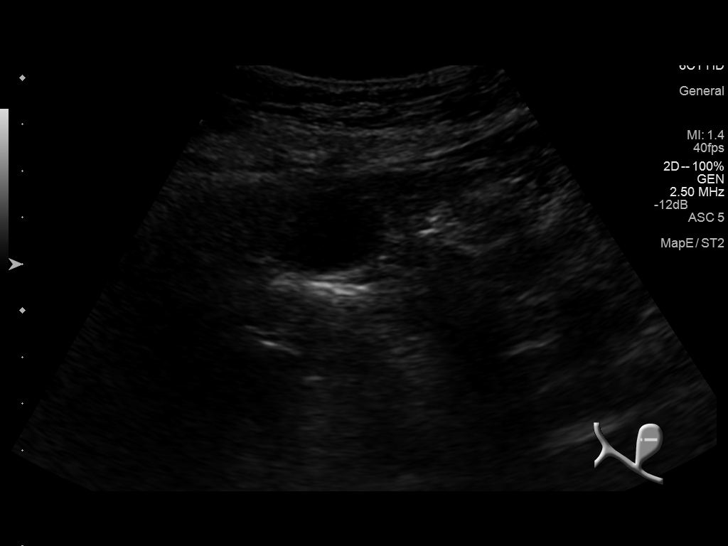
[im 19/73]
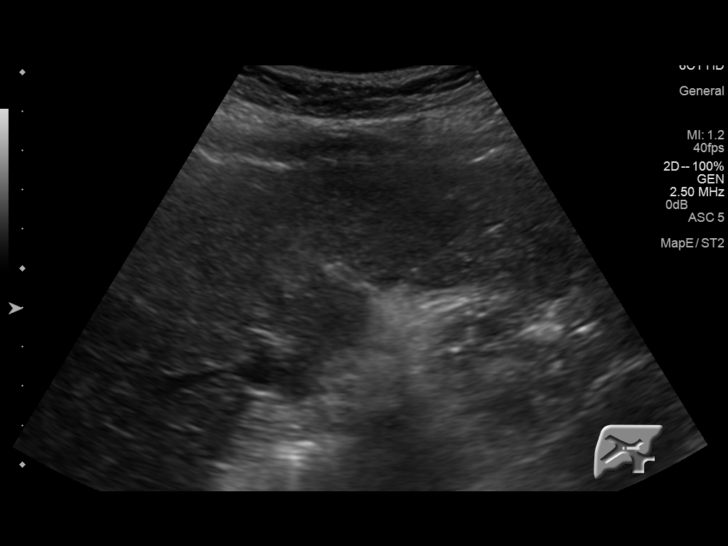
[im 25/73]
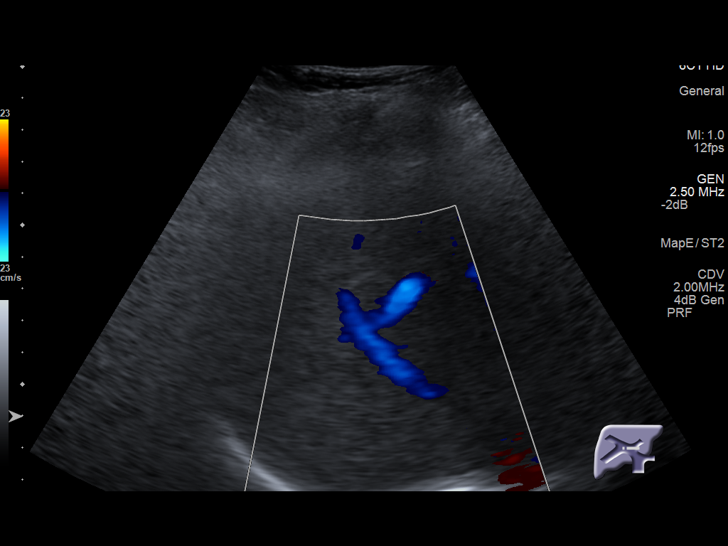
[im 28/73]
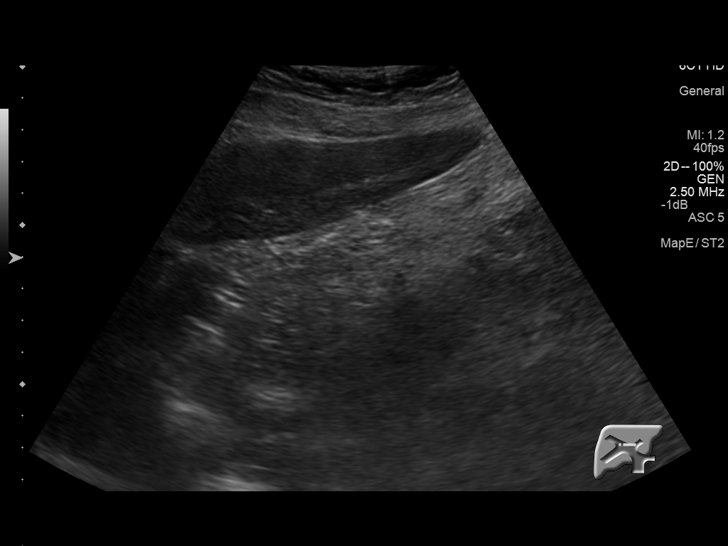
[im 34/73]
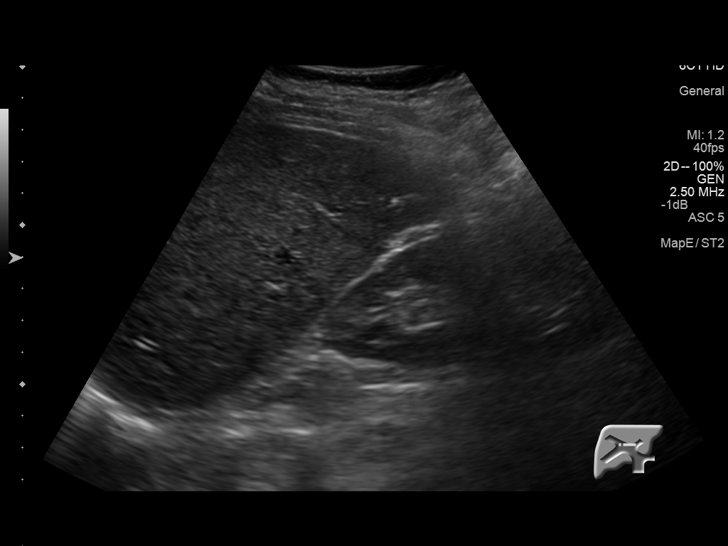
[im 40/73]
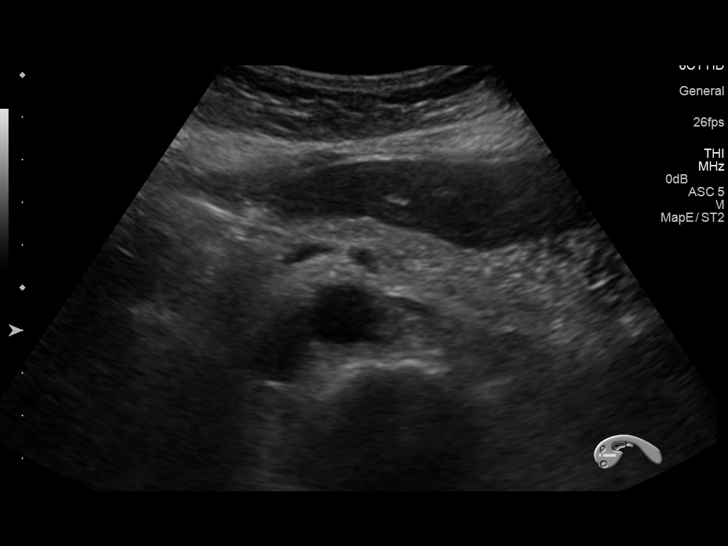
[im 46/73]
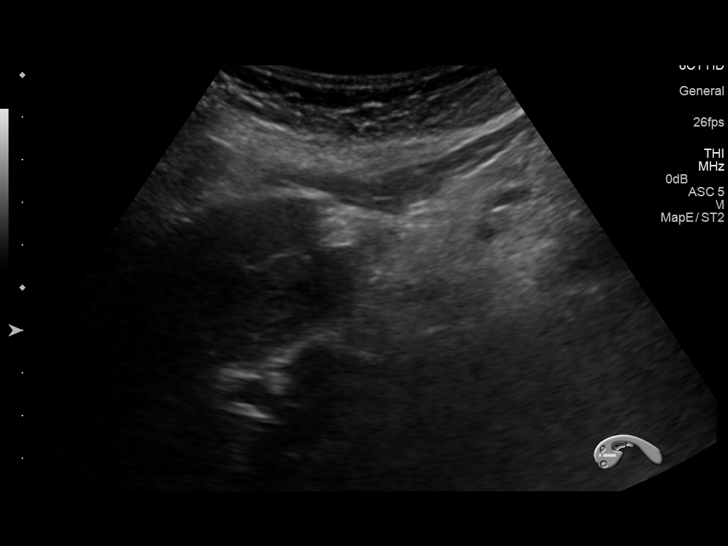
[im 49/73]
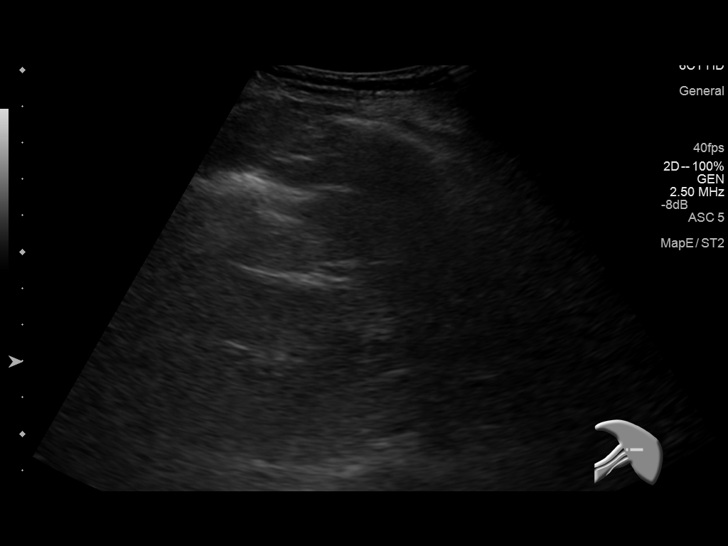
[im 55/73]
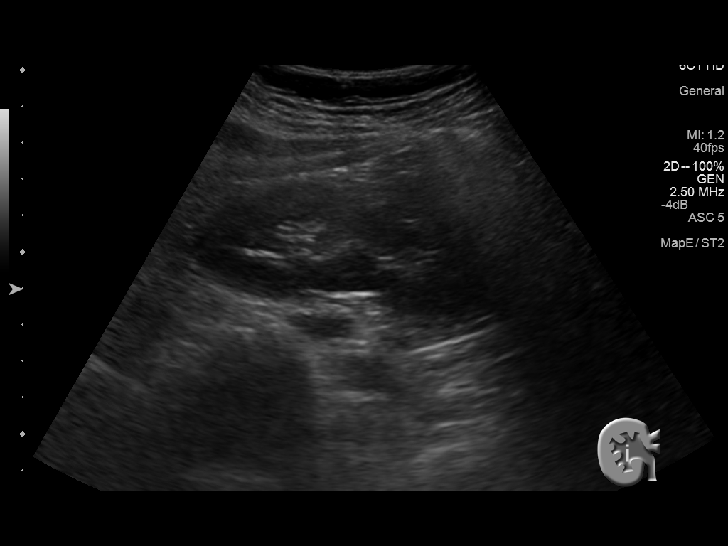
[im 61/73]
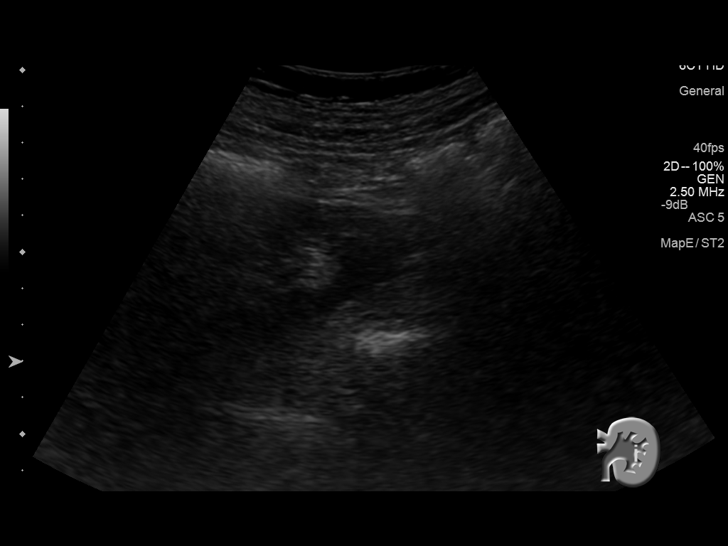
[im 67/73]
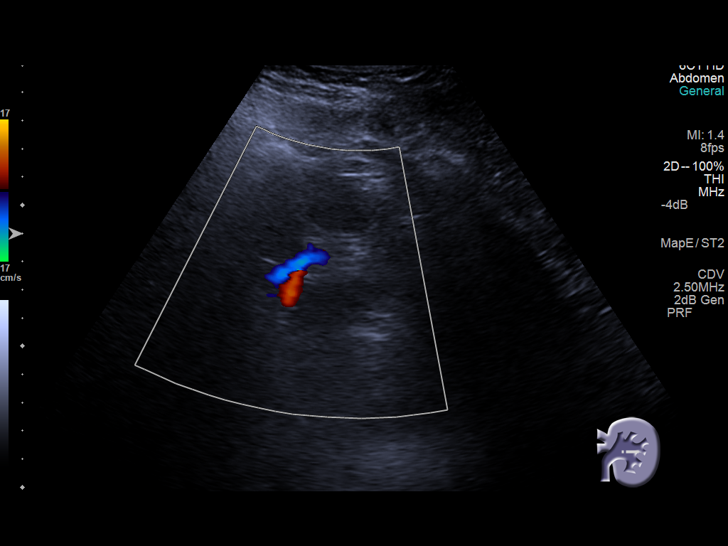
[im 73/73]
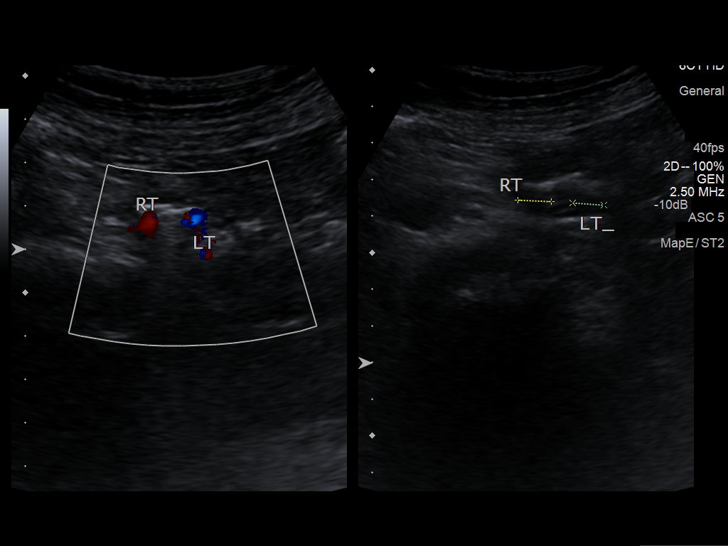

[14 of 25 positions shown; findings below may reference images not displayed]

FINDINGS: Gallbladder: No gallstones or wall thickening visualized. No
sonographic Murphy sign noted.

Common bile duct: Diameter: 3 mm

Liver: No focal lesion identified. Within normal limits in
parenchymal echogenicity.

IVC: No abnormality visualized.

Pancreas: Visualized portion unremarkable.

Spleen: Size and appearance within normal limits.

Right Kidney: Length: 9.7 cm. Echogenicity within normal limits. No
mass or hydronephrosis visualized.

Left Kidney: Length: 10.8 cm. Echogenicity within normal limits. No
mass or hydronephrosis visualized.

Abdominal aorta: No aneurysm visualized.

Other findings: None.
IMPRESSION: No acute abnormality is demonstrated on this abdominal ultrasound.
If gallbladder dysfunction is suspected clinically, a nuclear
medicine hepatobiliary scan may be useful.

## 2018-02-06 ENCOUNTER — Ambulatory Visit (INDEPENDENT_AMBULATORY_CARE_PROVIDER_SITE_OTHER): Payer: Medicare Other | Admitting: Otolaryngology

## 2018-02-06 DIAGNOSIS — H903 Sensorineural hearing loss, bilateral: Secondary | ICD-10-CM

## 2018-02-06 DIAGNOSIS — H6121 Impacted cerumen, right ear: Secondary | ICD-10-CM

## 2018-03-24 ENCOUNTER — Encounter: Payer: Self-pay | Admitting: Internal Medicine

## 2018-04-11 ENCOUNTER — Ambulatory Visit: Payer: Medicare Other | Admitting: Internal Medicine

## 2018-05-01 NOTE — Progress Notes (Signed)
Cardiology Office Note    Date:  05/02/2018   ID:  [name] [name], DOB 12/29/1940, MRN [id number]  PCP:  Sinda Du, MD  Cardiologist:  Dr. Harrington Challenger  Chief Complaint: 6 Months follow up  History of Present Illness:   [name] [name] is a 77 y.o. female with h/o nonobstructive CAD by cath, treated medically, HTN and HLD presents for follow up.   She was doing well on cardiac stand point when last seen by APP 06/2017.  Patient is here for 78-month follow-up.  Patient has chronic fatigue and tiredness due to fibromyalgia.  Few weeks ago she had a "episode "while doing household control.  She felt unusual fatigue with sharp chest pain.  No associated diaphoresis, nausea, vomiting or radiation.  She did have palpitation.  She took sublingual nitroglycerin x3 with complete resolution of her symptoms.  No recurrence.  She walks around the house and goes up and down stairs without icterus present on the symptoms.  Compliant with medication.  She had a recent lab work by PCP, will bring during next office visit. No orthopnea, PND, syncope, LE edema or dizziness.   Past Medical History:  Diagnosis Date  . Arthritis   . Bipolar affective disorder ([place])   . Cataract   . Chronic back pain   . Chronic fatigue   . Dizziness   . Dyslipidemia   . Fibromyalgia   . GERD (gastroesophageal reflux disease)   . H/O: hysterectomy   . Hx of adenomatous colonic polyps 2005  . Hyperlipidemia   . Hypertension   . Knee pain   . Non-obstructive CAD    a. 08/2015 Cath: LM 40, LCX small, 30ost, 95m, OM2 small, RCA nl/small, EF 55-65%.  . Osteopenia   . Sciatica     Past Surgical History:  Procedure Laterality Date  . ABDOMINAL HYSTERECTOMY  1994   TAH- DUB, BSO  . [place] bunionectomy     bilateral  . BREAST BIOPSY Left 1989  . BREAST LUMPECTOMY    . BUNIONECTOMY Left 2005  . CARDIAC CATHETERIZATION  2010   +CAD  . CARDIAC CATHETERIZATION N/A 08/19/2015   Procedure: Left Heart Cath and Coronary  Angiography;  Surgeon: Belva Crome, MD;  Location: [facility];  Service: Cardiovascular;  Laterality: N/A;  . 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  Remainder of colonic mucosa was normal  . COLONOSCOPY WITH ESOPHAGOGASTRODUODENOSCOPY (EGD) N/A 02/01/2014   JJK:KXFG erosive reflux/gastric polyp/normal rectum/scattered left sided diverticula, normal distal TI. gastric bx negative, fundic gland polyp. next TCS 01/2019.  Marland Kitchen CYSTECTOMY     pilonidial cyst  . ESOPHAGOGASTRODUODENOSCOPY     followed by colonoscopy with snare polypectomy  . right knee replacement  02/2013  . SHOULDER ARTHROSCOPY Left 2005  . SHOULDER ARTHROSCOPY Right   . SPINAL FUSION    . SPINAL FUSION  2004   L4-L5  . TOTAL HIP ARTHROPLASTY Right 06/2006  . TOTAL HIP ARTHROPLASTY  8/12  . TOTAL SHOULDER REPLACEMENT Left 08/2005    Current Medications: Prior to Admission medications   Medication Sig Start Date End Date Taking? Authorizing Provider  ALPRAZolam Duanne Moron) 0.5 MG tablet Take 1 tablet by mouth daily as needed. anxiety 12/15/13   [provider]  aspirin 81 MG tablet Take 81 mg by mouth daily.    [provider]  atorvastatin (LIPITOR) 40 MG tablet TAKE (1) TABLET BY MOUTH AT BEDTIME. 06/19/17  [name] Fire, Brittainy M, PA-C  CALCIUM PO Take by mouth daily.    [provider]  Cholecalciferol (VITAMIN D PO) Take 2,000 Int'l Units by mouth daily.    [provider]  DULoxetine (CYMBALTA) 60 MG capsule Take 120 mg by mouth daily.     [provider]  estradiol (ESTRACE) 1 MG tablet Take 0.5 tablets (0.5 mg total) by mouth daily. Take 1/2 tablet po daily 07/27/13   Regina Eck, CNM  lamoTRIgine (LAMICTAL) 150 MG tablet Take 150 mg by mouth at bedtime.    [provider]  levothyroxine (LEVOTHROID) 25 MCG tablet Take 1 tablet (25 mcg total) by mouth daily. 08/05/15   Regina Eck, CNM  losartan (COZAAR) 50 MG tablet Take 1 tablet (50 mg total) by mouth daily. 06/19/17   Lyda Jester M, PA-C  meloxicam (MOBIC) 7.5 MG tablet Take 7.5 mg by mouth 2 (two) times daily.     [provider]  Multiple Vitamin (MULTIVITAMIN) tablet Take 1 tablet by mouth daily.      [provider]  niacin (SLO-NIACIN) 500 MG tablet Take 500 mg by mouth daily.     [provider]  nitroGLYCERIN (NITROSTAT) 0.4 MG SL tablet PLACE 1 TAB UNDER TONGUE EVERY 5 MIN IF NEEDED FOR CHEST PAIN. MAY USE 3 TIMES.NO RELIEF CALL 911. 06/19/17   Lyda Jester M, PA-C  Omega-3 Fatty Acids (FISH OIL) 1000 MG CAPS Take 1 capsule by mouth daily.     [provider]  Probiotic Product (PROBIOTIC DAILY PO) Take 1 tablet by mouth every morning. Florify    [provider]  RESTASIS 0.05 % ophthalmic emulsion Place 1 drop into both eyes 2 (two) times daily.  06/24/13   [provider]  risperiDONE (RISPERDAL) 1 MG tablet Take 0.5 mg by mouth at bedtime.     [provider]  WELLBUTRIN XL 300 MG 24 hr tablet Take 300 mg by mouth daily.  07/08/14   [provider]  zolpidem (AMBIEN) 10 MG tablet Take 10 mg by mouth at bedtime as needed for sleep.  06/09/13   [provider]    Allergies:   Hydrocodone-acetaminophen and Vicodin [hydrocodone-acetaminophen]   Social History   Socioeconomic History  . Marital status: Widowed    Spouse name: Not on file  . Number of children: Not on file  . Years of education: Not on file  . Highest education level: Not on file  Occupational History  . Occupation: unemployed    Fish farm manager: RETIRED    Comment: retired Education officer, [employer]  Social Needs  . Financial resource strain: Not on file  . Food insecurity:    Worry: Not on file    Inability: Not on file  . Transportation needs:    Medical: Not on file    Non-medical: Not on file  Tobacco Use  . Smoking status: Never Smoker  . Smokeless  tobacco: Never Used  Substance and Sexual Activity  . Alcohol use: Yes    Comment: less than 6  . Drug use: No  . Sexual activity: Never    Partners: Male    Birth control/protection: Surgical    Comment: TAH  Lifestyle  . Physical activity:    Days per week: Not on file    Minutes per session: Not on file  . Stress: Not on file  Relationships  . Social connections:    Talks on phone: Not on file    Gets together: Not  on file    Attends religious service: Not on file    Active member of club or organization: Not on file    Attends meetings of clubs or organizations: Not on file    Relationship status: Not on file  Other Topics Concern  . Not on file  Social History Narrative  . Not on file     Family History:  The patient's family history includes Cancer in her paternal grandmother; Heart attack in her father and mother; Heart disease in her father and mother; Stroke in her maternal grandfather and sister.   ROS:   Please see the history of present illness.    ROS All other systems reviewed and are negative.   PHYSICAL EXAM:   VS:  BP 120/72   Pulse 100   Ht 4\' 8"  (1.422 m)   Wt 126 lb (57.2 kg)   LMP 02/10/1993   SpO2 97%   BMI 28.25 kg/m    GEN: Well nourished, well developed, in no acute distress  HEENT: normal  Neck: no JVD, carotid bruits, or masses Cardiac:RRR; no murmurs, rubs, or gallops,no edema  Respiratory:  clear to auscultation bilaterally, normal work of breathing GI: soft, nontender, nondistended, + BS MS: no deformity or atrophy  Skin: warm and dry, no rash Neuro:  Alert and Oriented x 3, Strength and sensation are intact Psych: euthymic mood, full affect  Wt Readings from Last 3 Encounters:  05/02/18 126 lb (57.2 kg)  08/08/17 129 lb (58.5 kg)  06/19/17 128 lb 6.4 oz (58.2 kg)      Studies/Labs Reviewed:   EKG:  EKG is not  ordered today.    Recent Labs: No results found for requested labs within last 8760 hours.   Lipid Panel      Component Value Date/Time   CHOL 160 05/08/2016 0952   TRIG 72 05/08/2016 0952   HDL 86 05/08/2016 0952   CHOLHDL 1.9 05/08/2016 0952   VLDL 14 05/08/2016 0952   LDLCALC 60 05/08/2016 0952    Additional studies/ records that were reviewed today include:   Echocardiogram: 08/19/2015 Study Conclusions  - Left ventricle: The cavity size was normal. Wall thickness was   normal. Systolic function was normal. The estimated ejection   fraction was in the range of 60% to 65%. Wall motion was normal;   there were no regional wall motion abnormalities. Doppler   parameters are consistent with abnormal left ventricular   relaxation (grade 1 diastolic dysfunction). - Aortic valve: Trileaflet; mildly calcified leaflets. There was no   stenosis. - Mitral valve: Mildly calcified annulus. - Left atrium: The atrium was mildly dilated. - Tricuspid valve: There was mild regurgitation. - Pulmonary arteries: Systolic pressure was mildly increased. PA   peak pressure: 32 mm Hg (S). - Systemic veins: IVC mildly dilated with normal respiratory   variation. Estimated CVP 8 mmHg.  Left Heart Cath and Coronary Angiography  08/19/15  Conclusion   1. LM lesion, 40% stenosed. 2. Mid Cx lesion, 55% stenosed. 3. Ost Cx lesion, 30% stenosed.     Widely patent coronary arteries with less than 50% obstruction in the ostial LAD, mild diffuse mid circumflex disease, mild proximal circumflex disease, and widely patent right coronary artery   Normal left ventricular size and function, with EF greater than 60%.     ASSESSMENT & PLAN:    1. Chest pain Difficult to determine etiology.  Could be angina equivalent versus tachycardia.  She has elevated heart rate of 90-100s  at baseline.  Coronary angiography in October 2016 showed mild nonobstructive CAD.  She has no exertional symptoms.  Episode 2 weeks ago resolved after sublingual nitroglycerin x3.  No recurrence.  Discussed possible evaluation with stress  test and monitor versus low-dose beta-blocker.  We have decided to start metoprolol 12.5 mg twice daily (reluctuant to start at higher dose) and recheck symptoms in few weeks.  May consider monitor and stress test if recurrent symptoms. She will call us if worsening of symptoms.   2.  Sinus tachycardia/palpitation -As discussed above.  Start low-dose beta-blocker.  3.  Hyperlipidemia -Followed by PCP.  She will bring lab work during next office visit.  Continue Lipitor 40 mg daily.  4.  Hypertension -Blood pressure stable.   Medication Adjustments/Labs and Tests Ordered: Current medicines are reviewed at length with the patient today.  Concerns regarding medicines are outlined above.  Medication changes, Labs and Tests ordered today are listed in the Patient Instructions below. Patient Instructions  Medication Instructions:  Your physician has recommended you make the following change in your medication:  1.  START Metoprolol 25 mg taking 1/2 tablet twice a day  Labwork: None ordered  Testing/Procedures: None ordered  Follow-Up: Your physician wants you to follow-up in: 06/11/18 ARRIVE AT 11:00 TO SEE SCOTT WEAVER, PA-C.    Any Other Special Instructions Will Be Listed Below (If Applicable).     If you need a refill on your cardiac medications before your next appointment, please call your pharmacy.      Jarrett Soho, [place]  05/02/2018 10:46 AM    [facility] [address], [place], [place]  [address] Phone: [phone]; Fax: [phone]

## 2018-05-02 ENCOUNTER — Encounter (INDEPENDENT_AMBULATORY_CARE_PROVIDER_SITE_OTHER): Payer: Self-pay

## 2018-05-02 ENCOUNTER — Ambulatory Visit: Payer: Medicare Other | Admitting: Physician Assistant

## 2018-05-02 ENCOUNTER — Encounter: Payer: Self-pay | Admitting: Physician Assistant

## 2018-05-02 VITALS — BP 120/72 | HR 100 | Ht <= 58 in | Wt 126.0 lb

## 2018-05-02 DIAGNOSIS — I2510 Atherosclerotic heart disease of native coronary artery without angina pectoris: Secondary | ICD-10-CM

## 2018-05-02 DIAGNOSIS — E782 Mixed hyperlipidemia: Secondary | ICD-10-CM

## 2018-05-02 DIAGNOSIS — I10 Essential (primary) hypertension: Secondary | ICD-10-CM | POA: Diagnosis not present

## 2018-05-02 DIAGNOSIS — R002 Palpitations: Secondary | ICD-10-CM

## 2018-05-02 MED ORDER — METOPROLOL TARTRATE 25 MG PO TABS: 12.5000 mg | ORAL_TABLET | Freq: Two times a day (BID) | ORAL | 3 refills | Status: DC

## 2018-05-02 NOTE — Patient Instructions (Addendum)
Medication Instructions:  Your physician has recommended you make the following change in your medication:  1.  START Metoprolol 25 mg taking 1/2 tablet twice a day  Labwork: None ordered  Testing/Procedures: None ordered  Follow-Up: Your physician wants you to follow-up in: 06/11/18 ARRIVE AT 11:00 TO SEE SCOTT WEAVER, PA-C.    Any Other Special Instructions Will Be Listed Below (If Applicable).     If you need a refill on your cardiac medications before your next appointment, please call your pharmacy.

## 2018-05-27 ENCOUNTER — Other Ambulatory Visit: Payer: Self-pay | Admitting: Cardiology

## 2018-05-27 NOTE — Telephone Encounter (Signed)
Rx request sent to pharmacy.  

## 2018-06-11 ENCOUNTER — Encounter: Payer: Self-pay | Admitting: Physician Assistant

## 2018-06-11 ENCOUNTER — Encounter (INDEPENDENT_AMBULATORY_CARE_PROVIDER_SITE_OTHER): Payer: Self-pay

## 2018-06-11 ENCOUNTER — Ambulatory Visit: Payer: Medicare Other | Admitting: Physician Assistant

## 2018-06-11 VITALS — BP 128/70 | HR 68 | Ht <= 58 in | Wt 126.8 lb

## 2018-06-11 DIAGNOSIS — I10 Essential (primary) hypertension: Secondary | ICD-10-CM | POA: Diagnosis not present

## 2018-06-11 DIAGNOSIS — E782 Mixed hyperlipidemia: Secondary | ICD-10-CM

## 2018-06-11 DIAGNOSIS — I2510 Atherosclerotic heart disease of native coronary artery without angina pectoris: Secondary | ICD-10-CM | POA: Diagnosis not present

## 2018-06-11 MED ORDER — METOPROLOL TARTRATE 25 MG PO TABS: ORAL_TABLET | ORAL | Status: DC

## 2018-06-11 NOTE — Patient Instructions (Addendum)
Medication Instructions:  Decrease Metoprolol Tartrate to 12.5 mg once daily for 2 days, then stop.  Labwork: None   Testing/Procedures: None   Follow-Up: Dorris Carnes, MD in 6 months.  Any Other Special Instructions Will Be Listed Below (If Applicable).  If you feel worse off of the Metoprolol, resume it at 12.5 mg Twice daily   If you have more chest pain, call so that we can arrange a stress test.  If you need a refill on your cardiac medications before your next appointment, please call your pharmacy.

## 2018-06-11 NOTE — Progress Notes (Addendum)
Cardiology Office Note:    Date:  06/11/2018   ID:  [name] [name], DOB 1940/11/27, MRN [id number]  PCP:  Sinda Du, MD  Cardiologist:  Dorris Carnes, MD    Referring MD: Sinda Du, MD   Chief Complaint  Patient presents with  . Follow-up     History of Present Illness:    [name] [name] is a 77 y.o. female with coronary artery disease, hypertension, hyperlipidemia, hypothyroidism, fibromyalgia.  She was admitted for chest pain in October 2016 and ruled out for myocardial infarction.  LV function was normal on echocardiogram with mild diastolic dysfunction.  Cardiac catheterization demonstrated nonobstructive coronary artery disease and medical therapy was continued.  She was last seen in clinic by Leanor Kail, PA-C in 04/2018.  She complained of one episode of chest pain and palpitations.  Stress testing and an event monitor was discussed.  However, it was ultimately decided to start on low dose beta-blocker.    [name] [name] returns for follow-up.  She has felt more fatigued on metoprolol.  She really has not noticed much of a difference other than being fatigued with this medication.  She has not had any other chest discomfort since June.  She does have some shortness of breath with going up her steps.  This is overall stable without significant change.  She denies orthopnea, PND, syncope.  She does have some lower extremity swelling.  She wears a knee brace sometimes and notices increased swelling in her left leg.    Prior CV studies:   The following studies were reviewed today:  Cardiac catheterization 08/19/2015  LM 40 LCx ostial 30, mid 55   Widely patent coronary arteries with less than 50% obstruction in the ostial LAD, mild diffuse mid circumflex disease, mild proximal circumflex disease, and widely patent right coronary artery   Normal left ventricular size and function, with EF greater than 60%.  Echocardiogram 08/19/2015 EF 60-65, normal wall motion, grade 1  diastolic dysfunction, mild MAC, mild LAE, mild TR, PASP 32  Cardiac Catheterization 2010 RCA prox 20-30 LAD ostial 50-60 Normal EF  Past Medical History:  Diagnosis Date  . Arthritis   . Bipolar affective disorder ([place])   . Cataract   . Chronic back pain   . Chronic fatigue   . Dizziness   . Dyslipidemia   . Fibromyalgia   . GERD (gastroesophageal reflux disease)   . H/O: hysterectomy   . Hx of adenomatous colonic polyps 2005  . Hyperlipidemia   . Hypertension   . Knee pain   . Non-obstructive CAD    a. 08/2015 Cath: LM 40, LCX small, 30ost, 88m OM2 small, RCA nl/small, EF 55-65%.  . Osteopenia   . Sciatica    Surgical Hx: The patient  has a past surgical history that includes Colonoscopy (01/29/2007); austin bunionectomy; Esophagogastroduodenoscopy; Cystectomy; Breast lumpectomy; Spinal fusion; right knee replacement (02/2013); Breast biopsy (Left, 1989); Shoulder arthroscopy (Left, 2005); Bunionectomy (Left, 2005); Spinal fusion (2004); Total hip arthroplasty (Right, 06/2006); Total shoulder replacement (Left, 08/2005); Total hip arthroplasty (8/12); Abdominal hysterectomy (1994); Shoulder arthroscopy (Right); Colonoscopy with esophagogastroduodenoscopy (egd) (N/A, 02/01/2014); Cardiac catheterization (2010); and Cardiac catheterization (N/A, 08/19/2015).   Current Medications: Current Meds  Medication Sig  . ALPRAZolam (XANAX) 0.5 MG tablet Take 1 tablet by mouth daily as needed. anxiety  . aspirin 81 MG tablet Take 81 mg by mouth daily.  .Marland Kitchenatorvastatin (LIPITOR) 40 MG tablet TAKE (1) TABLET BY MOUTH AT BEDTIME.  .Marland KitchenCALCIUM PO Take by  mouth daily.  . Cholecalciferol (VITAMIN D PO) Take 2,000 Int'l Units by mouth daily.  . DULoxetine (CYMBALTA) 60 MG capsule Take 120 mg by mouth daily.   Marland Kitchen estradiol (ESTRACE) 1 MG tablet Take 0.5 tablets (0.5 mg total) by mouth daily. Take 1/2 tablet po daily  . lamoTRIgine (LAMICTAL) 150 MG tablet Take 150 mg by mouth at bedtime.  Marland Kitchen  levothyroxine (LEVOTHROID) 25 MCG tablet Take 1 tablet (25 mcg total) by mouth daily.  Marland Kitchen losartan (COZAAR) 50 MG tablet Take 1 tablet (50 mg total) by mouth daily.  . meloxicam (MOBIC) 7.5 MG tablet Take 7.5 mg by mouth 2 (two) times daily.   . Multiple Vitamin (MULTIVITAMIN) tablet Take 1 tablet by mouth daily.    . niacin (SLO-NIACIN) 500 MG tablet Take 500 mg by mouth daily.   . nitroGLYCERIN (NITROSTAT) 0.4 MG SL tablet PLACE 1 TAB UNDER TONGUE EVERY 5 MIN IF NEEDED FOR CHEST PAIN. MAY USE 3 TIMES.NO RELIEF CALL 911.  . Omega-3 Fatty Acids (FISH OIL) 1000 MG CAPS Take 1 capsule by mouth daily.   . Probiotic Product (PROBIOTIC DAILY PO) Take 1 tablet by mouth every morning. Florify  . RESTASIS 0.05 % ophthalmic emulsion Place 1 drop into both eyes 2 (two) times daily.   . risperiDONE (RISPERDAL) 1 MG tablet Take 0.5 mg by mouth at bedtime.   . WELLBUTRIN XL 300 MG 24 hr tablet Take 300 mg by mouth daily.   Marland Kitchen zolpidem (AMBIEN) 10 MG tablet Take 10 mg by mouth at bedtime as needed for sleep.   . [DISCONTINUED] metoprolol tartrate (LOPRESSOR) 25 MG tablet Take 0.5 tablets (12.5 mg total) by mouth 2 (two) times daily.     Allergies:   Hydrocodone-acetaminophen and Vicodin [hydrocodone-acetaminophen]   Social History   Tobacco Use  . Smoking status: Never Smoker  . Smokeless tobacco: Never Used  Substance Use Topics  . Alcohol use: Yes    Comment: less than 6  . Drug use: No     Family Hx: The patient's family history includes Cancer in her paternal grandmother; Heart attack in her father and mother; Heart disease in her father and mother; Stroke in her maternal grandfather and sister. There is no history of Colon cancer.  ROS:   Please see the history of present illness.    Review of Systems  Constitution: Positive for diaphoresis and malaise/fatigue.  Respiratory: Positive for shortness of breath and snoring.   Hematologic/Lymphatic: Bruises/bleeds easily.  Musculoskeletal:  Positive for back pain, joint swelling and myalgias.  Gastrointestinal: Positive for abdominal pain.  Neurological: Positive for dizziness and loss of balance.  Psychiatric/Behavioral: The patient is nervous/anxious.    All other systems reviewed and are negative.   EKGs/Labs/Other Test Reviewed:    EKG:  EKG is  ordered today.  The ekg ordered today demonstrates sinus bradycardia, heart rate 57, normal axis, QTC 402, similar to prior tracings  Recent Labs: No results found for requested labs within last 8760 hours.   Recent Lipid Panel Lab Results  Component Value Date/Time   CHOL 160 05/08/2016 09:52 AM   TRIG 72 05/08/2016 09:52 AM   HDL 86 05/08/2016 09:52 AM   CHOLHDL 1.9 05/08/2016 09:52 AM   LDLCALC 60 05/08/2016 09:52 AM    Physical Exam:    VS:  BP 128/70   Pulse 68   Ht _0  (1.448 m)   Wt 126 lb 12.8 oz (57.5 kg)   LMP 02/10/1993   SpO2 95%  BMI 27.44 kg/m     Wt Readings from Last 3 Encounters:  06/11/18 126 lb 12.8 oz (57.5 kg)  05/02/18 126 lb (57.2 kg)  08/08/17 129 lb (58.5 kg)     Physical Exam  Constitutional: She is oriented to person, place, and time. She appears well-developed and well-nourished. No distress.  HENT:  Head: Normocephalic and atraumatic.  Eyes: No scleral icterus.  Neck: Neck supple.  Cardiovascular: Normal rate and regular rhythm.  No murmur heard. Pulmonary/Chest: Effort normal. She has no wheezes. She has no rales.  Abdominal: Soft.  Musculoskeletal: She exhibits no edema.  Neurological: She is alert and oriented to person, place, and time.  Skin: Skin is warm and dry.  Psychiatric: She has a normal mood and affect.    ASSESSMENT & PLAN:    Coronary artery disease involving native coronary artery of native heart without angina pectoris She has had 3 heart catheterizations in the past.  Her last heart catheterization 2016 demonstrated moderate nonobstructive disease.  This was largely unchanged from her previous  cardiac catheterization in 2010.  She was seen last month with an episode of chest discomfort.  She also had some palpitations and was placed on metoprolol.  She denies any chest pain since that time.  However, she does feel more fatigued with metoprolol.  She would like to come off this medication if she can.  At this point, I am not convinced that she needs to continue on long-term beta-blocker therapy.  I have agreed for her to come off of this medication.  If she feels worse off of it, she can certainly resume it.  If she has more chest symptoms in the near future, we will need to consider stress testing to further evaluate.  Otherwise, continue aspirin, statin.  -Decrease metoprolol tartrate to 1/2 tablet daily x2 days, then stop  Essential hypertension The patient's blood pressure is controlled on her current regimen.  Continue current therapy.   Mixed hyperlipidemia Managed by primary care.  Most recent lipids will be requested.  Dispo:  Return in about 6 months (around 12/12/2018) for Routine Follow Up, w/ Dr. Harrington Challenger.   Medication Adjustments/Labs and Tests Ordered: Current medicines are reviewed at length with the patient today.  Concerns regarding medicines are outlined above.  Tests Ordered: Orders Placed This Encounter  Procedures  . EKG 12-Lead   Medication Changes: Meds ordered this encounter  Medications  . metoprolol tartrate (LOPRESSOR) 25 MG tablet    Sig: Take 1/2 tablet daiy for 2 days then STOP    Signed, Richardson Dopp, PA-C  06/11/2018 12:00 PM    [facility] [address], [place], [place]  [address] Phone: [phone]; Fax: [phone]   Addendum 06/11/2018 12:26 PM:  Labs from PCP received.   05/05/2018: K 4.7;  03/25/2018: Hgb 13.4, creatinine 0.88, ALT 29, TC 167, triglycerides 81, HDL 80, LDL 71. Richardson Dopp, PA-C    06/11/2018 12:27 PM

## 2018-07-28 ENCOUNTER — Other Ambulatory Visit: Payer: Self-pay | Admitting: Physician Assistant

## 2018-08-19 ENCOUNTER — Other Ambulatory Visit: Payer: Self-pay

## 2018-08-19 ENCOUNTER — Ambulatory Visit (INDEPENDENT_AMBULATORY_CARE_PROVIDER_SITE_OTHER): Payer: Medicare Other | Admitting: Certified Nurse Midwife

## 2018-08-19 ENCOUNTER — Encounter: Payer: Self-pay | Admitting: Certified Nurse Midwife

## 2018-08-19 VITALS — BP 120/82 | HR 70 | Resp 16 | Ht <= 58 in | Wt 124.0 lb

## 2018-08-19 DIAGNOSIS — N951 Menopausal and female climacteric states: Secondary | ICD-10-CM | POA: Diagnosis not present

## 2018-08-19 DIAGNOSIS — Z01419 Encounter for gynecological examination (general) (routine) without abnormal findings: Secondary | ICD-10-CM | POA: Diagnosis not present

## 2018-08-19 DIAGNOSIS — M8580 Other specified disorders of bone density and structure, unspecified site: Secondary | ICD-10-CM

## 2018-08-19 DIAGNOSIS — Z780 Asymptomatic menopausal state: Secondary | ICD-10-CM

## 2018-08-19 MED ORDER — ESTRADIOL 1 MG PO TABS: ORAL_TABLET | ORAL | 6 refills | Status: DC

## 2018-08-19 NOTE — Progress Notes (Signed)
77 y.o. [id number] Widowed  Caucasian Fe here for annual exam. Postmenopausal, denies vaginal dryness or bleeding. Occasional urinary leakage if holds to long. Osteopenia and arthritis still an issue. Feels Estrace is helping with bone support, taking 1/2 tablet daily. Would like to continue, feels it also helps vaginally. PCP management of thyroid, hypertension, cholesterol, anxiety,Bipolar, and sleep issues, all medication stable per patient. Last labs with PCP.  Constipation still an issue, but uses stool softener with good results. Denies blood in stool or tarry stools. Added another great grandchild to the family.. No other health issues today.  Patient's last menstrual period was 02/10/1993.          Sexually active: No.  The current method of family planning is status post hysterectomy.    Exercising: Yes.    walking & steps Smoker:  no  Review of Systems  Gastrointestinal: Positive for constipation.       Bloating    Health Maintenance: Pap:  07-06-02 neg History of Abnormal Pap: no MMG: 2018 waiting on fax for 2019 Self Breast exams: no Colonoscopy:  2015 f/u 74yrs BMD:   2018 rheumatologist follows TDaP:  2014 Shingles: 2019 had 1st inj Pneumonia: 2018 Hep C and HIV: not done Labs: if needed, PCP   reports that she has never smoked. She has never used smokeless tobacco. She reports that she drinks about 5.0 - 6.0 standard drinks of alcohol per week. She reports that she does not use drugs.  Past Medical History:  Diagnosis Date  . Arthritis   . Bipolar affective disorder ([place])   . Cataract   . Chronic back pain   . Chronic fatigue   . Dizziness   . Dyslipidemia   . Fibromyalgia   . GERD (gastroesophageal reflux disease)   . H/O: hysterectomy   . Hx of adenomatous colonic polyps 2005  . Hyperlipidemia   . Hypertension   . Knee pain   . Non-obstructive CAD    a. 08/2015 Cath: LM 40, LCX small, 30ost, 22m, OM2 small, RCA nl/small, EF 55-65%.  . Osteopenia   . Sciatica      Past Surgical History:  Procedure Laterality Date  . ABDOMINAL HYSTERECTOMY  1994   TAH- DUB, BSO  . [place] bunionectomy     bilateral  . BREAST BIOPSY Left 1989  . BREAST LUMPECTOMY    . BUNIONECTOMY Left 2005  . CARDIAC CATHETERIZATION  2010   +CAD  . CARDIAC CATHETERIZATION N/A 08/19/2015   Procedure: Left Heart Cath and Coronary Angiography;  Surgeon: Belva Crome, MD;  Location: [facility];  Service: Cardiovascular;  Laterality: N/A;  . 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  Remainder of colonic mucosa was normal  . COLONOSCOPY WITH ESOPHAGOGASTRODUODENOSCOPY (EGD) N/A 02/01/2014   [id number] erosive reflux/gastric polyp/normal rectum/scattered left sided diverticula, normal distal TI. gastric bx negative, fundic gland polyp. next TCS 01/2019.  Marland Kitchen CYSTECTOMY     pilonidial cyst  . ESOPHAGOGASTRODUODENOSCOPY     followed by colonoscopy with snare polypectomy  . right knee replacement  02/2013  . SHOULDER ARTHROSCOPY Left 2005  . SHOULDER ARTHROSCOPY Right   . SPINAL FUSION    . SPINAL FUSION  2004   L4-L5  . TOTAL HIP ARTHROPLASTY Right 06/2006  . TOTAL HIP ARTHROPLASTY  8/12  . TOTAL SHOULDER REPLACEMENT Left 08/2005    Current Outpatient Medications  Medication Sig Dispense Refill  . ALPRAZolam (XANAX) 0.5 MG  tablet Take 1 tablet by mouth daily as needed. anxiety    . aspirin 81 MG tablet Take 81 mg by mouth daily.    Marland Kitchen atorvastatin (LIPITOR) 40 MG tablet TAKE (1) TABLET BY MOUTH AT BEDTIME. 30 tablet 10  . CALCIUM PO Take by mouth daily.    . Cholecalciferol (VITAMIN D PO) Take 2,000 Int'l Units by mouth daily.    . DULoxetine (CYMBALTA) 60 MG capsule Take 120 mg by mouth daily.     Marland Kitchen estradiol (ESTRACE) 1 MG tablet Take 0.5 tablets (0.5 mg total) by mouth daily. Take 1/2 tablet po daily 30 tablet 6  . lamoTRIgine (LAMICTAL) 150 MG tablet Take 150 mg by mouth at bedtime.     Marland Kitchen levothyroxine (LEVOTHROID) 25 MCG tablet Take 1 tablet (25 mcg total) by mouth daily. 30 tablet 12  . losartan (COZAAR) 50 MG tablet Take 1 tablet (50 mg total) by mouth daily. 90 tablet 3  . meloxicam (MOBIC) 7.5 MG tablet Take 7.5 mg by mouth 2 (two) times daily.     . Multiple Vitamin (MULTIVITAMIN) tablet Take 1 tablet by mouth daily.      . niacin (SLO-NIACIN) 500 MG tablet Take 500 mg by mouth daily.     . Omega-3 Fatty Acids (FISH OIL) 1000 MG CAPS Take 1 capsule by mouth daily.     . Probiotic Product (PROBIOTIC DAILY PO) Take 1 tablet by mouth every morning. Florify    . RESTASIS 0.05 % ophthalmic emulsion Place 1 drop into both eyes 2 (two) times daily.     . risperiDONE (RISPERDAL) 1 MG tablet Take 0.5 mg by mouth at bedtime.     . WELLBUTRIN XL 300 MG 24 hr tablet Take 300 mg by mouth daily.     Marland Kitchen zolpidem (AMBIEN) 10 MG tablet Take 10 mg by mouth at bedtime as needed for sleep.     . nitroGLYCERIN (NITROSTAT) 0.4 MG SL tablet PLACE 1 TAB UNDER TONGUE EVERY 5 MIN IF NEEDED FOR CHEST PAIN. MAY USE 3 TIMES.NO RELIEF CALL 911. (Patient not taking: Reported on 08/19/2018) 25 tablet 3   No current facility-administered medications for this visit.     Family History  Problem Relation Age of Onset  . Heart disease Mother   . Heart attack Mother   . Heart disease Father   . Heart attack Father   . Stroke Sister   . Stroke Maternal Grandfather   . Cancer Paternal Grandmother        unknown primary  . Colon cancer Neg Hx     ROS:  Pertinent items are noted in HPI.  Otherwise, a comprehensive ROS was negative.  Exam:   BP 120/82   Pulse 70   Resp 16   Ht 4' 7.75" (1.416 m)   Wt 124 lb (56.2 kg)   LMP 02/10/1993   BMI 28.05 kg/m  Height: 4' 7.75" (141.6 cm) Ht Readings from Last 3 Encounters:  08/19/18 4' 7.75" (1.416 m)  06/11/18 4\' 9"  (1.448 m)  05/02/18 4\' 8"  (1.422 m)    General appearance: alert, cooperative and appears stated age Head: Normocephalic, without  obvious abnormality, atraumatic Neck: no adenopathy, supple, symmetrical, trachea midline and thyroid normal to inspection and palpation Lungs: clear to auscultation bilaterally Breasts: normal appearance, no masses or tenderness, No nipple retraction or dimpling, No nipple discharge or bleeding, No axillary or supraclavicular adenopathy Heart: regular rate and rhythm Abdomen: soft, non-tender; no masses,  no organomegaly Extremities: extremities normal,  atraumatic, no cyanosis or edema Skin: Skin color, texture, turgor normal. No rashes or lesions Lymph nodes: Cervical, supraclavicular, and axillary nodes normal. No abnormal inguinal nodes palpated Neurologic: Grossly normal   Pelvic: External genitalia:  no lesions              Urethra:  normal appearing urethra with no masses, tenderness or lesions              Bartholin's and Skene's: normal                 Vagina: normal appearing vagina with normal color and discharge, no lesions              Cervix: absent              Pap taken: No. Bimanual Exam:  Uterus:  uterus absent              Adnexa: no mass, fullness, tenderness and adnexa surgically absent bilateral               Rectovaginal: Confirms               Anus:  normal sphincter tone, no lesions  Chaperone present: yes  A:  Well Woman with normal exam  Menopausal on HRT for bone support, Osteopenia and well being  S/P TAH with BSO for DUB  Osteopenia with PCP management  Hypothyroid, hypertension, cholesterol, Bipolar with MD management      P:   Reviewed health and wellness pertinent to exam  Discussed risks/benefits/warning signs of ERT use for well being and bone support. Patient desires continuance. Aware she needs to keep mammogram up to date and if cardiovascular status changes needs to advise.  Rx Estrace see order with instructions  Continue follow up with MD as indicated  Pap smear: no   counseled on breast self exam, mammography screening, feminine  hygiene, adequate intake of calcium and vitamin D, diet and exercise, Kegel's exercises  return annually or prn  An After Visit Summary was printed and given to the patient.

## 2018-08-19 NOTE — Patient Instructions (Signed)

## 2018-08-26 ENCOUNTER — Encounter: Payer: Self-pay | Admitting: Certified Nurse Midwife

## 2018-08-27 ENCOUNTER — Other Ambulatory Visit: Payer: Self-pay | Admitting: Cardiology

## 2019-01-05 ENCOUNTER — Other Ambulatory Visit (HOSPITAL_COMMUNITY): Payer: Self-pay | Admitting: Pulmonary Disease

## 2019-01-05 DIAGNOSIS — M25562 Pain in left knee: Secondary | ICD-10-CM

## 2019-01-13 ENCOUNTER — Ambulatory Visit (HOSPITAL_COMMUNITY)
Admission: RE | Admit: 2019-01-13 | Discharge: 2019-01-13 | Disposition: A | Discharge status: Home / Self Care | Payer: Medicare Other | Source: Ambulatory Visit | Attending: Pulmonary Disease | Admitting: Pulmonary Disease

## 2019-01-13 DIAGNOSIS — M25562 Pain in left knee: Secondary | ICD-10-CM | POA: Diagnosis not present

## 2019-02-17 ENCOUNTER — Other Ambulatory Visit: Payer: Self-pay | Admitting: Cardiology

## 2019-03-19 ENCOUNTER — Other Ambulatory Visit: Payer: Self-pay | Admitting: Physician Assistant

## 2019-04-17 LAB — HEMOGLOBIN A1C: Hemoglobin A1C: 5.4

## 2019-04-17 LAB — HEPATIC FUNCTION PANEL
ALT: 30 (ref 7–35)
AST: 32 (ref 13–35)
Alkaline Phosphatase: 94 (ref 25–125)
Bilirubin, Total: 0.4

## 2019-04-17 LAB — COMPREHENSIVE METABOLIC PANEL
Albumin: 4.2 (ref 3.5–5.0)
Calcium: 9.7 (ref 8.7–10.7)
GFR calc Af Amer: 89
GFR calc non Af Amer: 77
Globulin: 2.2

## 2019-04-17 LAB — BASIC METABOLIC PANEL
BUN: 10 (ref 4–21)
CO2: 26 — AB (ref 13–22)
Chloride: 96 — AB (ref 99–108)
Creatinine: 0.8 (ref <=1.1)
Glucose: 109
Potassium: 4.7 (ref 3.4–5.3)
Sodium: 135 — AB (ref 137–147)

## 2019-04-17 LAB — LIPID PANEL
Cholesterol: 171 (ref 0–200)
HDL: 67 (ref 35–70)
LDL Cholesterol: 79
Triglycerides: 127 (ref 40–160)

## 2019-04-17 LAB — TSH: TSH: 4.71 (ref <=5.90)

## 2019-04-17 LAB — CBC: RBC: 4.44 (ref 3.87–5.11)

## 2019-04-18 LAB — CBC AND DIFFERENTIAL
HCT: 41 (ref 36–46)
Hemoglobin: 14 (ref 12.0–16.0)
Platelets: 325 (ref 150–399)
WBC: 7.9

## 2019-07-23 ENCOUNTER — Encounter: Payer: Self-pay | Admitting: Internal Medicine

## 2019-08-14 ENCOUNTER — Ambulatory Visit: Payer: Medicare Other | Admitting: Gastroenterology

## 2019-08-14 ENCOUNTER — Encounter: Payer: Self-pay | Admitting: Gastroenterology

## 2019-08-14 ENCOUNTER — Other Ambulatory Visit: Payer: Self-pay

## 2019-08-14 VITALS — BP 127/84 | HR 90 | Temp 97.0°F | Ht <= 58 in | Wt 123.8 lb

## 2019-08-14 DIAGNOSIS — K219 Gastro-esophageal reflux disease without esophagitis: Secondary | ICD-10-CM | POA: Diagnosis not present

## 2019-08-14 DIAGNOSIS — Z86010 Personal history of colonic polyps: Secondary | ICD-10-CM

## 2019-08-14 DIAGNOSIS — R1013 Epigastric pain: Secondary | ICD-10-CM

## 2019-08-14 DIAGNOSIS — K5900 Constipation, unspecified: Secondary | ICD-10-CM

## 2019-08-14 NOTE — Patient Instructions (Addendum)
1. Colonoscopy and upper endoscopy as scheduled.  Please see separate instructions. 2. Continue Tagamet daily.  If your reflux is not well controlled or you have worsening abdominal pain, would consider restarting medication in the family of Prilosec, Nexium, Prevacid etc.

## 2019-08-14 NOTE — Assessment & Plan Note (Signed)
Recurrent reflux symptoms.  Came off PPI due to concerns for risk of long-term use.  Currently taking over-the-counter Tagamet daily and Alka-Seltzer as needed.  Some central abdominal pain in the setting of Mobic use.  Offered her upper endoscopy to evaluate for possible gastritis, peptic ulcer disease, complicated reflux.  Due to polypharmacy, plan for deep sedation.  I have discussed the risks, alternatives, benefits with regards to but not limited to the risk of reaction to medication, bleeding, infection, perforation and the patient is agreeable to proceed. Written consent to be obtained.  If worsening abdominal pain, refractory reflux, would recommend resuming PPI therapy.  She will let me know.  Previously she did not tolerate Dexilant and her pulse was refractory to omeprazole and pantoprazole.

## 2019-08-14 NOTE — Assessment & Plan Note (Signed)
Patient doing well on over-the-counter bisacodyl twice per week.  She is not interested in pursuing prescription options.  Continue current regimen.

## 2019-08-14 NOTE — Assessment & Plan Note (Signed)
Due for surveillance colonoscopy for history of adenomatous colon polyps.  We discussed chang in guidelines and potentially not needing colonoscopy at this time due to age, however she would like to pursue 1 more.  I have discussed the risks, alternatives, benefits with regards to but not limited to the risk of reaction to medication, bleeding, infection, perforation and the patient is agreeable to proceed. Written consent to be obtained.

## 2019-08-14 NOTE — Progress Notes (Signed)
Primary Care Physician:  Sinda Du, MD  Primary Gastroenterologist:  Garfield Cornea, MD   Chief Complaint  Patient presents with  . Consult    TCS last done 2015  . Constipation    HPI:  [name] [name] is a 78 y.o. female here for surveillance colonoscopy.  She has a history of colonic adenomas.  Last colonoscopy was in March 2015, left-sided diverticulosis, normal distal TI.  Recommended 5 year surveillance colonoscopy.  Last EGD March 2015 with mild erosive reflux esophagitis, gastric polyp which was fundic gland.  Negative gastric biopsies.  Patient states she has some central abdominal pain, nausea but no vomiting.  She wonders if it is related to Mobic.  She is cut back on her dose from every day to every other day.  At some point she came off PPI therapy.  States he was concerned about long-term use and a provider recommended she stop it.  She has been having heartburn 2-3 times per week, taking Alka-Seltzer as needed.  Recently started on Tagamet 1 daily over-the-counter.  Denies dysphagia.  No melena or rectal bleeding.  She manages her constipation with bisacodyl twice per week.  No unintentional weight loss.   Current Outpatient Medications  Medication Sig Dispense Refill  . ALPRAZolam (XANAX) 0.5 MG tablet Take 1 tablet by mouth daily as needed. anxiety    . aspirin 81 MG tablet Take 81 mg by mouth daily.    Marland Kitchen atorvastatin (LIPITOR) 40 MG tablet TAKE (1) TABLET BY MOUTH AT BEDTIME. 90 tablet 2  . Bisacodyl (WOMENS LAXATIVE PO) Take by mouth as needed.    Marland Kitchen CALCIUM PO Take by mouth daily.    . Cholecalciferol (VITAMIN D PO) Take 2,000 Int'l Units by mouth daily.    . Cimetidine (TAGAMET PO) Take by mouth daily.    . DULoxetine (CYMBALTA) 60 MG capsule Take 120 mg by mouth daily.     Marland Kitchen estradiol (ESTRACE) 1 MG tablet Take 1/2 tablet po daily 30 tablet 6  . lamoTRIgine (LAMICTAL) 150 MG tablet Take 150 mg by mouth at bedtime.    Marland Kitchen levothyroxine (LEVOTHROID) 25 MCG tablet  Take 1 tablet (25 mcg total) by mouth daily. 30 tablet 12  . losartan (COZAAR) 50 MG tablet TAKE (1) TABLET BY MOUTH ONCE DAILY. 90 tablet 1  . Magnesium Hydroxide (MILK OF MAGNESIA PO) Take by mouth as needed.    . meloxicam (MOBIC) 7.5 MG tablet Take 7.5 mg by mouth 2 (two) times daily.     . Multiple Vitamin (MULTIVITAMIN) tablet Take 1 tablet by mouth daily.      . niacin (SLO-NIACIN) 500 MG tablet Take 500 mg by mouth daily.     . nitroGLYCERIN (NITROSTAT) 0.4 MG SL tablet PLACE 1 TAB UNDER TONGUE EVERY 5 MIN IF NEEDED FOR CHEST PAIN. MAY USE 3 TIMES.NO RELIEF CALL 911. 25 tablet 3  . Omega-3 Fatty Acids (FISH OIL) 1000 MG CAPS Take 1 capsule by mouth daily.     . Probiotic Product (PROBIOTIC DAILY PO) Take 1 tablet by mouth every morning. Florify    . RESTASIS 0.05 % ophthalmic emulsion Place 1 drop into both eyes 2 (two) times daily.     . risperiDONE (RISPERDAL) 1 MG tablet Take 0.5 mg by mouth at bedtime.     . WELLBUTRIN XL 300 MG 24 hr tablet Take 300 mg by mouth daily.      No current facility-administered medications for this visit.     Allergies as  of 08/14/2019 - Review Complete 08/14/2019  Allergen Reaction Noted  . Hydrocodone-acetaminophen Hives 07/05/2008  . Vicodin [hydrocodone-acetaminophen] Itching 06/26/2013    Past Medical History:  Diagnosis Date  . Arthritis   . Bipolar affective disorder ([place])   . Cataract   . Chronic back pain   . Chronic fatigue   . Dizziness   . Dyslipidemia   . Fibromyalgia   . GERD (gastroesophageal reflux disease)   . H/O: hysterectomy   . Hx of adenomatous colonic polyps 2005  . Hyperlipidemia   . Hypertension   . Knee pain   . Non-obstructive CAD    a. 08/2015 Cath: LM 40, LCX small, 30ost, 28m, OM2 small, RCA nl/small, EF 55-65%.  . Osteopenia   . Sciatica     Past Surgical History:  Procedure Laterality Date  . ABDOMINAL HYSTERECTOMY  1994   TAH- DUB, BSO  . [place] bunionectomy     bilateral  . BREAST BIOPSY Left  1989  . BREAST LUMPECTOMY    . BUNIONECTOMY Left 2005  . CARDIAC CATHETERIZATION  2010   +CAD  . CARDIAC CATHETERIZATION N/A 08/19/2015   Procedure: Left Heart Cath and Coronary Angiography;  Surgeon: Belva Crome, MD;  Location: [facility];  Service: Cardiovascular;  Laterality: N/A;  . 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  Remainder of colonic mucosa was normal  . COLONOSCOPY WITH ESOPHAGOGASTRODUODENOSCOPY (EGD) N/A 02/01/2014   [id number] erosive reflux/gastric polyp/normal rectum/scattered left sided diverticula, normal distal TI. gastric bx negative, fundic gland polyp. next TCS 01/2019.  Marland Kitchen CYSTECTOMY     pilonidial cyst  . ESOPHAGOGASTRODUODENOSCOPY     followed by colonoscopy with snare polypectomy  . right knee replacement  02/2013  . SHOULDER ARTHROSCOPY Left 2005  . SHOULDER ARTHROSCOPY Right   . SPINAL FUSION    . SPINAL FUSION  2004   L4-L5  . TOTAL HIP ARTHROPLASTY Right 06/2006  . TOTAL HIP ARTHROPLASTY  8/12  . TOTAL SHOULDER REPLACEMENT Left 08/2005    Family History  Problem Relation Age of Onset  . Heart disease Mother   . Heart attack Mother   . Heart disease Father   . Heart attack Father   . Stroke Sister   . Stroke Maternal Grandfather   . Cancer Paternal Grandmother        unknown primary  . Colon cancer Neg Hx     Social History   Socioeconomic History  . Marital status: Widowed    Spouse name: Not on file  . Number of children: Not on file  . Years of education: Not on file  . Highest education level: Not on file  Occupational History  . Occupation: unemployed    Fish farm manager: RETIRED    Comment: retired [employer], [employer]  Social Needs  . Financial resource strain: Not on file  . Food insecurity    Worry: Not on file    Inability: Not on file  . Transportation needs    Medical: Not on file    Non-medical: Not on file  Tobacco Use  . Smoking status:  Never Smoker  . Smokeless tobacco: Never Used  Substance and Sexual Activity  . Alcohol use: Yes    Alcohol/week: 1.0 standard drinks    Types: 1 Glasses of wine per week    Comment: almost daily  . Drug use: No  . Sexual activity: Not Currently    Partners: Male  Birth control/protection: Surgical    Comment: TAH  Lifestyle  . Physical activity    Days per week: Not on file    Minutes per session: Not on file  . Stress: Not on file  Relationships  . Social Herbalist on phone: Not on file    Gets together: Not on file    Attends religious service: Not on file    Active member of club or organization: Not on file    Attends meetings of clubs or organizations: Not on file    Relationship status: Not on file  . Intimate partner violence    Fear of current or ex partner: Not on file    Emotionally abused: Not on file    Physically abused: Not on file    Forced sexual activity: Not on file  Other Topics Concern  . Not on file  Social History Narrative  . Not on file      ROS:  General: Negative for anorexia, weight loss, fever, chills, fatigue, weakness. Eyes: Negative for vision changes.  ENT: Negative for hoarseness, difficulty swallowing , nasal congestion. CV: Negative for chest pain, angina, palpitations, dyspnea on exertion, peripheral edema.  Respiratory: Negative for dyspnea at rest, dyspnea on exertion, cough, sputum, wheezing.  GI: See history of present illness. GU:  Negative for dysuria, hematuria, urinary incontinence, urinary frequency, nocturnal urination.  MS: Positive for joint pain  Derm: Negative for rash or itching.  Neuro: Negative for weakness, abnormal sensation, seizure, frequent headaches, memory loss, confusion.  Psych: Negative for anxiety, depression, suicidal ideation, hallucinations.  Endo: Negative for unusual weight change.  Heme: Negative for bruising or bleeding. Allergy: Negative for rash or hives.    Physical  Examination:  BP 127/84   Pulse 90   Temp (!) 97 F (36.1 C) (Oral)   Ht 4\' 9"  (1.448 m)   Wt 123 lb 12.8 oz (56.2 kg)   LMP 02/10/1993   BMI 26.79 kg/m    General: Well-nourished, well-developed in no acute distress.  Head: Normocephalic, atraumatic.   Eyes: Conjunctiva pink, no icterus. Mouth: Oropharyngeal mucosa moist and pink , no lesions erythema or exudate. Neck: Supple without thyromegaly, masses, or lymphadenopathy.  Lungs: Clear to auscultation bilaterally.  Heart: Regular rate and rhythm, no murmurs rubs or gallops.  Abdomen: Bowel sounds are normal, nontender, nondistended, no hepatosplenomegaly or masses, no abdominal bruits or hernia , no rebound or guarding.   Rectal: Not performed Extremities: No lower extremity edema. No clubbing or deformities.  Neuro: Alert and oriented x 4 , grossly normal neurologically.  Skin: Warm and dry, no rash or jaundice.   Psych: Alert and cooperative, normal mood and affect.

## 2019-08-19 ENCOUNTER — Other Ambulatory Visit: Payer: Self-pay

## 2019-08-19 ENCOUNTER — Telehealth: Payer: Self-pay

## 2019-08-19 DIAGNOSIS — K219 Gastro-esophageal reflux disease without esophagitis: Secondary | ICD-10-CM

## 2019-08-19 DIAGNOSIS — Z86010 Personal history of colonic polyps: Secondary | ICD-10-CM

## 2019-08-19 DIAGNOSIS — R1013 Epigastric pain: Secondary | ICD-10-CM

## 2019-08-19 MED ORDER — PEG 3350-KCL-NA BICARB-NACL 420 G PO SOLR: 4000.0000 mL | ORAL | 0 refills | Status: DC

## 2019-08-19 NOTE — Telephone Encounter (Signed)
Called pt, TCS/EGD w/Propofol w/RMR scheduled for 11/23/19 at 10:00am. Orders entered. Rx for prep sent to pharmacy.

## 2019-08-21 ENCOUNTER — Ambulatory Visit (INDEPENDENT_AMBULATORY_CARE_PROVIDER_SITE_OTHER): Payer: Medicare Other | Admitting: Certified Nurse Midwife

## 2019-08-21 ENCOUNTER — Encounter: Payer: Self-pay | Admitting: Certified Nurse Midwife

## 2019-08-21 ENCOUNTER — Other Ambulatory Visit: Payer: Self-pay

## 2019-08-21 VITALS — BP 118/60 | HR 64 | Temp 97.2°F | Resp 16 | Ht <= 58 in | Wt 123.0 lb

## 2019-08-21 DIAGNOSIS — Z01419 Encounter for gynecological examination (general) (routine) without abnormal findings: Secondary | ICD-10-CM | POA: Diagnosis not present

## 2019-08-21 DIAGNOSIS — Z79890 Hormone replacement therapy (postmenopausal): Secondary | ICD-10-CM

## 2019-08-21 DIAGNOSIS — R29890 Loss of height: Secondary | ICD-10-CM

## 2019-08-21 DIAGNOSIS — M8580 Other specified disorders of bone density and structure, unspecified site: Secondary | ICD-10-CM

## 2019-08-21 DIAGNOSIS — N951 Menopausal and female climacteric states: Secondary | ICD-10-CM

## 2019-08-21 MED ORDER — ESTRADIOL 1 MG PO TABS: ORAL_TABLET | ORAL | 0 refills | Status: DC

## 2019-08-21 NOTE — Progress Notes (Signed)
78 y.o. G40P2002 Widowed  Caucasian Fe here for annual exam. Post menopausal on ERT for bone health. denies vaginal bleeding,some vaginal dryness with coconut oil use, with good result. Emotionally doing well during the Covid changes. Sees cardiology as needed. PCP management of medications all stable. No change. Still driving close distance with no issues. Plans to schedule mammogram. No other health issues today.  Patient's last menstrual period was 02/10/1993.          Sexually active: No.  The current method of family planning is status post hysterectomy.    Exercising: No.  exercise Smoker:  no  Review of Systems  Constitutional: Negative.   HENT: Negative.   Eyes: Negative.   Respiratory: Negative.   Cardiovascular: Negative.   Gastrointestinal: Negative.   Genitourinary: Negative.   Musculoskeletal: Negative.   Skin: Negative.   Neurological: Negative.   Endo/Heme/Allergies: Negative.   Psychiatric/Behavioral: Negative.     Health Maintenance: Pap:  07-06-02 neg History of Abnormal Pap: no MMG:  08-25-18 category b density birads 2:neg Self Breast exams: occ Colonoscopy:  2015 f/u 70yrs BMD:   2018 rheumatologist follows TDaP:  2014 Shingles: had done Pneumonia: 2018 Hep C and HIV: hep c neg per patient Labs: if needed    reports that she has never smoked. She has never used smokeless tobacco. She reports current alcohol use of about 7.0 standard drinks of alcohol per week. She reports that she does not use drugs.  Past Medical History:  Diagnosis Date  . Arthritis   . Bipolar affective disorder ([place])   . Cataract   . Chronic back pain   . Chronic fatigue   . Dizziness   . Dyslipidemia   . Fibromyalgia   . GERD (gastroesophageal reflux disease)   . H/O: hysterectomy   . Hx of adenomatous colonic polyps 2005  . Hyperlipidemia   . Hypertension   . Knee pain   . Non-obstructive CAD    a. 08/2015 Cath: LM 40, LCX small, 30ost, 50m, OM2 small, RCA nl/small, EF  55-65%.  . Osteopenia   . Sciatica     Past Surgical History:  Procedure Laterality Date  . ABDOMINAL HYSTERECTOMY  1994   TAH- DUB, BSO  . [place] bunionectomy     bilateral  . BREAST BIOPSY Left 1989  . BREAST LUMPECTOMY    . BUNIONECTOMY Left 2005  . CARDIAC CATHETERIZATION  2010   +CAD  . CARDIAC CATHETERIZATION N/A 08/19/2015   Procedure: Left Heart Cath and Coronary Angiography;  Surgeon: Belva Crome, MD;  Location: [facility];  Service: Cardiovascular;  Laterality: N/A;  . 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  Remainder of colonic mucosa was normal  . COLONOSCOPY WITH ESOPHAGOGASTRODUODENOSCOPY (EGD) N/A 02/01/2014   [id number] erosive reflux/gastric polyp/normal rectum/scattered left sided diverticula, normal distal TI. gastric bx negative, fundic gland polyp. next TCS 01/2019.  Marland Kitchen CYSTECTOMY     pilonidial cyst  . ESOPHAGOGASTRODUODENOSCOPY     followed by colonoscopy with snare polypectomy  . right knee replacement  02/2013  . SHOULDER ARTHROSCOPY Left 2005  . SHOULDER ARTHROSCOPY Right   . SPINAL FUSION    . SPINAL FUSION  2004   L4-L5  . TOTAL HIP ARTHROPLASTY Right 06/2006  . TOTAL HIP ARTHROPLASTY  8/12  . TOTAL SHOULDER REPLACEMENT Left 08/2005    Current Outpatient Medications  Medication Sig Dispense Refill  . ALPRAZolam (XANAX) 0.5 MG tablet Take  1 tablet by mouth daily as needed. anxiety    . aspirin 81 MG tablet Take 81 mg by mouth daily.    Marland Kitchen atorvastatin (LIPITOR) 40 MG tablet TAKE (1) TABLET BY MOUTH AT BEDTIME. 90 tablet 2  . Bisacodyl (WOMENS LAXATIVE PO) Take by mouth as needed.    Marland Kitchen CALCIUM PO Take by mouth daily.    . Cholecalciferol (VITAMIN D PO) Take 2,000 Int'l Units by mouth daily.    . Cimetidine (TAGAMET PO) Take by mouth daily.    . DULoxetine (CYMBALTA) 60 MG capsule Take 120 mg by mouth daily.     Marland Kitchen estradiol (ESTRACE) 1 MG tablet Take 1/2  tablet po daily 30 tablet 6  . lamoTRIgine (LAMICTAL) 150 MG tablet Take 150 mg by mouth at bedtime.    Marland Kitchen levothyroxine (LEVOTHROID) 25 MCG tablet Take 1 tablet (25 mcg total) by mouth daily. 30 tablet 12  . losartan (COZAAR) 50 MG tablet TAKE (1) TABLET BY MOUTH ONCE DAILY. 90 tablet 1  . Magnesium Hydroxide (MILK OF MAGNESIA PO) Take by mouth as needed.    . meloxicam (MOBIC) 7.5 MG tablet Take 7.5 mg by mouth 2 (two) times daily.     . Multiple Vitamin (MULTIVITAMIN) tablet Take 1 tablet by mouth daily.      . niacin (SLO-NIACIN) 500 MG tablet Take 500 mg by mouth daily.     . Omega-3 Fatty Acids (FISH OIL) 1000 MG CAPS Take 1 capsule by mouth daily.     . Probiotic Product (PROBIOTIC DAILY PO) Take 1 tablet by mouth every morning. Florify    . RESTASIS 0.05 % ophthalmic emulsion Place 1 drop into both eyes 2 (two) times daily.     . risperiDONE (RISPERDAL) 1 MG tablet Take 0.5 mg by mouth at bedtime.     . WELLBUTRIN XL 300 MG 24 hr tablet Take 300 mg by mouth daily.     . nitroGLYCERIN (NITROSTAT) 0.4 MG SL tablet PLACE 1 TAB UNDER TONGUE EVERY 5 MIN IF NEEDED FOR CHEST PAIN. MAY USE 3 TIMES.NO RELIEF CALL 911. (Patient not taking: Reported on 08/21/2019) 25 tablet 3   No current facility-administered medications for this visit.     Family History  Problem Relation Age of Onset  . Heart disease Mother   . Heart attack Mother   . Heart disease Father   . Heart attack Father   . Stroke Sister   . Stroke Maternal Grandfather   . Cancer Paternal Grandmother        unknown primary  . Colon cancer Neg Hx     ROS:  Pertinent items are noted in HPI.  Otherwise, a comprehensive ROS was negative.  Exam:   BP 118/60   Pulse 64   Temp (!) 97.2 F (36.2 C) (Skin)   Resp 16   Ht 4' 7.75" (1.416 m)   Wt 123 lb (55.8 kg)   LMP 02/10/1993   BMI 27.82 kg/m  Height: 4' 7.75" (141.6 cm) Ht Readings from Last 3 Encounters:  08/21/19 4' 7.75" (1.416 m)  08/14/19 4\' 9"  (1.448 m)  08/19/18  4' 7.75" (1.416 m)    General appearance: alert, cooperative and appears stated age Head: Normocephalic, without obvious abnormality, atraumatic Neck: no adenopathy, supple, symmetrical, trachea midline and thyroid normal to inspection and palpation Lungs: clear to auscultation bilaterally Breasts: normal appearance, no masses or tenderness, No nipple retraction or dimpling, No nipple discharge or bleeding, No axillary or supraclavicular adenopathy Heart: regular  rate and rhythm Abdomen: soft, non-tender; no masses,  no organomegaly Extremities: extremities normal, atraumatic, no cyanosis or edema Skin: Skin color, texture, turgor normal. No rashes or lesions Lymph nodes: Cervical, supraclavicular, and axillary nodes normal. No abnormal inguinal nodes palpated Neurologic: Grossly normal   Pelvic: External genitalia:  no lesions              Urethra:  normal appearing urethra with no masses, tenderness or lesions              Bartholin's and Skene's: normal                 Vagina: normal appearing vagina with normal color and discharge, no lesions              Cervix: absent              Pap taken: No. Bimanual Exam:  Uterus:  uterus absent              Adnexa: no mass, fullness, tenderness, adnexa surgically removed               Rectovaginal: Confirms               Anus:  normal sphincter tone, no lesions  Chaperone present: yes  A:  Well Woman with normal exam  Post menopausal on ERT for bone health, s/p TAH with BSO.  No height change this year  PCP management of cholesterol, hypothyroid, hypertension, bipolar, vitamin D, all stable per patient       P:   Reviewed health and wellness pertinent to exam  Discussed risks/benfits/warning signs with ERT use. Patient aware of risks and increase concern with breast cancer, but feels well being and bone support are greater benefits.Patient aware to call if any concerns.  Rx Estrace 1 mg 1/2 half tablet daily. See order with  instructions.  Continue with PCP management of other health problems as indicated.  Pap smear: no  counseled on breast self exam, mammography screening, feminine hygiene, use and side effects of HRT, menopause, osteoporosis, adequate intake of calcium and vitamin D, diet and exercise return annually or prn  An After Visit Summary was printed and given to the patient.

## 2019-08-21 NOTE — Patient Instructions (Signed)
EXERCISE AND DIET:  We recommended that you start or continue a regular exercise program for good health. Regular exercise means any activity that makes your heart beat faster and makes you sweat.  We recommend exercising at least 30 minutes per day at least 3 days a week, preferably 4 or 5.  We also recommend a diet low in fat and sugar.  Inactivity, poor dietary choices and obesity can cause diabetes, heart attack, stroke, and kidney damage, among others.   ° °ALCOHOL AND SMOKING:  Women should limit their alcohol intake to no more than 7 drinks/beers/glasses of wine (combined, not each!) per week. Moderation of alcohol intake to this level decreases your risk of breast cancer and liver damage. And of course, no recreational drugs are part of a healthy lifestyle.  And absolutely no smoking or even second hand smoke. Most people know smoking can cause heart and lung diseases, but did you know it also contributes to weakening of your bones? Aging of your skin?  Yellowing of your teeth and nails? ° °CALCIUM AND VITAMIN D:  Adequate intake of calcium and Vitamin D are recommended.  The recommendations for exact amounts of these supplements seem to change often, but generally speaking 600 mg of calcium (either carbonate or citrate) and 800 units of Vitamin D per day seems prudent. Certain women may benefit from higher intake of Vitamin D.  If you are among these women, your doctor will have told you during your visit.   ° °PAP SMEARS:  Pap smears, to check for cervical cancer or precancers,  have traditionally been done yearly, although recent scientific advances have shown that most women can have pap smears less often.  However, every woman still should have a physical exam from her gynecologist every year. It will include a breast check, inspection of the vulva and vagina to check for abnormal growths or skin changes, a visual exam of the cervix, and then an exam to evaluate the size and shape of the uterus and  ovaries.  And after 76 years of age, a rectal exam is indicated to check for rectal cancers. We will also provide age appropriate advice regarding health maintenance, like when you should have certain vaccines, screening for sexually transmitted diseases, bone density testing, colonoscopy, mammograms, etc.  ° °MAMMOGRAMS:  All women over 40 years old should have a yearly mammogram. Many facilities now offer a "3D" mammogram, which may cost around $50 extra out of pocket. If possible,  we recommend you accept the option to have the 3D mammogram performed.  It both reduces the number of women who will be called back for extra views which then turn out to be normal, and it is better than the routine mammogram at detecting truly abnormal areas.   ° °COLONOSCOPY:  Colonoscopy to screen for colon cancer is recommended for all women at age 50.  We know, you hate the idea of the prep.  We agree, BUT, having colon cancer and not knowing it is worse!!  Colon cancer so often starts as a polyp that can be seen and removed at colonscopy, which can quite literally save your life!  And if your first colonoscopy is normal and you have no family history of colon cancer, most women don't have to have it again for 10 years.  Once every ten years, you can do something that may end up saving your life, right?  We will be happy to help you get it scheduled when you are ready.    Be sure to check your insurance coverage so you understand how much it will cost.  It may be covered as a preventative service at no cost, but you should check your particular policy.      Menopause and Hormone Replacement Therapy Menopause is a normal time of life when menstrual periods stop completely and the ovaries stop producing the female hormones estrogen and progesterone. This lack of hormones can affect your health and cause undesirable symptoms. Hormone replacement therapy (HRT) can relieve some of those symptoms. What is hormone replacement  therapy? HRT is the use of artificial (synthetic) hormones to replace hormones that your body has stopped producing because you have reached menopause. What are my options for HRT?  HRT may consist of the synthetic hormones estrogen and progestin, or it may consist of only estrogen (estrogen-only therapy). You and your health care provider will decide which form of HRT is best for you. If you choose to be on HRT and you have a uterus, estrogen and progestin are usually prescribed. Estrogen-only therapy is used for women who do not have a uterus. Possible options for taking HRT include:  Pills.  Patches.  Gels.  Sprays.  Vaginal cream.  Vaginal rings.  Vaginal inserts. The amount of hormone(s) that you take and how long you take the hormone(s) varies according to your health. It is important to:  Begin HRT with the lowest possible dosage.  Stop HRT as soon as your health care provider tells you to stop.  Work with your health care provider so that you feel informed and comfortable with your decisions. What are the benefits of HRT? HRT can reduce the frequency and severity of menopausal symptoms. Benefits of HRT vary according to the kind of symptoms that you have, how severe they are, and your overall health. HRT may help to improve the following symptoms of menopause:  Hot flashes and night sweats. These are sudden feelings of heat that spread over the face and body. The skin may turn red, like a blush. Night sweats are hot flashes that happen while you are sleeping or trying to sleep.  Bone loss (osteoporosis). The body loses calcium more quickly after menopause, causing the bones to become weaker. This can increase the risk for bone breaks (fractures).  Vaginal dryness. The lining of the vagina can become thin and dry, which can cause pain during sex or cause infection, burning, or itching.  Urinary tract infections.  Urinary incontinence. This is the inability to control  when you pass urine.  Irritability.  Short-term memory problems. What are the risks of HRT? Risks of HRT vary depending on your individual health and medical history. Risks of HRT also depend on whether you receive both estrogen and progestin or you receive estrogen only. HRT may increase the risk of:  Spotting. This is when a small amount of blood leaks from the vagina unexpectedly.  Endometrial cancer. This cancer is in the lining of the uterus (endometrium).  Breast cancer.  Increased density of breast tissue. This can make it harder to find breast cancer on a breast X-ray (mammogram).  Stroke.  Heart disease.  Blood clots.  Gallbladder disease.  Liver disease. Risks of HRT can increase if you have any of the following conditions:  Endometrial cancer.  Liver disease.  Heart disease.  Breast cancer.  History of blood clots.  History of stroke. Follow these instructions at home:  Take over-the-counter and prescription medicines only as told by your health care provider.  Get mammograms, pelvic exams, and medical checkups as often as told by your health care provider.  Have Pap tests done as often as told by your health care provider. A Pap test is sometimes called a Pap smear. It is a screening test that is used to check for signs of cancer of the cervix and vagina. A Pap test can also identify the presence of infection or precancerous changes. Pap tests may be done: ? Every 3 years, starting at age 55. ? Every 5 years, starting after age 39, in combination with testing for human papillomavirus (HPV). ? More often or less often depending on other medical conditions you have, your age, and other risk factors.  It is up to you to get the results of your Pap test. Ask your health care provider, or the department that is doing the test, when your results will be ready.  Keep all follow-up visits as told by your health care provider. This is important. Contact a health  care provider if you have:  Pain or swelling in your legs.  Shortness of breath.  Chest pain.  Lumps or changes in your breasts or armpits.  Slurred speech.  Pain, burning, or bleeding when you urinate.  Unusual vaginal bleeding.  Dizziness or headaches.  Weakness or numbness in any part of your arms or legs.  Pain in your abdomen. Summary  Menopause is a normal time of life when menstrual periods stop completely and the ovaries stop producing the female hormones estrogen and progesterone.  Hormone replacement therapy (HRT) can relieve some of the symptoms of menopause.  HRT can reduce the frequency and severity of menopausal symptoms.  Risks of HRT vary depending on your individual health and medical history. This information is not intended to replace advice given to you by your health care provider. Make sure you discuss any questions you have with your health care provider. Document Released: 07/28/2003 Document Revised: 07/01/2018 Document Reviewed: 07/01/2018 Elsevier Patient Education  2020 [employer].

## 2019-08-24 NOTE — Telephone Encounter (Signed)
Pre-op appt 11/20/19 at 12:45pm, COVID test 1:45pm. Appt letter mailed with procedure instructions.

## 2019-08-31 ENCOUNTER — Encounter: Payer: Self-pay | Admitting: Certified Nurse Midwife

## 2019-08-31 LAB — HM MAMMOGRAPHY

## 2019-11-12 ENCOUNTER — Other Ambulatory Visit: Payer: Self-pay | Admitting: Certified Nurse Midwife

## 2019-11-12 DIAGNOSIS — M8580 Other specified disorders of bone density and structure, unspecified site: Secondary | ICD-10-CM

## 2019-11-12 DIAGNOSIS — N951 Menopausal and female climacteric states: Secondary | ICD-10-CM

## 2019-11-12 NOTE — Telephone Encounter (Signed)
Medication refill request: Estradiol Last AEX:  08/21/19 DL Next AEX: 08/23/20 Last MMG (if hormonal medication request): 08/31/19 BIRADS 1 negative/density c Refill authorized: Please advise on refill.

## 2019-11-16 ENCOUNTER — Other Ambulatory Visit: Payer: Self-pay | Admitting: Internal Medicine

## 2019-11-17 NOTE — Telephone Encounter (Signed)
This is a [place] pt.  °

## 2019-11-18 ENCOUNTER — Telehealth: Payer: Self-pay

## 2019-11-18 NOTE — Telephone Encounter (Signed)
Pt now has [employer].   PA for TCS submitted via [employer] website. Case went to clinical review. [place] tracking# [id number]. Clincal notes faxed to [employer].  PA for EGD submitted via [employer] website. Case went to clinical review. [place] tracking# [id number]. Clinical notes faxed to [facility].

## 2019-11-18 NOTE — Telephone Encounter (Signed)
Received fax from pharmacy, [facility] on back order. Called pt and informed her prep will be changed to Miralax. New instructions emailed to pt at [email or web address]

## 2019-11-19 ENCOUNTER — Telehealth: Payer: Self-pay | Admitting: Internal Medicine

## 2019-11-19 ENCOUNTER — Other Ambulatory Visit: Payer: Self-pay

## 2019-11-19 NOTE — Telephone Encounter (Signed)
TCS approved. [employer]# [id number], valid 11/23/19-12/23/19.

## 2019-11-19 NOTE — Telephone Encounter (Signed)
Noted. Reasonable.

## 2019-11-19 NOTE — Telephone Encounter (Addendum)
Called patient. She did not want to r/s right now. She wants to get the covid vaccine and finish the series before she does anything. Patient will call back to r/s when she is ready. FYI to LSL

## 2019-11-19 NOTE — Telephone Encounter (Signed)
Patient called and said her pre-op is tomorrow and she is not going to drive in the snow, so she wants to postpone her procedure

## 2019-11-19 NOTE — Patient Instructions (Signed)
[name] [name]  11/19/2019     @PREFPERIOPPHARMACY @   Your procedure is scheduled on Monday, 11/23/19.  Report to [facility] at 0800 A.M.  Call this number if you have problems the morning of surgery:  [phone]   Remember:  Do not eat or drink after midnight.      Take these medicines the morning of surgery with A SIP OF WATER xanax, cymbalta, levothyroxine, cozaar, and wellbutrin    Do not wear jewelry, make-up or nail polish.  Do not wear lotions, powders, or perfumes, or deodorant.  Do not shave 48 hours prior to surgery.  Men may shave face and neck.  Do not bring valuables to the hospital.  [facility] is not responsible for any belongings or valuables.  Contacts, dentures or bridgework may not be worn into surgery.  Leave your suitcase in the car.  After surgery it may be brought to your room.  For patients admitted to the hospital, discharge time will be determined by your treatment team.  Patients discharged the day of surgery will not be allowed to drive home.   Name and phone number of your driver:   family Special instructions:  Please follow diet and bowel prep instructions given to you by the office.  Please read over the following fact sheets that you were given. Anesthesia Post-op Instructions and Care and Recovery After Surgery       Colonoscopy, Adult A colonoscopy is a procedure to look at the entire large intestine. This procedure is done using a long, thin, flexible tube that has a camera on the end. You may have a colonoscopy:  As a part of normal colorectal screening.  If you have certain symptoms, such as: ? A low number of red blood cells in your blood (anemia). ? Diarrhea that does not go away. ? Pain in your abdomen. ? Blood in your stool. A colonoscopy can help screen for and diagnose medical problems, including:  Tumors.  Extra tissue that grows where mucus forms (polyps).  Inflammation.  Areas of bleeding. Tell your health  care provider about:  Any allergies you have.  All medicines you are taking, including vitamins, herbs, eye drops, creams, and over-the-counter medicines.  Any problems you or family members have had with anesthetic medicines.  Any blood disorders you have.  Any surgeries you have had.  Any medical conditions you have.  Any problems you have had with having bowel movements.  Whether you are pregnant or may be pregnant. What are the risks? Generally, this is a safe procedure. However, problems may occur, including:  Bleeding.  Damage to your intestine.  Allergic reactions to medicines given during the procedure.  Infection. This is rare. What happens before the procedure? Eating and drinking restrictions Follow instructions from your health care provider about eating or drinking restrictions, which may include:  A few days before the procedure: ? Follow a low-fiber diet. ? Avoid nuts, seeds, dried fruit, raw fruits, and vegetables.  1-3 days before the procedure: ? Eat only gelatin dessert or ice pops. ? Drink only clear liquids, such as water, clear juice, clear broth or bouillon, black coffee or tea, or clear soft drinks or sports drinks. ? Avoid liquids that contain red or purple dye.  The day of the procedure: ? Do not eat solid foods. You may continue to drink clear liquids until up to 2 hours before the procedure. ? Do not eat or drink anything starting 2 hours before the  procedure, or within the time period that your health care provider recommends. Bowel prep If you were prescribed a bowel prep to take by mouth (orally) to clean out your colon:  Take it as told by your health care provider. Starting the day before your procedure, you will need to drink a large amount of liquid medicine. The liquid will cause you to have many bowel movements of loose stool until your stool becomes almost clear or light green.  If your skin or the opening between the buttocks  (anus) gets irritated from diarrhea, you may relieve the irritation using: ? Wipes with medicine in them, such as adult wet wipes with aloe and vitamin E. ? A product to soothe skin, such as petroleum jelly.  If you vomit while drinking the bowel prep: ? Take a break for up to 60 minutes. ? Begin the bowel prep again. ? Call your health care provider if you keep vomiting or you cannot take the bowel prep without vomiting.  To clean out your colon, you may also be given: ? Laxative medicines. These help you have a bowel movement. ? Instructions for enema use. An enema is liquid medicine injected into your rectum. Medicines Ask your health care provider about:  Changing or stopping your regular medicines or supplements. This is especially important if you are taking iron supplements, diabetes medicines, or blood thinners.  Taking medicines such as aspirin and ibuprofen. These medicines can thin your blood. Do not take these medicines unless your health care provider tells you to take them.  Taking over-the-counter medicines, vitamins, herbs, and supplements. General instructions  Ask your health care provider what steps will be taken to help prevent infection. These may include washing skin with a germ-killing soap.  Plan to have someone take you home from the hospital or clinic. What happens during the procedure?   An IV will be inserted into one of your veins.  You may be given one or more of the following: ? A medicine to help you relax (sedative). ? A medicine to numb the area (local anesthetic). ? A medicine to make you fall asleep (general anesthetic). This is rarely needed.  You will lie on your side with your knees bent.  The tube will: ? Have oil or gel put on it (be lubricated). ? Be inserted into your anus. ? Be gently eased through all parts of your large intestine.  Air will be sent into your colon to keep it open. This may cause some pressure or  cramping.  Images will be taken with the camera and will appear on a screen.  A small tissue sample may be removed to be looked at under a microscope (biopsy). The tissue may be sent to a lab for testing if any signs of problems are found.  If small polyps are found, they may be removed and checked for cancer cells.  When the procedure is finished, the tube will be removed. The procedure may vary among health care providers and hospitals. What happens after the procedure?  Your blood pressure, heart rate, breathing rate, and blood oxygen level will be monitored until you leave the hospital or clinic.  You may have a small amount of blood in your stool.  You may pass gas and have mild cramping or bloating in your abdomen. This is caused by the air that was used to open your colon during the exam.  Do not drive for 24 hours after the procedure.  It is up  to you to get the results of your procedure. Ask your health care provider, or the department that is doing the procedure, when your results will be ready. Summary  A colonoscopy is a procedure to look at the entire large intestine.  Follow instructions from your health care provider about eating and drinking before the procedure.  If you were prescribed an oral bowel prep to clean out your colon, take it as told by your health care provider.  During the colonoscopy, a flexible tube with a camera on its end is inserted into the anus and then passed into the other parts of the large intestine. This information is not intended to replace advice given to you by your health care provider. Make sure you discuss any questions you have with your health care provider. Document Revised: 05/22/2019 Document Reviewed: 05/22/2019 Elsevier Patient Education  [place]. Colonoscopy, Adult, Care After This sheet gives you information about how to care for yourself after your procedure. Your health care provider may also give you more  specific instructions. If you have problems or questions, contact your health care provider. What can I expect after the procedure? After the procedure, it is common to have:  A small amount of blood in your stool for 24 hours after the procedure.  Some gas.  Mild cramping or bloating of your abdomen. Follow these instructions at home: Eating and drinking   Drink enough fluid to keep your urine pale yellow.  Follow instructions from your health care provider about eating or drinking restrictions.  Resume your normal diet as instructed by your health care provider. Avoid heavy or fried foods that are hard to digest. Activity  Rest as told by your health care provider.  Avoid sitting for a long time without moving. Get up to take short walks every 1-2 hours. This is important to improve blood flow and breathing. Ask for help if you feel weak or unsteady.  Return to your normal activities as told by your health care provider. Ask your health care provider what activities are safe for you. Managing cramping and bloating   Try walking around when you have cramps or feel bloated.  Apply heat to your abdomen as told by your health care provider. Use the heat source that your health care provider recommends, such as a moist heat pack or a heating pad. ? Place a towel between your skin and the heat source. ? Leave the heat on for 20-30 minutes. ? Remove the heat if your skin turns bright red. This is especially important if you are unable to feel pain, heat, or cold. You may have a greater risk of getting burned. General instructions  For the first 24 hours after the procedure: ? Do not drive or use machinery. ? Do not sign important documents. ? Do not drink alcohol. ? Do your regular daily activities at a slower pace than normal. ? Eat soft foods that are easy to digest.  Take over-the-counter and prescription medicines only as told by your health care provider.  Keep all  follow-up visits as told by your health care provider. This is important. Contact a health care provider if:  You have blood in your stool 2-3 days after the procedure. Get help right away if you have:  More than a small spotting of blood in your stool.  Large blood clots in your stool.  Swelling of your abdomen.  Nausea or vomiting.  A fever.  Increasing pain in your abdomen that  is not relieved with medicine. Summary  After the procedure, it is common to have a small amount of blood in your stool. You may also have mild cramping and bloating of your abdomen.  For the first 24 hours after the procedure, do not drive or use machinery, sign important documents, or drink alcohol.  Get help right away if you have a lot of blood in your stool, nausea or vomiting, a fever, or increased pain in your abdomen. This information is not intended to replace advice given to you by your health care provider. Make sure you discuss any questions you have with your health care provider. Document Revised: 05/25/2019 Document Reviewed: 05/25/2019 Elsevier Patient Education  [place]. Upper Endoscopy, Adult, Care After This sheet gives you information about how to care for yourself after your procedure. Your health care provider may also give you more specific instructions. If you have problems or questions, contact your health care provider. What can I expect after the procedure? After the procedure, it is common to have:  A sore throat.  Mild stomach pain or discomfort.  Bloating.  Nausea. Follow these instructions at home:   Follow instructions from your health care provider about what to eat or drink after your procedure.  Return to your normal activities as told by your health care provider. Ask your health care provider what activities are safe for you.  Take over-the-counter and prescription medicines only as told by your health care provider.  Do not drive for 24 hours  if you were given a sedative during your procedure.  Keep all follow-up visits as told by your health care provider. This is important. Contact a health care provider if you have:  A sore throat that lasts longer than one day.  Trouble swallowing. Get help right away if:  You vomit blood or your vomit looks like coffee grounds.  You have: ? A fever. ? Bloody, black, or tarry stools. ? A severe sore throat or you cannot swallow. ? Difficulty breathing. ? Severe pain in your chest or abdomen. Summary  After the procedure, it is common to have a sore throat, mild stomach discomfort, bloating, and nausea.  Do not drive for 24 hours if you were given a sedative during the procedure.  Follow instructions from your health care provider about what to eat or drink after your procedure.  Return to your normal activities as told by your health care provider. This information is not intended to replace advice given to you by your health care provider. Make sure you discuss any questions you have with your health care provider. Document Revised: 04/22/2018 Document Reviewed: 03/31/2018 Elsevier Patient Education  [place].

## 2019-11-20 ENCOUNTER — Encounter (HOSPITAL_COMMUNITY)
Admission: RE | Admit: 2019-11-20 | Discharge: 2019-11-20 | Disposition: A | Discharge status: Home / Self Care | Payer: Medicare PPO | Source: Ambulatory Visit | Attending: Internal Medicine | Admitting: Internal Medicine

## 2019-11-20 ENCOUNTER — Other Ambulatory Visit (HOSPITAL_COMMUNITY)
Admission: RE | Admit: 2019-11-20 | Discharge: 2019-11-20 | Disposition: A | Discharge status: Home / Self Care | Payer: Medicare PPO | Source: Ambulatory Visit | Attending: Internal Medicine | Admitting: Internal Medicine

## 2019-11-20 ENCOUNTER — Other Ambulatory Visit: Payer: Self-pay

## 2019-11-23 ENCOUNTER — Encounter (HOSPITAL_COMMUNITY): Admission: RE | Payer: Self-pay | Source: Home / Self Care

## 2019-11-23 ENCOUNTER — Ambulatory Visit (HOSPITAL_COMMUNITY): Admission: RE | Admit: 2019-11-23 | Payer: Medicare PPO | Source: Home / Self Care | Admitting: Internal Medicine

## 2019-11-23 SURGERY — COLONOSCOPY WITH PROPOFOL
Anesthesia: Monitor Anesthesia Care

## 2019-12-03 NOTE — Telephone Encounter (Signed)
Received fax from [facility], EGD approved. [employer]# [id number], valid 11/23/19-12/23/19. Pt has already cancelled procedure.

## 2019-12-08 ENCOUNTER — Other Ambulatory Visit: Payer: Self-pay | Admitting: Physician Assistant

## 2020-01-05 ENCOUNTER — Ambulatory Visit (INDEPENDENT_AMBULATORY_CARE_PROVIDER_SITE_OTHER): Payer: Medicare PPO | Admitting: Family Medicine

## 2020-01-05 ENCOUNTER — Other Ambulatory Visit: Payer: Self-pay

## 2020-01-05 ENCOUNTER — Encounter: Payer: Self-pay | Admitting: Family Medicine

## 2020-01-05 VITALS — BP 118/74 | HR 93 | Temp 97.8°F | Ht <= 58 in | Wt 121.0 lb

## 2020-01-05 DIAGNOSIS — M170 Bilateral primary osteoarthritis of knee: Secondary | ICD-10-CM | POA: Diagnosis not present

## 2020-01-05 DIAGNOSIS — E559 Vitamin D deficiency, unspecified: Secondary | ICD-10-CM | POA: Diagnosis not present

## 2020-01-05 DIAGNOSIS — E785 Hyperlipidemia, unspecified: Secondary | ICD-10-CM

## 2020-01-05 DIAGNOSIS — I10 Essential (primary) hypertension: Secondary | ICD-10-CM | POA: Diagnosis not present

## 2020-01-05 DIAGNOSIS — E039 Hypothyroidism, unspecified: Secondary | ICD-10-CM

## 2020-01-05 NOTE — Patient Instructions (Addendum)
High Cholesterol  High cholesterol is a condition in which the blood has high levels of a white, waxy, fat-like substance (cholesterol). The human body needs small amounts of cholesterol. The liver makes all the cholesterol that the body needs. Extra (excess) cholesterol comes from the food that we eat. Cholesterol is carried from the liver by the blood through the blood vessels. If you have high cholesterol, deposits (plaques) may build up on the walls of your blood vessels (arteries). Plaques make the arteries narrower and stiffer. Cholesterol plaques increase your risk for heart attack and stroke. Work with your health care provider to keep your cholesterol levels in a healthy range. What increases the risk? This condition is more likely to develop in people who:  Eat foods that are high in animal fat (saturated fat) or cholesterol.  Are overweight.  Are not getting enough exercise.  Have a family history of high cholesterol. What are the signs or symptoms? There are no symptoms of this condition. How is this diagnosed? This condition may be diagnosed from the results of a blood test.  If you are older than age 20, your health care provider may check your cholesterol every 4-6 years.  You may be checked more often if you already have high cholesterol or other risk factors for heart disease. The blood test for cholesterol measures:  "Bad" cholesterol (LDL cholesterol). This is the main type of cholesterol that causes heart disease. The desired level for LDL is less than 100.  "Good" cholesterol (HDL cholesterol). This type helps to protect against heart disease by cleaning the arteries and carrying the LDL away. The desired level for HDL is 60 or higher.  Triglycerides. These are fats that the body can store or burn for energy. The desired number for triglycerides is lower than 150.  Total cholesterol. This is a measure of the total amount of cholesterol in your blood, including LDL  cholesterol, HDL cholesterol, and triglycerides. A healthy number is less than 200. How is this treated? This condition is treated with diet changes, lifestyle changes, and medicines. Diet changes  This may include eating more whole grains, fruits, vegetables, nuts, and fish.  This may also include cutting back on red meat and foods that have a lot of added sugar. Lifestyle changes  Changes may include getting at least 40 minutes of aerobic exercise 3 times a week. Aerobic exercises include walking, biking, and swimming. Aerobic exercise along with a healthy diet can help you maintain a healthy weight.  Changes may also include quitting smoking. Medicines  Medicines are usually given if diet and lifestyle changes have failed to reduce your cholesterol to healthy levels.  Your health care provider may prescribe a statin medicine. Statin medicines have been shown to reduce cholesterol, which can reduce the risk of heart disease. Follow these instructions at home: Eating and drinking If told by your health care provider:  Eat chicken (without skin), fish, veal, shellfish, ground turkey breast, and round or loin cuts of red meat.  Do not eat fried foods or fatty meats, such as hot dogs and salami.  Eat plenty of fruits, such as apples.  Eat plenty of vegetables, such as broccoli, potatoes, and carrots.  Eat beans, peas, and lentils.  Eat grains such as barley, rice, couscous, and bulgur wheat.  Eat pasta without cream sauces.  Use skim or nonfat milk, and eat low-fat or nonfat yogurt and cheeses.  Do not eat or drink whole milk, cream, ice cream, egg yolks,   or hard cheeses.  Do not eat stick margarine or tub margarines that contain trans fats (also called partially hydrogenated oils).  Do not eat saturated tropical oils, such as coconut oil and palm oil.  Do not eat cakes, cookies, crackers, or other baked goods that contain trans fats.  General instructions  Exercise as  directed by your health care provider. Increase your activity level with activities such as gardening, walking, and taking the stairs.  Take over-the-counter and prescription medicines only as told by your health care provider.  Do not use any products that contain nicotine or tobacco, such as cigarettes and e-cigarettes. If you need help quitting, ask your health care provider.  Keep all follow-up visits as told by your health care provider. This is important. Contact a health care provider if:  You are struggling to maintain a healthy diet or weight.  You need help to start on an exercise program.  You need help to stop smoking. Get help right away if:  You have chest pain.  You have trouble breathing. This information is not intended to replace advice given to you by your health care provider. Make sure you discuss any questions you have with your health care provider. Document Revised: 11/01/2017 Document Reviewed: 04/28/2016 Elsevier Patient Education  [place]. Fasting labwork  Hypertension, Adult Hypertension is another name for high blood pressure. High blood pressure forces your heart to work harder to pump blood. This can cause problems over time. There are two numbers in a blood pressure reading. There is a top number (systolic) over a bottom number (diastolic). It is best to have a blood pressure that is below 120/80. Healthy choices can help lower your blood pressure, or you may need medicine to help lower it. What are the causes? The cause of this condition is not known. Some conditions may be related to high blood pressure. What increases the risk?  Smoking.  Having type 2 diabetes mellitus, high cholesterol, or both.  Not getting enough exercise or physical activity.  Being overweight.  Having too much fat, sugar, calories, or salt (sodium) in your diet.  Drinking too much alcohol.  Having long-term (chronic) kidney disease.  Having a family  history of high blood pressure.  Age. Risk increases with age.  Race. You may be at higher risk if you are African American.  Gender. Men are at higher risk than women before age 1. After age 61, women are at higher risk than men.  Having obstructive sleep apnea.  Stress. What are the signs or symptoms?  High blood pressure may not cause symptoms. Very high blood pressure (hypertensive crisis) may cause: ? Headache. ? Feelings of worry or nervousness (anxiety). ? Shortness of breath. ? Nosebleed. ? A feeling of being sick to your stomach (nausea). ? Throwing up (vomiting). ? Changes in how you see. ? Very bad chest pain. ? Seizures. How is this treated?  This condition is treated by making healthy lifestyle changes, such as: ? Eating healthy foods. ? Exercising more. ? Drinking less alcohol.  Your health care provider may prescribe medicine if lifestyle changes are not enough to get your blood pressure under control, and if: ? Your top number is above 130. ? Your bottom number is above 80.  Your personal target blood pressure may vary. Follow these instructions at home: Eating and drinking   If told, follow the DASH eating plan. To follow this plan: ? Fill one half of your plate at each meal  with fruits and vegetables. ? Fill one fourth of your plate at each meal with whole grains. Whole grains include whole-wheat pasta, brown rice, and whole-grain bread. ? Eat or drink low-fat dairy products, such as skim milk or low-fat yogurt. ? Fill one fourth of your plate at each meal with low-fat (lean) proteins. Low-fat proteins include fish, chicken without skin, eggs, beans, and tofu. ? Avoid fatty meat, cured and processed meat, or chicken with skin. ? Avoid pre-made or processed food.  Eat less than 1,500 mg of salt each day.  Do not drink alcohol if: ? Your doctor tells you not to drink. ? You are pregnant, may be pregnant, or are planning to become pregnant.  If you  drink alcohol: ? Limit how much you use to:  0-1 drink a day for women.  0-2 drinks a day for men. ? Be aware of how much alcohol is in your drink. In the [place] one drink equals one 12 oz bottle of beer (355 mL), one 5 oz glass of wine (148 mL), or one 1 oz glass of hard liquor (44 mL). Lifestyle   Work with your doctor to stay at a healthy weight or to lose weight. Ask your doctor what the best weight is for you.  Get at least 30 minutes of exercise most days of the week. This may include walking, swimming, or biking.  Get at least 30 minutes of exercise that strengthens your muscles (resistance exercise) at least 3 days a week. This may include lifting weights or doing Pilates.  Do not use any products that contain nicotine or tobacco, such as cigarettes, e-cigarettes, and chewing tobacco. If you need help quitting, ask your doctor.  Check your blood pressure at home as told by your doctor.  Keep all follow-up visits as told by your doctor. This is important. Medicines  Take over-the-counter and prescription medicines only as told by your doctor. Follow directions carefully.  Do not skip doses of blood pressure medicine. The medicine does not work as well if you skip doses. Skipping doses also puts you at risk for problems.  Ask your doctor about side effects or reactions to medicines that you should watch for. Contact a doctor if you:  Think you are having a reaction to the medicine you are taking.  Have headaches that keep coming back (recurring).  Feel dizzy.  Have swelling in your ankles.  Have trouble with your vision. Get help right away if you:  Get a very bad headache.  Start to feel mixed up (confused).  Feel weak or numb.  Feel faint.  Have very bad pain in your: ? Chest. ? Belly (abdomen).  Throw up more than once.  Have trouble breathing. Summary  Hypertension is another name for high blood pressure.  High blood pressure forces your heart to  work harder to pump blood.  For most people, a normal blood pressure is less than 120/80.  Making healthy choices can help lower blood pressure. If your blood pressure does not get lower with healthy choices, you may need to take medicine. This information is not intended to replace advice given to you by your health care provider. Make sure you discuss any questions you have with your health care provider. Document Revised: 07/09/2018 Document Reviewed: 07/09/2018 Elsevier Patient Education  2020 [place] report 2016

## 2020-01-05 NOTE — Progress Notes (Signed)
New Patient Office Visit  Subjective:  Patient ID: [name] [name], female    DOB: 1941-06-18  Age: 79 y.o. MRN: [id number]  CC:  Chief Complaint  Patient presents with  . Establish Care  . Knee Pain    left knee pain    HPI Norman Herrlich presents for [employer] [facility]-estrace, [facility] -mammogram normal Behavioral health-bipolar d/o Cardiologist-cath 2016-+FH parents Ortho-[facility]-left shoulder, bilat hips, right knee, right shoulder Neuro-spinal fusion HTN-Losartan 50mg  -daily-normal renal function 6/20 Left knee-replacement needed-was suppose to have last year Dr. Verneita Griffes Dr. Seoh-rheumatology-[facility] Past Medical History:  Diagnosis Date  . Arthritis   . Bipolar affective disorder ([place])   . Cataract   . Chronic back pain   . Chronic fatigue   . Dizziness   . Dyslipidemia   . Fibromyalgia   . GERD (gastroesophageal reflux disease)   . H/O: hysterectomy   . Hx of adenomatous colonic polyps 2005  . Hyperlipidemia   . Hypertension   . Knee pain   . Non-obstructive CAD    a. 08/2015 Cath: LM 40, LCX small, 30ost, 38m, OM2 small, RCA nl/small, EF 55-65%.  . Osteopenia   . Sciatica     Past Surgical History:  Procedure Laterality Date  . ABDOMINAL HYSTERECTOMY  1994   TAH- DUB, BSO  . [place] bunionectomy     bilateral  . BREAST BIOPSY Left 1989  . BREAST LUMPECTOMY    . BUNIONECTOMY Left 2005  . CARDIAC CATHETERIZATION  2010   +CAD  . CARDIAC CATHETERIZATION N/A 08/19/2015   Procedure: Left Heart Cath and Coronary Angiography;  Surgeon: Belva Crome, MD;  Location: [facility];  Service: Cardiovascular;  Laterality: N/A;  . 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  Remainder of colonic mucosa was normal  . COLONOSCOPY WITH ESOPHAGOGASTRODUODENOSCOPY (EGD) N/A 02/01/2014   [id number] erosive reflux/gastric polyp/normal rectum/scattered left sided diverticula, normal  distal TI. gastric bx negative, fundic gland polyp. next TCS 01/2019.  Marland Kitchen CYSTECTOMY     pilonidial cyst  . ESOPHAGOGASTRODUODENOSCOPY     followed by colonoscopy with snare polypectomy  . right knee replacement  02/2013  . SHOULDER ARTHROSCOPY Left 2005  . SHOULDER ARTHROSCOPY Right   . SPINAL FUSION    . SPINAL FUSION  2004   L4-L5  . TOTAL HIP ARTHROPLASTY Right 06/2006  . TOTAL HIP ARTHROPLASTY  8/12  . TOTAL SHOULDER REPLACEMENT Left 08/2005    Family History  Problem Relation Age of Onset  . Heart disease Mother   . Heart attack Mother   . Heart disease Father   . Heart attack Father   . Stroke Sister   . Stroke Maternal Grandfather   . Cancer Paternal Grandmother        unknown primary  . Colon cancer Neg Hx     Social History   Socioeconomic History  . Marital status: Widowed    Spouse name: Not on file  . Number of children: Not on file  . Years of education: Not on file  . Highest education level: Not on file  Occupational History  . 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 Smoking status: Never Smoker  . Smokeless tobacco: Never Used  Substance and Sexual Activity  . Alcohol use: Yes    Alcohol/week: 7.0 standard drinks    Types: 7 Glasses of wine per week  Comment: almost daily  . Drug use: No  . Sexual activity: Not Currently    Partners: Male    Birth control/protection: Surgical    Comment: TAH  Other Topics Concern  . Not on file  Social History Narrative  . Not on file   Social Determinants of Health   Financial Resource Strain:   . Difficulty of Paying Living Expenses: Not on file  Food Insecurity:   . Worried About Charity fundraiser in the Last Year: Not on file  . Ran Out of Food in the Last Year: Not on file  Transportation Needs:   . Lack of Transportation (Medical): Not on file  . Lack of Transportation (Non-Medical): Not on file  Physical Activity:   . Days of Exercise  per Week: Not on file  . Minutes of Exercise per Session: Not on file  Stress:   . Feeling of Stress : Not on file  Social Connections:   . Frequency of Communication with Friends and Family: Not on file  . Frequency of Social Gatherings with Friends and Family: Not on file  . Attends Religious Services: Not on file  . Active Member of Clubs or Organizations: Not on file  . Attends Archivist Meetings: Not on file  . Marital Status: Not on file  Intimate Partner Violence:   . Fear of Current or Ex-Partner: Not on file  . Emotionally Abused: Not on file  . Physically Abused: Not on file  . Sexually Abused: Not on file    ROS Review of Systems  Constitutional: Negative.   HENT:       Hearing aids bilat-could not cope with getting in and out of ears  Eyes:       Glasses-cataract surgery bilat  Respiratory: Negative.   Cardiovascular:       Sees cardiology  Gastrointestinal: Negative.   Endocrine:       Hypothyroid  Genitourinary: Negative.   Musculoskeletal: Positive for arthralgias and joint swelling.  Skin: Negative.   Allergic/Immunologic: Positive for environmental allergies.       Claritin  Neurological: Negative.   Hematological: Negative.   Psychiatric/Behavioral: Positive for agitation and dysphoric mood.       Bipolar disorder-anxiety/depression' wellbutrin XL Xanax Cymbalta Lamictal Risperdal   Hyperlipidemia-Lipitor-elevated cholesterol Objective:   Today's Vitals: BP 118/74 (BP Location: Left Arm, Patient Position: Sitting)   Pulse 93   Temp 97.8 F (36.6 C) (Temporal)   Ht 4\' 9"  (1.448 m)   Wt 121 lb (54.9 kg)   LMP 02/10/1993   SpO2 95%   BMI 26.18 kg/m   Physical Exam Vitals reviewed.  Constitutional:      Appearance: Normal appearance.  HENT:     Head: Normocephalic and atraumatic.  Eyes:     Conjunctiva/sclera: Conjunctivae normal.  Cardiovascular:     Rate and Rhythm: Normal rate and regular rhythm.     Pulses: Normal  pulses.     Heart sounds: Normal heart sounds.  Pulmonary:     Effort: Pulmonary effort is normal.     Breath sounds: Normal breath sounds.  Musculoskeletal:        General: Normal range of motion.     Cervical back: Normal range of motion and neck supple.  Neurological:     Mental Status: She is alert and oriented to person, place, and time.  Psychiatric:        Mood and Affect: Mood normal.        Behavior:  Behavior normal.     Assessment & Plan:   1. Osteoarthritis of both knees, unspecified osteoarthritis type - Ambulatory referral to Orthopedic Surgery Needs knee replacement 2. Vitamin D deficiency Osteopenia-last DEXA 2016 3. Hypothyroidism, unspecified type TSH 2021 4. Essential hypertension Losartan -daily 5. Hyperlipidemia-Lipitor Outpatient Encounter Medications as of 01/05/2020  Medication Sig  . ALPRAZolam (XANAX) 0.5 MG tablet Take 0.25-0.5 mg by mouth daily as needed for anxiety.   Marland Kitchen aspirin 81 MG tablet Take 81 mg by mouth daily.  Marland Kitchen atorvastatin (LIPITOR) 40 MG tablet TAKE (1) TABLET BY MOUTH AT BEDTIME.  . bisacodyl (WOMENS LAXATIVE) 5 MG EC tablet Take 1 tablet by mouth daily as needed (constipation).   . Calcium Carb-Cholecalciferol (CALCIUM/VITAMIN D PO) Take 1 tablet by mouth daily.  . Cholecalciferol (VITAMIN D) 50 MCG (2000 UT) CAPS Take 2,000 Units by mouth daily.  . Cimetidine (TAGAMET PO) Take 1 tablet by mouth daily.   . DULoxetine (CYMBALTA) 60 MG capsule Take 120 mg by mouth daily.   Marland Kitchen estradiol (ESTRACE) 1 MG tablet TAKE 1/2 TABLET BY MOUTH DAILY. (Patient taking differently: Take 0.5 mg by mouth daily. TAKE 1/2 TABLET BY MOUTH DAILY.)  . lamoTRIgine (LAMICTAL) 150 MG tablet Take 150 mg by mouth at bedtime.  Marland Kitchen levothyroxine (LEVOTHROID) 25 MCG tablet Take 1 tablet (25 mcg total) by mouth daily.  Marland Kitchen losartan (COZAAR) 50 MG tablet Take 1 tablet (50 mg total) by mouth daily.  . meloxicam (MOBIC) 7.5 MG tablet Take 7.5 mg by mouth 2 (two) times daily.    . Multiple Vitamin (MULTIVITAMIN) tablet Take 1 tablet by mouth daily.    . niacin (SLO-NIACIN) 500 MG tablet Take 500 mg by mouth daily.   . nitroGLYCERIN (NITROSTAT) 0.4 MG SL tablet PLACE 1 TAB UNDER TONGUE EVERY 5 MIN IF NEEDED FOR CHEST PAIN. MAY USE 3 TIMES.NO RELIEF CALL 911.  . Omega-3 Fatty Acids (FISH OIL) 1000 MG CAPS Take 1,000 mg by mouth daily.   . Probiotic Product (PROBIOTIC DAILY PO) Take 1 tablet by mouth daily.   . risperiDONE (RISPERDAL) 1 MG tablet Take 0.5 mg by mouth at bedtime.   . WELLBUTRIN XL 300 MG 24 hr tablet Take 300 mg by mouth daily.    No facility-administered encounter medications on file as of 01/05/2020.  74minutes reviewing history, specialist care, physical exam, assessment and plan  Follow-up:  Fasting labwork, 6 month f/u, DEXA Orthopedic referral Kateleen Encarnacion Hannah Beat, MD

## 2020-01-14 ENCOUNTER — Other Ambulatory Visit: Payer: Self-pay

## 2020-01-14 ENCOUNTER — Other Ambulatory Visit (HOSPITAL_COMMUNITY): Payer: Self-pay | Admitting: Student

## 2020-01-14 ENCOUNTER — Emergency Department (HOSPITAL_COMMUNITY)
Admission: EM | Admit: 2020-01-14 | Discharge: 2020-01-14 | Disposition: A | Discharge status: Home / Self Care | Payer: Medicare PPO | Attending: Emergency Medicine | Admitting: Emergency Medicine

## 2020-01-14 ENCOUNTER — Emergency Department (HOSPITAL_COMMUNITY): Payer: Medicare PPO

## 2020-01-14 ENCOUNTER — Encounter (HOSPITAL_COMMUNITY): Payer: Self-pay | Admitting: Emergency Medicine

## 2020-01-14 DIAGNOSIS — G8929 Other chronic pain: Secondary | ICD-10-CM

## 2020-01-14 DIAGNOSIS — Y92481 Parking lot as the place of occurrence of the external cause: Secondary | ICD-10-CM | POA: Insufficient documentation

## 2020-01-14 DIAGNOSIS — E039 Hypothyroidism, unspecified: Secondary | ICD-10-CM | POA: Insufficient documentation

## 2020-01-14 DIAGNOSIS — Y9389 Activity, other specified: Secondary | ICD-10-CM | POA: Insufficient documentation

## 2020-01-14 DIAGNOSIS — R519 Headache, unspecified: Secondary | ICD-10-CM

## 2020-01-14 DIAGNOSIS — Z7982 Long term (current) use of aspirin: Secondary | ICD-10-CM | POA: Insufficient documentation

## 2020-01-14 DIAGNOSIS — Z96612 Presence of left artificial shoulder joint: Secondary | ICD-10-CM | POA: Diagnosis not present

## 2020-01-14 DIAGNOSIS — Z23 Encounter for immunization: Secondary | ICD-10-CM | POA: Insufficient documentation

## 2020-01-14 DIAGNOSIS — Z79899 Other long term (current) drug therapy: Secondary | ICD-10-CM | POA: Insufficient documentation

## 2020-01-14 DIAGNOSIS — Z96641 Presence of right artificial hip joint: Secondary | ICD-10-CM | POA: Insufficient documentation

## 2020-01-14 DIAGNOSIS — I10 Essential (primary) hypertension: Secondary | ICD-10-CM | POA: Insufficient documentation

## 2020-01-14 DIAGNOSIS — Y999 Unspecified external cause status: Secondary | ICD-10-CM | POA: Insufficient documentation

## 2020-01-14 DIAGNOSIS — S0101XA Laceration without foreign body of scalp, initial encounter: Secondary | ICD-10-CM | POA: Insufficient documentation

## 2020-01-14 DIAGNOSIS — W010XXA Fall on same level from slipping, tripping and stumbling without subsequent striking against object, initial encounter: Secondary | ICD-10-CM | POA: Insufficient documentation

## 2020-01-14 DIAGNOSIS — S0990XA Unspecified injury of head, initial encounter: Secondary | ICD-10-CM

## 2020-01-14 DIAGNOSIS — Z96611 Presence of right artificial shoulder joint: Secondary | ICD-10-CM | POA: Insufficient documentation

## 2020-01-14 MED ORDER — TETANUS-DIPHTH-ACELL PERTUSSIS 5-2.5-18.5 LF-MCG/0.5 IM SUSP
0.5000 mL | Freq: Once | INTRAMUSCULAR | Status: AC
  Administered 2020-01-14: 0.5 mL via INTRAMUSCULAR
  Filled 2020-01-14: qty 0.5

## 2020-01-14 NOTE — Discharge Instructions (Addendum)
You were seen in the emergency department today for a head injury with a scalp laceration.  The laceration was repaired with skin glue.  Do not get the area wet for the next 12 hours.  The skin glue will eventually fall off on its own.  Please see attached wound care details. Should the area started to get red, have drainage, or become increasingly painful return to the emergency department.  Please take Tylenol per over-the-counter dosing to help with any discomfort.  Please follow-up with your primary care provider within 3 days for reevaluation.  As discussed is extremely important that you monitor for the following symptoms below, if you start to have any of these symptoms return to the emergency department immediately.  Get help right away if: You have any signs of a stroke. "BE FAST" is an easy way to remember the main warning signs: B - Balance. Signs are dizziness, sudden trouble walking, or loss of balance. E - Eyes. Signs are trouble seeing or a sudden change in how you see. F - Face. Signs are sudden weakness or loss of feeling of the face, or the face or eyelid drooping on one side. A - Arms. Signs are weakness or loss of feeling in an arm. This happens suddenly and usually on one side of the body. S - Speech. Signs are sudden trouble speaking, slurred speech, or trouble understanding what people say. T - Time. Time to call emergency services. Write down what time symptoms started. You have other signs of a stroke, such as: A sudden, very bad headache with no known cause. Feeling sick to your stomach. Throwing up. Jerky movements you cannot control (seizure). These symptoms may be an emergency. Do not wait to see if the symptoms will go away. Get medical help right away. Call your local emergency services (911 in the [place] Do not drive yourself to the hospital.

## 2020-01-14 NOTE — ED Triage Notes (Signed)
Patient has a head laceration with bleeding controlled at this time. Patient was struck in the head by her car trunk lid while loading groceries. No loss of consciousness.

## 2020-01-14 NOTE — ED Provider Notes (Signed)
[facility] EMERGENCY DEPARTMENT Provider Note   CSN: [id number] Arrival date & time: 01/14/20  1801     History Chief Complaint  Patient presents with  . Head Laceration    [name] [name] is a 79 y.o. female with a history bipolar disorder, fibromyalgia, hypertension, & hyperlipidemia who presents to the ED s/p head injury which occurred @ 1500 this afternoon. Patient states she was loading groceries into her car and as she was bent over the trunk came down and struck her in the head this resulted in a head laceration with subsequent headache that is a 6/10 in severity without alleviating/aggravating factors. She subsequently drove herself home and unloaded her groceries. Her son checked her head out and was concerned for bleeding and wanted her to come get checked out.  Had some mild nausea earlier this is now resolved. Denies LOC, emesis, dizziness, numbness, weakness, or neck pain. Patient does not take blood thinners.   HPI     Past Medical History:  Diagnosis Date  . Arthritis   . Bipolar affective disorder ([place])   . Cataract   . Chronic back pain   . Chronic fatigue   . Dizziness   . Dyslipidemia   . Fibromyalgia   . GERD (gastroesophageal reflux disease)   . H/O: hysterectomy   . Hx of adenomatous colonic polyps 2005  . Hyperlipidemia   . Hypertension   . Knee pain   . Non-obstructive CAD    a. 08/2015 Cath: LM 40, LCX small, 30ost, 43m, OM2 small, RCA nl/small, EF 55-65%.  . Osteopenia   . Sciatica     Patient Active Problem List   Diagnosis Date Noted  . Abdominal pain, epigastric 08/14/2019  . Midsternal chest pain 08/19/2015  . Bipolar disorder ([place])   . Hypothyroidism 08/04/2015    Class: Diagnosis of  . Chronic cough 11/23/2014  . Fecal incontinence 11/23/2014  . Constipation 09/22/2013  . H/O adenomatous polyp of colon 09/22/2013  . S/P total knee arthroplasty 04/27/2013  . Knee pain 04/27/2013  . Muscle weakness (generalized) 04/27/2013  .  Chronic fatigue 03/05/2013  . Chronic pain syndrome 03/05/2013  . Osteoarthritis of both knees 03/05/2013  . DJD (degenerative joint disease) of pelvis 03/05/2013  . Vitamin D deficiency 03/05/2013  . Surgery, elective 03/05/2013  . Insomnia 03/05/2013  . Hot flashes 03/05/2013  . History of gastroesophageal reflux (GERD) 03/05/2013  . History of back surgery 03/05/2013  . H/O anxiety disorder 03/05/2013  . Aftercare following joint replacement 08/28/2012  . History of total shoulder replacement 08/28/2012  . Dislocation of hip joint prosthesis ([place]) 02/04/2012  . HTN (hypertension) 10/15/2011  . Degenerative joint disease of pelvic region 09/10/2011  . H/O bilateral hip replacements 07/26/2011  . Right hip pain 07/26/2011  . Difficulty in walking(719.7) 07/26/2011  . Hyperlipidemia 05/11/2009  . BIPOLAR AFFECTIVE DISORDER 05/11/2009  . Coronary artery disease 05/11/2009  . GERD 05/11/2009  . Fibromyalgia 05/11/2009  . DIZZINESS 05/11/2009  . Chest pain 05/11/2009    Past Surgical History:  Procedure Laterality Date  . ABDOMINAL HYSTERECTOMY  1994   TAH- DUB, BSO  . [place] bunionectomy     bilateral  . BREAST BIOPSY Left 1989  . BREAST LUMPECTOMY    . BUNIONECTOMY Left 2005  . CARDIAC CATHETERIZATION  2010   +CAD  . CARDIAC CATHETERIZATION N/A 08/19/2015   Procedure: Left Heart Cath and Coronary Angiography;  Surgeon: Belva Crome, MD;  Location: [facility];  Service:  Cardiovascular;  Laterality: N/A;  . 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  Remainder of colonic mucosa was normal  . COLONOSCOPY WITH ESOPHAGOGASTRODUODENOSCOPY (EGD) N/A 02/01/2014   [id number] erosive reflux/gastric polyp/normal rectum/scattered left sided diverticula, normal distal TI. gastric bx negative, fundic gland polyp. next TCS 01/2019.  Marland Kitchen CYSTECTOMY     pilonidial cyst  . ESOPHAGOGASTRODUODENOSCOPY      followed by colonoscopy with snare polypectomy  . right knee replacement  02/2013  . SHOULDER ARTHROSCOPY Left 2005  . SHOULDER ARTHROSCOPY Right   . SPINAL FUSION    . SPINAL FUSION  2004   L4-L5  . TOTAL HIP ARTHROPLASTY Right 06/2006  . TOTAL HIP ARTHROPLASTY  8/12  . TOTAL SHOULDER REPLACEMENT Left 08/2005     OB History    Gravida  2   Para  2   Term  2   Preterm      AB      Living  2     SAB      TAB      Ectopic      Multiple      Live Births              Family History  Problem Relation Age of Onset  . Heart disease Mother   . Heart attack Mother   . Heart disease Father   . Heart attack Father   . Stroke Sister   . Stroke Maternal Grandfather   . Cancer Paternal Grandmother        unknown primary  . Colon cancer Neg Hx     Social History   Tobacco Use  . Smoking status: Never Smoker  . Smokeless tobacco: Never Used  Substance Use Topics  . Alcohol use: Yes    Alcohol/week: 7.0 standard drinks    Types: 7 Glasses of wine per week    Comment: almost daily  . Drug use: No    Home Medications Prior to Admission medications   Medication Sig Start Date End Date Taking? Authorizing Provider  ALPRAZolam Duanne Moron) 0.5 MG tablet Take 0.25-0.5 mg by mouth daily as needed for anxiety.  12/15/13   [provider]  aspirin 81 MG tablet Take 81 mg by mouth daily.    [provider]  atorvastatin (LIPITOR) 40 MG tablet TAKE (1) TABLET BY MOUTH AT BEDTIME. 12/08/19   Bhagat, Bhavinkumar, PA  bisacodyl (WOMENS LAXATIVE) 5 MG EC tablet Take 1 tablet by mouth daily as needed (constipation).     [provider]  Calcium Carb-Cholecalciferol (CALCIUM/VITAMIN D PO) Take 1 tablet by mouth daily.    [provider]  Cholecalciferol (VITAMIN D) 50 MCG (2000 UT) CAPS Take 2,000 Units by mouth daily.    [provider]  Cimetidine (TAGAMET PO) Take 1 tablet by mouth daily.     [provider]  DULoxetine  (CYMBALTA) 60 MG capsule Take 120 mg by mouth daily.     [provider]  estradiol (ESTRACE) 1 MG tablet TAKE 1/2 TABLET BY MOUTH DAILY. Patient taking differently: Take 0.5 mg by mouth daily. TAKE 1/2 TABLET BY MOUTH DAILY. 11/14/19   Regina Eck, CNM  lamoTRIgine (LAMICTAL) 150 MG tablet Take 150 mg by mouth at bedtime.    [provider]  levothyroxine (LEVOTHROID) 25 MCG tablet Take 1 tablet (25 mcg total) by mouth daily. 08/05/15   Regina Eck, CNM  losartan (COZAAR) 50 MG  tablet Take 1 tablet (50 mg total) by mouth daily. 11/17/19   Fay Records, MD  meloxicam (MOBIC) 7.5 MG tablet Take 7.5 mg by mouth 2 (two) times daily.     [provider]  Multiple Vitamin (MULTIVITAMIN) tablet Take 1 tablet by mouth daily.      [provider]  niacin (SLO-NIACIN) 500 MG tablet Take 500 mg by mouth daily.     [provider]  nitroGLYCERIN (NITROSTAT) 0.4 MG SL tablet PLACE 1 TAB UNDER TONGUE EVERY 5 MIN IF NEEDED FOR CHEST PAIN. MAY USE 3 TIMES.NO RELIEF CALL 911. 06/19/17   Lyda Jester M, PA-C  Omega-3 Fatty Acids (FISH OIL) 1000 MG CAPS Take 1,000 mg by mouth daily.     [provider]  Probiotic Product (PROBIOTIC DAILY PO) Take 1 tablet by mouth daily.     [provider]  risperiDONE (RISPERDAL) 1 MG tablet Take 0.5 mg by mouth at bedtime.     [provider]  WELLBUTRIN XL 300 MG 24 hr tablet Take 300 mg by mouth daily.  07/08/14   [provider]    Allergies    Hydrocodone-acetaminophen  Review of Systems   Review of Systems  Constitutional: Negative for chills and fever.  Eyes: Negative for visual disturbance.  Respiratory: Negative for shortness of breath.   Cardiovascular: Negative for chest pain.  Musculoskeletal: Negative for back pain and neck pain.  Skin: Positive for wound.  Neurological: Positive for headaches. Negative for dizziness, tremors, seizures, syncope, facial asymmetry,  speech difficulty, weakness, light-headedness and numbness.    Physical Exam Updated Vital Signs BP (!) 145/82 (BP Location: Right Arm)   Pulse 97   Temp 97.8 F (36.6 C) (Oral)   Resp 16   Ht 4\' 9"  (1.448 m)   Wt 54.9 kg   LMP 02/10/1993   SpO2 96%   BMI 26.18 kg/m   Physical Exam Vitals and nursing note reviewed.  Constitutional:      General: She is not in acute distress.    Appearance: Normal appearance. She is not toxic-appearing.  HENT:     Head: Normocephalic.      Comments: No raccoon eyes or battle sign.    Ears:     Comments: No hemotympanum.    Nose: No rhinorrhea.     Mouth/Throat:     Pharynx: Oropharynx is clear. Uvula midline.  Eyes:     General: Vision grossly intact. Gaze aligned appropriately.     Extraocular Movements: Extraocular movements intact.     Conjunctiva/sclera: Conjunctivae normal.     Pupils: Pupils are equal, round, and reactive to light.     Comments: No proptosis.   Cardiovascular:     Rate and Rhythm: Normal rate and regular rhythm.  Pulmonary:     Effort: Pulmonary effort is normal.     Breath sounds: Normal breath sounds.  Abdominal:     General: There is no distension.     Palpations: Abdomen is soft.     Tenderness: There is no abdominal tenderness. There is no guarding or rebound.  Musculoskeletal:     Cervical back: Normal range of motion and neck supple. Tenderness (Bilateral paraspinal muscles.) present. No rigidity. No spinous process tenderness.  Skin:    General: Skin is warm and dry.  Neurological:     Mental Status: She is alert.     Comments: Alert. Clear speech. No facial droop. CNIII-XII grossly intact. Bilateral upper and lower extremities' sensation grossly  intact. 5/5 symmetric strength with grip strength and with plantar and dorsi flexion bilaterally . Normal finger to nose bilaterally. Negative pronator drift. Gait intact.    Psychiatric:        Mood and Affect: Mood normal.        Behavior: Behavior  normal.    ED Results / Procedures / Treatments   Labs (all labs ordered are listed, but only abnormal results are displayed) Labs Reviewed - No data to display  EKG None  Radiology No results found.  Procedures .Marland KitchenLaceration Repair  Date/Time: 01/14/2020 8:06 PM Performed by: Amaryllis Dyke, PA-C Authorized by: Amaryllis Dyke, PA-C   Consent:    Consent obtained:  Verbal   Consent given by:  Patient   Risks discussed:  Infection, need for additional repair, poor cosmetic result, pain and retained foreign body   Alternatives discussed:  No treatment Anesthesia (see MAR for exact dosages):    Anesthesia method:  None Laceration details:    Location:  Scalp   Scalp location: parietal scalp.   Length (cm):  1.5   Depth (mm):  2 Repair type:    Repair type:  Simple Exploration:    Hemostasis achieved with:  Direct pressure   Wound exploration: wound explored through full range of motion and entire depth of wound probed and visualized     Contaminated: no   Treatment:    Area cleansed with:  Hibiclens   Amount of cleaning:  Standard   Irrigation solution:  Sterile saline and sterile water   Irrigation method:  Pressure wash   Visualized foreign bodies/material removed: no   Skin repair:    Repair method:  Tissue adhesive (With hair apposition technique.) Approximation:    Approximation:  Close Post-procedure details:    Dressing:  Open (no dressing)   Patient tolerance of procedure:  Tolerated well, no immediate complications   (including critical care time)  Medications Ordered in ED Medications - No data to display  ED Course  I have reviewed the triage vital signs and the nursing notes.  Pertinent labs & imaging results that were available during my care of the patient were reviewed by me and considered in my medical decision making (see chart for details).    MDM Rules/Calculators/A&P                     Patient presents to the emergency  department status post head injury with scalp laceration.  Patient nontoxic, resting comfortably, vitals without significant abnormality, BP mildly elevated-PCP recheck.   Based on patient's age and mechanism we recommended CT imaging, she would like to forego this testing, we discussed risk/benefit, patient proceeded to decline imaging, given she has a normal neurologic exam and is not having episodes of emesis or seizure-like activity feel this is reasonable.  Her wound was cleansed and repaired per procedure note above, tolerated well.  Tetanus updated.  Will discharge home with very strict return precautions and PCP follow-up. I discussed results, treatment plan, need for follow-up, and return precautions with the patient. Provided opportunity for questions, patient confirmed understanding and is in agreement with plan.    Findings and plan of care discussed with supervising physician Dr. Langston Masker who has evaluated patient & is in agreement.   Final Clinical Impression(s) / ED Diagnoses Final diagnoses:  Injury of head, initial encounter  Laceration of scalp, initial encounter    Rx / DC Orders ED Discharge Orders    None  Leafy Kindle 01/14/20 2011    Wyvonnia Dusky, MD 01/15/20 1329

## 2020-01-14 NOTE — ED Notes (Signed)
ED Provider at bedside. 

## 2020-01-18 ENCOUNTER — Telehealth: Payer: Self-pay | Admitting: Family Medicine

## 2020-01-18 NOTE — Telephone Encounter (Signed)
Patient was informed of below msg from Dr Holly Bodily

## 2020-01-18 NOTE — Telephone Encounter (Signed)
We can not order a radiology test without assessment. Pt can return to ER for re-assessment and completion of recommended testing. Concern for headache with concussion based on pts ER note.  [facility] does not have CT scanner

## 2020-01-18 NOTE — Telephone Encounter (Signed)
Routing to [place] since Beverlee Nims is filling in at another office.
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
## 2020-01-18 NOTE — Telephone Encounter (Signed)
Please advise. I wanted to check with you before telling patient she needed to be seen again before we can put an order in for a CT

## 2020-01-18 NOTE — Telephone Encounter (Signed)
Please advise 

## 2020-01-18 NOTE — Telephone Encounter (Signed)
Pt called back and wanted to come into the office this evening, informed her we did not have any openings this evening as we close at 5. I recommended the ER. Patient states she will go get evaluated.

## 2020-01-19 ENCOUNTER — Other Ambulatory Visit: Payer: Self-pay

## 2020-01-19 ENCOUNTER — Emergency Department (HOSPITAL_COMMUNITY)
Admission: EM | Admit: 2020-01-19 | Discharge: 2020-01-19 | Disposition: A | Discharge status: Home / Self Care | Payer: Medicare PPO | Attending: Emergency Medicine | Admitting: Emergency Medicine

## 2020-01-19 ENCOUNTER — Ambulatory Visit: Payer: Medicare PPO | Admitting: Orthopaedic Surgery

## 2020-01-19 ENCOUNTER — Encounter (HOSPITAL_COMMUNITY): Payer: Self-pay

## 2020-01-19 ENCOUNTER — Emergency Department (HOSPITAL_COMMUNITY): Payer: Medicare PPO

## 2020-01-19 DIAGNOSIS — Z7982 Long term (current) use of aspirin: Secondary | ICD-10-CM | POA: Diagnosis not present

## 2020-01-19 DIAGNOSIS — Y92810 Car as the place of occurrence of the external cause: Secondary | ICD-10-CM | POA: Insufficient documentation

## 2020-01-19 DIAGNOSIS — I10 Essential (primary) hypertension: Secondary | ICD-10-CM | POA: Diagnosis not present

## 2020-01-19 DIAGNOSIS — Y939 Activity, unspecified: Secondary | ICD-10-CM | POA: Insufficient documentation

## 2020-01-19 DIAGNOSIS — S0990XA Unspecified injury of head, initial encounter: Secondary | ICD-10-CM | POA: Diagnosis not present

## 2020-01-19 DIAGNOSIS — W228XXA Striking against or struck by other objects, initial encounter: Secondary | ICD-10-CM | POA: Diagnosis not present

## 2020-01-19 DIAGNOSIS — M797 Fibromyalgia: Secondary | ICD-10-CM | POA: Diagnosis not present

## 2020-01-19 DIAGNOSIS — Z79899 Other long term (current) drug therapy: Secondary | ICD-10-CM | POA: Diagnosis not present

## 2020-01-19 DIAGNOSIS — Y999 Unspecified external cause status: Secondary | ICD-10-CM | POA: Insufficient documentation

## 2020-01-19 DIAGNOSIS — R519 Headache, unspecified: Secondary | ICD-10-CM | POA: Diagnosis not present

## 2020-01-19 NOTE — Telephone Encounter (Signed)
FYI
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
## 2020-01-19 NOTE — Discharge Instructions (Signed)
You had a normal head scan today. Please follow up with your [facility] care provider for further workup and treatment. Please avoid any activites which may worsen your headache including looking at a Tv/computer/phone screen for too long. Thank you for allowing me to care for you today. Please return to the emergency department if you have new or worsening symptoms.

## 2020-01-28 ENCOUNTER — Ambulatory Visit: Payer: Medicare PPO | Admitting: Orthopaedic Surgery

## 2020-02-02 ENCOUNTER — Ambulatory Visit (INDEPENDENT_AMBULATORY_CARE_PROVIDER_SITE_OTHER): Payer: Medicare PPO

## 2020-02-02 ENCOUNTER — Ambulatory Visit: Payer: Medicare PPO | Admitting: Orthopaedic Surgery

## 2020-02-02 ENCOUNTER — Encounter: Payer: Self-pay | Admitting: Certified Nurse Midwife

## 2020-02-02 ENCOUNTER — Other Ambulatory Visit: Payer: Self-pay

## 2020-02-02 ENCOUNTER — Encounter: Payer: Self-pay | Admitting: Orthopaedic Surgery

## 2020-02-02 DIAGNOSIS — M1712 Unilateral primary osteoarthritis, left knee: Secondary | ICD-10-CM

## 2020-02-02 DIAGNOSIS — G8929 Other chronic pain: Secondary | ICD-10-CM

## 2020-02-02 DIAGNOSIS — M25562 Pain in left knee: Secondary | ICD-10-CM

## 2020-02-02 NOTE — Progress Notes (Signed)
Office Visit Note   Patient: [name] [name]           Date of Birth: 27-Apr-1941           MRN: [id number] Visit Date: 02/02/2020              Requested by: [name] Hancock, MD [address] [place],  [place] [address] PCP: [name] Hancock, MD   Assessment & Plan: Visit Diagnoses:  1. Primary osteoarthritis of left knee     Plan: Impression is end-stage left knee DJD with valgus deformity.  Given the lack of pain relief from conservative treatment patient would like to proceed with scheduling for a left total knee replacement.  We again reviewed the risk benefits alternatives to surgery including infection, stiffness, incomplete relief of pain, DVT, etc.  She understands and wishes to proceed.  The plan would be for postoperative SNF placement for a short-term with possibility of discharging home if she is able to find proper arrangements with her children.  We will obtain preoperative cardiac clearance first prior to scheduling surgery.  All questions answered to her satisfaction.  Follow-Up Instructions: Return if symptoms worsen or fail to improve.   Orders:  Orders Placed This Encounter  Procedures  . XR KNEE 3 VIEW LEFT   No orders of the defined types were placed in this encounter.     Procedures: No procedures performed   Clinical Data: No additional findings.   Subjective: Chief Complaint  Patient presents with  . Left Knee - Pain    [name] [name] is a very pleasant 79 year old female comes in for evaluation of chronic severe left knee pain for the last 2 to 3 years.  The pain has definitely gotten worse over time.  She states that she is no longer able to stand or walk for you stairs without severe pain.  She has undergone extensive conservative treatment in hopes of finding pain relief but unfortunately this has been unsuccessful.  She is status post both hip replacements and right total knee replacement 2014 by Dr. Hassell Done [facility].  She has gone to a SNF postoperatively  each time.  Recently she has been doing flexor Genex injections and they have not helped.  She ambulates with a cane and takes meloxicam to help ease the pain.   Review of Systems  Constitutional: Negative.   HENT: Negative.   Eyes: Negative.   Respiratory: Negative.   Cardiovascular: Negative.   Endocrine: Negative.   Musculoskeletal: Negative.   Neurological: Negative.   Hematological: Negative.   Psychiatric/Behavioral: Negative.   All other systems reviewed and are negative.    Objective: Vital Signs: LMP 02/10/1993   Physical Exam Vitals and nursing note reviewed.  Constitutional:      Appearance: She is well-developed.  HENT:     Head: Normocephalic and atraumatic.  Pulmonary:     Effort: Pulmonary effort is normal.  Abdominal:     Palpations: Abdomen is soft.  Musculoskeletal:     Cervical back: Neck supple.  Skin:    General: Skin is warm.     Capillary Refill: Capillary refill takes less than 2 seconds.  Neurological:     Mental Status: She is alert and oriented to person, place, and time.  Psychiatric:        Behavior: Behavior normal.        Thought Content: Thought content normal.        Judgment: Judgment normal.     Ortho  Exam Left knee exam shows a slight valgus deformity.  No flexion contracture.  Neurovascular intact distally.  Moderate pain with range of motion.  Mild restriction range of motion.  Collaterals and cruciates are stable.  Severe crepitus with range of motion.  Trace joint effusion. Specialty Comments:  No specialty comments available.  Imaging: XR KNEE 3 VIEW LEFT  Result Date: 02/02/2020 Stable right total knee replacement without complication. Advanced tricompartmental degenerative joint disease with valgus deformity    PMFS History: Patient Active Problem List   Diagnosis Date Noted  . Abdominal pain, epigastric 08/14/2019  . Midsternal chest pain 08/19/2015  . Bipolar disorder ([place])   . Hypothyroidism 08/04/2015     Class: Diagnosis of  . Chronic cough 11/23/2014  . Fecal incontinence 11/23/2014  . Constipation 09/22/2013  . H/O adenomatous polyp of colon 09/22/2013  . S/P total knee arthroplasty 04/27/2013  . Knee pain 04/27/2013  . Muscle weakness (generalized) 04/27/2013  . Chronic fatigue 03/05/2013  . Chronic pain syndrome 03/05/2013  . Osteoarthritis of both knees 03/05/2013  . DJD (degenerative joint disease) of pelvis 03/05/2013  . Vitamin D deficiency 03/05/2013  . Surgery, elective 03/05/2013  . Insomnia 03/05/2013  . Hot flashes 03/05/2013  . History of gastroesophageal reflux (GERD) 03/05/2013  . History of back surgery 03/05/2013  . H/O anxiety disorder 03/05/2013  . Aftercare following joint replacement 08/28/2012  . History of total shoulder replacement 08/28/2012  . Dislocation of hip joint prosthesis ([place]) 02/04/2012  . HTN (hypertension) 10/15/2011  . Degenerative joint disease of pelvic region 09/10/2011  . H/O bilateral hip replacements 07/26/2011  . Right hip pain 07/26/2011  . Difficulty in walking(719.7) 07/26/2011  . Hyperlipidemia 05/11/2009  . BIPOLAR AFFECTIVE DISORDER 05/11/2009  . Coronary artery disease 05/11/2009  . GERD 05/11/2009  . Fibromyalgia 05/11/2009  . DIZZINESS 05/11/2009  . Chest pain 05/11/2009   Past Medical History:  Diagnosis Date  . Arthritis   . Bipolar affective disorder ([place])   . Cataract   . Chronic back pain   . Chronic fatigue   . Dizziness   . Dyslipidemia   . Fibromyalgia   . GERD (gastroesophageal reflux disease)   . H/O: hysterectomy   . Hx of adenomatous colonic polyps 2005  . Hyperlipidemia   . Hypertension   . Knee pain   . Non-obstructive CAD    a. 08/2015 Cath: LM 40, LCX small, 30ost, 45m, OM2 small, RCA nl/small, EF 55-65%.  . Osteopenia   . Sciatica     Family History  Problem Relation Age of Onset  . Heart disease Mother   . Heart attack Mother   . Heart disease Father   . Heart attack Father   .  Stroke Sister   . Stroke Maternal Grandfather   . Cancer Paternal Grandmother        unknown primary  . Colon cancer Neg Hx     Past Surgical History:  Procedure Laterality Date  . ABDOMINAL HYSTERECTOMY  1994   TAH- DUB, BSO  . [place] bunionectomy     bilateral  . BREAST BIOPSY Left 1989  . BREAST LUMPECTOMY    . BUNIONECTOMY Left 2005  . CARDIAC CATHETERIZATION  2010   +CAD  . CARDIAC CATHETERIZATION N/A 08/19/2015   Procedure: Left Heart Cath and Coronary Angiography;  Surgeon: Belva Crome, MD;  Location: [place] [facility];  Service: Cardiovascular;  Laterality: N/A;  . COLONOSCOPY  01/29/2007   [id number] rectum, left-sided diverticula, diffusely pigmented  colon consistent with melanosis coli.  Remainder of colonic mucosa was normal  . COLONOSCOPY WITH ESOPHAGOGASTRODUODENOSCOPY (EGD) N/A 02/01/2014   [id number] erosive reflux/gastric polyp/normal rectum/scattered left sided diverticula, normal distal TI. gastric bx negative, fundic gland polyp. next TCS 01/2019.  Marland Kitchen CYSTECTOMY     pilonidial cyst  . ESOPHAGOGASTRODUODENOSCOPY     followed by colonoscopy with snare polypectomy  . right knee replacement  02/2013  . SHOULDER ARTHROSCOPY Left 2005  . SHOULDER ARTHROSCOPY Right   . SPINAL FUSION    . SPINAL FUSION  2004   L4-L5  . TOTAL HIP ARTHROPLASTY Right 06/2006  . TOTAL HIP ARTHROPLASTY  8/12  . TOTAL SHOULDER REPLACEMENT Left 08/2005   Social History   Occupational History  . 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 Smoking status: Never Smoker  . Smokeless tobacco: Never Used  Substance and Sexual Activity  . Alcohol use: Yes    Alcohol/week: 7.0 standard drinks    Types: 7 Glasses of wine per week    Comment: almost daily  . Drug use: No  . Sexual activity: Not Currently    Partners: Male    Birth control/protection: Surgical    Comment: TAH

## 2020-02-04 ENCOUNTER — Telehealth: Payer: Self-pay | Admitting: *Deleted

## 2020-02-04 NOTE — Telephone Encounter (Signed)
    [facility] Pre-operative Risk Assessment    Request for surgical clearance:  1. What type of surgery is being performed? LEFT TOTAL KNEE ARTHOPLASTY    2. When is this surgery scheduled? TBD   3. What type of clearance is required (medical clearance vs. Pharmacy clearance to hold med vs. Both)? MEDICAL  4. Are there any medications that need to be held prior to surgery and how long? ASA    5. Practice name and name of physician performing surgery? [facility]; [facility]   6. What is your office phone number [phone]    7.   What is your office fax number [phone] ATTN: SHERRIE  8.   Anesthesia type (None, local, MAC, general) ? SPINAL AND BLOCK   Julaine Hua 02/04/2020, 2:19 PM  _________________________________________________________________   (provider comments below)

## 2020-02-05 NOTE — Telephone Encounter (Signed)
   Primary Cardiologist:Paula Harrington Challenger, MD  Chart reviewed as part of pre-operative protocol coverage. Because of Keyira Stahl Goldwasser's past medical history and time since last visit, he/she will require a follow-up visit in order to better assess preoperative cardiovascular risk.  Pt was last seen in the office on 06/11/2018  Pre-op covering staff: - Please schedule appointment and call patient to inform them. - Please contact requesting surgeon's office via preferred method (i.e, phone, fax) to inform them of need for appointment prior to surgery.   Daune Perch, NP  02/05/2020, 9:20 AM

## 2020-02-05 NOTE — Telephone Encounter (Signed)
Pt is agreeable to appt needed for pre op clearance. I have scheduled the pt to see Cecilie Kicks, NP 02/09/20 @ 11:15. Pt is aware to come no more than 15 minutes early with her mask. I will send clearance notes to NP for upcoming appt. I will send FYI to the surgeon Dr. Eduard Roux. I will remove from the pre op cal back pool.

## 2020-02-05 NOTE — Telephone Encounter (Signed)
Good morning Dr. Erlinda Hong, you are welcome.

## 2020-02-05 NOTE — Telephone Encounter (Signed)
Thank you  for update

## 2020-02-08 NOTE — Progress Notes (Addendum)
Cardiology Office Note   Date:  02/09/2020   ID:  [name] [name], DOB February 19, 1941, MRN [id number]  PCP:  Maryruth Hancock, MD  Cardiologist:  Dr. Harrington Challenger    Chief Complaint  Patient presents with  . Pre-op Exam      History of Present Illness: [name] [name] is a 79 y.o. female who presents for pre-op clearance for total knee arthoplasty and hold ASA with spinal block.  .   past medical history of hypertension, bipolar disorder, arthritis, hyperlipidemia hypothyroidism, fibromyalgia.  She was admitted for chest pain in October 2016 and ruled out for myocardial infarction.  LV function was normal on echocardiogram with mild diastolic dysfunction.  Cardiac catheterization demonstrated nonobstructive coronary artery disease and medical therapy was continued.  She was last seen in clinic by Leanor Kail, PA-C in 04/2018.  She complained of one episode of chest pain and palpitations.  Stress testing and an event monitor was discussed.  However, it was ultimately decided to start on low dose beta-blocker.    Last OV 06/11/18 [name] [name] returns for follow-up.  She has felt more fatigued on metoprolol.  She really has not noticed much of a difference other than being fatigued with this medication.  She has not had any other chest discomfort since June.  She does have some shortness of breath with going up her steps.  This is overall stable without significant change.  She denies orthopnea, PND, syncope.  She does have some lower extremity swelling.  She wears a knee brace sometimes and notices increased swelling in her left leg.    Today she has no chest pain but due to knee pain difficult to ambulate though we know she has non obstructed CAD.  She can walk up 1 flight of stairs without angina.  She has some DOE.    Past Medical History:  Diagnosis Date  . Arthritis   . Bipolar affective disorder ([place])   . Cataract   . Chronic back pain   . Chronic fatigue   . Dizziness   . Dyslipidemia    . Fibromyalgia   . GERD (gastroesophageal reflux disease)   . H/O: hysterectomy   . Hx of adenomatous colonic polyps 2005  . Hyperlipidemia   . Hypertension   . Knee pain   . Non-obstructive CAD    a. 08/2015 Cath: LM 40, LCX small, 30ost, 8m OM2 small, RCA nl/small, EF 55-65%.  . Osteopenia   . Sciatica     Past Surgical History:  Procedure Laterality Date  . ABDOMINAL HYSTERECTOMY  1994   TAH- DUB, BSO  . [place] bunionectomy     bilateral  . BREAST BIOPSY Left 1989  . BREAST LUMPECTOMY    . BUNIONECTOMY Left 2005  . CARDIAC CATHETERIZATION  2010   +CAD  . CARDIAC CATHETERIZATION N/A 08/19/2015   Procedure: Left Heart Cath and Coronary Angiography;  Surgeon: HBelva Crome MD;  [facility];  Service: Cardiovascular;  Laterality: N/A;  . 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  Remainder of colonic mucosa was normal  . COLONOSCOPY WITH ESOPHAGOGASTRODUODENOSCOPY (EGD) N/A 02/01/2014   RDXI:PJASerosive reflux/gastric polyp/normal rectum/scattered left sided diverticula, normal distal TI. gastric bx negative, fundic gland polyp. next TCS 01/2019.  .Marland KitchenCYSTECTOMY     pilonidial cyst  . ESOPHAGOGASTRODUODENOSCOPY     followed by colonoscopy with snare polypectomy  . right knee replacement  02/2013  .  SHOULDER ARTHROSCOPY Left 2005  . SHOULDER ARTHROSCOPY Right   . SPINAL FUSION    . SPINAL FUSION  2004   L4-L5  . TOTAL HIP ARTHROPLASTY Right 06/2006  . TOTAL HIP ARTHROPLASTY  8/12  . TOTAL SHOULDER REPLACEMENT Left 08/2005     Current Outpatient Medications  Medication Sig Dispense Refill  . ALPRAZolam (XANAX) 0.5 MG tablet Take 0.25-0.5 mg by mouth daily as needed for anxiety.     Marland Kitchen aspirin 81 MG tablet Take 81 mg by mouth daily.    Marland Kitchen atorvastatin (LIPITOR) 40 MG tablet TAKE (1) TABLET BY MOUTH AT BEDTIME. 30 tablet 0  . bisacodyl (WOMENS LAXATIVE) 5 MG EC tablet Take 1  tablet by mouth daily as needed (constipation).     . Calcium Carb-Cholecalciferol (CALCIUM/VITAMIN D PO) Take 1 tablet by mouth daily.    . Cholecalciferol (VITAMIN D) 50 MCG (2000 UT) CAPS Take 2,000 Units by mouth daily.    . Cimetidine (TAGAMET PO) Take 1 tablet by mouth daily.     . DULoxetine (CYMBALTA) 60 MG capsule Take 120 mg by mouth daily.     Marland Kitchen estradiol (ESTRACE) 1 MG tablet TAKE 1/2 TABLET BY MOUTH DAILY. 30 tablet 11  . lamoTRIgine (LAMICTAL) 150 MG tablet Take 150 mg by mouth at bedtime.    Marland Kitchen levothyroxine (LEVOTHROID) 25 MCG tablet Take 1 tablet (25 mcg total) by mouth daily. 30 tablet 12  . losartan (COZAAR) 50 MG tablet Take 1 tablet (50 mg total) by mouth daily. 90 tablet 1  . meloxicam (MOBIC) 7.5 MG tablet Take 7.5 mg by mouth 2 (two) times daily.     . Multiple Vitamin (MULTIVITAMIN) tablet Take 1 tablet by mouth daily.      . niacin (SLO-NIACIN) 500 MG tablet Take 500 mg by mouth daily.     . nitroGLYCERIN (NITROSTAT) 0.4 MG SL tablet PLACE 1 TAB UNDER TONGUE EVERY 5 MIN IF NEEDED FOR CHEST PAIN. MAY USE 3 TIMES.NO RELIEF CALL 911. 25 tablet 3  . Omega-3 Fatty Acids (FISH OIL) 1000 MG CAPS Take 1,000 mg by mouth daily.     . Probiotic Product (PROBIOTIC DAILY PO) Take 1 tablet by mouth daily.     . risperiDONE (RISPERDAL) 1 MG tablet Take 0.5 mg by mouth at bedtime.     . WELLBUTRIN XL 300 MG 24 hr tablet Take 300 mg by mouth daily.      No current facility-administered medications for this visit.    Allergies:   Hydrocodone-acetaminophen    Social History:  The patient  reports that she has never smoked. She has never used smokeless tobacco. She reports current alcohol use of about 7.0 standard drinks of alcohol per week. She reports that she does not use drugs.   Family History:  The patient's family history includes Cancer in her paternal grandmother; Heart attack in her father and mother; Heart disease in her father and mother; Stroke in her maternal grandfather  and sister.    ROS:  General:no colds or fevers, no weight changes Skin:no rashes or ulcers HEENT:no blurred vision, no congestion CV:see HPI PUL:see HPI GI:no diarrhea constipation or melena, no indigestion GU:no hematuria, no dysuria MS:no joint pain, no claudication Neuro:no syncope, no lightheadedness Endo:no diabetes, no thyroid disease  Wt Readings from Last 3 Encounters:  02/09/20 118 lb 6.4 oz (53.7 kg)  01/19/20 119 lb 0.8 oz (54 kg)  01/14/20 121 lb (54.9 kg)     PHYSICAL EXAM: VS:  BP  116/70   Pulse 95   Ht _0  (1.448 m)   Wt 118 lb 6.4 oz (53.7 kg)   LMP 02/10/1993   SpO2 97%   BMI 25.62 kg/m  , BMI Body mass index is 25.62 kg/m. General:Pleasant affect, NAD Skin:Warm and dry, brisk capillary refill HEENT:normocephalic, sclera clear, mucus membranes moist Neck:supple, no JVD, no bruits  Heart:S1S2 RRR with 2/6 systolic murmur,no gallup, rub or click Lungs:clear without rales, rhonchi, or wheezes DTH:YHOO, non tender, + BS, do not palpate liver spleen or masses Ext:no lower ext edema, 2+ pedal pulses, 2+ radial pulses Neuro:alert and oriented X 3, MAE, follows commands, + facial symmetry    EKG:  EKG is ordered today. The ekg ordered today demonstrates SR at 94 no ST changes from prior tracing 06/11/2018.  Stable EKG   Recent Labs: 04/17/2019: ALT 30; BUN 10; Creatinine 0.8; Hemoglobin 14.0; Platelets 325; Potassium 4.7; Sodium 135; TSH 4.71    Lipid Panel    Component Value Date/Time   CHOL 171 04/17/2019 0000   TRIG 127 04/17/2019 0000   HDL 67 04/17/2019 0000   CHOLHDL 1.9 05/08/2016 0952   VLDL 14 05/08/2016 0952   LDLCALC 79 04/17/2019 0000       Other studies Reviewed: Additional studies/ records that were reviewed today include: . Cardiac catheterization 08/19/2015  LM 40 LCx ostial 30, mid 55  Widely patent coronary arteries with less than 50% obstruction in the ostial LAD, mild diffuse mid circumflex disease, mild proximal  circumflex disease, and widely patent right coronary artery  Normal left ventricular size and function, with EF greater than 60%.  Echocardiogram 08/19/2015 EF 60-65, normal wall motion, grade 1 diastolic dysfunction, mild MAC, mild LAE, mild TR, PASP 32  Cardiac Catheterization 2010 RCA prox 20-30 LAD ostial 50-60 Normal EF   ASSESSMENT AND PLAN:  1.  Pre-op eval for lt total knee.  - her activity level is somewhat limited due to knee pain.  She has non obstructing CAD by cath.  With her murmur will check echo and if no acute AS will clear her for surgery.   She is to have colonoscopy in future before total knee so she is cleared for this.    2.  Murmur will eval with echo prior to clearing for surgery, last echo 2019 hopefully no AS   3.  CAD has been non obstructive on prior cath, no chest pain.  No longer on BB, on fish oil and lipitor   4.   HTN controlled   5.  hld on statin followed by PCP - labs from PCP reviewed with Tchol 171, HDL 67, LDL 79, TG 127 Cr 0.80 K+ 4.7    Addendum 02/11/20 Echo was stable normal pump action no AS will send note for surgery.     Current medicines are reviewed with the patient today.  The patient Has no concerns regarding medicines.  The following changes have been made:  See above Labs/ tests ordered today include:see above  Disposition:   FU:  see above  Signed, Cecilie Kicks, NP  02/09/2020 2:21 PM    [facility] [address] [place], [place] Phone: [phone]; Fax: [phone]

## 2020-02-09 ENCOUNTER — Ambulatory Visit: Payer: Medicare PPO | Admitting: Cardiology

## 2020-02-09 ENCOUNTER — Other Ambulatory Visit: Payer: Self-pay

## 2020-02-09 ENCOUNTER — Encounter: Payer: Self-pay | Admitting: Cardiology

## 2020-02-09 VITALS — BP 116/70 | HR 95 | Ht <= 58 in | Wt 118.4 lb

## 2020-02-09 DIAGNOSIS — Z01818 Encounter for other preprocedural examination: Secondary | ICD-10-CM

## 2020-02-09 DIAGNOSIS — I2510 Atherosclerotic heart disease of native coronary artery without angina pectoris: Secondary | ICD-10-CM | POA: Diagnosis not present

## 2020-02-09 DIAGNOSIS — R011 Cardiac murmur, unspecified: Secondary | ICD-10-CM

## 2020-02-09 DIAGNOSIS — I10 Essential (primary) hypertension: Secondary | ICD-10-CM

## 2020-02-09 DIAGNOSIS — E782 Mixed hyperlipidemia: Secondary | ICD-10-CM

## 2020-02-09 NOTE — Patient Instructions (Signed)
Medication Instructions:  Your physician recommends that you continue on your current medications as directed. Please refer to the Current Medication list given to you today.  *If you need a refill on your cardiac medications before your next appointment, please call your pharmacy*   Lab Work: None ordered  If you have labs (blood work) drawn today and your tests are completely normal, you will receive your results only by: Marland Kitchen MyChart Message (if you have MyChart) OR . A paper copy in the mail If you have any lab test that is abnormal or we need to change your treatment, we will call you to review the results.   Testing/Procedures: Your physician has requested that you have an echocardiogram ASAP [place].  [place] IF AVAILABLE. Echocardiography is a painless test that uses sound waves to create images of your heart. It provides your doctor with information about the size and shape of your heart and how well your heart's chambers and valves are working. This procedure takes approximately one hour. There are no restrictions for this procedure.     Follow-Up: At [facility], you and your health needs are our priority.  As part of our continuing mission to provide you with exceptional heart care, we have created designated Provider Care Teams.  These Care Teams include your primary Cardiologist (physician) and Advanced Practice Providers (APPs -  Physician Assistants and Nurse Practitioners) who all work together to provide you with the care you need, when you need it.  We recommend signing up for the patient portal called "MyChart".  Sign up information is provided on this After Visit Summary.  MyChart is used to connect with patients for Virtual Visits (Telemedicine).  Patients are able to view lab/test results, encounter notes, upcoming appointments, etc.  Non-urgent messages can be sent to your provider as well.   To learn more about what you can do with MyChart, go to  [email or web address].    Your next appointment:   DEPENDING UPON ECHO RESULTS   Other Instructions  Echocardiogram An echocardiogram is a procedure that uses painless sound waves (ultrasound) to produce an image of the heart. Images from an echocardiogram can provide important information about:  Signs of coronary artery disease (CAD).  Aneurysm detection. An aneurysm is a weak or damaged part of an artery wall that bulges out from the normal force of blood pumping through the body.  Heart size and shape. Changes in the size or shape of the heart can be associated with certain conditions, including heart failure, aneurysm, and CAD.  Heart muscle function.  Heart valve function.  Signs of a past heart attack.  Fluid buildup around the heart.  Thickening of the heart muscle.  A tumor or infectious growth around the heart valves. Tell a health care provider about:  Any allergies you have.  All medicines you are taking, including vitamins, herbs, eye drops, creams, and over-the-counter medicines.  Any blood disorders you have.  Any surgeries you have had.  Any medical conditions you have.  Whether you are pregnant or may be pregnant. What are the risks? Generally, this is a safe procedure. However, problems may occur, including:  Allergic reaction to dye (contrast) that may be used during the procedure. What happens before the procedure? No specific preparation is needed. You may eat and drink normally. What happens during the procedure?   An IV tube may be inserted into one of your veins.  You may receive contrast through this tube. A contrast is  an injection that improves the quality of the pictures from your heart.  A gel will be applied to your chest.  A wand-like tool (transducer) will be moved over your chest. The gel will help to transmit the sound waves from the transducer.  The sound waves will harmlessly bounce off of your heart to allow the heart  images to be captured in real-time motion. The images will be recorded on a computer. The procedure may vary among health care providers and hospitals. What happens after the procedure?  You may return to your normal, everyday life, including diet, activities, and medicines, unless your health care provider tells you not to do that. Summary  An echocardiogram is a procedure that uses painless sound waves (ultrasound) to produce an image of the heart.  Images from an echocardiogram can provide important information about the size and shape of your heart, heart muscle function, heart valve function, and fluid buildup around your heart.  You do not need to do anything to prepare before this procedure. You may eat and drink normally.  After the echocardiogram is completed, you may return to your normal, everyday life, unless your health care provider tells you not to do that. This information is not intended to replace advice given to you by your health care provider. Make sure you discuss any questions you have with your health care provider. Document Revised: 02/19/2019 Document Reviewed: 12/01/2016 Elsevier Patient Education  [employer].

## 2020-02-10 ENCOUNTER — Telehealth: Payer: Self-pay | Admitting: Cardiology

## 2020-02-10 ENCOUNTER — Ambulatory Visit (HOSPITAL_COMMUNITY)
Admission: RE | Admit: 2020-02-10 | Discharge: 2020-02-10 | Disposition: A | Discharge status: Home / Self Care | Payer: Medicare PPO | Source: Ambulatory Visit | Attending: Cardiology | Admitting: Cardiology

## 2020-02-10 DIAGNOSIS — R011 Cardiac murmur, unspecified: Secondary | ICD-10-CM | POA: Diagnosis present

## 2020-02-10 DIAGNOSIS — I2510 Atherosclerotic heart disease of native coronary artery without angina pectoris: Secondary | ICD-10-CM | POA: Diagnosis not present

## 2020-02-10 NOTE — Telephone Encounter (Signed)
-----   Message from Isaiah Serge, NP sent at 02/10/2020 12:42 PM EDT ----- Echo is good, valves without acute issues, her pump action is normal.  She does have some stiffness of Lt ventricle.not unexpected with her age.  I will send clearance for surgery- tomorrow.  Follow up in [place] in 9 months.

## 2020-02-10 NOTE — Progress Notes (Signed)
*  PRELIMINARY RESULTS* Echocardiogram 2D Echocardiogram has been performed.  [name] [name] 02/10/2020, 11:18 AM

## 2020-02-10 NOTE — Telephone Encounter (Signed)
Returned call to pt and she has been made aware of her echo results and she verbalized understanding.

## 2020-02-10 NOTE — Telephone Encounter (Signed)
New message   Patient is returning call for results of echo. Please call.

## 2020-02-11 ENCOUNTER — Telehealth: Payer: Self-pay | Admitting: Cardiology

## 2020-02-11 NOTE — Telephone Encounter (Signed)
   Primary Cardiologist: Dorris Carnes, MD  Chart reviewed as part of pre-operative protocol coverage. Given past medical history and time since last visit, based on ACC/AHA guidelines, [name] [name] would be at acceptable risk for the planned procedure without further cardiovascular testing.  She was seen in Office 02/09/20 and echo was completed with no RWMA and no AS.  She may hold asprin 5 - 7 days prior to procedure and resume post op.    I will route this recommendation to the requesting party via Epic fax function and remove from pre-op pool.  Please call with questions.  Cecilie Kicks, NP 02/11/2020, 8:06 AM

## 2020-03-01 ENCOUNTER — Ambulatory Visit: Payer: Medicare PPO | Admitting: Family Medicine

## 2020-03-01 ENCOUNTER — Other Ambulatory Visit: Payer: Self-pay

## 2020-03-01 ENCOUNTER — Encounter: Payer: Self-pay | Admitting: Family Medicine

## 2020-03-01 VITALS — BP 140/85 | HR 115 | Temp 97.8°F | Ht <= 58 in | Wt 119.2 lb

## 2020-03-01 DIAGNOSIS — S0101XA Laceration without foreign body of scalp, initial encounter: Secondary | ICD-10-CM | POA: Insufficient documentation

## 2020-03-01 DIAGNOSIS — E039 Hypothyroidism, unspecified: Secondary | ICD-10-CM

## 2020-03-01 DIAGNOSIS — S0101XD Laceration without foreign body of scalp, subsequent encounter: Secondary | ICD-10-CM

## 2020-03-01 HISTORY — DX: Laceration without foreign body of scalp, initial encounter: S01.01XA

## 2020-03-01 MED ORDER — LEVOTHYROXINE SODIUM 25 MCG PO TABS: 25.0000 ug | ORAL_TABLET | Freq: Every day | ORAL | 2 refills | Status: DC

## 2020-03-01 NOTE — Progress Notes (Signed)
Established Patient Office Visit  Subjective:  Patient ID: [name] [name], female    DOB: Apr 06, 1941  Age: 79 y.o. MRN: [id number]  CC:  Chief Complaint  Patient presents with  . Hospitalization Follow-up    Injury to the head on 01/19/20. Still ahve a dent in the top of head, only hurts when press o it. Still have occ headaches  . Knee Pain    would like to discuss knee replacement for the knee pain.   01/19/20 IMPRESSION: CT Mild symmetric frontal atrophy bilaterally. Ventricles and sulci elsewhere appear unremarkable for age.  No intracranial mass or hemorrhage. Brain parenchyma appears unremarkable.  There are foci of arterial vascular calcification.   HPI OCEA BROTMAN presents for follow up from ER for head trauma. Pt with 2 evaluations for trauma to the head. Pt was first evaluated in the er and offered a CT for evaluation. Pt declined -laceration treated with dermabond. Pt with re-evaluation when headache onset-CT negative for bleed.   Hypothyroid-needs refill-TSH normal 6/20  Past Medical History:  Diagnosis Date  . Arthritis   . Bipolar affective disorder ([place])   . Cataract   . Chronic back pain   . Chronic fatigue   . Dizziness   . Dyslipidemia   . Fibromyalgia   . GERD (gastroesophageal reflux disease)   . H/O: hysterectomy   . Hx of adenomatous colonic polyps 2005  . Hyperlipidemia   . Hypertension   . Knee pain   . Non-obstructive CAD    a. 08/2015 Cath: LM 40, LCX small, 30ost, 2m, OM2 small, RCA nl/small, EF 55-65%.  . Osteopenia   . Sciatica     Past Surgical History:  Procedure Laterality Date  . ABDOMINAL HYSTERECTOMY  1994   TAH- DUB, BSO  . [place] bunionectomy     bilateral  . BREAST BIOPSY Left 1989  . BREAST LUMPECTOMY    . BUNIONECTOMY Left 2005  . CARDIAC CATHETERIZATION  2010   +CAD  . CARDIAC CATHETERIZATION N/A 08/19/2015   Procedure: Left Heart Cath and Coronary Angiography;  Surgeon: Belva Crome, MD;  Location: [facility];  Service: Cardiovascular;  Laterality: N/A;  . 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  Remainder of colonic mucosa was normal  . COLONOSCOPY WITH ESOPHAGOGASTRODUODENOSCOPY (EGD) N/A 02/01/2014   [id number] erosive reflux/gastric polyp/normal rectum/scattered left sided diverticula, normal distal TI. gastric bx negative, fundic gland polyp. next TCS 01/2019.  Marland Kitchen CYSTECTOMY     pilonidial cyst  . ESOPHAGOGASTRODUODENOSCOPY     followed by colonoscopy with snare polypectomy  . right knee replacement  02/2013  . SHOULDER ARTHROSCOPY Left 2005  . SHOULDER ARTHROSCOPY Right   . SPINAL FUSION    . SPINAL FUSION  2004   L4-L5  . TOTAL HIP ARTHROPLASTY Right 06/2006  . TOTAL HIP ARTHROPLASTY  8/12  . TOTAL SHOULDER REPLACEMENT Left 08/2005    Family History  Problem Relation Age of Onset  . Heart disease Mother   . Heart attack Mother   . Heart disease Father   . Heart attack Father   . Stroke Sister   . Stroke Maternal Grandfather   . Cancer Paternal Grandmother        unknown primary  . Colon cancer Neg Hx     Social History   Socioeconomic History  . Marital status: Widowed    Spouse name: Not on file  . Number of  children: Not on file  . Years of education: Not on file  . Highest education level: Not on file  Occupational History  . 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 Smoking status: Never Smoker  . Smokeless tobacco: Never Used  Substance and Sexual Activity  . Alcohol use: Yes    Alcohol/week: 7.0 standard drinks    Types: 7 Glasses of wine per week    Comment: almost daily  . Drug use: No  . Sexual activity: Not Currently    Partners: Male    Birth control/protection: Surgical    Comment: TAH  Other Topics Concern  . Not on file  Social History Narrative  . Not on file   Social Determinants of Health     Financial Resource Strain:   . Difficulty of Paying Living Expenses:   Food Insecurity:   . Worried About Charity fundraiser in the Last Year:   . Arboriculturist in the Last Year:   Transportation Needs:   . Film/video editor (Medical):   Marland Kitchen Lack of Transportation (Non-Medical):   Physical Activity:   . Days of Exercise per Week:   . Minutes of Exercise per Session:   Stress:   . Feeling of Stress :   Social Connections:   . Frequency of Communication with Friends and Family:   . Frequency of Social Gatherings with Friends and Family:   . Attends Religious Services:   . Active Member of Clubs or Organizations:   . Attends Archivist Meetings:   Marland Kitchen Marital Status:   Intimate Partner Violence:   . Fear of Current or Ex-Partner:   . Emotionally Abused:   Marland Kitchen Physically Abused:   . Sexually Abused:     Outpatient Medications Prior to Visit  Medication Sig Dispense Refill  . ALPRAZolam (XANAX) 0.5 MG tablet Take 0.25-0.5 mg by mouth daily as needed for anxiety.     Marland Kitchen aspirin 81 MG tablet Take 81 mg by mouth daily.    Marland Kitchen atorvastatin (LIPITOR) 40 MG tablet TAKE (1) TABLET BY MOUTH AT BEDTIME. 30 tablet 0  . bisacodyl (WOMENS LAXATIVE) 5 MG EC tablet Take 1 tablet by mouth daily as needed (constipation).     . Calcium Carb-Cholecalciferol (CALCIUM/VITAMIN D PO) Take 1 tablet by mouth daily.    . Cholecalciferol (VITAMIN D) 50 MCG (2000 UT) CAPS Take 2,000 Units by mouth daily.    . Cimetidine (TAGAMET PO) Take 1 tablet by mouth daily.     . DULoxetine (CYMBALTA) 60 MG capsule Take 120 mg by mouth daily.     Marland Kitchen estradiol (ESTRACE) 1 MG tablet TAKE 1/2 TABLET BY MOUTH DAILY. 30 tablet 11  . lamoTRIgine (LAMICTAL) 150 MG tablet Take 150 mg by mouth at bedtime.    Marland Kitchen levothyroxine (LEVOTHROID) 25 MCG tablet Take 1 tablet (25 mcg total) by mouth daily. 30 tablet 12  . losartan (COZAAR) 50 MG tablet Take 1 tablet (50 mg total) by mouth daily. 90 tablet 1  . meloxicam (MOBIC)  7.5 MG tablet Take 7.5 mg by mouth 2 (two) times daily.     . Multiple Vitamin (MULTIVITAMIN) tablet Take 1 tablet by mouth daily.      . niacin (SLO-NIACIN) 500 MG tablet Take 500 mg by mouth daily.     . nitroGLYCERIN (NITROSTAT) 0.4 MG SL tablet PLACE 1 TAB UNDER TONGUE EVERY 5 MIN IF NEEDED FOR  CHEST PAIN. MAY USE 3 TIMES.NO RELIEF CALL 911. 25 tablet 3  . Omega-3 Fatty Acids (FISH OIL) 1000 MG CAPS Take 1,000 mg by mouth daily.     . Probiotic Product (PROBIOTIC DAILY PO) Take 1 tablet by mouth daily.     . risperiDONE (RISPERDAL) 1 MG tablet Take 0.5 mg by mouth at bedtime.     . WELLBUTRIN XL 300 MG 24 hr tablet Take 300 mg by mouth daily.      No facility-administered medications prior to visit.    Allergies  Allergen Reactions  . Hydrocodone-Acetaminophen Hives and Itching    ROS Review of Systems  Constitutional: Negative for fatigue.  Endocrine:       TSH normal 6/20  Musculoskeletal:       Joint replacement pending  Neurological: Negative for dizziness, light-headedness and headaches.      Objective:    Physical Exam  Constitutional: She is oriented to person, place, and time. She appears well-developed and well-nourished.  HENT:  Head: Normocephalic and atraumatic.    Laceration well healed with no edema or eryth  Neurological: She is alert and oriented to person, place, and time.  Psychiatric: She has a normal mood and affect. Her behavior is normal.    BP 140/85 (BP Location: Right Arm, Patient Position: Sitting, Cuff Size: Normal)   Pulse (!) 115   Temp 97.8 F (36.6 C) (Temporal)   Ht 4\' 8"  (1.422 m)   Wt 119 lb 3.2 oz (54.1 kg)   LMP 02/10/1993   SpO2 97%   BMI 26.72 kg/m  Wt Readings from Last 3 Encounters:  03/01/20 119 lb 3.2 oz (54.1 kg)  02/09/20 118 lb 6.4 oz (53.7 kg)  01/19/20 119 lb 0.8 oz (54 kg)     Health Maintenance Due  Topic Date Due  . COVID-19 Vaccine (1) Never done    Lab Results  Component Value Date   TSH 4.71  04/17/2019   Lab Results  Component Value Date   WBC 7.9 04/17/2019   HGB 14.0 04/17/2019   HCT 41 04/17/2019   MCV 95.6 08/18/2015   PLT 325 04/17/2019   Lab Results  Component Value Date   NA 135 (A) 04/17/2019   K 4.7 04/17/2019   CO2 26 (A) 04/17/2019   GLUCOSE 102 (H) 08/18/2015   BUN 10 04/17/2019   CREATININE 0.8 04/17/2019   BILITOT 0.2 (L) 07/27/2013   ALKPHOS 94 04/17/2019   AST 32 04/17/2019   ALT 30 04/17/2019   PROT 6.4 07/27/2013   ALBUMIN 4.2 04/17/2019   CALCIUM 9.7 04/17/2019   ANIONGAP 10 08/18/2015   Lab Results  Component Value Date   CHOL 171 04/17/2019   Lab Results  Component Value Date   HDL 67 04/17/2019   Lab Results  Component Value Date   LDLCALC 79 04/17/2019   Lab Results  Component Value Date   TRIG 127 04/17/2019   Lab Results  Component Value Date   CHOLHDL 1.9 05/08/2016   Lab Results  Component Value Date   HGBA1C 5.4 04/17/2019      Assessment & Plan:   1. Hypothyroidism - levothyroxine (SYNTHROID) 25 MCG tablet; Take 1 tablet (25 mcg total) by mouth daily.  Dispense: 30 tablet; Refill: 2  2. Laceration of scalp, subsequent encounter Evaluated by ER x 2, reviewed CT with pt-well healed  Follow-up:     Mrk Buzby Hannah Beat, MD

## 2020-03-01 NOTE — Patient Instructions (Addendum)
  labwork recommended    If you have lab work done today you will be contacted with your lab results within the next 2 weeks.  If you have not heard from [place] then please contact us. The fastest way to get your results is to register for My Chart.   IF you received an x-ray today, you will receive an invoice from [facility]. Please contact [facility] at [phone] with questions or concerns regarding your invoice.   IF you received labwork today, you will receive an invoice from [facility]. Please contact [facility] at [phone] with questions or concerns regarding your invoice.   Our billing staff will not be able to assist you with questions regarding bills from these companies.  You will be contacted with the lab results as soon as they are available. The fastest way to get your results is to activate your My Chart account. Instructions are located on the last page of this paperwork. If you have not heard from [place] regarding the results in 2 weeks, please contact this office.

## 2020-03-14 ENCOUNTER — Other Ambulatory Visit: Payer: Self-pay

## 2020-04-06 ENCOUNTER — Other Ambulatory Visit: Payer: Self-pay | Admitting: Family

## 2020-04-08 ENCOUNTER — Other Ambulatory Visit: Payer: Self-pay | Admitting: Physician Assistant

## 2020-04-13 NOTE — Progress Notes (Signed)
[place], [place] [id number]  [place] [id number] Phone: [phone] Fax: [phone]      Your procedure is scheduled on April 18, 2020.  Report to [facility] Main Entrance "A" at 5:30 A.M., and check in at the Admitting office.  Call this number if you have problems the morning of surgery:  [phone]  Call [phone] if you have any questions prior to your surgery date Monday-Friday 8am-4pm    Remember:  Do not eat after midnight the night before your surgery  You may drink clear liquids until 4:15 the morning of your surgery.   Clear liquids allowed are: Water, Non-Citrus Juices (without pulp), Carbonated Beverages, Clear Tea, Black Coffee Only, and Gatorade  Please complete your PRE-SURGERY ENSURE that was provided to you by 4:15 the morning of surgery.  Please, if able, drink it in one setting. DO NOT SIP.    Take these medicines the morning of surgery with A SIP OF WATER: Cimetidine (TAGAMET PO) DULoxetine (CYMBALTA) estradiol (ESTRACE) levothyroxine (SYNTHROID)  WELLBUTRIN XL  As needed: ALPRAZolam (XANAX) nitroGLYCERIN (NITROSTAT) - for chest pain  As of today, STOP taking any Aspirin (unless otherwise instructed by your surgeon) and Aspirin containing products,, Mobic (Meloxicam),  Aleve, Naproxen, Ibuprofen, Motrin, Advil, Goody's, BC's, all herbal medications, fish oil, and all vitamins.                      Do not wear jewelry, make up, or nail polish            Do not wear lotions, powders, perfumes or deodorant.            Do not shave 48 hours prior to surgery.              Do not bring valuables to the hospital.            [facility] is not responsible for any belongings or valuables.  Do NOT Smoke (Tobacco/Vapping) or drink Alcohol 24 hours prior to your procedure If you use a CPAP at night, you may bring all equipment for your overnight stay.   Contacts, glasses, dentures or bridgework may  not be worn into surgery.      For patients admitted to the hospital, discharge time will be determined by your treatment team.   Patients discharged the day of surgery will not be allowed to drive home, and someone needs to stay with them for 24 hours.    Special instructions:   [facility]- Preparing For Surgery  Before surgery, you can play an important role. Because skin is not sterile, your skin needs to be as free of germs as possible. You can reduce the number of germs on your skin by washing with CHG (chlorahexidine gluconate) Soap before surgery.  CHG is an antiseptic cleaner which kills germs and bonds with the skin to continue killing germs even after washing.    Oral Hygiene is also important to reduce your risk of infection.  Remember - BRUSH YOUR TEETH THE MORNING OF SURGERY WITH YOUR REGULAR TOOTHPASTE  Please do not use if you have an allergy to CHG or antibacterial soaps. If your skin becomes reddened/irritated stop using the CHG.  Do not shave (including legs and underarms) for at least 48 hours prior to first CHG shower. It is OK to shave your face.  Please follow these instructions carefully.   1. Shower the NIGHT BEFORE SURGERY and the [facility]  OF SURGERY with CHG Soap.   2. If you chose to wash your hair, wash your hair first as usual with your normal shampoo.  3. After you shampoo, rinse your hair and body thoroughly to remove the shampoo.  4. Use CHG as you would any other liquid soap. You can apply CHG directly to the skin and wash gently with a scrungie or a clean washcloth.   5. Apply the CHG Soap to your body ONLY FROM THE NECK DOWN.  Do not use on open wounds or open sores. Avoid contact with your eyes, ears, mouth and genitals (private parts). Wash Face and genitals (private parts)  with your normal soap.   6. Wash thoroughly, paying special attention to the area where your surgery will be performed.  7. Thoroughly rinse your body with warm water from the  neck down.  8. DO NOT shower/wash with your normal soap after using and rinsing off the CHG Soap.  9. Pat yourself dry with a CLEAN TOWEL.  10. Wear CLEAN PAJAMAS to bed the night before surgery, wear comfortable clothes the morning of surgery  11. Place CLEAN SHEETS on your bed the night of your first shower and DO NOT SLEEP WITH PETS.   Day of Surgery:   Do not apply any deodorants/lotions.  Please wear clean clothes to the hospital/surgery center.   Remember to brush your teeth WITH YOUR REGULAR TOOTHPASTE.   Please read over the following fact sheets that you were given.

## 2020-04-14 ENCOUNTER — Encounter (HOSPITAL_COMMUNITY)
Admission: RE | Admit: 2020-04-14 | Discharge: 2020-04-14 | Disposition: A | Discharge status: Home / Self Care | Payer: Medicare PPO | Source: Ambulatory Visit | Attending: Family | Admitting: Family

## 2020-04-14 ENCOUNTER — Other Ambulatory Visit (HOSPITAL_COMMUNITY)
Admission: RE | Admit: 2020-04-14 | Discharge: 2020-04-14 | Disposition: A | Discharge status: Home / Self Care | Payer: Medicare PPO | Source: Ambulatory Visit | Attending: Orthopaedic Surgery | Admitting: Orthopaedic Surgery

## 2020-04-14 ENCOUNTER — Other Ambulatory Visit: Payer: Self-pay

## 2020-04-14 ENCOUNTER — Encounter (HOSPITAL_COMMUNITY): Payer: Self-pay

## 2020-04-14 ENCOUNTER — Encounter (HOSPITAL_COMMUNITY)
Admission: RE | Admit: 2020-04-14 | Discharge: 2020-04-14 | Disposition: A | Discharge status: Home / Self Care | Payer: Medicare PPO | Source: Ambulatory Visit | Attending: Orthopaedic Surgery | Admitting: Orthopaedic Surgery

## 2020-04-14 DIAGNOSIS — Z01811 Encounter for preprocedural respiratory examination: Secondary | ICD-10-CM

## 2020-04-14 DIAGNOSIS — Z20822 Contact with and (suspected) exposure to covid-19: Secondary | ICD-10-CM | POA: Diagnosis not present

## 2020-04-14 DIAGNOSIS — Z01818 Encounter for other preprocedural examination: Secondary | ICD-10-CM | POA: Diagnosis not present

## 2020-04-14 HISTORY — DX: Personal history of urinary calculi: Z87.442

## 2020-04-14 HISTORY — DX: Constipation, unspecified: K59.00

## 2020-04-14 HISTORY — DX: Depression, unspecified: F32.A

## 2020-04-14 HISTORY — DX: Hypothyroidism, unspecified: E03.9

## 2020-04-14 HISTORY — DX: Other specified postprocedural states: Z98.890

## 2020-04-14 HISTORY — DX: Other specified postprocedural states: R11.2

## 2020-04-14 HISTORY — DX: Anemia, unspecified: D64.9

## 2020-04-14 HISTORY — DX: Other complications of anesthesia, initial encounter: T88.59XA

## 2020-04-14 HISTORY — DX: Anxiety disorder, unspecified: F41.9

## 2020-04-14 LAB — CBC
HCT: 43.2 % (ref 36.0–46.0)
Hemoglobin: 13.8 g/dL (ref 12.0–15.0)
MCH: 32 pg (ref 26.0–34.0)
MCHC: 31.9 g/dL (ref 30.0–36.0)
MCV: 100.2 fL — ABNORMAL HIGH (ref 80.0–100.0)
Platelets: 276 10*3/uL (ref 150–400)
RBC: 4.31 MIL/uL (ref 3.87–5.11)
RDW: 13.6 % (ref 11.5–15.5)
WBC: 8.6 10*3/uL (ref 4.0–10.5)
nRBC: 0 % (ref 0.0–0.2)

## 2020-04-14 LAB — URINALYSIS, ROUTINE W REFLEX MICROSCOPIC
Bilirubin Urine: NEGATIVE
Glucose, UA: NEGATIVE mg/dL
Hgb urine dipstick: NEGATIVE
Ketones, ur: NEGATIVE mg/dL
Leukocytes,Ua: NEGATIVE
Nitrite: NEGATIVE
Protein, ur: NEGATIVE mg/dL
Specific Gravity, Urine: 1.006 (ref 1.005–1.030)
pH: 6 (ref 5.0–8.0)

## 2020-04-14 LAB — COMPREHENSIVE METABOLIC PANEL
ALT: 36 U/L (ref 0–44)
AST: 37 U/L (ref 15–41)
Albumin: 4.1 g/dL (ref 3.5–5.0)
Alkaline Phosphatase: 67 U/L (ref 38–126)
Anion gap: 13 (ref 5–15)
BUN: 15 mg/dL (ref 8–23)
CO2: 26 mmol/L (ref 22–32)
Calcium: 9.7 mg/dL (ref 8.9–10.3)
Chloride: 96 mmol/L — ABNORMAL LOW (ref 98–111)
Creatinine, Ser: 0.8 mg/dL (ref 0.44–1.00)
GFR calc Af Amer: >60 mL/min (ref >60)
GFR calc non Af Amer: >60 mL/min (ref >60)
Glucose, Bld: 95 mg/dL (ref 70–99)
Potassium: 3.6 mmol/L (ref 3.5–5.1)
Sodium: 135 mmol/L (ref 135–145)
Total Bilirubin: 0.9 mg/dL (ref 0.3–1.2)
Total Protein: 7.3 g/dL (ref 6.5–8.1)

## 2020-04-14 LAB — SURGICAL PCR SCREEN
MRSA, PCR: NEGATIVE
Staphylococcus aureus: NEGATIVE

## 2020-04-14 LAB — SARS CORONAVIRUS 2 (TAT 6-24 HRS): SARS Coronavirus 2: NEGATIVE

## 2020-04-14 LAB — PROTIME-INR
INR: 0.9 (ref 0.8–1.2)
Prothrombin Time: 11.5 seconds (ref 11.4–15.2)

## 2020-04-14 LAB — APTT: aPTT: 27 seconds (ref 24–36)

## 2020-04-14 NOTE — Progress Notes (Signed)
PCP - Dr. Benny Lennert Cardiologist - Dr. Dorris Carnes Cecilie Kicks, NP) - cardiac clearance noted in Epic on 02/11/20  Chest x-ray - today EKG - 02/09/20 Stress Test - denies ECHO - 02/10/20 Cardiac Cath -08/19/2015  Aspirin Instructions:  Hold 5 - 7 days prior to surgery. Last dose 04/13/20  ERAS Protcol - yes PRE-SURGERY Ensure - yes  COVID TEST- today  Anesthesia review: No  Patient denies shortness of breath, fever, cough and chest pain at PAT appointment   All instructions explained to the patient, with a verbal understanding of the material. Patient agrees to go over the instructions while at home for a better understanding. Patient also instructed to self quarantine after being tested for COVID-19. The opportunity to ask questions was provided.  '

## 2020-04-14 NOTE — Pre-Procedure Instructions (Signed)
[name] [name]  04/14/2020    Your procedure is scheduled on Monday, April 18, 2020 at 7:15 AM.    Report to [facility] Entrance "A" Admitting Office at 5:30 AM.   Call this number if you have problems the morning of surgery: [phone]   Questions prior to day of surgery, please call [phone] between 8 & 4 PM.   Remember:  Do not eat food after midnight.  You may drink clear liquids until 4:30AM.  Clear liquids allowed are:  Water, Juice (non-citric and without pulp - diabetics please choose diet or no sugar options), Carbonated beverages - (diabetics please choose diet or no sugar options), Clear Tea, Black Coffee only (no creamer, milk or cream including half and half) and Gatorade (diabetics please choose diet or no sugar options)   Drink the Pre-Surgery Ensure between 4:15 AM and 4:30 AM. This will be the last liquid that you will drink prior to surgery.    Take these medicines the morning of surgery with A SIP OF WATER: Cimetidine (Tagamet), Duloxetine (Cymbalta), Estradiol (Estrace), Levothyroxine (Synthroid), Wellbutrin  Stop Aspirin as instructed by physician/surgeon. Stop NSAIDS (Mobic, Meloxicam, Ibuprofen, Aleve, etc), Multivitamins and Fish Oil as of today prior to surgery. Do not use any other Aspirin containing products prior to surgery.    Do not wear jewelry, make-up or nail polish.  Do not wear lotions, powders, perfumes or deodorant.  Do not shave 48 hours prior to surgery.    Do not bring valuables to the hospital.  [facility] is not responsible for any belongings or valuables.  Contacts, dentures or bridgework may not be worn into surgery.  Leave your suitcase in the car.  After surgery it may be brought to your room.  For patients admitted to the hospital, discharge time will be determined by your treatment team.  Patients discharged the day of surgery will not be allowed to drive home.   [name] [name] - Preparing for Surgery  Before surgery, you  can play an important role.  Because skin is not sterile, your skin needs to be as free of germs as possible.  You can reduce the number of germs on you skin by washing with CHG (chlorahexidine gluconate) soap before surgery.  CHG is an antiseptic cleaner which kills germs and bonds with the skin to continue killing germs even after washing.  Oral Hygiene is also important in reducing the risk of infection.  Remember to brush your teeth with your regular toothpaste the morning of surgery.  Please DO NOT use if you have an allergy to CHG or antibacterial soaps.  If your skin becomes reddened/irritated stop using the CHG and inform your nurse when you arrive at Short Stay.  Do not shave (including legs and underarms) for at least 48 hours prior to the first CHG shower.  You may shave your face.  Please follow these instructions carefully:   1.  Shower with CHG Soap the night before surgery and the morning of Surgery.  2.  If you choose to wash your hair, wash your hair first as usual with your normal shampoo.  3.  After you shampoo, rinse your hair and body thoroughly to remove the shampoo. 4.  Use CHG as you would any other liquid soap.  You can apply chg directly to the skin and wash gently with a      scrungie or washcloth.           5.  Apply the  CHG Soap to your body ONLY FROM THE NECK DOWN.   Do not use on open wounds or open sores. Avoid contact with your eyes, ears, mouth and genitals (private parts).  Wash genitals (private parts) with your normal soap - do this prior to using CHG soap.  6.  Wash thoroughly, paying special attention to the area where your surgery will be performed.  7.  Thoroughly rinse your body with warm water from the neck down.  8.  DO NOT shower/wash with your normal soap after using and rinsing off the CHG Soap.  9.  Pat yourself dry with a clean towel.            10.  Wear clean pajamas.            11.  Place clean sheets on your bed the night of your first shower  and do not sleep with pets.  Day of Surgery  Shower as above. Do not apply any lotions/deodorants the morning of surgery.   Please wear clean clothes to the hospital. Remember to brush your teeth with toothpaste.  Please read over the fact sheets that you were given.

## 2020-04-15 ENCOUNTER — Telehealth: Payer: Self-pay | Admitting: *Deleted

## 2020-04-15 MED ORDER — TRANEXAMIC ACID 1000 MG/10ML IV SOLN
2000.0000 mg | INTRAVENOUS | Status: AC
  Administered 2020-04-18: 2000 mg via TOPICAL
  Filled 2020-04-15: qty 20

## 2020-04-15 MED ORDER — BUPIVACAINE LIPOSOME 1.3 % IJ SUSP
20.0000 mL | Freq: Once | INTRAMUSCULAR | Status: DC
  Filled 2020-04-15: qty 20

## 2020-04-15 NOTE — Telephone Encounter (Signed)
Ortho bundle pre-op call completed. 

## 2020-04-15 NOTE — Care Plan (Signed)
RNCM call to patient to discuss her upcoming Left TKA with Dr. Erlinda Hong on Monday, 04/18/20. She is an Ortho bundle patient through [facility]. She is agreeable to case management. She noted that she lives alone and has no one that can help as her daughter lives in [place] area. She has arranged to go to [facility] following a hospital stay. CM will reach out to [facility] as well. CPM ordered through [facility] to be delivered to [facility]. Will continue to follow for CM needs.

## 2020-04-17 ENCOUNTER — Encounter (HOSPITAL_COMMUNITY): Payer: Self-pay

## 2020-04-17 ENCOUNTER — Other Ambulatory Visit: Payer: Self-pay

## 2020-04-17 ENCOUNTER — Other Ambulatory Visit: Payer: Self-pay | Admitting: Orthopaedic Surgery

## 2020-04-17 ENCOUNTER — Emergency Department (HOSPITAL_COMMUNITY)
Admission: EM | Admit: 2020-04-17 | Discharge: 2020-04-17 | Disposition: A | Discharge status: Home / Self Care | Payer: Medicare PPO | Attending: Emergency Medicine | Admitting: Emergency Medicine

## 2020-04-17 ENCOUNTER — Emergency Department (HOSPITAL_COMMUNITY): Payer: Medicare PPO

## 2020-04-17 DIAGNOSIS — Z96641 Presence of right artificial hip joint: Secondary | ICD-10-CM | POA: Diagnosis not present

## 2020-04-17 DIAGNOSIS — E039 Hypothyroidism, unspecified: Secondary | ICD-10-CM | POA: Diagnosis not present

## 2020-04-17 DIAGNOSIS — I10 Essential (primary) hypertension: Secondary | ICD-10-CM | POA: Insufficient documentation

## 2020-04-17 DIAGNOSIS — M1712 Unilateral primary osteoarthritis, left knee: Secondary | ICD-10-CM

## 2020-04-17 DIAGNOSIS — Y999 Unspecified external cause status: Secondary | ICD-10-CM | POA: Diagnosis not present

## 2020-04-17 DIAGNOSIS — W182XXA Fall in (into) shower or empty bathtub, initial encounter: Secondary | ICD-10-CM | POA: Diagnosis not present

## 2020-04-17 DIAGNOSIS — S0990XA Unspecified injury of head, initial encounter: Secondary | ICD-10-CM

## 2020-04-17 DIAGNOSIS — Y92002 Bathroom of unspecified non-institutional (private) residence single-family (private) house as the place of occurrence of the external cause: Secondary | ICD-10-CM | POA: Insufficient documentation

## 2020-04-17 DIAGNOSIS — M542 Cervicalgia: Secondary | ICD-10-CM | POA: Diagnosis not present

## 2020-04-17 DIAGNOSIS — R519 Headache, unspecified: Secondary | ICD-10-CM | POA: Diagnosis not present

## 2020-04-17 DIAGNOSIS — Y93E1 Activity, personal bathing and showering: Secondary | ICD-10-CM | POA: Insufficient documentation

## 2020-04-17 DIAGNOSIS — S161XXA Strain of muscle, fascia and tendon at neck level, initial encounter: Secondary | ICD-10-CM | POA: Insufficient documentation

## 2020-04-17 DIAGNOSIS — W19XXXA Unspecified fall, initial encounter: Secondary | ICD-10-CM

## 2020-04-17 HISTORY — DX: Unilateral primary osteoarthritis, left knee: M17.12

## 2020-04-17 NOTE — ED Provider Notes (Signed)
Emergency Department Provider Note   I have reviewed the triage vital signs and the nursing notes.   HISTORY  Chief Complaint Fall   HPI [name] [name] is a 79 y.o. female with past medical history reviewed below presents to the emergency department after slip in the bathtub yesterday with head injury.  Patient was in the bathtub yesterday evening and got up onto her hands and knees trying to stand up to get out of the tub when she slipped and fell forward striking her right forehead on the back of the bathtub.  There is no loss of consciousness.  She was able to ultimately get out of the tub and went to bed.  She did develop a headache after the event but headache continued this morning and she is experiencing some pressure behind the right eye.  No vision change.  No pain with extraocular movement.  She feels pressure in the face on the right as well.  No pain in the arms or legs.  She has been ambulatory with her normal amount of discomfort in the left knee.  She is due to have knee replacement tomorrow morning.  She is not anticoagulated.    Past Medical History:  Diagnosis Date  . Anemia   . Anxiety   . Arthritis   . Bipolar affective disorder ([place])   . Cataract   . Chronic back pain   . Chronic fatigue   . Complication of anesthesia   . Constipation   . Depression   . Dizziness   . Dyslipidemia   . Fibromyalgia   . GERD (gastroesophageal reflux disease)   . H/O: hysterectomy   . History of kidney stones   . Hx of adenomatous colonic polyps 2005  . Hyperlipidemia   . Hypertension   . Hypothyroidism   . Knee pain   . Non-obstructive CAD    a. 08/2015 Cath: LM 40, LCX small, 30ost, 33m, OM2 small, RCA nl/small, EF 55-65%.  . Osteopenia   . PONV (postoperative nausea and vomiting)   . Sciatica     Patient Active Problem List   Diagnosis Date Noted  . Scalp laceration 03/01/2020  . Abdominal pain, epigastric 08/14/2019  . Midsternal chest pain 08/19/2015  .  Bipolar disorder ([place])   . Hypothyroidism 08/04/2015    Class: Diagnosis of  . Chronic cough 11/23/2014  . Fecal incontinence 11/23/2014  . Constipation 09/22/2013  . H/O adenomatous polyp of colon 09/22/2013  . S/P total knee arthroplasty 04/27/2013  . Knee pain 04/27/2013  . Muscle weakness (generalized) 04/27/2013  . Chronic fatigue 03/05/2013  . Chronic pain syndrome 03/05/2013  . Osteoarthritis of both knees 03/05/2013  . DJD (degenerative joint disease) of pelvis 03/05/2013  . Vitamin D deficiency 03/05/2013  . Surgery, elective 03/05/2013  . Insomnia 03/05/2013  . Hot flashes 03/05/2013  . History of gastroesophageal reflux (GERD) 03/05/2013  . History of back surgery 03/05/2013  . H/O anxiety disorder 03/05/2013  . Aftercare following joint replacement 08/28/2012  . History of total shoulder replacement 08/28/2012  . Dislocation of hip joint prosthesis ([place]) 02/04/2012  . HTN (hypertension) 10/15/2011  . Degenerative joint disease of pelvic region 09/10/2011  . H/O bilateral hip replacements 07/26/2011  . Right hip pain 07/26/2011  . Difficulty in walking(719.7) 07/26/2011  . Hyperlipidemia 05/11/2009  . BIPOLAR AFFECTIVE DISORDER 05/11/2009  . Coronary artery disease 05/11/2009  . GERD 05/11/2009  . Fibromyalgia 05/11/2009  . DIZZINESS 05/11/2009  . Chest pain 05/11/2009  Past Surgical History:  Procedure Laterality Date  . ABDOMINAL HYSTERECTOMY  1994   TAH- DUB, BSO  . [place] bunionectomy     bilateral  . BREAST BIOPSY Left 1989  . BREAST LUMPECTOMY    . BUNIONECTOMY Left 2005  . CARDIAC CATHETERIZATION  2010   +CAD  . CARDIAC CATHETERIZATION N/A 08/19/2015   Procedure: Left Heart Cath and Coronary Angiography;  Surgeon: Belva Crome, MD;  Location: [facility];  Service: Cardiovascular;  Laterality: N/A;  . 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  Remainder  of colonic mucosa was normal  . COLONOSCOPY WITH ESOPHAGOGASTRODUODENOSCOPY (EGD) N/A 02/01/2014   DQQ:IWLN erosive reflux/gastric polyp/normal rectum/scattered left sided diverticula, normal distal TI. gastric bx negative, fundic gland polyp. next TCS 01/2019.  Marland Kitchen CYSTECTOMY     pilonidial cyst  . ESOPHAGOGASTRODUODENOSCOPY     followed by colonoscopy with snare polypectomy  . EYE SURGERY Bilateral    cataract surgery with lens implants  . REVISION TOTAL HIP ARTHROPLASTY Right   . right knee replacement  02/2013  . SHOULDER ARTHROSCOPY Left 2005  . SHOULDER ARTHROSCOPY Right   . SPINAL FUSION    . SPINAL FUSION  2004   L4-L5  . TOTAL HIP ARTHROPLASTY Right 06/2006  . TOTAL HIP ARTHROPLASTY  8/12  . TOTAL SHOULDER REPLACEMENT Left 08/2005    Allergies Hydrocodone-acetaminophen  Family History  Problem Relation Age of Onset  . Heart disease Mother   . Heart attack Mother   . Heart disease Father   . Heart attack Father   . Stroke Sister   . Stroke Maternal Grandfather   . Cancer Paternal Grandmother        unknown primary  . Colon cancer Neg Hx     Social History Social History   Tobacco Use  . Smoking status: Never Smoker  . Smokeless tobacco: Never Used  Substance Use Topics  . Alcohol use: Yes    Alcohol/week: 7.0 standard drinks    Types: 7 Glasses of wine per week    Comment: almost daily  . Drug use: No    Review of Systems  Constitutional: No fever/chills Eyes: No visual changes. Positive pain behind the right eye.  ENT: No sore throat. Cardiovascular: Denies chest pain. Respiratory: Denies shortness of breath. Gastrointestinal: No abdominal pain.  No nausea, no vomiting.  No diarrhea.  No constipation. Genitourinary: Negative for dysuria. Musculoskeletal: Negative for back pain. Skin: Negative for rash. Neurological: Negative for focal weakness or numbness. Positive HA.   10-point ROS otherwise  negative.  ____________________________________________   PHYSICAL EXAM:  VITAL SIGNS: ED Triage Vitals  Enc Vitals Group     BP 04/17/20 1002 134/70     Pulse Rate 04/17/20 1002 96     Resp 04/17/20 1002 16     Temp 04/17/20 1002 97.8 F (36.6 C)     Temp Source 04/17/20 1002 Oral     SpO2 04/17/20 1002 97 %     Weight 04/17/20 1001 119 lb (54 kg)     Height 04/17/20 1001 4\' 8"  (1.422 m)   Constitutional: Alert and oriented. Well appearing and in no acute distress. Eyes: Conjunctivae are normal. PERRL. EOMI. No entrapment. No periorbital edema or bruising.  Head: Minimal area of bruising over the right forehead.  No laceration or significant hematoma.  Nose: No congestion/rhinnorhea. Mouth/Throat: Mucous membranes are moist.  Neck: No stridor.  No midline cervical spine  tenderness.  Patient does have some right paraspinal tenderness on exam.  Cardiovascular: Normal rate, regular rhythm. Good peripheral circulation. Grossly normal heart sounds.   Respiratory: Normal respiratory effort.  No retractions. Lungs CTAB. Gastrointestinal: No distention.  Musculoskeletal:  No gross deformities of extremities. Neurologic:  Normal speech and language. Skin:  Skin is warm, dry and intact. No rash noted.  ____________________________________________  RADIOLOGY  CT Head Wo Contrast  Result Date: 04/17/2020 CLINICAL DATA:  Headache and right-sided neck pain. Fell in the shower last night. EXAM: CT HEAD WITHOUT CONTRAST CT CERVICAL SPINE WITHOUT CONTRAST TECHNIQUE: Multidetector CT imaging of the head and cervical spine was performed following the standard protocol without intravenous contrast. Multiplanar CT image reconstructions of the cervical spine were also generated. COMPARISON:  Head CT, 01/19/2020. FINDINGS: CT HEAD FINDINGS Brain: No evidence of acute infarction, hemorrhage, hydrocephalus, extra-axial collection or mass lesion/mass effect. There is patchy bilateral white matter  hypoattenuation consistent with mild to moderate chronic microvascular ischemic change. Vascular: No hyperdense vessel or unexpected calcification. Skull: Normal. Negative for fracture or focal lesion. Sinuses/Orbits: Globes and orbits are unremarkable. Sinuses are clear. Other: None. CT CERVICAL SPINE FINDINGS Alignment: Grade 1 degenerative anterolisthesis of C2 on C3 and C6 on C7 and C7 on T1. No evidence of an acute vertebral subluxation. Skull base and vertebrae: No acute fracture. No primary bone lesion or focal pathologic process. Soft tissues and spinal canal: No prevertebral fluid or swelling. No visible canal hematoma. Disc levels: Mild loss of disc height at C2-C3 and C3-C4. Moderate loss of disc height at C4-C5. Marked loss of disc height at C5-C6, C6-C7 and C7-T1. There is endplate sclerosis and irregularity at C7-T1. There are also significant bilateral facet degenerative changes throughout the cervical spine. No convincing disc herniation. Upper chest: No acute findings.  Clear lung apices. Other: None. IMPRESSION: HEAD CT 1. No acute intracranial abnormalities. 2. No skull fracture. 3. Mild to moderate chronic microvascular ischemic change. CERVICAL CT 1. No fracture or acute finding. 2. Advanced disc and facet degenerative changes. Electronically Signed   By: Lajean Manes M.D.   On: 04/17/2020 10:41   CT Cervical Spine Wo Contrast  Result Date: 04/17/2020 CLINICAL DATA:  Headache and right-sided neck pain. Fell in the shower last night. EXAM: CT HEAD WITHOUT CONTRAST CT CERVICAL SPINE WITHOUT CONTRAST TECHNIQUE: Multidetector CT imaging of the head and cervical spine was performed following the standard protocol without intravenous contrast. Multiplanar CT image reconstructions of the cervical spine were also generated. COMPARISON:  Head CT, 01/19/2020. FINDINGS: CT HEAD FINDINGS Brain: No evidence of acute infarction, hemorrhage, hydrocephalus, extra-axial collection or mass lesion/mass effect.  There is patchy bilateral white matter hypoattenuation consistent with mild to moderate chronic microvascular ischemic change. Vascular: No hyperdense vessel or unexpected calcification. Skull: Normal. Negative for fracture or focal lesion. Sinuses/Orbits: Globes and orbits are unremarkable. Sinuses are clear. Other: None. CT CERVICAL SPINE FINDINGS Alignment: Grade 1 degenerative anterolisthesis of C2 on C3 and C6 on C7 and C7 on T1. No evidence of an acute vertebral subluxation. Skull base and vertebrae: No acute fracture. No primary bone lesion or focal pathologic process. Soft tissues and spinal canal: No prevertebral fluid or swelling. No visible canal hematoma. Disc levels: Mild loss of disc height at C2-C3 and C3-C4. Moderate loss of disc height at C4-C5. Marked loss of disc height at C5-C6, C6-C7 and C7-T1. There is endplate sclerosis and irregularity at C7-T1. There are also significant bilateral facet degenerative changes throughout the cervical  spine. No convincing disc herniation. Upper chest: No acute findings.  Clear lung apices. Other: None. IMPRESSION: HEAD CT 1. No acute intracranial abnormalities. 2. No skull fracture. 3. Mild to moderate chronic microvascular ischemic change. CERVICAL CT 1. No fracture or acute finding. 2. Advanced disc and facet degenerative changes. Electronically Signed   By: Lajean Manes M.D.   On: 04/17/2020 10:41    ____________________________________________   PROCEDURES  Procedure(s) performed:   Procedures  None  ____________________________________________   INITIAL IMPRESSION / ASSESSMENT AND PLAN / ED COURSE  Pertinent labs & imaging results that were available during my care of the patient were reviewed by me and considered in my medical decision making (see chart for details).   Patient presents emergency department for mechanical slip and fall in the bathtub yesterday with head injury.  No loss of consciousness.  No hard signs to suspect  basilar skull fracture.  Given patient's age and persistent headache with pressure behind the right eye plan for CT imaging of the head.  Patient also having some cervical spine tenderness which is mostly right paraspinal but will obtain CT C-spine as well.   CT imaging of the head and cervical spine reviewed with no acute fractures or bleeding.  Cautioned on possible mild concussion symptoms but will follow closely with her primary care doctor if those continue and seek referral to neurology at that time.  She will mention the fall to her operative team in the morning but advised that she continue to prepare for surgery as instructed by her orthopedist.  ____________________________________________  FINAL CLINICAL IMPRESSION(S) / ED DIAGNOSES  Final diagnoses:  Fall, initial encounter  Injury of head, initial encounter  Strain of neck muscle, initial encounter    Note:  This document was prepared using Dragon voice recognition software and may include unintentional dictation errors.  Nanda Quinton, MD, [facility]    Adyan Palau, [facility], MD 04/17/20 1053

## 2020-04-17 NOTE — ED Triage Notes (Signed)
Pt says slipped in the tub last night and hit head on tub.  C/O headache since last night and woke up with pressure behind her r eye.  Pt also says r side of neck hurts.  PT has bruise above r eye.   PT says was a little nauseated this morning, no vomiting.

## 2020-04-17 NOTE — Discharge Instructions (Signed)

## 2020-04-17 NOTE — Discharge Instructions (Signed)
You were seen in the emergency department today with headache and neck pain after fall yesterday.  Your CT scans did not show any findings such as fractures or bleeding.  Please continue your home medications and make sure to mention the fall at your preop appointment tomorrow.  You may have sustained a mild concussion if symptoms continue such as headache, nausea, trouble focusing you may require PCP follow-up and neurology referral in the future.  You may take over-the-counter pain medications and use topical pain medicine such as Voltaren for your neck discomfort.  You may also consider applying a heating pad to the neck area.

## 2020-04-17 NOTE — Anesthesia Preprocedure Evaluation (Addendum)
Anesthesia Evaluation  Patient identified by MRN, date of birth, ID band Patient awake    Reviewed: Allergy & Precautions, H&P , NPO status , Patient's Chart, lab work & pertinent test results  History of Anesthesia Complications (+) PONV and AWARENESS UNDER ANESTHESIA  Airway Mallampati: II  TM Distance: >3 FB Neck ROM: Full    Dental no notable dental hx. (+) Teeth Intact, Dental Advisory Given   Pulmonary neg pulmonary ROS,    Pulmonary exam normal breath sounds clear to auscultation       Cardiovascular Exercise Tolerance: Good hypertension, Pt. on medications  Rhythm:Regular Rate:Normal     Neuro/Psych Anxiety Depression Bipolar Disorder negative neurological ROS     GI/Hepatic Neg liver ROS, GERD  ,  Endo/Other  Hypothyroidism   Renal/GU negative Renal ROS  negative genitourinary   Musculoskeletal  (+) Arthritis , Osteoarthritis,  Fibromyalgia -  Abdominal   Peds  Hematology  (+) Blood dyscrasia, anemia ,   Anesthesia Other Findings   Reproductive/Obstetrics negative OB ROS                            Anesthesia Physical Anesthesia Plan  ASA: III  Anesthesia Plan: Spinal and MAC   Post-op Pain Management:  Regional for Post-op pain   Induction: Intravenous  PONV Risk Score and Plan: 4 or greater and Ondansetron, Dexamethasone, Propofol infusion and Midazolam  Airway Management Planned: Simple Face Mask  Additional Equipment:   Intra-op Plan:   Post-operative Plan:   Informed Consent: I have reviewed the patients History and Physical, chart, labs and discussed the procedure including the risks, benefits and alternatives for the proposed anesthesia with the patient or authorized representative who has indicated his/her understanding and acceptance.     Dental advisory given  Plan Discussed with: CRNA and Surgeon  Anesthesia Plan Comments:        Anesthesia  Quick Evaluation

## 2020-04-18 ENCOUNTER — Encounter (HOSPITAL_COMMUNITY): Payer: Self-pay | Admitting: Orthopaedic Surgery

## 2020-04-18 ENCOUNTER — Ambulatory Visit (HOSPITAL_COMMUNITY): Payer: Medicare PPO | Admitting: Anesthesiology

## 2020-04-18 ENCOUNTER — Observation Stay (HOSPITAL_COMMUNITY): Payer: Medicare PPO

## 2020-04-18 ENCOUNTER — Encounter (HOSPITAL_COMMUNITY)
Admission: RE | Disposition: A | Discharge status: Skilled Nursing Facility | Payer: Self-pay | Source: Home / Self Care | Attending: Orthopaedic Surgery

## 2020-04-18 ENCOUNTER — Observation Stay (HOSPITAL_COMMUNITY)
Admission: RE | Admit: 2020-04-18 | Discharge: 2020-04-21 | Disposition: A | Discharge status: Skilled Nursing Facility | Payer: Medicare PPO | Attending: Orthopaedic Surgery | Admitting: Orthopaedic Surgery

## 2020-04-18 DIAGNOSIS — F319 Bipolar disorder, unspecified: Secondary | ICD-10-CM | POA: Insufficient documentation

## 2020-04-18 DIAGNOSIS — I2510 Atherosclerotic heart disease of native coronary artery without angina pectoris: Secondary | ICD-10-CM | POA: Insufficient documentation

## 2020-04-18 DIAGNOSIS — I10 Essential (primary) hypertension: Secondary | ICD-10-CM | POA: Insufficient documentation

## 2020-04-18 DIAGNOSIS — Z86010 Personal history of colonic polyps: Secondary | ICD-10-CM | POA: Insufficient documentation

## 2020-04-18 DIAGNOSIS — M542 Cervicalgia: Secondary | ICD-10-CM | POA: Diagnosis not present

## 2020-04-18 DIAGNOSIS — M1990 Unspecified osteoarthritis, unspecified site: Secondary | ICD-10-CM | POA: Insufficient documentation

## 2020-04-18 DIAGNOSIS — M5430 Sciatica, unspecified side: Secondary | ICD-10-CM | POA: Diagnosis not present

## 2020-04-18 DIAGNOSIS — G8918 Other acute postprocedural pain: Secondary | ICD-10-CM | POA: Diagnosis not present

## 2020-04-18 DIAGNOSIS — Z96652 Presence of left artificial knee joint: Secondary | ICD-10-CM | POA: Diagnosis not present

## 2020-04-18 DIAGNOSIS — M1712 Unilateral primary osteoarthritis, left knee: Principal | ICD-10-CM

## 2020-04-18 DIAGNOSIS — M797 Fibromyalgia: Secondary | ICD-10-CM | POA: Diagnosis not present

## 2020-04-18 DIAGNOSIS — Z9842 Cataract extraction status, left eye: Secondary | ICD-10-CM | POA: Insufficient documentation

## 2020-04-18 DIAGNOSIS — M549 Dorsalgia, unspecified: Secondary | ICD-10-CM | POA: Diagnosis not present

## 2020-04-18 DIAGNOSIS — Z791 Long term (current) use of non-steroidal anti-inflammatories (NSAID): Secondary | ICD-10-CM | POA: Insufficient documentation

## 2020-04-18 DIAGNOSIS — I6782 Cerebral ischemia: Secondary | ICD-10-CM | POA: Diagnosis not present

## 2020-04-18 DIAGNOSIS — Z981 Arthrodesis status: Secondary | ICD-10-CM | POA: Insufficient documentation

## 2020-04-18 DIAGNOSIS — F419 Anxiety disorder, unspecified: Secondary | ICD-10-CM | POA: Insufficient documentation

## 2020-04-18 DIAGNOSIS — Z7982 Long term (current) use of aspirin: Secondary | ICD-10-CM | POA: Insufficient documentation

## 2020-04-18 DIAGNOSIS — Z471 Aftercare following joint replacement surgery: Secondary | ICD-10-CM | POA: Diagnosis not present

## 2020-04-18 DIAGNOSIS — E039 Hypothyroidism, unspecified: Secondary | ICD-10-CM | POA: Insufficient documentation

## 2020-04-18 DIAGNOSIS — G8929 Other chronic pain: Secondary | ICD-10-CM | POA: Insufficient documentation

## 2020-04-18 DIAGNOSIS — Z90710 Acquired absence of both cervix and uterus: Secondary | ICD-10-CM | POA: Diagnosis not present

## 2020-04-18 DIAGNOSIS — Z79899 Other long term (current) drug therapy: Secondary | ICD-10-CM | POA: Insufficient documentation

## 2020-04-18 DIAGNOSIS — K219 Gastro-esophageal reflux disease without esophagitis: Secondary | ICD-10-CM | POA: Insufficient documentation

## 2020-04-18 DIAGNOSIS — Z20822 Contact with and (suspected) exposure to covid-19: Secondary | ICD-10-CM | POA: Diagnosis not present

## 2020-04-18 DIAGNOSIS — Z90722 Acquired absence of ovaries, bilateral: Secondary | ICD-10-CM | POA: Diagnosis not present

## 2020-04-18 DIAGNOSIS — M8580 Other specified disorders of bone density and structure, unspecified site: Secondary | ICD-10-CM | POA: Insufficient documentation

## 2020-04-18 DIAGNOSIS — W182XXA Fall in (into) shower or empty bathtub, initial encounter: Secondary | ICD-10-CM | POA: Insufficient documentation

## 2020-04-18 DIAGNOSIS — R519 Headache, unspecified: Secondary | ICD-10-CM | POA: Diagnosis not present

## 2020-04-18 DIAGNOSIS — Z885 Allergy status to narcotic agent status: Secondary | ICD-10-CM | POA: Insufficient documentation

## 2020-04-18 DIAGNOSIS — Y93E1 Activity, personal bathing and showering: Secondary | ICD-10-CM | POA: Insufficient documentation

## 2020-04-18 DIAGNOSIS — Z961 Presence of intraocular lens: Secondary | ICD-10-CM | POA: Insufficient documentation

## 2020-04-18 DIAGNOSIS — E785 Hyperlipidemia, unspecified: Secondary | ICD-10-CM | POA: Diagnosis not present

## 2020-04-18 DIAGNOSIS — Z9841 Cataract extraction status, right eye: Secondary | ICD-10-CM | POA: Insufficient documentation

## 2020-04-18 DIAGNOSIS — Z96641 Presence of right artificial hip joint: Secondary | ICD-10-CM | POA: Insufficient documentation

## 2020-04-18 DIAGNOSIS — Z8249 Family history of ischemic heart disease and other diseases of the circulatory system: Secondary | ICD-10-CM | POA: Insufficient documentation

## 2020-04-18 DIAGNOSIS — R5382 Chronic fatigue, unspecified: Secondary | ICD-10-CM | POA: Insufficient documentation

## 2020-04-18 DIAGNOSIS — D649 Anemia, unspecified: Secondary | ICD-10-CM | POA: Insufficient documentation

## 2020-04-18 DIAGNOSIS — Z87442 Personal history of urinary calculi: Secondary | ICD-10-CM | POA: Diagnosis not present

## 2020-04-18 DIAGNOSIS — Z96612 Presence of left artificial shoulder joint: Secondary | ICD-10-CM | POA: Insufficient documentation

## 2020-04-18 DIAGNOSIS — Z96643 Presence of artificial hip joint, bilateral: Secondary | ICD-10-CM

## 2020-04-18 DIAGNOSIS — Z809 Family history of malignant neoplasm, unspecified: Secondary | ICD-10-CM | POA: Insufficient documentation

## 2020-04-18 HISTORY — PX: TOTAL KNEE ARTHROPLASTY: SHX125

## 2020-04-18 SURGERY — ARTHROPLASTY, KNEE, TOTAL
Anesthesia: Monitor Anesthesia Care | Site: Knee | Laterality: Left

## 2020-04-18 MED ORDER — MENTHOL 3 MG MT LOZG: 1.0000 | LOZENGE | OROMUCOSAL | Status: DC | PRN

## 2020-04-18 MED ORDER — DIPHENHYDRAMINE HCL 12.5 MG/5ML PO ELIX: 25.0000 mg | ORAL_SOLUTION | ORAL | Status: DC | PRN

## 2020-04-18 MED ORDER — IRRISEPT - 450ML BOTTLE WITH 0.05% CHG IN STERILE WATER, USP 99.95% OPTIME
TOPICAL | Status: DC | PRN
  Administered 2020-04-18: 450 mL

## 2020-04-18 MED ORDER — EPINEPHRINE PF 1 MG/ML IJ SOLN
INTRAMUSCULAR | Status: AC
  Filled 2020-04-18: qty 1

## 2020-04-18 MED ORDER — ALPRAZOLAM 0.25 MG PO TABS: 0.2500 mg | ORAL_TABLET | Freq: Every day | ORAL | Status: DC | PRN

## 2020-04-18 MED ORDER — LACTATED RINGERS IV SOLN: INTRAVENOUS | Status: DC | PRN

## 2020-04-18 MED ORDER — FENTANYL CITRATE (PF) 100 MCG/2ML IJ SOLN
INTRAMUSCULAR | Status: DC | PRN
  Administered 2020-04-18: 25 ug via INTRAVENOUS

## 2020-04-18 MED ORDER — FENTANYL CITRATE (PF) 250 MCG/5ML IJ SOLN
INTRAMUSCULAR | Status: AC
  Filled 2020-04-18: qty 5

## 2020-04-18 MED ORDER — PHENYLEPHRINE 40 MCG/ML (10ML) SYRINGE FOR IV PUSH (FOR BLOOD PRESSURE SUPPORT)
PREFILLED_SYRINGE | INTRAVENOUS | Status: AC
  Filled 2020-04-18: qty 10

## 2020-04-18 MED ORDER — CEFAZOLIN SODIUM-DEXTROSE 2-4 GM/100ML-% IV SOLN
2.0000 g | INTRAVENOUS | Status: AC
  Administered 2020-04-18: 2 g via INTRAVENOUS
  Filled 2020-04-18: qty 100

## 2020-04-18 MED ORDER — LEVOTHYROXINE SODIUM 25 MCG PO TABS
25.0000 ug | ORAL_TABLET | Freq: Every day | ORAL | Status: DC
  Administered 2020-04-19 – 2020-04-21 (×3): 25 ug via ORAL
  Filled 2020-04-18 (×3): qty 1

## 2020-04-18 MED ORDER — PHENYLEPHRINE HCL-NACL 10-0.9 MG/250ML-% IV SOLN
INTRAVENOUS | Status: DC | PRN
  Administered 2020-04-18: 20 ug/min via INTRAVENOUS

## 2020-04-18 MED ORDER — HYDROMORPHONE HCL 1 MG/ML IJ SOLN
0.2500 mg | INTRAMUSCULAR | Status: DC | PRN
  Administered 2020-04-18 (×3): 0.5 mg via INTRAVENOUS

## 2020-04-18 MED ORDER — LOSARTAN POTASSIUM 50 MG PO TABS
50.0000 mg | ORAL_TABLET | Freq: Every day | ORAL | Status: DC
  Administered 2020-04-19 – 2020-04-21 (×3): 50 mg via ORAL
  Filled 2020-04-18 (×3): qty 1

## 2020-04-18 MED ORDER — BUPROPION HCL ER (XL) 300 MG PO TB24
300.0000 mg | ORAL_TABLET | Freq: Every day | ORAL | Status: DC
  Administered 2020-04-19 – 2020-04-21 (×3): 300 mg via ORAL
  Filled 2020-04-18 (×3): qty 1

## 2020-04-18 MED ORDER — 0.9 % SODIUM CHLORIDE (POUR BTL) OPTIME
TOPICAL | Status: DC | PRN
  Administered 2020-04-18: 1000 mL

## 2020-04-18 MED ORDER — HYDROMORPHONE HCL 1 MG/ML IJ SOLN
INTRAMUSCULAR | Status: AC
  Filled 2020-04-18: qty 1

## 2020-04-18 MED ORDER — TRANEXAMIC ACID-NACL 1000-0.7 MG/100ML-% IV SOLN
1000.0000 mg | INTRAVENOUS | Status: AC
  Administered 2020-04-18: 1000 mg via INTRAVENOUS
  Filled 2020-04-18: qty 100

## 2020-04-18 MED ORDER — MAGNESIUM CITRATE PO SOLN: 1.0000 | Freq: Once | ORAL | Status: DC | PRN

## 2020-04-18 MED ORDER — SODIUM CHLORIDE 0.9 % IR SOLN
Status: DC | PRN
  Administered 2020-04-18: 3000 mL

## 2020-04-18 MED ORDER — PROPOFOL 10 MG/ML IV BOLUS
INTRAVENOUS | Status: DC | PRN
  Administered 2020-04-18: 20 mg via INTRAVENOUS
  Administered 2020-04-18: 30 mg via INTRAVENOUS

## 2020-04-18 MED ORDER — CHLORHEXIDINE GLUCONATE 0.12 % MT SOLN
15.0000 mL | Freq: Once | OROMUCOSAL | Status: AC
  Administered 2020-04-18: 15 mL via OROMUCOSAL
  Filled 2020-04-18: qty 15

## 2020-04-18 MED ORDER — LIDOCAINE 2% (20 MG/ML) 5 ML SYRINGE
INTRAMUSCULAR | Status: AC
  Filled 2020-04-18: qty 5

## 2020-04-18 MED ORDER — OXYCODONE HCL 5 MG PO TABS
5.0000 mg | ORAL_TABLET | ORAL | Status: DC | PRN
  Administered 2020-04-18 – 2020-04-19 (×2): 10 mg via ORAL
  Administered 2020-04-19 (×2): 5 mg via ORAL
  Filled 2020-04-18: qty 1
  Filled 2020-04-18 (×3): qty 2
  Filled 2020-04-18: qty 1
  Filled 2020-04-18: qty 2

## 2020-04-18 MED ORDER — PHENYLEPHRINE HCL (PRESSORS) 10 MG/ML IV SOLN
INTRAVENOUS | Status: DC | PRN
  Administered 2020-04-18 (×3): 80 ug via INTRAVENOUS

## 2020-04-18 MED ORDER — SODIUM CHLORIDE 0.9 % IV SOLN: INTRAVENOUS | Status: DC

## 2020-04-18 MED ORDER — CEFAZOLIN SODIUM-DEXTROSE 2-4 GM/100ML-% IV SOLN
2.0000 g | Freq: Four times a day (QID) | INTRAVENOUS | Status: AC
  Administered 2020-04-18 – 2020-04-19 (×3): 2 g via INTRAVENOUS
  Filled 2020-04-18 (×3): qty 100

## 2020-04-18 MED ORDER — ONDANSETRON HCL 4 MG/2ML IJ SOLN
INTRAMUSCULAR | Status: DC | PRN
  Administered 2020-04-18: 4 mg via INTRAVENOUS

## 2020-04-18 MED ORDER — BUPIVACAINE IN DEXTROSE 0.75-8.25 % IT SOLN
INTRATHECAL | Status: DC | PRN
Start: 2020-04-18 — End: 2020-04-18
  Administered 2020-04-18: 1.4 mL via INTRATHECAL

## 2020-04-18 MED ORDER — POVIDONE-IODINE 10 % EX SWAB: 2.0000 "application " | Freq: Once | CUTANEOUS | Status: DC

## 2020-04-18 MED ORDER — OXYCODONE HCL ER 10 MG PO T12A
10.0000 mg | EXTENDED_RELEASE_TABLET | Freq: Two times a day (BID) | ORAL | Status: DC
  Administered 2020-04-18 – 2020-04-21 (×7): 10 mg via ORAL
  Filled 2020-04-18 (×7): qty 1

## 2020-04-18 MED ORDER — LAMOTRIGINE 150 MG PO TABS
150.0000 mg | ORAL_TABLET | Freq: Every day | ORAL | Status: DC
  Administered 2020-04-18 – 2020-04-20 (×3): 150 mg via ORAL
  Filled 2020-04-18 (×4): qty 1

## 2020-04-18 MED ORDER — ACETAMINOPHEN 500 MG PO TABS
1000.0000 mg | ORAL_TABLET | Freq: Four times a day (QID) | ORAL | Status: AC
  Administered 2020-04-18 – 2020-04-19 (×3): 1000 mg via ORAL
  Filled 2020-04-18 (×4): qty 2

## 2020-04-18 MED ORDER — ATORVASTATIN CALCIUM 40 MG PO TABS
40.0000 mg | ORAL_TABLET | Freq: Every day | ORAL | Status: DC
  Administered 2020-04-18 – 2020-04-20 (×3): 40 mg via ORAL
  Filled 2020-04-18 (×3): qty 1

## 2020-04-18 MED ORDER — KETOROLAC TROMETHAMINE 15 MG/ML IJ SOLN
30.0000 mg | Freq: Four times a day (QID) | INTRAMUSCULAR | Status: DC
  Administered 2020-04-18: 30 mg via INTRAVENOUS

## 2020-04-18 MED ORDER — METOCLOPRAMIDE HCL 5 MG/ML IJ SOLN: 5.0000 mg | Freq: Three times a day (TID) | INTRAMUSCULAR | Status: DC | PRN

## 2020-04-18 MED ORDER — DEXAMETHASONE SODIUM PHOSPHATE 10 MG/ML IJ SOLN
INTRAMUSCULAR | Status: AC
  Filled 2020-04-18: qty 1

## 2020-04-18 MED ORDER — HYDROMORPHONE HCL 1 MG/ML IJ SOLN
0.5000 mg | INTRAMUSCULAR | Status: DC | PRN
  Administered 2020-04-18: 1 mg via INTRAVENOUS
  Filled 2020-04-18: qty 1

## 2020-04-18 MED ORDER — PHENOL 1.4 % MT LIQD: 1.0000 | OROMUCOSAL | Status: DC | PRN

## 2020-04-18 MED ORDER — BUPIVACAINE HCL (PF) 0.25 % IJ SOLN
INTRAMUSCULAR | Status: AC
  Filled 2020-04-18: qty 20

## 2020-04-18 MED ORDER — POLYETHYLENE GLYCOL 3350 17 G PO PACK
17.0000 g | PACK | Freq: Every day | ORAL | Status: DC | PRN
  Administered 2020-04-20 – 2020-04-21 (×2): 17 g via ORAL
  Filled 2020-04-18 (×2): qty 1

## 2020-04-18 MED ORDER — BUPIVACAINE HCL (PF) 0.25 % IJ SOLN
INTRAMUSCULAR | Status: AC
  Filled 2020-04-18: qty 30

## 2020-04-18 MED ORDER — PROPOFOL 10 MG/ML IV BOLUS
INTRAVENOUS | Status: AC
  Filled 2020-04-18: qty 20

## 2020-04-18 MED ORDER — DULOXETINE HCL 60 MG PO CPEP
120.0000 mg | ORAL_CAPSULE | Freq: Every day | ORAL | Status: DC
  Administered 2020-04-19 – 2020-04-21 (×3): 120 mg via ORAL
  Filled 2020-04-18 (×3): qty 2

## 2020-04-18 MED ORDER — ACETAMINOPHEN 325 MG PO TABS
325.0000 mg | ORAL_TABLET | Freq: Four times a day (QID) | ORAL | Status: DC | PRN
  Administered 2020-04-19: 650 mg via ORAL
  Administered 2020-04-20: 325 mg via ORAL
  Administered 2020-04-21: 650 mg via ORAL
  Filled 2020-04-18: qty 1
  Filled 2020-04-18 (×2): qty 2

## 2020-04-18 MED ORDER — SODIUM CHLORIDE 0.9% FLUSH
INTRAVENOUS | Status: DC | PRN
  Administered 2020-04-18: 20 mL

## 2020-04-18 MED ORDER — METOCLOPRAMIDE HCL 5 MG PO TABS: 5.0000 mg | ORAL_TABLET | Freq: Three times a day (TID) | ORAL | Status: DC | PRN

## 2020-04-18 MED ORDER — ONDANSETRON HCL 4 MG/2ML IJ SOLN: 4.0000 mg | Freq: Four times a day (QID) | INTRAMUSCULAR | Status: DC | PRN

## 2020-04-18 MED ORDER — KETOROLAC TROMETHAMINE 30 MG/ML IJ SOLN
INTRAMUSCULAR | Status: AC
  Filled 2020-04-18: qty 1

## 2020-04-18 MED ORDER — BUPIVACAINE HCL (PF) 0.25 % IJ SOLN
INTRAMUSCULAR | Status: DC | PRN
  Administered 2020-04-18: 20 mL

## 2020-04-18 MED ORDER — VANCOMYCIN HCL 1000 MG IV SOLR
INTRAVENOUS | Status: DC | PRN
  Administered 2020-04-18: 1000 mg

## 2020-04-18 MED ORDER — ORAL CARE MOUTH RINSE: 15.0000 mL | Freq: Once | OROMUCOSAL | Status: AC

## 2020-04-18 MED ORDER — ONDANSETRON HCL 4 MG/2ML IJ SOLN
INTRAMUSCULAR | Status: AC
  Filled 2020-04-18: qty 2

## 2020-04-18 MED ORDER — PROPOFOL 500 MG/50ML IV EMUL
INTRAVENOUS | Status: DC | PRN
  Administered 2020-04-18: 75 ug/kg/min via INTRAVENOUS

## 2020-04-18 MED ORDER — DOCUSATE SODIUM 100 MG PO CAPS
100.0000 mg | ORAL_CAPSULE | Freq: Two times a day (BID) | ORAL | Status: DC
  Administered 2020-04-18 – 2020-04-21 (×7): 100 mg via ORAL
  Filled 2020-04-18 (×7): qty 1

## 2020-04-18 MED ORDER — MIDAZOLAM HCL 2 MG/2ML IJ SOLN
INTRAMUSCULAR | Status: AC
  Filled 2020-04-18: qty 2

## 2020-04-18 MED ORDER — RISPERIDONE 0.5 MG PO TABS
0.5000 mg | ORAL_TABLET | Freq: Every day | ORAL | Status: DC
  Administered 2020-04-18 – 2020-04-20 (×3): 0.5 mg via ORAL
  Filled 2020-04-18 (×4): qty 1

## 2020-04-18 MED ORDER — MIDAZOLAM HCL 2 MG/2ML IJ SOLN
INTRAMUSCULAR | Status: DC | PRN
  Administered 2020-04-18: 1 mg via INTRAVENOUS

## 2020-04-18 MED ORDER — CELECOXIB 200 MG PO CAPS
200.0000 mg | ORAL_CAPSULE | Freq: Two times a day (BID) | ORAL | Status: DC
  Administered 2020-04-18 – 2020-04-21 (×7): 200 mg via ORAL
  Filled 2020-04-18 (×8): qty 1

## 2020-04-18 MED ORDER — ACETAMINOPHEN 500 MG PO TABS
1000.0000 mg | ORAL_TABLET | Freq: Once | ORAL | Status: AC
  Administered 2020-04-18: 1000 mg via ORAL
  Filled 2020-04-18: qty 2

## 2020-04-18 MED ORDER — LIDOCAINE HCL (CARDIAC) PF 100 MG/5ML IV SOSY
PREFILLED_SYRINGE | INTRAVENOUS | Status: DC | PRN
  Administered 2020-04-18: 60 mg via INTRAVENOUS

## 2020-04-18 MED ORDER — ASPIRIN 81 MG PO CHEW
81.0000 mg | CHEWABLE_TABLET | Freq: Two times a day (BID) | ORAL | Status: DC
  Administered 2020-04-18 – 2020-04-21 (×6): 81 mg via ORAL
  Filled 2020-04-18 (×6): qty 1

## 2020-04-18 MED ORDER — DEXAMETHASONE SODIUM PHOSPHATE 10 MG/ML IJ SOLN
10.0000 mg | Freq: Once | INTRAMUSCULAR | Status: AC
  Administered 2020-04-19: 10 mg via INTRAVENOUS
  Filled 2020-04-18: qty 1

## 2020-04-18 MED ORDER — ALUM & MAG HYDROXIDE-SIMETH 200-200-20 MG/5ML PO SUSP: 30.0000 mL | ORAL | Status: DC | PRN

## 2020-04-18 MED ORDER — ONDANSETRON HCL 4 MG PO TABS: 4.0000 mg | ORAL_TABLET | Freq: Four times a day (QID) | ORAL | Status: DC | PRN

## 2020-04-18 MED ORDER — VANCOMYCIN HCL 1000 MG IV SOLR
INTRAVENOUS | Status: AC
  Filled 2020-04-18: qty 1000

## 2020-04-18 MED ORDER — GABAPENTIN 300 MG PO CAPS
300.0000 mg | ORAL_CAPSULE | Freq: Three times a day (TID) | ORAL | Status: DC
  Administered 2020-04-18 – 2020-04-21 (×9): 300 mg via ORAL
  Filled 2020-04-18 (×9): qty 1

## 2020-04-18 MED ORDER — KETOROLAC TROMETHAMINE 15 MG/ML IJ SOLN
INTRAMUSCULAR | Status: AC
  Filled 2020-04-18: qty 2

## 2020-04-18 MED ORDER — DEXAMETHASONE SODIUM PHOSPHATE 4 MG/ML IJ SOLN
INTRAMUSCULAR | Status: DC | PRN
  Administered 2020-04-18: 5 mg via INTRAVENOUS

## 2020-04-18 MED ORDER — OXYCODONE HCL 5 MG PO TABS
10.0000 mg | ORAL_TABLET | ORAL | Status: DC | PRN
  Administered 2020-04-19 – 2020-04-20 (×3): 10 mg via ORAL
  Filled 2020-04-18: qty 3

## 2020-04-18 MED ORDER — ESTRADIOL 1 MG PO TABS
0.5000 mg | ORAL_TABLET | Freq: Every day | ORAL | Status: DC
  Administered 2020-04-18 – 2020-04-21 (×4): 0.5 mg via ORAL
  Filled 2020-04-18 (×4): qty 0.5

## 2020-04-18 MED ORDER — SORBITOL 70 % SOLN: 30.0000 mL | Freq: Every day | Status: DC | PRN

## 2020-04-18 SURGICAL SUPPLY — 87 items
ADH SKN CLS APL DERMABOND .7 (GAUZE/BANDAGES/DRESSINGS) ×1
ADH SKN CLS LQ APL DERMABOND (GAUZE/BANDAGES/DRESSINGS) ×1
ALCOHOL 70% 16 OZ (MISCELLANEOUS) ×3 IMPLANT
BAG DECANTER FOR FLEXI CONT (MISCELLANEOUS) ×3 IMPLANT
BANDAGE ESMARK 6X9 LF (GAUZE/BANDAGES/DRESSINGS) IMPLANT
BASEPLATE TIBIAL LT SZ3 (Knees) ×2 IMPLANT
BLADE SAG 18X100X1.27 (BLADE) ×3 IMPLANT
BNDG CMPR 9X6 STRL LF SNTH (GAUZE/BANDAGES/DRESSINGS)
BNDG ESMARK 6X9 LF (GAUZE/BANDAGES/DRESSINGS)
BOWL SMART MIX CTS (DISPOSABLE) ×3 IMPLANT
BSPLAT TIB 3 CMNT M TPR KN LT (Knees) ×1 IMPLANT
CEMENT BONE REFOBACIN R1X40 US (Cement) ×4 IMPLANT
CLOSURE STERI-STRIP 1/2X4 (GAUZE/BANDAGES/DRESSINGS) ×2
CLOSURE WOUND 1/2 X4 (GAUZE/BANDAGES/DRESSINGS) ×1
CLSR STERI-STRIP ANTIMIC 1/2X4 (GAUZE/BANDAGES/DRESSINGS) ×4 IMPLANT
COMP FEMORAL POST SIZE 4N LEFT (Joint) ×3 IMPLANT
COMPONENT FEMRL POST SZ4N LT (Joint) IMPLANT
COVER SURGICAL LIGHT HANDLE (MISCELLANEOUS) ×3 IMPLANT
COVER WAND RF STERILE (DRAPES) IMPLANT
CUFF TOURN SGL QUICK 34 (TOURNIQUET CUFF) ×3
CUFF TOURN SGL QUICK 42 (TOURNIQUET CUFF) IMPLANT
CUFF TRNQT CYL 34X4.125X (TOURNIQUET CUFF) ×1 IMPLANT
DERMABOND ADHESIVE PROPEN (GAUZE/BANDAGES/DRESSINGS) ×2
DERMABOND ADVANCED (GAUZE/BANDAGES/DRESSINGS) ×2
DERMABOND ADVANCED .7 DNX12 (GAUZE/BANDAGES/DRESSINGS) ×1 IMPLANT
DERMABOND ADVANCED .7 DNX6 (GAUZE/BANDAGES/DRESSINGS) IMPLANT
DRAPE EXTREMITY T 121X128X90 (DISPOSABLE) ×3 IMPLANT
DRAPE HALF SHEET 40X57 (DRAPES) ×3 IMPLANT
DRAPE INCISE IOBAN 66X45 STRL (DRAPES) IMPLANT
DRAPE ORTHO SPLIT 77X108 STRL (DRAPES) ×6
DRAPE POUCH INSTRU U-SHP 10X18 (DRAPES) ×3 IMPLANT
DRAPE SURG ORHT 6 SPLT 77X108 (DRAPES) ×2 IMPLANT
DRAPE U-SHAPE 47X51 STRL (DRAPES) ×6 IMPLANT
DRESSING AQUACEL AG SP 3.5X10 (GAUZE/BANDAGES/DRESSINGS) IMPLANT
DRSG AQUACEL AG ADV 3.5X10 (GAUZE/BANDAGES/DRESSINGS) ×3 IMPLANT
DRSG AQUACEL AG SP 3.5X10 (GAUZE/BANDAGES/DRESSINGS) ×3
DURAPREP 26ML APPLICATOR (WOUND CARE) ×9 IMPLANT
ELECT CAUTERY BLADE 6.4 (BLADE) ×1 IMPLANT
ELECT REM PT RETURN 9FT ADLT (ELECTROSURGICAL) ×3
ELECTRODE REM PT RTRN 9FT ADLT (ELECTROSURGICAL) ×1 IMPLANT
GLOVE BIOGEL PI IND STRL 7.0 (GLOVE) ×1 IMPLANT
GLOVE BIOGEL PI INDICATOR 7.0 (GLOVE) ×2
GLOVE ECLIPSE 7.0 STRL STRAW (GLOVE) ×9 IMPLANT
GLOVE SKINSENSE NS SZ7.5 (GLOVE) ×6
GLOVE SKINSENSE STRL SZ7.5 (GLOVE) ×3 IMPLANT
GLOVE SURG SYN 7.5  E (GLOVE) ×12
GLOVE SURG SYN 7.5 E (GLOVE) ×4 IMPLANT
GLOVE SURG SYN 7.5 PF PI (GLOVE) ×4 IMPLANT
GOWN STRL REIN XL XLG (GOWN DISPOSABLE) ×3 IMPLANT
GOWN STRL REUS W/ TWL LRG LVL3 (GOWN DISPOSABLE) ×1 IMPLANT
GOWN STRL REUS W/TWL LRG LVL3 (GOWN DISPOSABLE) ×3
HANDPIECE INTERPULSE COAX TIP (DISPOSABLE) ×3
HOOD PEEL AWAY FLYTE STAYCOOL (MISCELLANEOUS) ×6 IMPLANT
INSERT SPEED PIN RIMMED 45MM (PIN) ×6 IMPLANT
INSERT XLPE 11MM SZ 3-4 (Knees) ×2 IMPLANT
JET LAVAGE IRRISEPT WOUND (IRRIGATION / IRRIGATOR) ×3
KIT BASIN OR (CUSTOM PROCEDURE TRAY) ×3 IMPLANT
KIT TURNOVER KIT B (KITS) ×3 IMPLANT
LAVAGE JET IRRISEPT WOUND (IRRIGATION / IRRIGATOR) ×1 IMPLANT
MANIFOLD NEPTUNE II (INSTRUMENTS) ×3 IMPLANT
MARKER SKIN DUAL TIP RULER LAB (MISCELLANEOUS) ×3 IMPLANT
NDL SPNL 18GX3.5 QUINCKE PK (NEEDLE) ×2 IMPLANT
NEEDLE SPNL 18GX3.5 QUINCKE PK (NEEDLE) ×6 IMPLANT
NS IRRIG 1000ML POUR BTL (IV SOLUTION) ×3 IMPLANT
PACK TOTAL JOINT (CUSTOM PROCEDURE TRAY) ×3 IMPLANT
PAD ARMBOARD 7.5X6 YLW CONV (MISCELLANEOUS) ×6 IMPLANT
PATELLA RESURFACING 29MM (Knees) ×2 IMPLANT
PIN TROCAR 3 INCH (PIN) ×16 IMPLANT
SAW OSC TIP CART 19.5X105X1.3 (SAW) ×3 IMPLANT
SET HNDPC FAN SPRY TIP SCT (DISPOSABLE) ×1 IMPLANT
STAPLER VISISTAT 35W (STAPLE) IMPLANT
STRIP CLOSURE SKIN 1/2X4 (GAUZE/BANDAGES/DRESSINGS) ×1 IMPLANT
SUCTION FRAZIER HANDLE 10FR (MISCELLANEOUS) ×3
SUCTION TUBE FRAZIER 10FR DISP (MISCELLANEOUS) ×1 IMPLANT
SUT ETHILON 2 0 FS 18 (SUTURE) ×4 IMPLANT
SUT MNCRL AB 4-0 PS2 18 (SUTURE) IMPLANT
SUT VIC AB 0 CT1 27 (SUTURE) ×6
SUT VIC AB 0 CT1 27XBRD ANBCTR (SUTURE) ×2 IMPLANT
SUT VIC AB 1 CTX 27 (SUTURE) ×7 IMPLANT
SUT VIC AB 2-0 CT1 27 (SUTURE) ×3
SUT VIC AB 2-0 CT1 TAPERPNT 27 (SUTURE) ×4 IMPLANT
SYR 50ML LL SCALE MARK (SYRINGE) ×6 IMPLANT
TOWEL GREEN STERILE (TOWEL DISPOSABLE) ×3 IMPLANT
TOWEL GREEN STERILE FF (TOWEL DISPOSABLE) ×3 IMPLANT
TRAY CATH 16FR W/PLASTIC CATH (SET/KITS/TRAYS/PACK) IMPLANT
UNDERPAD 30X36 HEAVY ABSORB (UNDERPADS AND DIAPERS) ×3 IMPLANT
WRAP KNEE MAXI GEL POST OP (GAUZE/BANDAGES/DRESSINGS) ×3 IMPLANT

## 2020-04-18 NOTE — Progress Notes (Signed)
Orthopedic Tech Progress Note Patient Details:  [name] [name] 1940-12-17 [id number]  CPM Left Knee CPM Left Knee: On Left Knee Flexion (Degrees): 0 Left Knee Extension (Degrees): 90 Additional Comments: added ice  Post Interventions Patient Tolerated: Well Instructions Provided: Care of device  Janit Pagan 04/18/2020, 11:00 AM

## 2020-04-18 NOTE — H&P (Signed)
PREOPERATIVE H&P  Chief Complaint: left knee degenerative joint disease  HPI: [name] [name] is a 79 y.o. female who presents for surgical treatment of left knee degenerative joint disease.  She denies any changes in medical history.  Past Medical History:  Diagnosis Date  . Anemia   . Anxiety   . Arthritis   . Bipolar affective disorder ([place])   . Cataract   . Chronic back pain   . Chronic fatigue   . Complication of anesthesia   . Constipation   . Depression   . Dizziness   . Dyslipidemia   . Fibromyalgia   . GERD (gastroesophageal reflux disease)   . H/O: hysterectomy   . History of kidney stones   . Hx of adenomatous colonic polyps 2005  . Hyperlipidemia   . Hypertension   . Hypothyroidism   . Knee pain   . Non-obstructive CAD    a. 08/2015 Cath: LM 40, LCX small, 30ost, 65m, OM2 small, RCA nl/small, EF 55-65%.  . Osteopenia   . PONV (postoperative nausea and vomiting)   . Sciatica    Past Surgical History:  Procedure Laterality Date  . ABDOMINAL HYSTERECTOMY  1994   TAH- DUB, BSO  . [place] bunionectomy     bilateral  . BREAST BIOPSY Left 1989  . BREAST LUMPECTOMY    . BUNIONECTOMY Left 2005  . CARDIAC CATHETERIZATION  2010   +CAD  . CARDIAC CATHETERIZATION N/A 08/19/2015   Procedure: Left Heart Cath and Coronary Angiography;  Surgeon: Belva Crome, MD;  Location: [facility];  Service: Cardiovascular;  Laterality: N/A;  . 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  Remainder of colonic mucosa was normal  . COLONOSCOPY WITH ESOPHAGOGASTRODUODENOSCOPY (EGD) N/A 02/01/2014   PNT:IRWE erosive reflux/gastric polyp/normal rectum/scattered left sided diverticula, normal distal TI. gastric bx negative, fundic gland polyp. next TCS 01/2019.  Marland Kitchen CYSTECTOMY     pilonidial cyst  . ESOPHAGOGASTRODUODENOSCOPY     followed by colonoscopy with snare polypectomy  . EYE SURGERY  Bilateral    cataract surgery with lens implants  . REVISION TOTAL HIP ARTHROPLASTY Right   . right knee replacement  02/2013  . SHOULDER ARTHROSCOPY Left 2005  . SHOULDER ARTHROSCOPY Right   . SPINAL FUSION    . SPINAL FUSION  2004   L4-L5  . TOTAL HIP ARTHROPLASTY Right 06/2006  . TOTAL HIP ARTHROPLASTY  8/12  . TOTAL SHOULDER REPLACEMENT Left 08/2005   Social History   Socioeconomic History  . Marital status: Widowed    Spouse name: Not on file  . Number of children: Not on file  . Years of education: Not on file  . Highest education level: Not on file  Occupational History  . 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 Smoking status: Never Smoker  . Smokeless tobacco: Never Used  Substance and Sexual Activity  . Alcohol use: Yes    Alcohol/week: 7.0 standard drinks    Types: 7 Glasses of wine per week    Comment: almost daily  . Drug use: No  . Sexual activity: Not Currently    Partners: Male    Birth control/protection: Surgical    Comment: TAH  Other Topics Concern  . Not on file  Social History Narrative  . Not on file   Social Determinants of Health   Financial Resource Strain:   .  Difficulty of Paying Living Expenses:   Food Insecurity:   . Worried About Charity fundraiser in the Last Year:   . Arboriculturist in the Last Year:   Transportation Needs:   . Film/video editor (Medical):   Marland Kitchen Lack of Transportation (Non-Medical):   Physical Activity:   . Days of Exercise per Week:   . Minutes of Exercise per Session:   Stress:   . Feeling of Stress :   Social Connections:   . Frequency of Communication with Friends and Family:   . Frequency of Social Gatherings with Friends and Family:   . Attends Religious Services:   . Active Member of Clubs or Organizations:   . Attends Archivist Meetings:   Marland Kitchen Marital Status:    Family History  Problem Relation Age of Onset  . Heart disease  Mother   . Heart attack Mother   . Heart disease Father   . Heart attack Father   . Stroke Sister   . Stroke Maternal Grandfather   . Cancer Paternal Grandmother        unknown primary  . Colon cancer Neg Hx    Allergies  Allergen Reactions  . Hydrocodone-Acetaminophen Hives and Itching   Prior to Admission medications   Medication Sig Start Date End Date Taking? Authorizing Provider  aspirin 81 MG tablet Take 81 mg by mouth at bedtime.    Yes [provider]  atorvastatin (LIPITOR) 40 MG tablet TAKE (1) TABLET BY MOUTH AT BEDTIME. Patient taking differently: Take 40 mg by mouth daily. TAKE (1) TABLET BY MOUTH AT BEDTIME. 04/08/20  Yes Bhagat, Bhavinkumar, PA  bisacodyl (WOMENS LAXATIVE) 5 MG EC tablet Take 1 tablet by mouth daily as needed for mild constipation (constipation).    Yes [provider]  Calcium Carb-Cholecalciferol (CALCIUM/VITAMIN D PO) Take 1 tablet by mouth daily.   Yes [provider]  Cholecalciferol (VITAMIN D) 50 MCG (2000 UT) CAPS Take 2,000 Units by mouth daily.   Yes [provider]  Cimetidine (TAGAMET PO) Take 1 tablet by mouth daily.    Yes [provider]  DULoxetine (CYMBALTA) 60 MG capsule Take 120 mg by mouth daily.    Yes [provider]  estradiol (ESTRACE) 1 MG tablet TAKE 1/2 TABLET BY MOUTH DAILY. Patient taking differently: Take 0.5 mg by mouth daily.  11/14/19  Yes Regina Eck, CNM  lamoTRIgine (LAMICTAL) 150 MG tablet Take 150 mg by mouth at bedtime.   Yes [provider]  levothyroxine (SYNTHROID) 25 MCG tablet Take 1 tablet (25 mcg total) by mouth daily. Patient taking differently: Take 25 mcg by mouth daily. Take at [facility] 03/01/20  Yes Corum, Rex Kras, MD  losartan (COZAAR) 50 MG tablet Take 1 tablet (50 mg total) by mouth daily. 11/17/19  Yes Fay Records, MD  meloxicam (MOBIC) 7.5 MG tablet Take 7.5 mg by mouth 2 (two) times daily.    Yes [provider]  Multiple Vitamin  (MULTIVITAMIN) tablet Take 1 tablet by mouth daily.     Yes [provider]  niacin (SLO-NIACIN) 500 MG tablet Take 500 mg by mouth daily.    Yes [provider]  Omega-3 Fatty Acids (FISH OIL) 1000 MG CAPS Take 1,000 mg by mouth daily.    Yes [provider]  Probiotic Product (PROBIOTIC DAILY PO) Take 1 tablet by mouth daily.    Yes [provider]  risperiDONE (RISPERDAL) 1 MG tablet Take  0.5 mg by mouth at bedtime.    Yes [provider]  WELLBUTRIN XL 300 MG 24 hr tablet Take 300 mg by mouth daily.  07/08/14  Yes [provider]  ALPRAZolam Duanne Moron) 0.5 MG tablet Take 0.25-0.5 mg by mouth daily as needed for anxiety.  12/15/13   [provider]  nitroGLYCERIN (NITROSTAT) 0.4 MG SL tablet PLACE 1 TAB UNDER TONGUE EVERY 5 MIN IF NEEDED FOR CHEST PAIN. MAY USE 3 TIMES.NO RELIEF CALL 911. Patient taking differently: Place 0.4 mg under the tongue every 5 (five) minutes as needed for chest pain. PLACE 1 TAB UNDER TONGUE EVERY 5 MIN IF NEEDED FOR CHEST PAIN. MAY USE 3 TIMES.NO RELIEF CALL 911. 06/19/17   Lyda Jester M, PA-C     Positive ROS: All other systems have been reviewed and were otherwise negative with the exception of those mentioned in the HPI and as above.  Physical Exam: General: Alert, no acute distress Cardiovascular: No pedal edema Respiratory: No cyanosis, no use of accessory musculature GI: abdomen soft Skin: No lesions in the area of chief complaint Neurologic: Sensation intact distally Psychiatric: Patient is competent for consent with normal mood and affect Lymphatic: no lymphedema  MUSCULOSKELETAL: exam stable  Assessment: left knee degenerative joint disease  Plan: Plan for Procedure(s): LEFT TOTAL KNEE ARTHROPLASTY  The risks benefits and alternatives were discussed with the patient including but not limited to the risks of nonoperative treatment, versus surgical intervention including infection,  bleeding, nerve injury,  blood clots, cardiopulmonary complications, morbidity, mortality, among others, and they were willing to proceed.   Preoperative templating of the joint replacement has been completed, documented, and submitted to the Operating Room personnel in order to optimize intra-operative equipment management.   Eduard Roux, MD 04/18/2020 7:18 AM

## 2020-04-18 NOTE — Care Plan (Signed)
Ortho Bundle Case Management Note  Patient Details  Name: [name] [name] MRN: [id number] Date of Birth: 10/27/1941  [facility] call to patient to discuss her upcoming Left TKA with Dr. Erlinda Hong on Monday, 04/18/20. She is an Ortho bundle patient through [facility]. She is agreeable to case management. She noted that she lives alone and has no one that can help as her daughter lives in [place] area. She has arranged to go to [facility] following a hospital stay. CM will reach out to [facility] as well. CPM ordered through [facility] [place] to be delivered to [facility]. No other DME needs at this time. Will continue to follow for CM needs.                         DME Arranged:  Continuous passive motion machine DME Agency:  [employer]  HH Arranged:    [place] Agency:     Additional Comments: Please contact me with any questions of if this plan should need to change.  Jamse Arn, RN, BSN, [employer]  [phone] 04/18/2020, 11:23 AM

## 2020-04-18 NOTE — Anesthesia Procedure Notes (Signed)
Anesthesia Regional Block: Adductor canal block   Pre-Anesthetic Checklist: ,, timeout performed, Correct Patient, Correct Site, Correct Laterality, Correct Procedure, Correct Position, site marked, Risks and benefits discussed, pre-op evaluation,  At surgeon's request and post-op pain management  Laterality: Left  Prep: Maximum Sterile Barrier Precautions used, chloraprep       Needles:  Injection technique: Single-shot  Needle Type: Echogenic Stimulator Needle     Needle Length: 9cm  Needle Gauge: 21     Additional Needles:   Procedures:,,,, ultrasound used (permanent image in chart),,,,  Narrative:  Start time: 04/18/2020 7:01 AM End time: 04/18/2020 7:11 AM Injection made incrementally with aspirations every 5 mL.  Performed by: Personally  Anesthesiologist: Roderic Palau, MD  Additional Notes: 2% Lidocaine skin wheel.

## 2020-04-18 NOTE — Addendum Note (Signed)
Addendum  created 04/18/20 1702 by Ollen Bowl, CRNA   Intraprocedure Event edited

## 2020-04-18 NOTE — Evaluation (Signed)
Physical Therapy Evaluation Patient Details Name: [name] [name] MRN: [id number] DOB: [birth year] Today's Date: 04/18/2020   History of Present Illness  Pt is a 79 y/o female s/p L TKA. PMH includes bipolar disorder, depression, fibromyalgia, HTN, CAD s/p heart cath, R THA, and lumbar fusion.   Clinical Impression  Pt is s/p surgery above with deficits below. Pt requiring min guard A for short distance gait to chair using RW. Educated about knee precautions. Pt reports she plans to go to [facility] at d/c for rehab prior to d/c home as she lives alone. Will continue to follow acutely to maximize functional mobility independence and safety.     Follow Up Recommendations Follow surgeon's recommendation for DC plan and follow-up therapies;Supervision for mobility/OOB(going to SNF ([facility]))    Equipment Recommendations  None recommended by PT    Recommendations for Other Services       Precautions / Restrictions Precautions Precautions: Knee Precaution Booklet Issued: No Precaution Comments: Verbally reviewed knee precautions.  Restrictions Weight Bearing Restrictions: Yes LLE Weight Bearing: Weight bearing as tolerated      Mobility  Bed Mobility Overal bed mobility: Needs Assistance Bed Mobility: Supine to Sit     Supine to sit: Supervision     General bed mobility comments: Supervision for safety.   Transfers Overall transfer level: Needs assistance Equipment used: Rolling walker (2 wheeled) Transfers: Sit to/from Stand Sit to Stand: Min guard         General transfer comment: Min guard for steadying assist to stand.   Ambulation/Gait Ambulation/Gait assistance: Min guard Gait Distance (Feet): 5 Feet Assistive device: Rolling walker (2 wheeled) Gait Pattern/deviations: Step-to pattern;Decreased step length - right;Decreased stance time - left;Decreased weight shift to left;Antalgic Gait velocity: Decreased   General Gait Details: Slow, antalgic gait. Limited  weightshift to LLE secondary to pain. Gait limited to chair secondary to pain.   Stairs            Wheelchair Mobility    Modified Rankin (Stroke Patients Only)       Balance Overall balance assessment: Needs assistance Sitting-balance support: No upper extremity supported;Feet supported Sitting balance-Leahy Scale: Fair     Standing balance support: Bilateral upper extremity supported;During functional activity Standing balance-Leahy Scale: Poor Standing balance comment: Reliant on BUE support                              Pertinent Vitals/Pain Pain Assessment: 0-10 Pain Score: 6  Pain Location: L knee Pain Descriptors / Indicators: Aching;Operative site guarding Pain Intervention(s): Limited activity within patient's tolerance;Monitored during session;Repositioned    Home Living Family/patient expects to be discharged to:: Skilled nursing facility                 Additional Comments: Plans to go to [facility]    Prior Function Level of Independence: Independent with assistive device(s)         Comments: was using cane for ambulation.      Hand Dominance        Extremity/Trunk Assessment   Upper Extremity Assessment Upper Extremity Assessment: Overall WFL for tasks assessed    Lower Extremity Assessment Lower Extremity Assessment: LLE deficits/detail LLE Deficits / Details: Deficits consistent with post op pain and weakness.        Communication   Communication: No difficulties  Cognition Arousal/Alertness: Awake/alert Behavior During Therapy: WFL for tasks assessed/performed Overall Cognitive Status: Within Functional Limits for tasks assessed  General Comments      Exercises Total Joint Exercises Ankle Circles/Pumps: AROM;Both;20 reps;Supine   Assessment/Plan    PT Assessment Patient needs continued PT services  PT Problem List Decreased strength;Decreased range  of motion;Decreased balance;Decreased mobility;Decreased activity tolerance;Decreased knowledge of use of DME;Decreased knowledge of precautions;Pain       PT Treatment Interventions DME instruction;Gait training;Functional mobility training;Therapeutic activities;Therapeutic exercise;Balance training;Patient/family education    PT Goals (Current goals can be found in the Care Plan section)  Acute Rehab PT Goals Patient Stated Goal: to go to rehab before home PT Goal Formulation: With patient Time For Goal Achievement: 05/02/20 Potential to Achieve Goals: Good    Frequency 7X/week   Barriers to discharge        Co-evaluation               AM-PAC PT "6 Clicks" Mobility  Outcome Measure Help needed turning from your back to your side while in a flat bed without using bedrails?: None Help needed moving from lying on your back to sitting on the side of a flat bed without using bedrails?: None Help needed moving to and from a bed to a chair (including a wheelchair)?: A Little Help needed standing up from a chair using your arms (e.g., wheelchair or bedside chair)?: A Little Help needed to walk in hospital room?: A Little Help needed climbing 3-5 steps with a railing? : A Lot 6 Click Score: 19    End of Session Equipment Utilized During Treatment: Gait belt Activity Tolerance: Patient limited by pain Patient left: in chair;with call bell/phone within reach;with chair alarm set Nurse Communication: Mobility status;Patient requests pain meds PT Visit Diagnosis: Unsteadiness on feet (R26.81);Other abnormalities of gait and mobility (R26.89);Pain Pain - Right/Left: Left Pain - part of body: Knee    Time: 3299-2426 PT Time Calculation (min) (ACUTE ONLY): 22 min   Charges:   PT Evaluation $PT Eval Low Complexity: 1 Low          Lou Miner, DPT  Acute Rehabilitation Services  Pager: [phone] Office: [phone]   Rudean Hitt 04/18/2020, 4:57  PM

## 2020-04-18 NOTE — Plan of Care (Signed)

## 2020-04-18 NOTE — Transfer of Care (Signed)
Immediate Anesthesia Transfer of Care Note  Patient: [name] [name]  Procedure(s) Performed: LEFT TOTAL KNEE ARTHROPLASTY (Left Knee)  Patient Location: PACU  Anesthesia Type:Spinal and MAC combined with regional for post-op pain  Level of Consciousness: awake, alert  and oriented  Airway & Oxygen Therapy: Patient Spontanous Breathing and Patient connected to nasal cannula oxygen  Post-op Assessment: Report given to RN and Post -op Vital signs reviewed and stable  Post vital signs: Reviewed and stable  Last Vitals:  Vitals Value Taken Time  BP 117/61 04/18/20 1006  Temp    Pulse 93 04/18/20 1008  Resp 10 04/18/20 1008  SpO2 97 % 04/18/20 1008  Vitals shown include unvalidated device data.  Last Pain:  Vitals:   04/18/20 0643  PainSc: 1       Patients Stated Pain Goal: 0 (09/05/84 2778)  Complications: No apparent anesthesia complications

## 2020-04-18 NOTE — Op Note (Signed)
Total Knee Arthroplasty Procedure Note  Preoperative diagnosis: Left knee osteoarthritis  Postoperative diagnosis:same  Operative procedure: Left total knee arthroplasty. CPT [phone]  Surgeon: N. Eduard Roux, MD  Assist: none  Anesthesia: Spinal, regional, local  Tourniquet time: see anesthesia record  Implants used: [employer] Legion Femur: PS 4 Tibia: 3 Patella: 29 mm Polyethylene: 11 mm high flexion  Indication: [name] [name] is a 79 y.o. year old female with a history of knee pain. Having failed conservative management, the patient elected to proceed with a total knee arthroplasty.  We have reviewed the risk and benefits of the surgery and they elected to proceed after voicing understanding.  Procedure:  After informed consent was obtained and understanding of the risk were voiced including but not limited to bleeding, infection, damage to surrounding structures including nerves and vessels, blood clots, leg length inequality and the failure to achieve desired results, the operative extremity was marked with verbal confirmation of the patient in the holding area.   The patient was then brought to the operating room and transported to the operating room table in the supine position.  A tourniquet was applied to the operative extremity around the upper thigh. The operative limb was then prepped and draped in the usual sterile fashion and preoperative antibiotics were administered.  A time out was performed prior to the start of surgery confirming the correct extremity, preoperative antibiotic administration, as well as team members, implants and instruments available for the case. Correct surgical site was also confirmed with preoperative radiographs. The limb was then elevated for exsanguination and the tourniquet was inflated. A midline incision was made and a standard medial parapatellar approach was performed.  The infrapatellar fat pad was removed.  Suprapatellar synovium  was removed to reveal the anterior distal femoral cortex.  A medial peel was performed to release the capsule of the medial tibial plateau.  The patella was then everted and was prepared and sized to a 29 mm.  A cover was placed on the patella for protection from retractors.  The knee was then brought into full flexion and we then turned our attention to the femur.  The cruciates were sacrificed.  Start site was drilled in the femur and the intramedullary distal femoral cutting guide was placed, set at 5 degrees valgus, taking 11 mm of distal resection. The distal cut was made. Osteophytes were then removed.  The bone was sclerotic.  Next, the proximal tibial cutting guide was placed with appropriate slope, varus/valgus alignment and depth of resection. The proximal tibial cut was made. Gap blocks were then used to assess the extension gap and alignment, and appropriate soft tissue releases were performed. Attention was turned back to the femur, which was sized using the sizing guide to a size 4. Appropriate rotation of the femoral component was determined using epicondylar axis, Whiteside's line, and assessing the flexion gap under ligament tension. The appropriate size 4-in-1 cutting block was placed and checked with an angel wing and cuts were made. Posterior femoral osteophytes and uncapped bone were then removed with the curved osteotome.  Trial components were placed, and stability was checked in full extension, mid-flexion, and deep flexion. Proper tibial rotation was determined and marked.  The patella tracked well without a lateral release.  The femoral lugs were then drilled. Trial components were then removed and tibial preparation performed.  The tibia was sized for a size 3 component.  A posterior capsular injection comprising of 20 cc of 1.3% exparel,  20 cc of 0.25% bupivicaine with epi and 20 cc of normal saline was performed for postoperative pain control. The bony surfaces were irrigated with a  pulse lavage and then dried. Bone cement was vacuum mixed on the back table, and the final components sized above were cemented into place.  Antibiotic irrigation was placed in the knee joint and soft tissues while the cement cured.  After cement had finished curing, excess cement was removed. The stability of the construct was re-evaluated throughout a range of motion and found to be acceptable. The trial liner was removed, the knee was copiously irrigated, and the knee was re-evaluated for any excess bone debris. The real polyethylene liner, 11 mm thick, was inserted and checked to ensure the locking mechanism had engaged appropriately. The tourniquet was deflated and hemostasis was achieved. The wound was irrigated with normal saline.  One gram of vancomycin powder was placed in the surgical bed.  Capsular closure was performed with a #1 vicryl, subcutaneous fat closed with a 0 vicryl suture, then subcutaneous tissue closed with interrupted 2.0 vicryl suture. The skin was then closed with a 2.0 nylon and dermabond. A sterile dressing was applied.  The patient was awakened in the operating room and taken to recovery in stable condition. All sponge, needle, and instrument counts were correct at the end of the case.  Position: supine  Complications: none.  Time Out: performed   Drains/Packing: none  Estimated blood loss: minimal  Returned to Recovery Room: in good condition.   Antibiotics: yes   Mechanical VTE (DVT) Prophylaxis: sequential compression devices, TED thigh-high  Chemical VTE (DVT) Prophylaxis: aspirin  Fluid Replacement  Crystalloid: see anesthesia record Blood: none  FFP: none   Specimens Removed: 1 to pathology   Sponge and Instrument Count Correct? yes   PACU: portable radiograph - knee AP and Lateral   Plan/RTC: Return in 2 weeks for wound check.   Weight Bearing/Load Lower Extremity: full   N. Eduard Roux, MD [facility] 9:13 AM

## 2020-04-18 NOTE — Anesthesia Postprocedure Evaluation (Signed)
Anesthesia Post Note  Patient: [name] [name]  Procedure(s) Performed: LEFT TOTAL KNEE ARTHROPLASTY (Left Knee)     Patient location during evaluation: PACU Anesthesia Type: MAC, Spinal and Regional Level of consciousness: oriented and awake and alert Pain management: pain level controlled Vital Signs Assessment: post-procedure vital signs reviewed and stable Respiratory status: spontaneous breathing and respiratory function stable Cardiovascular status: blood pressure returned to baseline and stable Postop Assessment: no headache, no backache, no apparent nausea or vomiting, patient able to bend at knees and spinal receding Anesthetic complications: no    Last Vitals:  Vitals:   04/18/20 1205 04/18/20 1235  BP: 129/75 129/72  Pulse: (!) 104 (!) 106  Resp: 13 11  Temp:  36.8 C  SpO2: 94% 94%    Last Pain:  Vitals:   04/18/20 1205  PainSc: Asleep                 Jaylen Knope,W. EDMOND

## 2020-04-18 NOTE — Anesthesia Procedure Notes (Signed)
Spinal  Patient location during procedure: OR Start time: 04/18/2020 7:21 AM End time: 04/18/2020 7:31 AM Staffing Performed: anesthesiologist  Anesthesiologist: Roderic Palau, MD Preanesthetic Checklist Completed: patient identified, IV checked, risks and benefits discussed, surgical consent, monitors and equipment checked, pre-op evaluation and timeout performed Spinal Block Patient position: sitting Prep: DuraPrep Patient monitoring: cardiac monitor, continuous pulse ox and blood pressure Approach: right paramedian Location: L2-3 Injection technique: single-shot Needle Needle type: Quincke (Attempted Pencan 24G. Unable to locate space.)  Needle gauge: 22 G Needle length: 9 cm Assessment Sensory level: T8 Events: paresthesia and R sided parasthesia with needle placement. Needle repositioned. Parasthesia resolved. Additional Notes Functioning IV was confirmed and monitors were applied. Sterile prep and drape, including hand hygiene and sterile gloves were used. The patient was positioned and the spine was prepped. The skin was anesthetized with lidocaine.  Free flow of clear CSF was obtained prior to injecting local anesthetic into the CSF.  The spinal needle aspirated freely following injection.  The needle was carefully withdrawn.  The patient tolerated the procedure well.

## 2020-04-19 ENCOUNTER — Encounter: Payer: Self-pay | Admitting: *Deleted

## 2020-04-19 DIAGNOSIS — M797 Fibromyalgia: Secondary | ICD-10-CM | POA: Diagnosis not present

## 2020-04-19 DIAGNOSIS — D649 Anemia, unspecified: Secondary | ICD-10-CM | POA: Diagnosis not present

## 2020-04-19 DIAGNOSIS — M1712 Unilateral primary osteoarthritis, left knee: Secondary | ICD-10-CM | POA: Diagnosis not present

## 2020-04-19 DIAGNOSIS — F319 Bipolar disorder, unspecified: Secondary | ICD-10-CM | POA: Diagnosis not present

## 2020-04-19 DIAGNOSIS — E039 Hypothyroidism, unspecified: Secondary | ICD-10-CM | POA: Diagnosis not present

## 2020-04-19 DIAGNOSIS — I10 Essential (primary) hypertension: Secondary | ICD-10-CM | POA: Diagnosis not present

## 2020-04-19 DIAGNOSIS — F419 Anxiety disorder, unspecified: Secondary | ICD-10-CM | POA: Diagnosis not present

## 2020-04-19 DIAGNOSIS — K219 Gastro-esophageal reflux disease without esophagitis: Secondary | ICD-10-CM | POA: Diagnosis not present

## 2020-04-19 DIAGNOSIS — Z20822 Contact with and (suspected) exposure to covid-19: Secondary | ICD-10-CM | POA: Diagnosis not present

## 2020-04-19 LAB — CBC
HCT: 32.4 % — ABNORMAL LOW (ref 36.0–46.0)
Hemoglobin: 10.6 g/dL — ABNORMAL LOW (ref 12.0–15.0)
MCH: 33 pg (ref 26.0–34.0)
MCHC: 32.7 g/dL (ref 30.0–36.0)
MCV: 100.9 fL — ABNORMAL HIGH (ref 80.0–100.0)
Platelets: 222 10*3/uL (ref 150–400)
RBC: 3.21 MIL/uL — ABNORMAL LOW (ref 3.87–5.11)
RDW: 13.2 % (ref 11.5–15.5)
WBC: 9 10*3/uL (ref 4.0–10.5)
nRBC: 0 % (ref 0.0–0.2)

## 2020-04-19 LAB — BASIC METABOLIC PANEL
Anion gap: 8 (ref 5–15)
BUN: 5 mg/dL — ABNORMAL LOW (ref 8–23)
CO2: 29 mmol/L (ref 22–32)
Calcium: 8.3 mg/dL — ABNORMAL LOW (ref 8.9–10.3)
Chloride: 99 mmol/L (ref 98–111)
Creatinine, Ser: 0.64 mg/dL (ref 0.44–1.00)
GFR calc Af Amer: >60 mL/min (ref >60)
GFR calc non Af Amer: >60 mL/min (ref >60)
Glucose, Bld: 116 mg/dL — ABNORMAL HIGH (ref 70–99)
Potassium: 4 mmol/L (ref 3.5–5.1)
Sodium: 136 mmol/L (ref 135–145)

## 2020-04-19 LAB — SARS CORONAVIRUS 2 BY RT PCR (HOSPITAL ORDER, PERFORMED IN ~~LOC~~ HOSPITAL LAB): SARS Coronavirus 2: NEGATIVE

## 2020-04-19 MED ORDER — ASPIRIN EC 81 MG PO TBEC
81.0000 mg | DELAYED_RELEASE_TABLET | Freq: Two times a day (BID) | ORAL | 0 refills | Status: DC
Start: 2020-04-19 — End: 2021-04-03

## 2020-04-19 MED ORDER — OXYCODONE-ACETAMINOPHEN 5-325 MG PO TABS: 1.0000 | ORAL_TABLET | Freq: Three times a day (TID) | ORAL | 0 refills | Status: DC | PRN

## 2020-04-19 MED ORDER — METHOCARBAMOL 500 MG PO TABS
500.0000 mg | ORAL_TABLET | Freq: Four times a day (QID) | ORAL | 2 refills | Status: DC | PRN
Start: 2020-04-19 — End: 2020-08-31

## 2020-04-19 NOTE — NC FL2 (Signed)
[place] LEVEL OF CARE SCREENING TOOL     IDENTIFICATION  Patient Name: [name] [name] Birthdate: Apr 16, 1941 Sex: female Admission Date (Current Location): 04/18/2020  [facility] and [place] Number:  Herbalist and Address:  [facility], [place] [address] [place], [place] [address]      Provider Number: [id number]  Attending Physician Name and Address:  Leandrew Koyanagi, MD  Relative Name and Phone Number:       Current Level of Care: Hospital Recommended Level of Care: [place] Prior Approval Number:    Date Approved/Denied:   PASRR Number: Pending  Discharge Plan: SNF    Current Diagnoses: Patient Active Problem List   Diagnosis Date Noted  . Status post total left knee replacement 04/18/2020  . Primary osteoarthritis of left knee 04/17/2020  . Scalp laceration 03/01/2020  . Abdominal pain, epigastric 08/14/2019  . Midsternal chest pain 08/19/2015  . Bipolar disorder ([place])   . Hypothyroidism 08/04/2015  . Chronic cough 11/23/2014  . Fecal incontinence 11/23/2014  . Constipation 09/22/2013  . H/O adenomatous polyp of colon 09/22/2013  . Muscle weakness (generalized) 04/27/2013  . Chronic fatigue 03/05/2013  . Chronic pain syndrome 03/05/2013  . Vitamin D deficiency 03/05/2013  . Insomnia 03/05/2013  . Hot flashes 03/05/2013  . History of gastroesophageal reflux (GERD) 03/05/2013  . History of back surgery 03/05/2013  . H/O anxiety disorder 03/05/2013  . HTN (hypertension) 10/15/2011  . Difficulty in walking(719.7) 07/26/2011  . Hyperlipidemia 05/11/2009  . BIPOLAR AFFECTIVE DISORDER 05/11/2009  . Coronary artery disease 05/11/2009  . GERD 05/11/2009  . Fibromyalgia 05/11/2009  . DIZZINESS 05/11/2009  . Chest pain 05/11/2009    Orientation RESPIRATION BLADDER Height & Weight     Self, Time, Situation, Place  Normal Continent Weight: 119 lb 0.8 oz (54 kg) Height:  4\' 8"  (142.2 cm)  BEHAVIORAL  SYMPTOMS/MOOD NEUROLOGICAL BOWEL NUTRITION STATUS      Continent Diet(refer to d/c summary)  AMBULATORY STATUS COMMUNICATION OF NEEDS Skin   Extensive Assist   Surgical wounds                       Personal Care Assistance Level of Assistance  Bathing, Feeding, Dressing Bathing Assistance: Limited assistance Feeding assistance: Independent Dressing Assistance: Limited assistance     Functional Limitations Info  Sight, Hearing, Speech Sight Info: Adequate Hearing Info: Adequate Speech Info: Adequate    SPECIAL CARE FACTORS FREQUENCY  PT (By licensed PT), OT (By licensed OT)     PT Frequency: 5 x/ week, evalute and treat OT Frequency: 5 x/ week, evalute and treat            Contractures Contractures Info: Not present    Additional Factors Info  Psychotropic Code Status Info: Full Code Allergies Info: Hydrocodone-acetaminophen Psychotropic Info: Wellbutrin; Cymbalta;Lamictal;Risperdal         Current Medications (04/19/2020):  This is the current hospital active medication list Current Facility-Administered Medications  Medication Dose Route Frequency Provider Last Rate Last Admin  . 0.9 %  sodium chloride infusion   Intravenous Continuous Leandrew Koyanagi, MD   Stopped at 04/18/20 1735  . acetaminophen (TYLENOL) tablet 325-650 mg  325-650 mg Oral Q6H PRN Leandrew Koyanagi, MD      . ALPRAZolam Duanne Moron) tablet 0.25-0.5 mg  0.25-0.5 mg Oral Daily PRN Leandrew Koyanagi, MD      . alum & mag hydroxide-simeth (MAALOX/MYLANTA) 200-200-20 MG/5ML suspension  30 mL  30 mL Oral Q4H PRN Leandrew Koyanagi, MD      . aspirin chewable tablet 81 mg  81 mg Oral BID Leandrew Koyanagi, MD   81 mg at 04/19/20 0942  . atorvastatin (LIPITOR) tablet 40 mg  40 mg Oral QHS Leandrew Koyanagi, MD   40 mg at 04/18/20 2118  . buPROPion (WELLBUTRIN XL) 24 hr tablet 300 mg  300 mg Oral Daily Leandrew Koyanagi, MD   300 mg at 04/19/20 0943  . celecoxib (CELEBREX) capsule 200 mg  200 mg Oral BID Leandrew Koyanagi, MD   200 mg at  04/19/20 0942  . diphenhydrAMINE (BENADRYL) 12.5 MG/5ML elixir 25 mg  25 mg Oral Q4H PRN Leandrew Koyanagi, MD      . docusate sodium (COLACE) capsule 100 mg  100 mg Oral BID Leandrew Koyanagi, MD   100 mg at 04/19/20 0942  . DULoxetine (CYMBALTA) DR capsule 120 mg  120 mg Oral Daily Leandrew Koyanagi, MD   120 mg at 04/19/20 0941  . estradiol (ESTRACE) tablet 0.5 mg  0.5 mg Oral Daily Leandrew Koyanagi, MD   0.5 mg at 04/19/20 0942  . gabapentin (NEURONTIN) capsule 300 mg  300 mg Oral TID Leandrew Koyanagi, MD   300 mg at 04/19/20 0941  . HYDROmorphone (DILAUDID) injection 0.5-1 mg  0.5-1 mg Intravenous Q4H PRN Leandrew Koyanagi, MD   1 mg at 04/18/20 1954  . lamoTRIgine (LAMICTAL) tablet 150 mg  150 mg Oral QHS Leandrew Koyanagi, MD   150 mg at 04/18/20 2118  . levothyroxine (SYNTHROID) tablet 25 mcg  25 mcg Oral Q0600 Leandrew Koyanagi, MD   25 mcg at 04/19/20 0606  . losartan (COZAAR) tablet 50 mg  50 mg Oral Daily Leandrew Koyanagi, MD   50 mg at 04/19/20 0942  . magnesium citrate solution 1 Bottle  1 Bottle Oral Once PRN Leandrew Koyanagi, MD      . menthol-cetylpyridinium (CEPACOL) lozenge 3 mg  1 lozenge Oral PRN Leandrew Koyanagi, MD       Or  . phenol (CHLORASEPTIC) mouth spray 1 spray  1 spray Mouth/Throat PRN Leandrew Koyanagi, MD      . metoCLOPramide (REGLAN) tablet 5-10 mg  5-10 mg Oral Q8H PRN Leandrew Koyanagi, MD       Or  . metoCLOPramide (REGLAN) injection 5-10 mg  5-10 mg Intravenous Q8H PRN Leandrew Koyanagi, MD      . ondansetron [facility]) tablet 4 mg  4 mg Oral Q6H PRN Leandrew Koyanagi, MD       Or  . ondansetron [facility]) injection 4 mg  4 mg Intravenous Q6H PRN Leandrew Koyanagi, MD      . oxyCODONE (Oxy IR/ROXICODONE) immediate release tablet 10-15 mg  10-15 mg Oral Q4H PRN Leandrew Koyanagi, MD   10 mg at 04/19/20 0606  . oxyCODONE (Oxy IR/ROXICODONE) immediate release tablet 5-10 mg  5-10 mg Oral Q4H PRN Leandrew Koyanagi, MD   10 mg at 04/19/20 1148  . oxyCODONE (OXYCONTIN) 12 hr tablet 10 mg  10 mg Oral Q12H Leandrew Koyanagi, MD   10 mg  at 04/19/20 0942  . polyethylene glycol (MIRALAX / GLYCOLAX) packet 17 g  17 g Oral Daily PRN Leandrew Koyanagi, MD      . risperiDONE (RISPERDAL) tablet 0.5 mg  0.5 mg Oral QHS Leandrew Koyanagi, MD   0.5  mg at 04/18/20 2118  . sorbitol 70 % solution 30 mL  30 mL Oral Daily PRN Leandrew Koyanagi, MD         Discharge Medications: Please see discharge summary for a list of discharge medications.  Relevant Imaging Results:  Relevant Lab Results:   Additional Information SS# [id number]  Benard Halsted, LCSW

## 2020-04-19 NOTE — Progress Notes (Signed)
Physical Therapy Treatment Patient Details Name: [name] [name] MRN: [id number] DOB: Aug 18, 1941 Today's Date: 04/19/2020    History of Present Illness Pt is a 79 y/o female s/p L TKA. PMH includes bipolar disorder, depression, fibromyalgia, HTN, CAD s/p heart cath, R THA, and lumbar fusion.     PT Comments    Pt supine in bed on arrival in CPM machine.  RN reports desat on RA earlier so tx performed on [facility] with SPO2 95% or greater.  Pt continues to require supervision to min guard.  Able to progress gt distance with youth height RW.  Plan for d/c to SNF as she lives home alone and will need to be able to function without assistance.     Follow Up Recommendations  Follow surgeon's recommendation for DC plan and follow-up therapies;Supervision for mobility/OOB     Equipment Recommendations  None recommended by PT    Recommendations for Other Services       Precautions / Restrictions Precautions Precautions: Knee Precaution Booklet Issued: No Precaution Comments: Verbally reviewed knee precautions.  Restrictions Weight Bearing Restrictions: Yes LLE Weight Bearing: Weight bearing as tolerated    Mobility  Bed Mobility Overal bed mobility: Needs Assistance Bed Mobility: Supine to Sit;Sit to Supine     Supine to sit: Supervision     General bed mobility comments: Increased time and supervision for safety  Transfers Overall transfer level: Needs assistance Equipment used: Rolling walker (2 wheeled)(youth height) Transfers: Sit to/from Stand Sit to Stand: Min guard         General transfer comment: min guard from bed and bed side commode.  Cues for hand placement to ensure safety.  Ambulation/Gait Ambulation/Gait assistance: Min guard Gait Distance (Feet): 80 Feet Assistive device: Rolling walker (2 wheeled) Gait Pattern/deviations: Decreased step length - right;Decreased stance time - left;Decreased weight shift to left;Antalgic;Step-through pattern;Trunk flexed      General Gait Details: Cues for upper trunk control and RW safety with turns and backing.   Stairs             Wheelchair Mobility    Modified Rankin (Stroke Patients Only)       Balance Overall balance assessment: Needs assistance Sitting-balance support: No upper extremity supported;Feet supported Sitting balance-Leahy Scale: Fair       Standing balance-Leahy Scale: Poor                              Cognition Arousal/Alertness: Awake/alert Behavior During Therapy: WFL for tasks assessed/performed Overall Cognitive Status: Within Functional Limits for tasks assessed                                        Exercises Total Joint Exercises Ankle Circles/Pumps: AROM;Both;20 reps;Supine Quad Sets: AROM;Left;10 reps;Supine Heel Slides: AROM;Left;10 reps;Supine Hip ABduction/ADduction: AROM;Left;10 reps;Supine Straight Leg Raises: AROM;Left;10 reps;Supine Goniometric ROM: 3-96 degrees flexion L knee.    General Comments        Pertinent Vitals/Pain Pain Assessment: 0-10 Pain Location: L knee Pain Descriptors / Indicators: Aching;Operative site guarding Pain Intervention(s): Monitored during session;Repositioned    Home Living                      Prior Function            PT Goals (current goals can now be found in the care  plan section) Acute Rehab PT Goals Patient Stated Goal: to go to rehab before home Potential to Achieve Goals: Good Progress towards PT goals: Progressing toward goals    Frequency    7X/week      PT Plan Current plan remains appropriate    Co-evaluation              AM-PAC PT "6 Clicks" Mobility   Outcome Measure  Help needed turning from your back to your side while in a flat bed without using bedrails?: None Help needed moving from lying on your back to sitting on the side of a flat bed without using bedrails?: None Help needed moving to and from a bed to a chair  (including a wheelchair)?: A Little Help needed standing up from a chair using your arms (e.g., wheelchair or bedside chair)?: A Little Help needed to walk in hospital room?: A Little Help needed climbing 3-5 steps with a railing? : A Little 6 Click Score: 20    End of Session Equipment Utilized During Treatment: Gait belt Activity Tolerance: Patient tolerated treatment well Patient left: in chair;with call bell/phone within reach;with chair alarm set Nurse Communication: Mobility status;Patient requests pain meds PT Visit Diagnosis: Unsteadiness on feet (R26.81);Other abnormalities of gait and mobility (R26.89);Pain Pain - Right/Left: Left Pain - part of body: Knee     Time: 6599-3570 PT Time Calculation (min) (ACUTE ONLY): 38 min  Charges:  $Gait Training: 8-22 mins $Therapeutic Exercise: 8-22 mins $Therapeutic Activity: 8-22 mins                     Erasmo Leventhal , PTA Acute Rehabilitation Services Pager [phone] Office [phone]     Samar Dass Eli Hose 04/19/2020, 11:12 AM

## 2020-04-19 NOTE — Plan of Care (Signed)

## 2020-04-19 NOTE — Progress Notes (Signed)
RE: [name] [name]  Date of Birth: Jun 06, 2041   Please be advised that the above-named patient will require a short-term nursing home stay - anticipated 30 days or less for rehabilitation and strengthening. The plan is for return home.

## 2020-04-19 NOTE — Discharge Summary (Addendum)
Patient ID: [name] [name] MRN: [id number] DOB/AGE: 1940/12/15 79 y.o.  Admit date: 04/18/2020 Discharge date: 04/20/2020  Admission Diagnoses:  Primary osteoarthritis of left knee  Discharge Diagnoses:  Principal Problem:   Primary osteoarthritis of left knee Active Problems:   Status post total left knee replacement   Past Medical History:  Diagnosis Date  . Anemia   . Anxiety   . Arthritis   . Bipolar affective disorder ([place])   . Cataract   . Chronic back pain   . Chronic fatigue   . Complication of anesthesia   . Constipation   . Depression   . Dizziness   . Dyslipidemia   . Fibromyalgia   . GERD (gastroesophageal reflux disease)   . H/O: hysterectomy   . History of kidney stones   . Hx of adenomatous colonic polyps 2005  . Hyperlipidemia   . Hypertension   . Hypothyroidism   . Knee pain   . Non-obstructive CAD    a. 08/2015 Cath: LM 40, LCX small, 30ost, 82m, OM2 small, RCA nl/small, EF 55-65%.  . Osteopenia   . PONV (postoperative nausea and vomiting)   . Sciatica     Surgeries: Procedure(s): LEFT TOTAL KNEE ARTHROPLASTY on 04/18/2020   Consultants (if any):   Discharged Condition: Improved  Hospital Course: [name] [name] is an 79 y.o. female who was admitted 04/18/2020 with a diagnosis of Primary osteoarthritis of left knee and went to the operating room on 04/18/2020 and underwent the above named procedures.    She was given perioperative antibiotics:  Anti-infectives (From admission, onward)   Start     Dose/Rate Route Frequency Ordered Stop   04/18/20 1300  ceFAZolin (ANCEF) IVPB 2g/100 mL premix     2 g 200 mL/hr over 30 Minutes Intravenous Every 6 hours 04/18/20 1250 04/19/20 0111   04/18/20 0816  vancomycin (VANCOCIN) powder  Status:  Discontinued       As needed 04/18/20 0817 04/18/20 1003   04/18/20 0645  ceFAZolin (ANCEF) IVPB 2g/100 mL premix     2 g 200 mL/hr over 30 Minutes Intravenous On call to O.R. 04/18/20 2706 04/18/20 2376      .  She was given sequential compression devices, early ambulation, and appropriate chemoprophylaxis for DVT prophylaxis.  She benefited maximally from the hospital stay and there were no complications.    Recent vital signs:  Vitals:   04/19/20 1938 04/20/20 0605  BP: 123/64 (!) 128/58  Pulse: (!) 103 82  Resp: 16 16  Temp: 98.3 F (36.8 C) (!) 97.5 F (36.4 C)  SpO2: 94% 100%    Recent laboratory studies:  Lab Results  Component Value Date   HGB 10.6 (L) 04/19/2020   HGB 13.8 04/14/2020   HGB 14.0 04/17/2019   Lab Results  Component Value Date   WBC 9.0 04/19/2020   PLT 222 04/19/2020   Lab Results  Component Value Date   INR 0.9 04/14/2020   Lab Results  Component Value Date   NA 136 04/19/2020   K 4.0 04/19/2020   CL 99 04/19/2020   CO2 29 04/19/2020   BUN 5 (L) 04/19/2020   CREATININE 0.64 04/19/2020   GLUCOSE 116 (H) 04/19/2020    Discharge Medications:   Allergies as of 04/20/2020      Reactions   Hydrocodone-acetaminophen Hives, Itching      Medication List    STOP taking these medications   aspirin 81 MG tablet Replaced by: aspirin EC 81  MG tablet     TAKE these medications   ALPRAZolam 0.5 MG tablet Commonly known as: XANAX Take 0.25-0.5 mg by mouth daily as needed for anxiety.   aspirin EC 81 MG tablet Take 1 tablet (81 mg total) by mouth 2 (two) times daily. Replaces: aspirin 81 MG tablet   atorvastatin 40 MG tablet Commonly known as: LIPITOR TAKE (1) TABLET BY MOUTH AT BEDTIME. What changed: See the new instructions.   CALCIUM/VITAMIN D PO Take 1 tablet by mouth daily.   DULoxetine 60 MG capsule Commonly known as: CYMBALTA Take 120 mg by mouth daily.   estradiol 1 MG tablet Commonly known as: ESTRACE TAKE 1/2 TABLET BY MOUTH DAILY. What changed:   how much to take  how to take this  when to take this  additional instructions   Fish Oil 1000 MG Caps Take 1,000 mg by mouth daily.   lamoTRIgine 150 MG  tablet Commonly known as: LAMICTAL Take 150 mg by mouth at bedtime.   levothyroxine 25 MCG tablet Commonly known as: SYNTHROID Take 1 tablet (25 mcg total) by mouth daily. What changed: additional instructions   losartan 50 MG tablet Commonly known as: COZAAR Take 1 tablet (50 mg total) by mouth daily.   meloxicam 7.5 MG tablet Commonly known as: MOBIC Take 7.5 mg by mouth 2 (two) times daily.   methocarbamol 500 MG tablet Commonly known as: ROBAXIN Take 1 tablet (500 mg total) by mouth every 6 (six) hours as needed for muscle spasms.   multivitamin tablet Take 1 tablet by mouth daily.   niacin 500 MG tablet Commonly known as: SLO-NIACIN Take 500 mg by mouth daily.   nitroGLYCERIN 0.4 MG SL tablet Commonly known as: Nitrostat PLACE 1 TAB UNDER TONGUE EVERY 5 MIN IF NEEDED FOR CHEST PAIN. MAY USE 3 TIMES.NO RELIEF CALL 911. What changed:   how much to take  how to take this  when to take this  reasons to take this   oxyCODONE-acetaminophen 5-325 MG tablet Commonly known as: Percocet Take 1-2 tablets by mouth every 8 (eight) hours as needed for severe pain.   PROBIOTIC DAILY PO Take 1 tablet by mouth daily.   risperiDONE 1 MG tablet Commonly known as: RISPERDAL Take 0.5 mg by mouth at bedtime.   TAGAMET PO Take 1 tablet by mouth daily.   Vitamin D 50 MCG (2000 UT) Caps Take 2,000 Units by mouth daily.   Wellbutrin XL 300 MG 24 hr tablet Generic drug: buPROPion Take 300 mg by mouth daily.   Womens Laxative 5 MG EC tablet Generic drug: bisacodyl Take 1 tablet by mouth daily as needed for mild constipation (constipation).            Durable Medical Equipment  (From admission, onward)         Start     Ordered   04/18/20 1251  DME Walker rolling  Once    Question:  Patient needs a walker to treat with the following condition  Answer:  Total knee replacement status   04/18/20 1250   04/18/20 1251  DME 3 n 1  Once     04/18/20 1250   04/18/20  1251  DME Bedside commode  Once    Question:  Patient needs a bedside commode to treat with the following condition  Answer:  Total knee replacement status   04/18/20 1250          Diagnostic Studies: DG Chest 2 View  Result Date: 04/15/2020 CLINICAL  DATA:  Preoperative evaluation. EXAM: CHEST - 2 VIEW COMPARISON:  08/18/2015. FINDINGS: Mediastinum hilar structures normal. Lungs are clear. No pleural effusion or pneumothorax. Heart size normal. Thoracic spine scoliosis and degenerative change. Degenerative changes right shoulder. Prior lumbar spine fusion. Left shoulder replacement. IMPRESSION: No acute cardiopulmonary disease. Electronically Signed   By: Marcello Moores  Register   On: 04/15/2020 05:40   CT Head Wo Contrast  Result Date: 04/17/2020 CLINICAL DATA:  Headache and right-sided neck pain. Fell in the shower last night. EXAM: CT HEAD WITHOUT CONTRAST CT CERVICAL SPINE WITHOUT CONTRAST TECHNIQUE: Multidetector CT imaging of the head and cervical spine was performed following the standard protocol without intravenous contrast. Multiplanar CT image reconstructions of the cervical spine were also generated. COMPARISON:  Head CT, 01/19/2020. FINDINGS: CT HEAD FINDINGS Brain: No evidence of acute infarction, hemorrhage, hydrocephalus, extra-axial collection or mass lesion/mass effect. There is patchy bilateral white matter hypoattenuation consistent with mild to moderate chronic microvascular ischemic change. Vascular: No hyperdense vessel or unexpected calcification. Skull: Normal. Negative for fracture or focal lesion. Sinuses/Orbits: Globes and orbits are unremarkable. Sinuses are clear. Other: None. CT CERVICAL SPINE FINDINGS Alignment: Grade 1 degenerative anterolisthesis of C2 on C3 and C6 on C7 and C7 on T1. No evidence of an acute vertebral subluxation. Skull base and vertebrae: No acute fracture. No primary bone lesion or focal pathologic process. Soft tissues and spinal canal: No prevertebral fluid  or swelling. No visible canal hematoma. Disc levels: Mild loss of disc height at C2-C3 and C3-C4. Moderate loss of disc height at C4-C5. Marked loss of disc height at C5-C6, C6-C7 and C7-T1. There is endplate sclerosis and irregularity at C7-T1. There are also significant bilateral facet degenerative changes throughout the cervical spine. No convincing disc herniation. Upper chest: No acute findings.  Clear lung apices. Other: None. IMPRESSION: HEAD CT 1. No acute intracranial abnormalities. 2. No skull fracture. 3. Mild to moderate chronic microvascular ischemic change. CERVICAL CT 1. No fracture or acute finding. 2. Advanced disc and facet degenerative changes. Electronically Signed   By: Lajean Manes M.D.   On: 04/17/2020 10:41   CT Cervical Spine Wo Contrast  Result Date: 04/17/2020 CLINICAL DATA:  Headache and right-sided neck pain. Fell in the shower last night. EXAM: CT HEAD WITHOUT CONTRAST CT CERVICAL SPINE WITHOUT CONTRAST TECHNIQUE: Multidetector CT imaging of the head and cervical spine was performed following the standard protocol without intravenous contrast. Multiplanar CT image reconstructions of the cervical spine were also generated. COMPARISON:  Head CT, 01/19/2020. FINDINGS: CT HEAD FINDINGS Brain: No evidence of acute infarction, hemorrhage, hydrocephalus, extra-axial collection or mass lesion/mass effect. There is patchy bilateral white matter hypoattenuation consistent with mild to moderate chronic microvascular ischemic change. Vascular: No hyperdense vessel or unexpected calcification. Skull: Normal. Negative for fracture or focal lesion. Sinuses/Orbits: Globes and orbits are unremarkable. Sinuses are clear. Other: None. CT CERVICAL SPINE FINDINGS Alignment: Grade 1 degenerative anterolisthesis of C2 on C3 and C6 on C7 and C7 on T1. No evidence of an acute vertebral subluxation. Skull base and vertebrae: No acute fracture. No primary bone lesion or focal pathologic process. Soft tissues  and spinal canal: No prevertebral fluid or swelling. No visible canal hematoma. Disc levels: Mild loss of disc height at C2-C3 and C3-C4. Moderate loss of disc height at C4-C5. Marked loss of disc height at C5-C6, C6-C7 and C7-T1. There is endplate sclerosis and irregularity at C7-T1. There are also significant bilateral facet degenerative changes throughout the cervical spine. No convincing  disc herniation. Upper chest: No acute findings.  Clear lung apices. Other: None. IMPRESSION: HEAD CT 1. No acute intracranial abnormalities. 2. No skull fracture. 3. Mild to moderate chronic microvascular ischemic change. CERVICAL CT 1. No fracture or acute finding. 2. Advanced disc and facet degenerative changes. Electronically Signed   By: Lajean Manes M.D.   On: 04/17/2020 10:41   DG Knee Left Port  Result Date: 04/18/2020 CLINICAL DATA:  Status post left total knee replacement. EXAM: PORTABLE LEFT KNEE - 1-2 VIEW COMPARISON:  Left knee x-rays dated February 02, 2020. FINDINGS: The left knee demonstrates a total knee arthroplasty without evidence of hardware failure or complication. There is expected intra-articular air. There is no fracture or dislocation. The alignment is anatomic. Post-surgical changes and subcutaneous emphysema noted in the surrounding soft tissues. IMPRESSION: 1. Interval left total knee arthroplasty without acute postoperative complication. Electronically Signed   By: Titus Dubin M.D.   On: 04/18/2020 14:05    Disposition: Discharge disposition: 03-Skilled Nursing Facility       Discharge Instructions    Call MD / Call 911   Complete by: As directed    If you experience chest pain or shortness of breath, CALL 911 and be transported to the hospital emergency room.  If you develope a fever above 101.5 F, pus (white drainage) or increased drainage or redness at the wound, or calf pain, call your surgeon's office.   Call MD / Call 911   Complete by: As directed    If you experience  chest pain or shortness of breath, CALL 911 and be transported to the hospital emergency room.  If you develope a fever above 101.5 F, pus (white drainage) or increased drainage or redness at the wound, or calf pain, call your surgeon's office.   Constipation Prevention   Complete by: As directed    Drink plenty of fluids.  Prune juice may be helpful.  You may use a stool softener, such as Colace (over the counter) 100 mg twice a day.  Use MiraLax (over the counter) for constipation as needed.   Constipation Prevention   Complete by: As directed    Drink plenty of fluids.  Prune juice may be helpful.  You may use a stool softener, such as Colace (over the counter) 100 mg twice a day.  Use MiraLax (over the counter) for constipation as needed.   Driving restrictions   Complete by: As directed    No driving while taking narcotic pain meds.   Driving restrictions   Complete by: As directed    No driving while taking narcotic pain meds.   Increase activity slowly as tolerated   Complete by: As directed    Increase activity slowly as tolerated   Complete by: As directed       Follow-up Information    Leandrew Koyanagi, MD. Go on 05/03/2020.   Specialty: Orthopedic Surgery Why: at 1:30 pm with Dr. Erlinda Hong For suture removal, For wound re-check Contact information: [address] [id number] [phone]            Signed: Eduard Roux 04/20/2020, 8:05 AM

## 2020-04-19 NOTE — Progress Notes (Signed)
   Subjective:  Patient reports pain as mild.    Objective:   VITALS:   Vitals:   04/18/20 1617 04/18/20 1958 04/18/20 2352 04/19/20 0352  BP: 114/68 117/67 108/63 (!) 112/55  Pulse: 98 87 88 86  Resp: 18 16 17 18   Temp: 97.8 F (36.6 C) 98 F (36.7 C) 97.7 F (36.5 C) 99 F (37.2 C)  TempSrc: Oral Oral Oral Oral  SpO2: 96% 98% 97% 92%  Weight:      Height:        Neurovascular intact Sensation intact distally Intact pulses distally Dorsiflexion/Plantar flexion intact Incision: dressing C/D/I and no drainage   Lab Results  Component Value Date   WBC 9.0 04/19/2020   HGB 10.6 (L) 04/19/2020   HCT 32.4 (L) 04/19/2020   MCV 100.9 (H) 04/19/2020   PLT 222 04/19/2020     Assessment/Plan:  1 Day Post-Op   - Expected postop acute blood loss anemia - will monitor for symptoms - Up with PT/OT - DVT ppx - SCDs, ambulation, aspirin - thigh high TED hose to be placed before dc to SNF - WBAT operative extremity - Pain control - Discharge planning - plan for [facility]  Eduard Roux 04/19/2020, 7:34 AM [phone]

## 2020-04-20 DIAGNOSIS — E039 Hypothyroidism, unspecified: Secondary | ICD-10-CM | POA: Diagnosis not present

## 2020-04-20 DIAGNOSIS — K219 Gastro-esophageal reflux disease without esophagitis: Secondary | ICD-10-CM | POA: Diagnosis not present

## 2020-04-20 DIAGNOSIS — I10 Essential (primary) hypertension: Secondary | ICD-10-CM | POA: Diagnosis not present

## 2020-04-20 DIAGNOSIS — M797 Fibromyalgia: Secondary | ICD-10-CM | POA: Diagnosis not present

## 2020-04-20 DIAGNOSIS — D649 Anemia, unspecified: Secondary | ICD-10-CM | POA: Diagnosis not present

## 2020-04-20 DIAGNOSIS — F419 Anxiety disorder, unspecified: Secondary | ICD-10-CM | POA: Diagnosis not present

## 2020-04-20 DIAGNOSIS — Z20822 Contact with and (suspected) exposure to covid-19: Secondary | ICD-10-CM | POA: Diagnosis not present

## 2020-04-20 DIAGNOSIS — F319 Bipolar disorder, unspecified: Secondary | ICD-10-CM | POA: Diagnosis not present

## 2020-04-20 DIAGNOSIS — M1712 Unilateral primary osteoarthritis, left knee: Secondary | ICD-10-CM | POA: Diagnosis not present

## 2020-04-20 NOTE — TOC Progression Note (Signed)
Transition of Care [facility]) - Progression Note    Patient Details  Name: ARMILDA VANDERLINDEN MRN: [id number] Date of Birth: 01-Nov-1941  Transition of Care [facility]) CM/SW Contact  Sharin Mons, RN Phone Number: 04/20/2020, 11:22 AM  Clinical Narrative:    Awaiting PASRR and insurance authorization ... TOC team will continue to monitor ....   Expected Discharge Plan: Skilled Nursing Facility Barriers to Discharge: [place] [facility]), Insurance Authorization  Expected Discharge Plan and Services Expected Discharge Plan: [place]         Expected Discharge Date: 04/19/20               DME Arranged: Continuous passive motion machine DME Agency: [place]                   Social Determinants of Health (SDOH) Interventions    Readmission Risk Interventions No flowsheet data found.

## 2020-04-20 NOTE — Plan of Care (Addendum)
Pt placed on CPM 2000-2200, tolerated well.    Problem: Education: Goal: Knowledge of General Education information will improve Description: Including pain rating scale, medication(s)/side effects and non-pharmacologic comfort measures Outcome: Progressing   Problem: Pain Managment: Goal: General experience of comfort will improve Outcome: Progressing   Problem: Safety: Goal: Ability to remain free from injury will improve Outcome: Progressing   Problem: Skin Integrity: Goal: Risk for impaired skin integrity will decrease Outcome: Progressing

## 2020-04-20 NOTE — Plan of Care (Addendum)
Pt place on CPM from 2000 to 2200 and 0500 to 0700.   Problem: Education: Goal: Knowledge of General Education information will improve Description: Including pain rating scale, medication(s)/side effects and non-pharmacologic comfort measures Outcome: Progressing   Problem: Activity: Goal: Risk for activity intolerance will decrease Outcome: Progressing   Problem: Pain Managment: Goal: General experience of comfort will improve Outcome: Progressing   Problem: Safety: Goal: Ability to remain free from injury will improve Outcome: Progressing   Problem: Skin Integrity: Goal: Risk for impaired skin integrity will decrease Outcome: Progressing

## 2020-04-20 NOTE — Progress Notes (Signed)
Physical Therapy Treatment Patient Details Name: [name] [name] MRN: [id number] DOB: November 14, 1940 Today's Date: 04/20/2020    History of Present Illness Pt is a 79 y/o female s/p L TKA. PMH includes bipolar disorder, depression, fibromyalgia, HTN, CAD s/p heart cath, R THA, and lumbar fusion.     PT Comments    Pt seated in recliner on arrival. She reports she had a rough night managing pain but pain is better this am.  Pt tolerated LE strengthening and gt progression.  Currently requiring min guard to min assistance to mobilize.  Pt continues to benefit from placement post d/c to maximize functional gains before returning home.  Continue to follow surgeon recommendations.      Follow Up Recommendations  Follow surgeon's recommendation for DC plan and follow-up therapies;Supervision for mobility/OOB     Equipment Recommendations  None recommended by PT    Recommendations for Other Services       Precautions / Restrictions Precautions Precautions: Knee Precaution Booklet Issued: No Precaution Comments: Verbally reviewed knee precautions.  Restrictions Weight Bearing Restrictions: Yes LLE Weight Bearing: Weight bearing as tolerated    Mobility  Bed Mobility Overal bed mobility: Needs Assistance Bed Mobility: Sit to Supine       Sit to supine: Supervision   General bed mobility comments: Able to return back to bed with surpervision for safety.  Cues to boost to head of bed for optimal positioning.  Transfers Overall transfer level: Needs assistance Equipment used: Rolling walker (2 wheeled)(youth height) Transfers: Sit to/from Stand Sit to Stand: Min guard         General transfer comment: Cues for hand placement to and from seated surface.  Performed transfer from recliner and commode with good tolerance.  Ambulation/Gait Ambulation/Gait assistance: Min guard;Min assist Gait Distance (Feet): 100 Feet(x2) Assistive device: Rolling walker (2 wheeled) Gait  Pattern/deviations: Decreased step length - right;Decreased stance time - left;Decreased weight shift to left;Antalgic;Step-through pattern;Trunk flexed     General Gait Details: Cues for upper trunk control and RW safety with turns and backing.  Min assistance for backing.   Stairs             Wheelchair Mobility    Modified Rankin (Stroke Patients Only)       Balance Overall balance assessment: Needs assistance Sitting-balance support: No upper extremity supported;Feet supported Sitting balance-Leahy Scale: Fair       Standing balance-Leahy Scale: Poor                              Cognition Arousal/Alertness: Awake/alert Behavior During Therapy: WFL for tasks assessed/performed Overall Cognitive Status: Within Functional Limits for tasks assessed                                        Exercises Total Joint Exercises Ankle Circles/Pumps: AROM;Both;20 reps;Supine Quad Sets: AROM;Left;10 reps;Supine Heel Slides: AROM;Left;10 reps;Supine Hip ABduction/ADduction: AROM;Left;10 reps;Supine Straight Leg Raises: AROM;Left;10 reps;Supine;AAROM Goniometric ROM: 4-98 degrees in L knee.    General Comments        Pertinent Vitals/Pain Pain Assessment: 0-10 Pain Location: L knee Pain Descriptors / Indicators: Aching;Operative site guarding Pain Intervention(s): Monitored during session;Repositioned    Home Living                      Prior Function  PT Goals (current goals can now be found in the care plan section) Acute Rehab PT Goals Patient Stated Goal: to go to rehab before home Potential to Achieve Goals: Good Progress towards PT goals: Progressing toward goals    Frequency    7X/week      PT Plan Current plan remains appropriate    Co-evaluation              AM-PAC PT "6 Clicks" Mobility   Outcome Measure  Help needed turning from your back to your side while in a flat bed without using  bedrails?: None Help needed moving from lying on your back to sitting on the side of a flat bed without using bedrails?: None Help needed moving to and from a bed to a chair (including a wheelchair)?: A Little Help needed standing up from a chair using your arms (e.g., wheelchair or bedside chair)?: A Little Help needed to walk in hospital room?: A Little Help needed climbing 3-5 steps with a railing? : A Little 6 Click Score: 20    End of Session Equipment Utilized During Treatment: Gait belt Activity Tolerance: Patient tolerated treatment well Patient left: in chair;with call bell/phone within reach;with chair alarm set Nurse Communication: Mobility status;Patient requests pain meds PT Visit Diagnosis: Unsteadiness on feet (R26.81);Other abnormalities of gait and mobility (R26.89);Pain Pain - Right/Left: Left Pain - part of body: Knee     Time: 5732-2025 PT Time Calculation (min) (ACUTE ONLY): 32 min  Charges:  $Gait Training: 8-22 mins $Therapeutic Exercise: 8-22 mins                     Erasmo Leventhal , PTA Acute Rehabilitation Services Pager [phone] Office [phone]     [name] [name] 04/20/2020, 11:44 AM

## 2020-04-20 NOTE — Progress Notes (Signed)
   Subjective:  Patient reports pain as mild.    Objective:   VITALS:   Vitals:   04/19/20 1029 04/19/20 1303 04/19/20 1938 04/20/20 0605  BP:  (!) 135/95 123/64 (!) 128/58  Pulse:  93 (!) 103 82  Resp:  17 16 16   Temp:  98.4 F (36.9 C) 98.3 F (36.8 C) (!) 97.5 F (36.4 C)  TempSrc:  Oral Oral Oral  SpO2: 96% 95% 94% 100%  Weight:      Height:        Neurovascular intact Sensation intact distally Intact pulses distally Dorsiflexion/Plantar flexion intact Incision: dressing C/D/I and no drainage   Lab Results  Component Value Date   WBC 9.0 04/19/2020   HGB 10.6 (L) 04/19/2020   HCT 32.4 (L) 04/19/2020   MCV 100.9 (H) 04/19/2020   PLT 222 04/19/2020     Assessment/Plan:  2 Days Post-Op   - no events - progressing with PT - dc to [facility] pending insurance - thigh high TED to LLE  Eduard Roux 04/20/2020, 8:04 AM [phone]

## 2020-04-21 ENCOUNTER — Telehealth: Payer: Self-pay | Admitting: *Deleted

## 2020-04-21 DIAGNOSIS — M1712 Unilateral primary osteoarthritis, left knee: Secondary | ICD-10-CM | POA: Diagnosis not present

## 2020-04-21 DIAGNOSIS — I10 Essential (primary) hypertension: Secondary | ICD-10-CM | POA: Diagnosis not present

## 2020-04-21 DIAGNOSIS — M2550 Pain in unspecified joint: Secondary | ICD-10-CM | POA: Diagnosis not present

## 2020-04-21 DIAGNOSIS — K219 Gastro-esophageal reflux disease without esophagitis: Secondary | ICD-10-CM | POA: Diagnosis not present

## 2020-04-21 DIAGNOSIS — E039 Hypothyroidism, unspecified: Secondary | ICD-10-CM | POA: Diagnosis not present

## 2020-04-21 DIAGNOSIS — D649 Anemia, unspecified: Secondary | ICD-10-CM | POA: Diagnosis not present

## 2020-04-21 DIAGNOSIS — Z20822 Contact with and (suspected) exposure to covid-19: Secondary | ICD-10-CM | POA: Diagnosis not present

## 2020-04-21 DIAGNOSIS — M797 Fibromyalgia: Secondary | ICD-10-CM | POA: Diagnosis not present

## 2020-04-21 DIAGNOSIS — R5381 Other malaise: Secondary | ICD-10-CM | POA: Diagnosis not present

## 2020-04-21 DIAGNOSIS — F319 Bipolar disorder, unspecified: Secondary | ICD-10-CM | POA: Diagnosis not present

## 2020-04-21 DIAGNOSIS — Z7401 Bed confinement status: Secondary | ICD-10-CM | POA: Diagnosis not present

## 2020-04-21 DIAGNOSIS — F419 Anxiety disorder, unspecified: Secondary | ICD-10-CM | POA: Diagnosis not present

## 2020-04-21 MED ORDER — BISACODYL 10 MG RE SUPP
10.0000 mg | Freq: Once | RECTAL | Status: AC
  Administered 2020-04-21: 10 mg via RECTAL
  Filled 2020-04-21: qty 1

## 2020-04-21 NOTE — Progress Notes (Signed)
Physical Therapy Treatment Patient Details Name: [name] [name] MRN: [id number] DOB: [birth year] Today's Date: 04/21/2020    History of Present Illness Pt is a 79 y/o female s/p L TKA. PMH includes bipolar disorder, depression, fibromyalgia, HTN, CAD s/p heart cath, R THA, and lumbar fusion.     PT Comments    Pt seated on commode on arrival.  Pt progressed to standing and performed increased gt distance.  Plan for transfer to snf this pm.  Pt is focused on need to have BM before d/c.     Follow Up Recommendations  Follow surgeon's recommendation for DC plan and follow-up therapies;Supervision for mobility/OOB     Equipment Recommendations  None recommended by PT    Recommendations for Other Services       Precautions / Restrictions Precautions Precautions: Knee Precaution Booklet Issued: No Precaution Comments: Verbally reviewed knee precautions.  Restrictions LLE Weight Bearing: Weight bearing as tolerated    Mobility  Bed Mobility Overal bed mobility: Needs Assistance Bed Mobility: Sit to Supine       Sit to supine: Supervision   General bed mobility comments: Able to return back to bed with surpervision for safety.  Cues to boost to head of bed for optimal positioning.  Transfers Overall transfer level: Needs assistance Equipment used: Rolling walker (2 wheeled) (youth RW) Transfers: Sit to/from Stand Sit to Stand: Supervision         General transfer comment: Cues for sequencing and hand placement for safety.  Ambulation/Gait Ambulation/Gait assistance: Supervision Gait Distance (Feet): 250 Feet Assistive device: Rolling walker (2 wheeled) Gait Pattern/deviations: Decreased step length - right;Decreased stance time - left;Decreased weight shift to left;Antalgic;Step-through pattern;Trunk flexed Gait velocity: Decreased   General Gait Details: Cues for continued upper trunk control and forward gaze.   Stairs             Wheelchair Mobility     Modified Rankin (Stroke Patients Only)       Balance Overall balance assessment: Needs assistance Sitting-balance support: No upper extremity supported;Feet supported Sitting balance-Leahy Scale: Fair       Standing balance-Leahy Scale: Poor                              Cognition Arousal/Alertness: Awake/alert Behavior During Therapy: WFL for tasks assessed/performed Overall Cognitive Status: Within Functional Limits for tasks assessed                                        Exercises Total Joint Exercises Ankle Circles/Pumps: AROM;Both;20 reps;Supine Quad Sets: AROM;Left;10 reps;Supine Heel Slides: AROM;Left;10 reps;Supine Hip ABduction/ADduction: AROM;Left;10 reps;Supine Straight Leg Raises: AROM;Left;10 reps;Supine Goniometric ROM: 3-104 degress L knee.    General Comments        Pertinent Vitals/Pain Pain Assessment: 0-10 Pain Location: L knee Pain Descriptors / Indicators: Aching;Operative site guarding Pain Intervention(s): Monitored during session;Repositioned    Home Living                      Prior Function            PT Goals (current goals can now be found in the care plan section) Acute Rehab PT Goals Patient Stated Goal: to go to rehab before home Potential to Achieve Goals: Good Progress towards PT goals: Progressing toward goals    Frequency  7X/week      PT Plan Current plan remains appropriate    Co-evaluation              AM-PAC PT "6 Clicks" Mobility   Outcome Measure  Help needed turning from your back to your side while in a flat bed without using bedrails?: None Help needed moving from lying on your back to sitting on the side of a flat bed without using bedrails?: None Help needed moving to and from a bed to a chair (including a wheelchair)?: A Little Help needed standing up from a chair using your arms (e.g., wheelchair or bedside chair)?: A Little Help needed to walk in  hospital room?: A Little Help needed climbing 3-5 steps with a railing? : A Little 6 Click Score: 20    End of Session Equipment Utilized During Treatment: Gait belt Activity Tolerance: Patient tolerated treatment well Patient left: in chair;with call bell/phone within reach;with chair alarm set Nurse Communication: Mobility status;Patient requests pain meds PT Visit Diagnosis: Unsteadiness on feet (R26.81);Other abnormalities of gait and mobility (R26.89);Pain Pain - Right/Left: Left Pain - part of body: Knee     Time: 3383-2919 PT Time Calculation (min) (ACUTE ONLY): 22 min  Charges:  $Gait Training: 8-22 mins                     Erasmo Leventhal , PTA Acute Rehabilitation Services Pager [phone] Office [phone]    Lanore Renderos Eli Hose 04/21/2020, 2:28 PM

## 2020-04-21 NOTE — Plan of Care (Signed)
  Problem: Education: Goal: Knowledge of General Education information will improve Description: Including pain rating scale, medication(s)/side effects and non-pharmacologic comfort measures Outcome: Adequate for Discharge   Problem: Health Behavior/Discharge Planning: Goal: Ability to manage health-related needs will improve Outcome: Adequate for Discharge   Problem: Activity: Goal: Risk for activity intolerance will decrease Outcome: Adequate for Discharge   Problem: Nutrition: Goal: Adequate nutrition will be maintained Outcome: Adequate for Discharge   Problem: Coping: Goal: Level of anxiety will decrease Outcome: Adequate for Discharge   Problem: Elimination: Goal: Will not experience complications related to bowel motility Outcome: Adequate for Discharge   Problem: Pain Managment: Goal: General experience of comfort will improve Outcome: Adequate for Discharge   Problem: Skin Integrity: Goal: Risk for impaired skin integrity will decrease Outcome: Adequate for Discharge

## 2020-04-21 NOTE — TOC Transition Note (Addendum)
Transition of Care [facility]) - CM/SW Discharge Note   Patient Details  Name: ARLANDA SHIPLETT MRN: [id number] Date of Birth: 1941-03-06  Transition of Care [facility]) CM/SW Contact:  Sharin Mons, RN Phone Number: 04/21/2020, 9:28 AM   Clinical Narrative:     Patient will DC to: [facility] Anticipated DC date: 04/21/2020 Family notified: Mickel Baas ( daughter) Transport by: [employer] authorization given to [employer], [place]. PASRR pending, however, per Sula Rumple will waive and received pt today.  Per MD patient ready for DC today . RN, patient, patient's family, and facility notified of DC. Discharge Summary and FL2 sent to facility. RN to call report prior to discharge [phone]). Rm # [phone].  DC packet on chart. Ambulance transport requested @ 11:25 am.  RNCM will sign off for now as intervention is no longer needed. Please consult [place] again if new needs arise.   Final next level of care: [facility]) Barriers to Discharge: No Barriers Identified   Patient Goals and CMS Choice        Discharge Placement                       Discharge Plan and Services                DME Arranged: Continuous passive motion machine DME Agency: [place]                  Social Determinants of Health (SDOH) Interventions     Readmission Risk Interventions No flowsheet data found.

## 2020-04-21 NOTE — Progress Notes (Signed)
Report given to Brenda,LPN of [facility]

## 2020-04-21 NOTE — Progress Notes (Signed)
Discharge summary packet/necessary documents provided to [facility] staff. Pt alert/oriented in no apparent distress. No complaints.

## 2020-04-21 NOTE — TOC Progression Note (Signed)
Transition of Care [facility]) - Progression Note    Patient Details  Name: [name] [name] MRN: [id number] Date of Birth: [birth year]  Transition of Care [facility]) CM/SW Contact  Sharin Mons, RN Phone Number: [phone] 04/21/2020, 9:03 AM  Clinical Narrative:     Awaiting PASRR .Marland Kitchen... SNF insurance authorization approved for 5 days , 6/10- 6/14. [employer] reference  [id number], [place] ID# [id number] Fredric Mare CM,. Fax# [phone].  TOC team will continue to monitor.  Expected Discharge Plan: Skilled Nursing Facility Barriers to Discharge: [place] [facility])  Expected Discharge Plan and Services Expected Discharge Plan: [place]         Expected Discharge Date: 04/19/20               DME Arranged: Continuous passive motion machine DME Agency: [place]                   Social Determinants of Health (SDOH) Interventions    Readmission Risk Interventions No flowsheet data found.

## 2020-04-21 NOTE — Telephone Encounter (Signed)
Ortho bundle call to patient. D/C today to [facility] facility. Updated that she will receive her CPM at the facility through [place].

## 2020-04-22 ENCOUNTER — Telehealth: Payer: Self-pay | Admitting: *Deleted

## 2020-04-22 DIAGNOSIS — M1712 Unilateral primary osteoarthritis, left knee: Secondary | ICD-10-CM | POA: Diagnosis not present

## 2020-04-22 DIAGNOSIS — E039 Hypothyroidism, unspecified: Secondary | ICD-10-CM | POA: Diagnosis not present

## 2020-04-22 DIAGNOSIS — I10 Essential (primary) hypertension: Secondary | ICD-10-CM | POA: Diagnosis not present

## 2020-04-22 DIAGNOSIS — F319 Bipolar disorder, unspecified: Secondary | ICD-10-CM | POA: Diagnosis not present

## 2020-04-22 NOTE — Telephone Encounter (Signed)
Ortho bundle D/C call completed. 

## 2020-04-25 DIAGNOSIS — M1712 Unilateral primary osteoarthritis, left knee: Secondary | ICD-10-CM | POA: Diagnosis not present

## 2020-04-25 DIAGNOSIS — Z96652 Presence of left artificial knee joint: Secondary | ICD-10-CM | POA: Diagnosis not present

## 2020-04-26 ENCOUNTER — Telehealth: Payer: Self-pay | Admitting: *Deleted

## 2020-04-26 NOTE — Telephone Encounter (Signed)
Ortho bundle 7 day call completed. 

## 2020-04-26 NOTE — Care Plan (Signed)
RNCM received call from patient's daughter today with patient present during visit in background. She states that her mother discharged herself from [facility] over the weekend on Sunday and is currently staying at her home in the [place] area. She requested should HHPT be started or OPPT there in [place]. Discussed with Dr. Erlinda Hong and feel that since she only received really 1 therapy session while at [facility], then [facility] would be most appropriate. Referral made to [facility] (choice provided) as they have an office in the [facility] location that can accommodate start of care this week. F/U here in Dr. Phoebe Sharps office scheduled for 05/03/20. Daughter, patient, and CM all agreed that by the week of 05/09/20, she will most likely be able to return to her home and begin OPPT. Requested this be set up in [place] area. RNCM will assist with this. Daughter also had questions regarding the CPM that was provided for use at [facility] and they took with them at discharge. Contact to Clark with [employer] and requested he call the family to discuss all questions related to CPM. Daughter agreeable to plan.

## 2020-04-28 DIAGNOSIS — G8929 Other chronic pain: Secondary | ICD-10-CM | POA: Diagnosis not present

## 2020-04-28 DIAGNOSIS — K219 Gastro-esophageal reflux disease without esophagitis: Secondary | ICD-10-CM | POA: Diagnosis not present

## 2020-04-28 DIAGNOSIS — I2510 Atherosclerotic heart disease of native coronary artery without angina pectoris: Secondary | ICD-10-CM | POA: Diagnosis not present

## 2020-04-28 DIAGNOSIS — D649 Anemia, unspecified: Secondary | ICD-10-CM | POA: Diagnosis not present

## 2020-04-28 DIAGNOSIS — I10 Essential (primary) hypertension: Secondary | ICD-10-CM | POA: Diagnosis not present

## 2020-04-28 DIAGNOSIS — Z471 Aftercare following joint replacement surgery: Secondary | ICD-10-CM | POA: Diagnosis not present

## 2020-04-28 DIAGNOSIS — M549 Dorsalgia, unspecified: Secondary | ICD-10-CM | POA: Diagnosis not present

## 2020-04-28 DIAGNOSIS — E785 Hyperlipidemia, unspecified: Secondary | ICD-10-CM | POA: Diagnosis not present

## 2020-04-28 DIAGNOSIS — E039 Hypothyroidism, unspecified: Secondary | ICD-10-CM | POA: Diagnosis not present

## 2020-04-29 DIAGNOSIS — E039 Hypothyroidism, unspecified: Secondary | ICD-10-CM | POA: Diagnosis not present

## 2020-04-29 DIAGNOSIS — E785 Hyperlipidemia, unspecified: Secondary | ICD-10-CM | POA: Diagnosis not present

## 2020-04-29 DIAGNOSIS — I10 Essential (primary) hypertension: Secondary | ICD-10-CM | POA: Diagnosis not present

## 2020-04-29 DIAGNOSIS — K219 Gastro-esophageal reflux disease without esophagitis: Secondary | ICD-10-CM | POA: Diagnosis not present

## 2020-04-29 DIAGNOSIS — M549 Dorsalgia, unspecified: Secondary | ICD-10-CM | POA: Diagnosis not present

## 2020-04-29 DIAGNOSIS — I2510 Atherosclerotic heart disease of native coronary artery without angina pectoris: Secondary | ICD-10-CM | POA: Diagnosis not present

## 2020-04-29 DIAGNOSIS — G8929 Other chronic pain: Secondary | ICD-10-CM | POA: Diagnosis not present

## 2020-04-29 DIAGNOSIS — D649 Anemia, unspecified: Secondary | ICD-10-CM | POA: Diagnosis not present

## 2020-04-29 DIAGNOSIS — Z471 Aftercare following joint replacement surgery: Secondary | ICD-10-CM | POA: Diagnosis not present

## 2020-05-02 ENCOUNTER — Other Ambulatory Visit: Payer: Self-pay | Admitting: *Deleted

## 2020-05-02 DIAGNOSIS — M549 Dorsalgia, unspecified: Secondary | ICD-10-CM | POA: Diagnosis not present

## 2020-05-02 DIAGNOSIS — G8929 Other chronic pain: Secondary | ICD-10-CM | POA: Diagnosis not present

## 2020-05-02 DIAGNOSIS — K219 Gastro-esophageal reflux disease without esophagitis: Secondary | ICD-10-CM | POA: Diagnosis not present

## 2020-05-02 DIAGNOSIS — Z471 Aftercare following joint replacement surgery: Secondary | ICD-10-CM | POA: Diagnosis not present

## 2020-05-02 DIAGNOSIS — M1712 Unilateral primary osteoarthritis, left knee: Secondary | ICD-10-CM

## 2020-05-02 DIAGNOSIS — I2510 Atherosclerotic heart disease of native coronary artery without angina pectoris: Secondary | ICD-10-CM | POA: Diagnosis not present

## 2020-05-02 DIAGNOSIS — D649 Anemia, unspecified: Secondary | ICD-10-CM | POA: Diagnosis not present

## 2020-05-02 DIAGNOSIS — I10 Essential (primary) hypertension: Secondary | ICD-10-CM | POA: Diagnosis not present

## 2020-05-02 DIAGNOSIS — Z96652 Presence of left artificial knee joint: Secondary | ICD-10-CM

## 2020-05-02 DIAGNOSIS — E785 Hyperlipidemia, unspecified: Secondary | ICD-10-CM | POA: Diagnosis not present

## 2020-05-02 DIAGNOSIS — E039 Hypothyroidism, unspecified: Secondary | ICD-10-CM | POA: Diagnosis not present

## 2020-05-03 ENCOUNTER — Ambulatory Visit (INDEPENDENT_AMBULATORY_CARE_PROVIDER_SITE_OTHER): Payer: Medicare PPO | Admitting: Orthopaedic Surgery

## 2020-05-03 ENCOUNTER — Telehealth: Payer: Self-pay | Admitting: *Deleted

## 2020-05-03 DIAGNOSIS — Z96652 Presence of left artificial knee joint: Secondary | ICD-10-CM

## 2020-05-03 NOTE — Telephone Encounter (Signed)
Ortho bundle 14 day call completed. 

## 2020-05-03 NOTE — Progress Notes (Signed)
Post-Op Visit Note   Patient: [name] [name]           Date of Birth: 07-Apr-1941           MRN: [id number] Visit Date: 05/03/2020 PCP: Perlie Mayo, NP   Assessment & Plan:  Chief Complaint:  Chief Complaint  Patient presents with  . Left Knee - Routine Post Op   Visit Diagnoses:  1. Status post total left knee replacement     Plan: Shontae is 2 weeks status post left total knee replacement.  She is now living at home and doing very well.  Her daughter is with her today.  We remove the sutures.  She will continue with the thigh-high compression hose.  Outpatient physical therapy has been arranged for her.  She will continue with aspirin for DVT prophylaxis.  Wound care instructions were reviewed with the patient.  Recheck in 4 weeks with two-view x-rays of the left knee.  Follow-Up Instructions: Return in about 4 weeks (around 05/31/2020).   Orders:  No orders of the defined types were placed in this encounter.  No orders of the defined types were placed in this encounter.   Imaging: No results found.  PMFS History: Patient Active Problem List   Diagnosis Date Noted  . Status post total left knee replacement 04/18/2020  . Primary osteoarthritis of left knee 04/17/2020  . Scalp laceration 03/01/2020  . Abdominal pain, epigastric 08/14/2019  . Midsternal chest pain 08/19/2015  . Bipolar disorder ([place])   . Hypothyroidism 08/04/2015    Class: Diagnosis of  . Chronic cough 11/23/2014  . Fecal incontinence 11/23/2014  . Constipation 09/22/2013  . H/O adenomatous polyp of colon 09/22/2013  . Muscle weakness (generalized) 04/27/2013  . Chronic fatigue 03/05/2013  . Chronic pain syndrome 03/05/2013  . Vitamin D deficiency 03/05/2013  . Insomnia 03/05/2013  . Hot flashes 03/05/2013  . History of gastroesophageal reflux (GERD) 03/05/2013  . History of back surgery 03/05/2013  . H/O anxiety disorder 03/05/2013  . HTN (hypertension) 10/15/2011  . Difficulty in  walking(719.7) 07/26/2011  . Hyperlipidemia 05/11/2009  . BIPOLAR AFFECTIVE DISORDER 05/11/2009  . Coronary artery disease 05/11/2009  . GERD 05/11/2009  . Fibromyalgia 05/11/2009  . DIZZINESS 05/11/2009  . Chest pain 05/11/2009   Past Medical History:  Diagnosis Date  . Anemia   . Anxiety   . Arthritis   . Bipolar affective disorder ([place])   . Cataract   . Chronic back pain   . Chronic fatigue   . Complication of anesthesia   . Constipation   . Depression   . Dizziness   . Dyslipidemia   . Fibromyalgia   . GERD (gastroesophageal reflux disease)   . H/O: hysterectomy   . History of kidney stones   . Hx of adenomatous colonic polyps 2005  . Hyperlipidemia   . Hypertension   . Hypothyroidism   . Knee pain   . Non-obstructive CAD    a. 08/2015 Cath: LM 40, LCX small, 30ost, 34m, OM2 small, RCA nl/small, EF 55-65%.  . Osteopenia   . PONV (postoperative nausea and vomiting)   . Sciatica     Family History  Problem Relation Age of Onset  . Heart disease Mother   . Heart attack Mother   . Heart disease Father   . Heart attack Father   . Stroke Sister   . Stroke Maternal Grandfather   . Cancer Paternal Grandmother        unknown primary  .  Colon cancer Neg Hx     Past Surgical History:  Procedure Laterality Date  . ABDOMINAL HYSTERECTOMY  1994   TAH- DUB, BSO  . [place] bunionectomy     bilateral  . BREAST BIOPSY Left 1989  . BREAST LUMPECTOMY    . BUNIONECTOMY Left 2005  . CARDIAC CATHETERIZATION  2010   +CAD  . CARDIAC CATHETERIZATION N/A 08/19/2015   Procedure: Left Heart Cath and Coronary Angiography;  Surgeon: Belva Crome, MD;  Location: [facility];  Service: Cardiovascular;  Laterality: N/A;  . 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  Remainder of colonic mucosa was normal  . COLONOSCOPY WITH ESOPHAGOGASTRODUODENOSCOPY (EGD) N/A 02/01/2014   ZMO:QHUT erosive  reflux/gastric polyp/normal rectum/scattered left sided diverticula, normal distal TI. gastric bx negative, fundic gland polyp. next TCS 01/2019.  Marland Kitchen CYSTECTOMY     pilonidial cyst  . ESOPHAGOGASTRODUODENOSCOPY     followed by colonoscopy with snare polypectomy  . EYE SURGERY Bilateral    cataract surgery with lens implants  . REVISION TOTAL HIP ARTHROPLASTY Right   . right knee replacement  02/2013  . SHOULDER ARTHROSCOPY Left 2005  . SHOULDER ARTHROSCOPY Right   . SPINAL FUSION    . SPINAL FUSION  2004   L4-L5  . TOTAL HIP ARTHROPLASTY Right 06/2006  . TOTAL HIP ARTHROPLASTY  8/12  . TOTAL KNEE ARTHROPLASTY Left 04/18/2020  . TOTAL KNEE ARTHROPLASTY Left 04/18/2020   Procedure: LEFT TOTAL KNEE ARTHROPLASTY;  Surgeon: Leandrew Koyanagi, MD;  Location: [facility];  Service: Orthopedics;  Laterality: Left;  . TOTAL SHOULDER REPLACEMENT Left 08/2005   Social History   Occupational History  . 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 Smoking status: Never Smoker  . Smokeless tobacco: Never Used  Vaping Use  . Vaping Use: Never used  Substance and Sexual Activity  . Alcohol use: Yes    Alcohol/week: 7.0 standard drinks    Types: 7 Glasses of wine per week    Comment: almost daily  . Drug use: No  . Sexual activity: Not Currently    Partners: Male    Birth control/protection: Surgical    Comment: TAH

## 2020-05-03 NOTE — Care Plan (Signed)
Patient seen in office during her 2 week post-op with Dr. Erlinda Hong. She has full extension on her left knee and states with HHPT, she is at 114 flexion. Overall she is doing very well. Stitches removed today and discussed with Dr. Erlinda Hong compression hose use, dental prophylaxis and all questions answered. She is scheduled to begin OPPT on 05/11/20 at [facility] [place]. She is aware. She will be coming home to her house in [place] and leaving her daughter's home in [place], where she has been staying over the next week or so. HHPT will be completed by this time.

## 2020-05-04 DIAGNOSIS — I2510 Atherosclerotic heart disease of native coronary artery without angina pectoris: Secondary | ICD-10-CM | POA: Diagnosis not present

## 2020-05-04 DIAGNOSIS — G8929 Other chronic pain: Secondary | ICD-10-CM | POA: Diagnosis not present

## 2020-05-04 DIAGNOSIS — K219 Gastro-esophageal reflux disease without esophagitis: Secondary | ICD-10-CM | POA: Diagnosis not present

## 2020-05-04 DIAGNOSIS — D649 Anemia, unspecified: Secondary | ICD-10-CM | POA: Diagnosis not present

## 2020-05-04 DIAGNOSIS — E039 Hypothyroidism, unspecified: Secondary | ICD-10-CM | POA: Diagnosis not present

## 2020-05-04 DIAGNOSIS — Z471 Aftercare following joint replacement surgery: Secondary | ICD-10-CM | POA: Diagnosis not present

## 2020-05-04 DIAGNOSIS — M549 Dorsalgia, unspecified: Secondary | ICD-10-CM | POA: Diagnosis not present

## 2020-05-04 DIAGNOSIS — I10 Essential (primary) hypertension: Secondary | ICD-10-CM | POA: Diagnosis not present

## 2020-05-04 DIAGNOSIS — E785 Hyperlipidemia, unspecified: Secondary | ICD-10-CM | POA: Diagnosis not present

## 2020-05-05 ENCOUNTER — Ambulatory Visit: Payer: Medicare PPO | Admitting: Family Medicine

## 2020-05-06 DIAGNOSIS — I10 Essential (primary) hypertension: Secondary | ICD-10-CM | POA: Diagnosis not present

## 2020-05-06 DIAGNOSIS — E785 Hyperlipidemia, unspecified: Secondary | ICD-10-CM | POA: Diagnosis not present

## 2020-05-06 DIAGNOSIS — K219 Gastro-esophageal reflux disease without esophagitis: Secondary | ICD-10-CM | POA: Diagnosis not present

## 2020-05-06 DIAGNOSIS — G8929 Other chronic pain: Secondary | ICD-10-CM | POA: Diagnosis not present

## 2020-05-06 DIAGNOSIS — I2510 Atherosclerotic heart disease of native coronary artery without angina pectoris: Secondary | ICD-10-CM | POA: Diagnosis not present

## 2020-05-06 DIAGNOSIS — M549 Dorsalgia, unspecified: Secondary | ICD-10-CM | POA: Diagnosis not present

## 2020-05-06 DIAGNOSIS — E039 Hypothyroidism, unspecified: Secondary | ICD-10-CM | POA: Diagnosis not present

## 2020-05-06 DIAGNOSIS — D649 Anemia, unspecified: Secondary | ICD-10-CM | POA: Diagnosis not present

## 2020-05-06 DIAGNOSIS — Z471 Aftercare following joint replacement surgery: Secondary | ICD-10-CM | POA: Diagnosis not present

## 2020-05-11 ENCOUNTER — Encounter (HOSPITAL_COMMUNITY): Payer: Self-pay | Admitting: Physical Therapy

## 2020-05-11 ENCOUNTER — Ambulatory Visit (HOSPITAL_COMMUNITY): Payer: Medicare PPO | Attending: Orthopaedic Surgery | Admitting: Physical Therapy

## 2020-05-11 ENCOUNTER — Other Ambulatory Visit: Payer: Self-pay

## 2020-05-11 DIAGNOSIS — M25662 Stiffness of left knee, not elsewhere classified: Secondary | ICD-10-CM | POA: Diagnosis not present

## 2020-05-11 DIAGNOSIS — R2689 Other abnormalities of gait and mobility: Secondary | ICD-10-CM | POA: Diagnosis not present

## 2020-05-11 DIAGNOSIS — M25562 Pain in left knee: Secondary | ICD-10-CM | POA: Diagnosis not present

## 2020-05-11 NOTE — Therapy (Signed)
[place], [place], [address] Phone: [phone]   Fax:  [phone]  Physical Therapy Evaluation  Patient Details  Name: [name] [name] MRN: [id number] Date of Birth: 03/11/1941 Referring Provider (PT): Smitty Knudsen MD    Encounter Date: 05/11/2020   PT End of Session - 05/11/20 0957    Visit Number 1    Number of Visits 18    Date for PT Re-Evaluation 06/24/20    Authorization Type [employer]    Progress Note Due on Visit 10    PT Start Time [phone]    Equipment Utilized During Treatment Gait belt    Activity Tolerance Patient tolerated treatment well    Behavior During Therapy [facility] for tasks assessed/performed           Past Medical History:  Diagnosis Date  . Anemia   . Anxiety   . Arthritis   . Bipolar affective disorder ([place])   . Cataract   . Chronic back pain   . Chronic fatigue   . Complication of anesthesia   . Constipation   . Depression   . Dizziness   . Dyslipidemia   . Fibromyalgia   . GERD (gastroesophageal reflux disease)   . H/O: hysterectomy   . History of kidney stones   . Hx of adenomatous colonic polyps 2005  . Hyperlipidemia   . Hypertension   . Hypothyroidism   . Knee pain   . Non-obstructive CAD    a. 08/2015 Cath: LM 40, LCX small, 30ost, 51m, OM2 small, RCA nl/small, EF 55-65%.  . Osteopenia   . PONV (postoperative nausea and vomiting)   . Sciatica     Past Surgical History:  Procedure Laterality Date  . ABDOMINAL HYSTERECTOMY  1994   TAH- DUB, BSO  . [place] bunionectomy     bilateral  . BREAST BIOPSY Left 1989  . BREAST LUMPECTOMY    . BUNIONECTOMY Left 2005  . CARDIAC CATHETERIZATION  2010   +CAD  . CARDIAC CATHETERIZATION N/A 08/19/2015   Procedure: Left Heart Cath and Coronary Angiography;  Surgeon: Belva Crome, MD;  Location: [place] [facility];  Service: Cardiovascular;  Laterality: N/A;  . COLONOSCOPY  01/29/2007   QTM:AUQJFH rectum, left-sided  diverticula, 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  Remainder of colonic mucosa was normal  . COLONOSCOPY WITH ESOPHAGOGASTRODUODENOSCOPY (EGD) N/A 02/01/2014   LKT:GYBW erosive reflux/gastric polyp/normal rectum/scattered left sided diverticula, normal distal TI. gastric bx negative, fundic gland polyp. next TCS 01/2019.  Marland Kitchen CYSTECTOMY     pilonidial cyst  . ESOPHAGOGASTRODUODENOSCOPY     followed by colonoscopy with snare polypectomy  . EYE SURGERY Bilateral    cataract surgery with lens implants  . REVISION TOTAL HIP ARTHROPLASTY Right   . right knee replacement  02/2013  . SHOULDER ARTHROSCOPY Left 2005  . SHOULDER ARTHROSCOPY Right   . SPINAL FUSION    . SPINAL FUSION  2004   L4-L5  . TOTAL HIP ARTHROPLASTY Right 06/2006  . TOTAL HIP ARTHROPLASTY  8/12  . TOTAL KNEE ARTHROPLASTY Left 04/18/2020  . TOTAL KNEE ARTHROPLASTY Left 04/18/2020   Procedure: LEFT TOTAL KNEE ARTHROPLASTY;  Surgeon: Leandrew Koyanagi, MD;  Location: [place];  Service: Orthopedics;  Laterality: Left;  . TOTAL SHOULDER REPLACEMENT Left 08/2005    There were no vitals filed for this visit.    Subjective Assessment - 05/11/20 0954    Subjective Patient presents to physical therapy with complaint  of LT knee pain s/p LT TKA 04/18/20. Patient says pain has been moderate. Says she has been using CPM at home which has helped with ROM. Patient says she had home health therapy come and work with her while staying at her daughter's house. Says she is managing pain with pain medicine, but only about 1 pill per day. Was walking with walker at first, now is walking with cane. Has been doing well with this.    Limitations Standing;Walking;House hold activities    How long can you stand comfortably? 5 minutes    How long can you walk comfortably? 5 minutes    Patient Stated Goals Be able to walk, be more balanced    Currently in Pain? Yes    Pain Score 5     Pain Location Knee    Pain Orientation Left;Anterior     Pain Descriptors / Indicators Aching;Dull    Pain Type Surgical pain    Pain Onset 1 to 4 weeks ago    Pain Frequency Constant    Aggravating Factors  standing, bending, walking, stairs    Pain Relieving Factors rest, meds    Effect of Pain on Daily Activities limits              [facility] PT Assessment - 05/11/20 0001      Assessment   Medical Diagnosis Lt TKA     Referring Provider (PT) Smitty Knudsen MD     Onset Date/Surgical Date 04/18/20    Next MD Visit 06/07/20    Prior Therapy Had [facility] for 2 weeks       Precautions   Precautions None      Restrictions   Weight Bearing Restrictions No      Balance Screen   Has the patient fallen in the past 6 months No    Has the patient had a decrease in activity level because of a fear of falling?  No    Is the patient reluctant to leave their home because of a fear of falling?  No      Home Environment   Living Environment Private residence    Living Arrangements Alone    Available Help at Discharge Family    Type of [place] to enter    Entrance Stairs-Number of Steps 6    Entrance Stairs-Rails Right;Left    Home Layout Two level      Prior Function   Level of Independence Independent with basic ADLs      Cognition   Overall Cognitive Status Within Functional Limits for tasks assessed      Sensation   Light Touch Appears Intact      ROM / Strength   AROM / PROM / Strength AROM;Strength      AROM   AROM Assessment Site Knee    Right/Left Knee Right;Left    Right Knee Extension 0    Right Knee Flexion 132    Left Knee Extension 3    Left Knee Flexion 115      Strength   Strength Assessment Site Hip;Knee;Ankle    Right/Left Hip Right;Left    Right Hip Flexion 4+/5    Left Hip Flexion 4/5    Right/Left Knee Right;Left    Right Knee Flexion 4+/5    Right Knee Extension 5/5    Left Knee Flexion 4+/5    Left Knee Extension 4/5    Right/Left Ankle Right;Left    Right Ankle Dorsiflexion  5/5     Left Ankle Dorsiflexion 4+/5      Palpation   Palpation comment Mod tenderness to palpation diffuse about LT knee joint       Transfers   Five time sit to stand comments  11.77 sec with no UEs       Ambulation/Gait   Ambulation/Gait Yes    Ambulation/Gait Assistance 6: Modified independent (Device/Increase time)    Ambulation Distance (Feet) 280 Feet    Assistive device Straight cane    Gait Pattern Decreased stance time - left;Decreased step length - right;Decreased step length - left;Decreased stride length;Trendelenburg;Antalgic    Ambulation Surface Level;Indoor      Balance   Balance Assessed Yes      Static Standing Balance   Static Standing Balance -  Activities  Tandam Stance - Right Leg;Tandam Stance - Left Leg    Static Standing - Comment/# of Minutes 20 sec mod sway, 30 sec min sway                       Objective measurements completed on examination: See above findings.       [facility] Adult PT Treatment/Exercise - 05/11/20 0001      Exercises   Exercises Knee/Hip      Knee/Hip Exercises: Supine   Quad Sets Left;5 reps    Heel Slides Left;5 reps    Straight Leg Raises Left;5 reps                  PT Education - 05/11/20 0956    Education Details on evaluation findings, POC and HEP    Person(s) Educated Patient    Methods Explanation;Handout    Comprehension Verbalized understanding            PT Short Term Goals - 05/11/20 1028      PT SHORT TERM GOAL #1   Title Patient will be independent with initial HEP and self-management strategies to improve functional outcomes    Time 3    Period Weeks    Status New    Target Date 06/03/20      PT SHORT TERM GOAL #2   Title Patient will report at least 25% overall improvement in subjective complaint to indicate improvement in ability to perform ADLs.    Time 3    Period Weeks    Status New    Target Date 06/03/20             PT Long Term Goals - 05/11/20 1029      PT LONG  TERM GOAL #1   Title Patient will be able to ambulate at least 325 feet during 2MWT with LRAD to demonstrate improved ability to perform functional mobility and associated tasks.    Time 6    Period Weeks    Status New    Target Date 06/24/20      PT LONG TERM GOAL #2   Title Patient will report at least 75% overall improvement in subjective complaint to indicate improvement in ability to perform ADLs.    Time 6    Period Weeks    Status New    Target Date 06/24/20      PT LONG TERM GOAL #3   Title Patient will improve FOTO score by 10 % to indicate improvement in functional outcomes    Time 6    Period Weeks    Status New    Target Date 06/24/20  PT LONG TERM GOAL #4   Title Patient will have LT knee AROM 0-120 degrees to improve functional mobility and facilitate squatting to pick up items from floor.    Time 6    Period Weeks    Status New    Target Date 06/24/20                  Plan - 05/11/20 1021    Clinical Impression Statement Patient is a 79 y.o. female who presents to physical therapy with complaint of LT knee pain s/p LT TKA on 04/18/20. Patient demonstrates decreased strength, ROM restriction, balance deficits and gait abnormalities which are likely contributing to symptoms of pain and are negatively impacting patient ability to perform ADLs and functional mobility tasks. Patient will benefit from skilled physical therapy services to address these deficits to reduce pain, improve level of function with ADLs, functional mobility tasks, and reduce risk for falls.    Personal Factors and Comorbidities Age;Comorbidity 3+    Comorbidities fibromyalgia, HTN, arthritis    Examination-Activity Limitations Locomotion Level;Transfers;Stand;Stairs;Squat;Bend    Examination-Participation Restrictions Laundry;Community Activity;Cleaning    Stability/Clinical Decision Making Stable/Uncomplicated    Clinical Decision Making Low    Rehab Potential Good    PT Frequency 3x  / week    PT Duration 6 weeks    PT Treatment/Interventions ADLs/Self Care Home Management;Aquatic Therapy;Biofeedback;Cryotherapy;Electrical Stimulation;Iontophoresis 4mg /ml Dexamethasone;Moist Heat;Traction;Balance training;Manual lymph drainage;Manual techniques;Therapeutic exercise;Therapeutic activities;Functional mobility training;Splinting;Taping;Vasopneumatic Device;Vestibular;Energy conservation;Dry needling;Gait training;Orthotic Fit/Training;Stair training;DME Instruction;Patient/family education;Cognitive remediation;Contrast Bath;Scar mobilization;Passive range of motion;Spinal Manipulations;Joint Manipulations;Compression bandaging;Neuromuscular re-education;Ultrasound;Parrafin;Fluidtherapy    PT Next Visit Plan Complete FOTO, review goals and HEP. Progress knee strength and ROM as able. Progress to standing strength and balance when ready.    PT Home Exercise Plan 05/11/20: quad set, heel slide, SLR    Consulted and Agree with Plan of Care Patient           Patient will benefit from skilled therapeutic intervention in order to improve the following deficits and impairments:  Abnormal gait, Decreased endurance, Hypomobility, Increased edema, Decreased scar mobility, Decreased activity tolerance, Decreased balance, Decreased mobility, Difficulty walking, Decreased strength, Pain, Impaired flexibility, Decreased range of motion  Visit Diagnosis: Left knee pain, unspecified chronicity  Stiffness of left knee, not elsewhere classified  Other abnormalities of gait and mobility     Problem List Patient Active Problem List   Diagnosis Date Noted  . Status post total left knee replacement 04/18/2020  . Primary osteoarthritis of left knee 04/17/2020  . Scalp laceration 03/01/2020  . Abdominal pain, epigastric 08/14/2019  . Midsternal chest pain 08/19/2015  . Bipolar disorder ([place])   . Hypothyroidism 08/04/2015    Class: Diagnosis of  . Chronic cough 11/23/2014  . Fecal  incontinence 11/23/2014  . Constipation 09/22/2013  . H/O adenomatous polyp of colon 09/22/2013  . Muscle weakness (generalized) 04/27/2013  . Chronic fatigue 03/05/2013  . Chronic pain syndrome 03/05/2013  . Vitamin D deficiency 03/05/2013  . Insomnia 03/05/2013  . Hot flashes 03/05/2013  . History of gastroesophageal reflux (GERD) 03/05/2013  . History of back surgery 03/05/2013  . H/O anxiety disorder 03/05/2013  . HTN (hypertension) 10/15/2011  . Difficulty in walking(719.7) 07/26/2011  . Hyperlipidemia 05/11/2009  . BIPOLAR AFFECTIVE DISORDER 05/11/2009  . Coronary artery disease 05/11/2009  . GERD 05/11/2009  . Fibromyalgia 05/11/2009  . DIZZINESS 05/11/2009  . Chest pain 05/11/2009    10:33 AM, 05/11/20 Josue Hector PT DPT  Physical Therapist with [facility]  [facility]  [phone]   [facility] [place], [place], [address] Phone: [phone]   Fax:  [phone]  Name: KEVIONNA HEFFLER MRN: [id number] Date of Birth: 04/05/1941

## 2020-05-12 ENCOUNTER — Encounter (HOSPITAL_COMMUNITY): Payer: Medicare PPO | Admitting: Physical Therapy

## 2020-05-12 ENCOUNTER — Telehealth (HOSPITAL_COMMUNITY): Payer: Self-pay | Admitting: Physical Therapy

## 2020-05-12 NOTE — Telephone Encounter (Signed)
pt cancelled appt for today because she is not feeling well 

## 2020-05-17 ENCOUNTER — Other Ambulatory Visit: Payer: Self-pay

## 2020-05-17 ENCOUNTER — Ambulatory Visit (HOSPITAL_COMMUNITY): Payer: Medicare PPO | Attending: Orthopaedic Surgery | Admitting: Physical Therapy

## 2020-05-17 ENCOUNTER — Encounter (HOSPITAL_COMMUNITY): Payer: Self-pay | Admitting: Physical Therapy

## 2020-05-17 DIAGNOSIS — M25662 Stiffness of left knee, not elsewhere classified: Secondary | ICD-10-CM | POA: Insufficient documentation

## 2020-05-17 DIAGNOSIS — M25562 Pain in left knee: Secondary | ICD-10-CM | POA: Diagnosis not present

## 2020-05-17 DIAGNOSIS — R2689 Other abnormalities of gait and mobility: Secondary | ICD-10-CM

## 2020-05-17 NOTE — Therapy (Signed)
[place], [place], [address] Phone: [phone]   Fax:  [phone]  Physical Therapy Treatment  Patient Details  Name: [name] [name] MRN: [id number] Date of Birth: 02-10-1941 Referring Provider (PT): Smitty Knudsen MD    Encounter Date: 05/17/2020   PT End of Session - 05/17/20 1526    Visit Number 2    Number of Visits 18    Date for PT Re-Evaluation 06/24/20    Authorization Type [employer]    Authorization Time Period 05/11/20-06/24/20    Authorization - Visit Number 1    Authorization - Number of Visits 12    Progress Note Due on Visit 10    PT Start Time 1520    PT Stop Time 1600    PT Time Calculation (min) 40 min    Equipment Utilized During Treatment Gait belt    Activity Tolerance Patient tolerated treatment well    Behavior During Therapy [facility] for tasks assessed/performed           Past Medical History:  Diagnosis Date  . Anemia   . Anxiety   . Arthritis   . Bipolar affective disorder ([place])   . Cataract   . Chronic back pain   . Chronic fatigue   . Complication of anesthesia   . Constipation   . Depression   . Dizziness   . Dyslipidemia   . Fibromyalgia   . GERD (gastroesophageal reflux disease)   . H/O: hysterectomy   . History of kidney stones   . Hx of adenomatous colonic polyps 2005  . Hyperlipidemia   . Hypertension   . Hypothyroidism   . Knee pain   . Non-obstructive CAD    a. 08/2015 Cath: LM 40, LCX small, 30ost, 16m, OM2 small, RCA nl/small, EF 55-65%.  . Osteopenia   . PONV (postoperative nausea and vomiting)   . Sciatica     Past Surgical History:  Procedure Laterality Date  . ABDOMINAL HYSTERECTOMY  1994   TAH- DUB, BSO  . [place] bunionectomy     bilateral  . BREAST BIOPSY Left 1989  . BREAST LUMPECTOMY    . BUNIONECTOMY Left 2005  . CARDIAC CATHETERIZATION  2010   +CAD  . CARDIAC CATHETERIZATION N/A 08/19/2015   Procedure: Left Heart Cath and Coronary  Angiography;  Surgeon: Belva Crome, MD;  Location: [place] [facility];  Service: Cardiovascular;  Laterality: N/A;  . 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  Remainder of colonic mucosa was normal  . COLONOSCOPY WITH ESOPHAGOGASTRODUODENOSCOPY (EGD) N/A 02/01/2014   PNT:IRWE erosive reflux/gastric polyp/normal rectum/scattered left sided diverticula, normal distal TI. gastric bx negative, fundic gland polyp. next TCS 01/2019.  Marland Kitchen CYSTECTOMY     pilonidial cyst  . ESOPHAGOGASTRODUODENOSCOPY     followed by colonoscopy with snare polypectomy  . EYE SURGERY Bilateral    cataract surgery with lens implants  . REVISION TOTAL HIP ARTHROPLASTY Right   . right knee replacement  02/2013  . SHOULDER ARTHROSCOPY Left 2005  . SHOULDER ARTHROSCOPY Right   . SPINAL FUSION    . SPINAL FUSION  2004   L4-L5  . TOTAL HIP ARTHROPLASTY Right 06/2006  . TOTAL HIP ARTHROPLASTY  8/12  . TOTAL KNEE ARTHROPLASTY Left 04/18/2020  . TOTAL KNEE ARTHROPLASTY Left 04/18/2020   Procedure: LEFT TOTAL KNEE ARTHROPLASTY;  Surgeon: Leandrew Koyanagi, MD;  Location: [place];  Service: Orthopedics;  Laterality: Left;  . TOTAL SHOULDER REPLACEMENT Left 08/2005    There were no vitals filed for this visit.   Subjective Assessment - 05/17/20 1526    Subjective Patient says she is doing ok. Says she doesn't have much energy and it saps her strength to do much of anything right now.    Limitations Standing;Walking;House hold activities    How long can you stand comfortably? 5 minutes    How long can you walk comfortably? 5 minutes    Patient Stated Goals Be able to walk, be more balanced    Currently in Pain? Yes    Pain Score 5     Pain Location Knee    Pain Orientation Left;Anterior    Pain Descriptors / Indicators Aching    Pain Type Surgical pain    Pain Onset 1 to 4 weeks ago    Pain Frequency Constant              [facility] PT  Assessment - 05/17/20 0001      Observation/Other Assessments   Focus on Therapeutic Outcomes (FOTO)  53% limited                          [facility] Adult PT Treatment/Exercise - 05/17/20 0001      Knee/Hip Exercises: Standing   Heel Raises Both;2 sets;10 reps      Knee/Hip Exercises: Seated   Sit to Sand 1 set;10 reps;with UE support      Knee/Hip Exercises: Supine   Quad Sets Left;10 reps    Quad Sets Limitations 5 sec hold     Heel Slides Left;10 reps    Bridges Both;1 set;10 reps    Straight Leg Raises Left;10 reps    Knee Extension Left;AROM    Knee Extension Limitations 2    Knee Flexion Left;AROM    Knee Flexion Limitations 117      Manual Therapy   Manual Therapy Soft tissue mobilization    Manual therapy comments completed separate rest of treatment    Soft tissue mobilization STM to distal LT quad and adductors for pain and restrictions                    PT Short Term Goals - 05/11/20 1028      PT SHORT TERM GOAL #1   Title Patient will be independent with initial HEP and self-management strategies to improve functional outcomes    Time 3    Period Weeks    Status New    Target Date 06/03/20      PT SHORT TERM GOAL #2   Title Patient will report at least 25% overall improvement in subjective complaint to indicate improvement in ability to perform ADLs.    Time 3    Period Weeks    Status New    Target Date 06/03/20             PT Long Term Goals - 05/11/20 1029      PT LONG TERM GOAL #1   Title Patient will be able to ambulate at least 325 feet during 2MWT with LRAD to demonstrate improved ability to perform functional mobility and associated tasks.    Time 6    Period Weeks    Status New    Target Date 06/24/20      PT LONG TERM GOAL #2   Title Patient will report at least 75% overall improvement in subjective complaint to  indicate improvement in ability to perform ADLs.    Time 6    Period Weeks    Status New     Target Date 06/24/20      PT LONG TERM GOAL #3   Title Patient will improve FOTO score by 10 % to indicate improvement in functional outcomes    Time 6    Period Weeks    Status New    Target Date 06/24/20      PT LONG TERM GOAL #4   Title Patient will have LT knee AROM 0-120 degrees to improve functional mobility and facilitate squatting to pick up items from floor.    Time 6    Period Weeks    Status New    Target Date 06/24/20                 Plan - 05/17/20 1617    Clinical Impression Statement Patient tolerated session well today. Reviewed therapy goals and completed FOTO survey. Initiated ther ex program. Patient educated on proper form and function of all added exercise. Added manual therapy to address patient complaint of pain and ROM restrictions. Patient with decreased pain reported post treatment. Patient will continue to benefit from skilled therapy services to progress knee strength and mobility to reduce pain and improve functional mobility.    Personal Factors and Comorbidities Age;Comorbidity 3+    Comorbidities fibromyalgia, HTN, arthritis    Examination-Activity Limitations Locomotion Level;Transfers;Stand;Stairs;Squat;Bend    Examination-Participation Restrictions Laundry;Community Activity;Cleaning    Stability/Clinical Decision Making Stable/Uncomplicated    Rehab Potential Good    PT Frequency 3x / week    PT Duration 6 weeks    PT Treatment/Interventions ADLs/Self Care Home Management;Aquatic Therapy;Biofeedback;Cryotherapy;Electrical Stimulation;Iontophoresis 4mg /ml Dexamethasone;Moist Heat;Traction;Balance training;Manual lymph drainage;Manual techniques;Therapeutic exercise;Therapeutic activities;Functional mobility training;Splinting;Taping;Vasopneumatic Device;Vestibular;Energy conservation;Dry needling;Gait training;Orthotic Fit/Training;Stair training;DME Instruction;Patient/family education;Cognitive remediation;Contrast Bath;Scar mobilization;Passive  range of motion;Spinal Manipulations;Joint Manipulations;Compression bandaging;Neuromuscular re-education;Ultrasound;Parrafin;Fluidtherapy    PT Next Visit Plan Progress knee strength and ROM as able. Add tandem stance, and step ups next visit    PT Home Exercise Plan 05/11/20: quad set, heel slide, SLR 05/17/20: heel raise, sit to stand    Consulted and Agree with Plan of Care Patient           Patient will benefit from skilled therapeutic intervention in order to improve the following deficits and impairments:  Abnormal gait, Decreased endurance, Hypomobility, Increased edema, Decreased scar mobility, Decreased activity tolerance, Decreased balance, Decreased mobility, Difficulty walking, Decreased strength, Pain, Impaired flexibility, Decreased range of motion  Visit Diagnosis: Left knee pain, unspecified chronicity  Stiffness of left knee, not elsewhere classified  Other abnormalities of gait and mobility     Problem List Patient Active Problem List   Diagnosis Date Noted  . Status post total left knee replacement 04/18/2020  . Primary osteoarthritis of left knee 04/17/2020  . Scalp laceration 03/01/2020  . Abdominal pain, epigastric 08/14/2019  . Midsternal chest pain 08/19/2015  . Bipolar disorder ([place])   . Hypothyroidism 08/04/2015    Class: Diagnosis of  . Chronic cough 11/23/2014  . Fecal incontinence 11/23/2014  . Constipation 09/22/2013  . H/O adenomatous polyp of colon 09/22/2013  . Muscle weakness (generalized) 04/27/2013  . Chronic fatigue 03/05/2013  . Chronic pain syndrome 03/05/2013  . Vitamin D deficiency 03/05/2013  . Insomnia 03/05/2013  . Hot flashes 03/05/2013  . History of gastroesophageal reflux (GERD) 03/05/2013  . History of back surgery 03/05/2013  . H/O anxiety disorder 03/05/2013  . HTN (hypertension) 10/15/2011  .  Difficulty in walking(719.7) 07/26/2011  . Hyperlipidemia 05/11/2009  . BIPOLAR AFFECTIVE DISORDER 05/11/2009  . Coronary artery  disease 05/11/2009  . GERD 05/11/2009  . Fibromyalgia 05/11/2009  . DIZZINESS 05/11/2009  . Chest pain 05/11/2009    4:21 PM, 05/17/20 Josue Hector PT DPT  Physical Therapist with [facility]  [phone]   [facility] [address] [place], [place], [address] Phone: [phone]   Fax:  [phone]  Name: [name] [name] MRN: [id number] Date of Birth: [birth year]

## 2020-05-18 ENCOUNTER — Ambulatory Visit (HOSPITAL_COMMUNITY): Payer: Medicare PPO | Admitting: Physical Therapy

## 2020-05-18 DIAGNOSIS — M25562 Pain in left knee: Secondary | ICD-10-CM

## 2020-05-18 DIAGNOSIS — M25662 Stiffness of left knee, not elsewhere classified: Secondary | ICD-10-CM

## 2020-05-18 DIAGNOSIS — R2689 Other abnormalities of gait and mobility: Secondary | ICD-10-CM

## 2020-05-18 NOTE — Therapy (Signed)
[place], [place], [address] Phone: [phone]   Fax:  [phone]  Physical Therapy Treatment  Patient Details  Name: [name] [name] MRN: [id number] Date of Birth: 1941-10-07 Referring Provider (PT): Smitty Knudsen MD    Encounter Date: 05/18/2020   PT End of Session - 05/18/20 1213    Visit Number 3    Number of Visits 18    Date for PT Re-Evaluation 06/24/20    Authorization Type [employer]    Authorization Time Period 05/11/20-06/24/20    Authorization - Visit Number 2    Authorization - Number of Visits 12    Progress Note Due on Visit 10    PT Start Time 1132    PT Stop Time 1213    PT Time Calculation (min) 41 min    Equipment Utilized During Treatment Gait belt    Activity Tolerance Patient tolerated treatment well    Behavior During Therapy [facility] for tasks assessed/performed           Past Medical History:  Diagnosis Date  . Anemia   . Anxiety   . Arthritis   . Bipolar affective disorder ([place])   . Cataract   . Chronic back pain   . Chronic fatigue   . Complication of anesthesia   . Constipation   . Depression   . Dizziness   . Dyslipidemia   . Fibromyalgia   . GERD (gastroesophageal reflux disease)   . H/O: hysterectomy   . History of kidney stones   . Hx of adenomatous colonic polyps 2005  . Hyperlipidemia   . Hypertension   . Hypothyroidism   . Knee pain   . Non-obstructive CAD    a. 08/2015 Cath: LM 40, LCX small, 30ost, 23m, OM2 small, RCA nl/small, EF 55-65%.  . Osteopenia   . PONV (postoperative nausea and vomiting)   . Sciatica     Past Surgical History:  Procedure Laterality Date  . ABDOMINAL HYSTERECTOMY  1994   TAH- DUB, BSO  . [place] bunionectomy     bilateral  . BREAST BIOPSY Left 1989  . BREAST LUMPECTOMY    . BUNIONECTOMY Left 2005  . CARDIAC CATHETERIZATION  2010   +CAD  . CARDIAC CATHETERIZATION N/A 08/19/2015   Procedure: Left Heart Cath and Coronary  Angiography;  Surgeon: Belva Crome, MD;  Location: [place] [facility];  Service: Cardiovascular;  Laterality: N/A;  . COLONOSCOPY  01/29/2007   EXH:BZJIRC rectum, left-sided diverticula, 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  Remainder of colonic mucosa was normal  . COLONOSCOPY WITH ESOPHAGOGASTRODUODENOSCOPY (EGD) N/A 02/01/2014   VEL:FYBO erosive reflux/gastric polyp/normal rectum/scattered left sided diverticula, normal distal TI. gastric bx negative, fundic gland polyp. next TCS 01/2019.  Marland Kitchen CYSTECTOMY     pilonidial cyst  . ESOPHAGOGASTRODUODENOSCOPY     followed by colonoscopy with snare polypectomy  . EYE SURGERY Bilateral    cataract surgery with lens implants  . REVISION TOTAL HIP ARTHROPLASTY Right   . right knee replacement  02/2013  . SHOULDER ARTHROSCOPY Left 2005  . SHOULDER ARTHROSCOPY Right   . SPINAL FUSION    . SPINAL FUSION  2004   L4-L5  . TOTAL HIP ARTHROPLASTY Right 06/2006  . TOTAL HIP ARTHROPLASTY  8/12  . TOTAL KNEE ARTHROPLASTY Left 04/18/2020  . TOTAL KNEE ARTHROPLASTY Left 04/18/2020   Procedure: LEFT TOTAL KNEE ARTHROPLASTY;  Surgeon: Leandrew Koyanagi, MD;  Location: [facility];  Service: Orthopedics;  Laterality: Left;  . TOTAL SHOULDER REPLACEMENT Left 08/2005    There were no vitals filed for this visit.   Subjective Assessment - 05/18/20 1140    Subjective pt states she is hurting today at around 5/10.    Currently in Pain? Yes    Pain Score 5     Pain Location Knee    Pain Orientation Left    Pain Descriptors / Indicators Aching                             [facility] Adult PT Treatment/Exercise - 05/18/20 0001      Knee/Hip Exercises: Stretches   Active Hamstring Stretch Left;3 reps;30 seconds    Active Hamstring Stretch Limitations onto 12" step    Knee: Self-Stretch to increase Flexion Left;10 seconds    Knee: Self-Stretch Limitations 10 reps onto 12" step    Gastroc Stretch Both;3 reps;30 seconds    Gastroc  Stretch Limitations slant board      Knee/Hip Exercises: Standing   Heel Raises 20 reps    Knee Flexion Left;10 reps    Lateral Step Up Left;10 reps;Hand Hold: 1;Step Height: 4"    Forward Step Up Left;10 reps;Hand Hold: 1    SLS 15" X 2 each with intermittent HHA    Other Standing Knee Exercises tandem stance 30" x2 each LE lead      Knee/Hip Exercises: Seated   Sit to Sand 1 set;10 reps;with UE support      Knee/Hip Exercises: Supine   Quad Sets Left;10 reps    [employer] Limitations 5" holds    Short Arc [employer] Left;10 reps    Short Arc Quad Sets Limitations 5" holds    Heel Slides Left;10 reps    Bridges Both;1 set;10 reps    Straight Leg Raises 15 reps    Knee Extension Left;AROM    Knee Extension Limitations 2    Knee Flexion Left;AROM    Knee Flexion Limitations 120      Manual Therapy   Manual Therapy Soft tissue mobilization    Manual therapy comments completed separate rest of treatment    Soft tissue mobilization STM to distal LT quad and adductors for pain and restrictions                    PT Short Term Goals - 05/11/20 1028      PT SHORT TERM GOAL #1   Title Patient will be independent with initial HEP and self-management strategies to improve functional outcomes    Time 3    Period Weeks    Status New    Target Date 06/03/20      PT SHORT TERM GOAL #2   Title Patient will report at least 25% overall improvement in subjective complaint to indicate improvement in ability to perform ADLs.    Time 3    Period Weeks    Status New    Target Date 06/03/20             PT Long Term Goals - 05/11/20 1029      PT LONG TERM GOAL #1   Title Patient will be able to ambulate at least 325 feet during 2MWT with LRAD to demonstrate improved ability to perform functional mobility and associated tasks.    Time 6    Period Weeks    Status New    Target Date 06/24/20  PT LONG TERM GOAL #2   Title Patient will report at least 75% overall  improvement in subjective complaint to indicate improvement in ability to perform ADLs.    Time 6    Period Weeks    Status New    Target Date 06/24/20      PT LONG TERM GOAL #3   Title Patient will improve FOTO score by 10 % to indicate improvement in functional outcomes    Time 6    Period Weeks    Status New    Target Date 06/24/20      PT LONG TERM GOAL #4   Title Patient will have LT knee AROM 0-120 degrees to improve functional mobility and facilitate squatting to pick up items from floor.    Time 6    Period Weeks    Status New    Target Date 06/24/20                 Plan - 05/18/20 1224    Clinical Impression Statement ROM now at 120 degrees so increased LE strengthening exercises.  Some tightness remains over scar, however completeted only 2-3 minutes of manual for this.  Added step ups and began LE balance exercises.  Pt wtih diffiuclty manintaining balance either Single leg or in tandem.  No pain voiced or noted discomfort with any activity.    Personal Factors and Comorbidities Age;Comorbidity 3+    Comorbidities fibromyalgia, HTN, arthritis    Examination-Activity Limitations Locomotion Level;Transfers;Stand;Stairs;Squat;Bend    Examination-Participation Restrictions Laundry;Community Activity;Cleaning    Stability/Clinical Decision Making Stable/Uncomplicated    Rehab Potential Good    PT Frequency 3x / week    PT Duration 6 weeks    PT Treatment/Interventions ADLs/Self Care Home Management;Aquatic Therapy;Biofeedback;Cryotherapy;Electrical Stimulation;Iontophoresis 4mg /ml Dexamethasone;Moist Heat;Traction;Balance training;Manual lymph drainage;Manual techniques;Therapeutic exercise;Therapeutic activities;Functional mobility training;Splinting;Taping;Vasopneumatic Device;Vestibular;Energy conservation;Dry needling;Gait training;Orthotic Fit/Training;Stair training;DME Instruction;Patient/family education;Cognitive remediation;Contrast Bath;Scar  mobilization;Passive range of motion;Spinal Manipulations;Joint Manipulations;Compression bandaging;Neuromuscular re-education;Ultrasound;Parrafin;Fluidtherapy    PT Next Visit Plan Progress functional strenghening exercises and balance.    PT Home Exercise Plan 05/11/20: quad set, heel slide, SLR 05/17/20: heel raise, sit to stand    Consulted and Agree with Plan of Care Patient           Patient will benefit from skilled therapeutic intervention in order to improve the following deficits and impairments:  Abnormal gait, Decreased endurance, Hypomobility, Increased edema, Decreased scar mobility, Decreased activity tolerance, Decreased balance, Decreased mobility, Difficulty walking, Decreased strength, Pain, Impaired flexibility, Decreased range of motion  Visit Diagnosis: Other abnormalities of gait and mobility  Stiffness of left knee, not elsewhere classified  Left knee pain, unspecified chronicity     Problem List Patient Active Problem List   Diagnosis Date Noted  . Status post total left knee replacement 04/18/2020  . Primary osteoarthritis of left knee 04/17/2020  . Scalp laceration 03/01/2020  . Abdominal pain, epigastric 08/14/2019  . Midsternal chest pain 08/19/2015  . Bipolar disorder ([place])   . Hypothyroidism 08/04/2015    Class: Diagnosis of  . Chronic cough 11/23/2014  . Fecal incontinence 11/23/2014  . Constipation 09/22/2013  . H/O adenomatous polyp of colon 09/22/2013  . Muscle weakness (generalized) 04/27/2013  . Chronic fatigue 03/05/2013  . Chronic pain syndrome 03/05/2013  . Vitamin D deficiency 03/05/2013  . Insomnia 03/05/2013  . Hot flashes 03/05/2013  . History of gastroesophageal reflux (GERD) 03/05/2013  . History of back surgery 03/05/2013  . H/O anxiety disorder 03/05/2013  . HTN (hypertension) 10/15/2011  .  Difficulty in walking(719.7) 07/26/2011  . Hyperlipidemia 05/11/2009  . BIPOLAR AFFECTIVE DISORDER 05/11/2009  . Coronary artery  disease 05/11/2009  . GERD 05/11/2009  . Fibromyalgia 05/11/2009  . DIZZINESS 05/11/2009  . Chest pain 05/11/2009   Teena Irani, PTA/CLT [phone]  Teena Irani 05/18/2020, 12:26 PM  [place] [address] [place], [place], [address] Phone: [phone]   Fax:  [phone]  Name: [name] [name] MRN: [id number] Date of Birth: 1941-08-08

## 2020-05-20 ENCOUNTER — Telehealth: Payer: Self-pay | Admitting: *Deleted

## 2020-05-20 ENCOUNTER — Other Ambulatory Visit: Payer: Self-pay | Admitting: *Deleted

## 2020-05-20 MED ORDER — MELOXICAM 7.5 MG PO TABS: 7.5000 mg | ORAL_TABLET | Freq: Two times a day (BID) | ORAL | 0 refills | Status: DC

## 2020-05-20 NOTE — Telephone Encounter (Signed)
Attempted Ortho bundle 30 day call; Left VM on cell number and will call back at a later time if don't hear back from patient.

## 2020-05-23 ENCOUNTER — Telehealth: Payer: Self-pay | Admitting: *Deleted

## 2020-05-23 ENCOUNTER — Encounter (HOSPITAL_COMMUNITY): Payer: Self-pay | Admitting: Physical Therapy

## 2020-05-23 ENCOUNTER — Other Ambulatory Visit: Payer: Self-pay

## 2020-05-23 ENCOUNTER — Ambulatory Visit (HOSPITAL_COMMUNITY): Payer: Medicare PPO | Admitting: Physical Therapy

## 2020-05-23 DIAGNOSIS — R2689 Other abnormalities of gait and mobility: Secondary | ICD-10-CM

## 2020-05-23 DIAGNOSIS — M25662 Stiffness of left knee, not elsewhere classified: Secondary | ICD-10-CM | POA: Diagnosis not present

## 2020-05-23 DIAGNOSIS — F3176 Bipolar disorder, in full remission, most recent episode depressed: Secondary | ICD-10-CM | POA: Diagnosis not present

## 2020-05-23 DIAGNOSIS — M25562 Pain in left knee: Secondary | ICD-10-CM | POA: Diagnosis not present

## 2020-05-23 NOTE — Therapy (Signed)
[place], [place], [address] Phone: [phone]   Fax:  [phone]  Physical Therapy Treatment  Patient Details  Name: KILEEN LANGE MRN: [id number] Date of Birth: 1940-12-01 Referring Provider (PT): Smitty Knudsen MD    Encounter Date: 05/23/2020   PT End of Session - 05/23/20 1736    Visit Number 4    Number of Visits 18    Date for PT Re-Evaluation 06/24/20    Authorization Type [employer]    Authorization Time Period 05/11/20-06/24/20    Authorization - Visit Number 3    Authorization - Number of Visits 12    Progress Note Due on Visit 10    PT Start Time 1732    PT Stop Time 1810    PT Time Calculation (min) 38 min    Equipment Utilized During Treatment Gait belt    Activity Tolerance Patient tolerated treatment well    Behavior During Therapy [facility] for tasks assessed/performed           Past Medical History:  Diagnosis Date  . Anemia   . Anxiety   . Arthritis   . Bipolar affective disorder ([place])   . Cataract   . Chronic back pain   . Chronic fatigue   . Complication of anesthesia   . Constipation   . Depression   . Dizziness   . Dyslipidemia   . Fibromyalgia   . GERD (gastroesophageal reflux disease)   . H/O: hysterectomy   . History of kidney stones   . Hx of adenomatous colonic polyps 2005  . Hyperlipidemia   . Hypertension   . Hypothyroidism   . Knee pain   . Non-obstructive CAD    a. 08/2015 Cath: LM 40, LCX small, 30ost, 58m, OM2 small, RCA nl/small, EF 55-65%.  . Osteopenia   . PONV (postoperative nausea and vomiting)   . Sciatica     Past Surgical History:  Procedure Laterality Date  . ABDOMINAL HYSTERECTOMY  1994   TAH- DUB, BSO  . [place] bunionectomy     bilateral  . BREAST BIOPSY Left 1989  . BREAST LUMPECTOMY    . BUNIONECTOMY Left 2005  . CARDIAC CATHETERIZATION  2010   +CAD  . CARDIAC CATHETERIZATION N/A 08/19/2015   Procedure: Left Heart Cath and Coronary  Angiography;  Surgeon: Belva Crome, MD;  Location: [facility];  Service: Cardiovascular;  Laterality: N/A;  . COLONOSCOPY  01/29/2007   UUV:OZDGUY rectum, left-sided diverticula, 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  Remainder of colonic mucosa was normal  . COLONOSCOPY WITH ESOPHAGOGASTRODUODENOSCOPY (EGD) N/A 02/01/2014   QIH:KVQQ erosive reflux/gastric polyp/normal rectum/scattered left sided diverticula, normal distal TI. gastric bx negative, fundic gland polyp. next TCS 01/2019.  Marland Kitchen CYSTECTOMY     pilonidial cyst  . ESOPHAGOGASTRODUODENOSCOPY     followed by colonoscopy with snare polypectomy  . EYE SURGERY Bilateral    cataract surgery with lens implants  . REVISION TOTAL HIP ARTHROPLASTY Right   . right knee replacement  02/2013  . SHOULDER ARTHROSCOPY Left 2005  . SHOULDER ARTHROSCOPY Right   . SPINAL FUSION    . SPINAL FUSION  2004   L4-L5  . TOTAL HIP ARTHROPLASTY Right 06/2006  . TOTAL HIP ARTHROPLASTY  8/12  . TOTAL KNEE ARTHROPLASTY Left 04/18/2020  . TOTAL KNEE ARTHROPLASTY Left 04/18/2020   Procedure: LEFT TOTAL KNEE ARTHROPLASTY;  Surgeon: Leandrew Koyanagi, MD;  Location: [facility];  Service: Orthopedics;  Laterality: Left;  . TOTAL SHOULDER REPLACEMENT Left 08/2005    There were no vitals filed for this visit.   Subjective Assessment - 05/23/20 1735    Subjective Patient says she is not having too much pain in knee, says she has been busy lately and is tired. Says HEP is going better    Currently in Pain? No/denies                             [facility] Adult PT Treatment/Exercise - 05/23/20 0001      Knee/Hip Exercises: Stretches   Active Hamstring Stretch Left;3 reps;30 seconds    Active Hamstring Stretch Limitations onto 12" step    Knee: Self-Stretch to increase Flexion Left;5 reps;10 seconds    Knee: Self-Stretch Limitations 12" step     Gastroc Stretch Both;3 reps;30 seconds    Gastroc Stretch Limitations slant board       Knee/Hip Exercises: Standing   Heel Raises 20 reps    Knee Flexion Left;2 sets;10 reps    Hip Abduction Both;2 sets;10 reps    Lateral Step Up Left;Step Height: 4";15 reps;Hand Hold: 2    Forward Step Up Left;Step Height: 4";15 reps;Hand Hold: 1    SLS 3 x 10" holds with intermittent HHA     Gait Training 200 feet with no AD, CG assist     Other Standing Knee Exercises tandem stance 30" x2 each LE lead      Knee/Hip Exercises: Seated   Sit to Sand 1 set;10 reps;with UE support      Knee/Hip Exercises: Supine   Knee Extension Left;AROM    Knee Extension Limitations 2    Knee Flexion Left;AROM    Knee Flexion Limitations 120      Manual Therapy   Manual Therapy Soft tissue mobilization;Myofascial release    Manual therapy comments completed separate rest of treatment    Soft tissue mobilization STM to LT distal quad    Myofascial Release scar tissue message                     PT Short Term Goals - 05/11/20 1028      PT SHORT TERM GOAL #1   Title Patient will be independent with initial HEP and self-management strategies to improve functional outcomes    Time 3    Period Weeks    Status New    Target Date 06/03/20      PT SHORT TERM GOAL #2   Title Patient will report at least 25% overall improvement in subjective complaint to indicate improvement in ability to perform ADLs.    Time 3    Period Weeks    Status New    Target Date 06/03/20             PT Long Term Goals - 05/11/20 1029      PT LONG TERM GOAL #1   Title Patient will be able to ambulate at least 325 feet during 2MWT with LRAD to demonstrate improved ability to perform functional mobility and associated tasks.    Time 6    Period Weeks    Status New    Target Date 06/24/20      PT LONG TERM GOAL #2   Title Patient will report at least 75% overall improvement in subjective complaint to indicate improvement in ability to perform ADLs.    Time 6    Period Weeks  Status New    Target  Date 06/24/20      PT LONG TERM GOAL #3   Title Patient will improve FOTO score by 10 % to indicate improvement in functional outcomes    Time 6    Period Weeks    Status New    Target Date 06/24/20      PT LONG TERM GOAL #4   Title Patient will have LT knee AROM 0-120 degrees to improve functional mobility and facilitate squatting to pick up items from floor.    Time 6    Period Weeks    Status New    Target Date 06/24/20                 Plan - 05/23/20 1830    Clinical Impression Statement Patetin progressing well toward therapy goals. Patient continues to be challenged with static balance activity. Patient able to progress standing LE strengthening with added hip abduction today. Patient able to perform gait training with no AD today, but demos significant gait deviations. Patient did demo good stability without use of cane. Patient will continue to benefit from skilled therapy services to progress knee strength and mobility to reduce pain and improve LOF with ADLs.    Personal Factors and Comorbidities Age;Comorbidity 3+    Comorbidities fibromyalgia, HTN, arthritis    Examination-Activity Limitations Locomotion Level;Transfers;Stand;Stairs;Squat;Bend    Examination-Participation Restrictions Laundry;Community Activity;Cleaning    Stability/Clinical Decision Making Stable/Uncomplicated    Rehab Potential Good    PT Frequency 3x / week    PT Duration 6 weeks    PT Treatment/Interventions ADLs/Self Care Home Management;Aquatic Therapy;Biofeedback;Cryotherapy;Electrical Stimulation;Iontophoresis 4mg /ml Dexamethasone;Moist Heat;Traction;Balance training;Manual lymph drainage;Manual techniques;Therapeutic exercise;Therapeutic activities;Functional mobility training;Splinting;Taping;Vasopneumatic Device;Vestibular;Energy conservation;Dry needling;Gait training;Orthotic Fit/Training;Stair training;DME Instruction;Patient/family education;Cognitive remediation;Contrast Bath;Scar  mobilization;Passive range of motion;Spinal Manipulations;Joint Manipulations;Compression bandaging;Neuromuscular re-education;Ultrasound;Parrafin;Fluidtherapy    PT Next Visit Plan Progress functional strengthening exercises and balance as tolerated. Increase step height, add step downs next visit    PT Home Exercise Plan 05/11/20: quad set, heel slide, SLR 05/17/20: heel raise, sit to stand    Consulted and Agree with Plan of Care Patient           Patient will benefit from skilled therapeutic intervention in order to improve the following deficits and impairments:  Abnormal gait, Decreased endurance, Hypomobility, Increased edema, Decreased scar mobility, Decreased activity tolerance, Decreased balance, Decreased mobility, Difficulty walking, Decreased strength, Pain, Impaired flexibility, Decreased range of motion  Visit Diagnosis: Other abnormalities of gait and mobility  Stiffness of left knee, not elsewhere classified  Left knee pain, unspecified chronicity     Problem List Patient Active Problem List   Diagnosis Date Noted  . Status post total left knee replacement 04/18/2020  . Primary osteoarthritis of left knee 04/17/2020  . Scalp laceration 03/01/2020  . Abdominal pain, epigastric 08/14/2019  . Midsternal chest pain 08/19/2015  . Bipolar disorder ([place])   . Hypothyroidism 08/04/2015    Class: Diagnosis of  . Chronic cough 11/23/2014  . Fecal incontinence 11/23/2014  . Constipation 09/22/2013  . H/O adenomatous polyp of colon 09/22/2013  . Muscle weakness (generalized) 04/27/2013  . Chronic fatigue 03/05/2013  . Chronic pain syndrome 03/05/2013  . Vitamin D deficiency 03/05/2013  . Insomnia 03/05/2013  . Hot flashes 03/05/2013  . History of gastroesophageal reflux (GERD) 03/05/2013  . History of back surgery 03/05/2013  . H/O anxiety disorder 03/05/2013  . HTN (hypertension) 10/15/2011  . Difficulty in walking(719.7) 07/26/2011  . Hyperlipidemia 05/11/2009  .  BIPOLAR AFFECTIVE DISORDER 05/11/2009  . Coronary artery disease 05/11/2009  . GERD 05/11/2009  . Fibromyalgia 05/11/2009  . DIZZINESS 05/11/2009  . Chest pain 05/11/2009    6:35 PM, 05/23/20 Josue Hector PT DPT  Physical Therapist with [facility]  [phone]   [facility] [address] [place], [place], [address] Phone: [phone]   Fax:  [phone]  Name: PENI RUPARD MRN: [id number] Date of Birth: June 27, 1941

## 2020-05-23 NOTE — Telephone Encounter (Signed)
30 day Ortho bundle call completed.

## 2020-05-25 ENCOUNTER — Other Ambulatory Visit: Payer: Self-pay

## 2020-05-25 ENCOUNTER — Ambulatory Visit (HOSPITAL_COMMUNITY): Payer: Medicare PPO

## 2020-05-25 ENCOUNTER — Encounter (HOSPITAL_COMMUNITY): Payer: Self-pay

## 2020-05-25 DIAGNOSIS — R2689 Other abnormalities of gait and mobility: Secondary | ICD-10-CM

## 2020-05-25 DIAGNOSIS — M25662 Stiffness of left knee, not elsewhere classified: Secondary | ICD-10-CM | POA: Diagnosis not present

## 2020-05-25 DIAGNOSIS — M25562 Pain in left knee: Secondary | ICD-10-CM

## 2020-05-25 NOTE — Patient Instructions (Signed)
Single Leg Balance: Eyes Open    Stand on right leg with eyes open. Hold 30 seconds. 5 reps 7 times per week.  http://ggbe.exer.us/5   Copyright  VHI. All rights reserved.   Tandem Stance    Right foot in front of left, heel touching toe both feet "straight ahead". Stand on Foot Triangle of Support with both feet. Balance in this position 30 seconds. Do with left foot in front of right. Repeat 3 sets per day. Copyright  VHI. All rights reserved.

## 2020-05-25 NOTE — Therapy (Addendum)
[place], [place], [address] Phone: [phone]   Fax:  [phone]  Physical Therapy Treatment AROM 2-150 degrees  Patient Details  Name: [name] [name] MRN: [id number] Date of Birth: 04-18-1941 Referring Provider (PT): Smitty Knudsen MD    Encounter Date: 05/25/2020   PT End of Session - 05/25/20 1145    Visit Number 5    Number of Visits 18    Date for PT Re-Evaluation 06/24/20    Authorization Type [employer]    Authorization Time Period 05/11/20-06/24/20    Authorization - Visit Number 4    Authorization - Number of Visits 12    Progress Note Due on Visit 10    PT Start Time 9678    PT Stop Time 1215    PT Time Calculation (min) 39 min    Equipment Utilized During Treatment --   CGA   Activity Tolerance Patient tolerated treatment well    Behavior During Therapy [facility] for tasks assessed/performed           Past Medical History:  Diagnosis Date  . Anemia   . Anxiety   . Arthritis   . Bipolar affective disorder ([place])   . Cataract   . Chronic back pain   . Chronic fatigue   . Complication of anesthesia   . Constipation   . Depression   . Dizziness   . Dyslipidemia   . Fibromyalgia   . GERD (gastroesophageal reflux disease)   . H/O: hysterectomy   . History of kidney stones   . Hx of adenomatous colonic polyps 2005  . Hyperlipidemia   . Hypertension   . Hypothyroidism   . Knee pain   . Non-obstructive CAD    a. 08/2015 Cath: LM 40, LCX small, 30ost, 40m, OM2 small, RCA nl/small, EF 55-65%.  . Osteopenia   . PONV (postoperative nausea and vomiting)   . Sciatica     Past Surgical History:  Procedure Laterality Date  . ABDOMINAL HYSTERECTOMY  1994   TAH- DUB, BSO  . [place] bunionectomy     bilateral  . BREAST BIOPSY Left 1989  . BREAST LUMPECTOMY    . BUNIONECTOMY Left 2005  . CARDIAC CATHETERIZATION  2010   +CAD  . CARDIAC CATHETERIZATION N/A 08/19/2015   Procedure: Left Heart  Cath and Coronary Angiography;  Surgeon: Belva Crome, MD;  Location: [place] [facility];  Service: Cardiovascular;  Laterality: N/A;  . COLONOSCOPY  01/29/2007   LFY:BOFBPZ rectum, left-sided diverticula, 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  Remainder of colonic mucosa was normal  . COLONOSCOPY WITH ESOPHAGOGASTRODUODENOSCOPY (EGD) N/A 02/01/2014   [facility]:ENID erosive reflux/gastric polyp/normal rectum/scattered left sided diverticula, normal distal TI. gastric bx negative, fundic gland polyp. next TCS 01/2019.  Marland Kitchen CYSTECTOMY     pilonidial cyst  . ESOPHAGOGASTRODUODENOSCOPY     followed by colonoscopy with snare polypectomy  . EYE SURGERY Bilateral    cataract surgery with lens implants  . REVISION TOTAL HIP ARTHROPLASTY Right   . right knee replacement  02/2013  . SHOULDER ARTHROSCOPY Left 2005  . SHOULDER ARTHROSCOPY Right   . SPINAL FUSION    . SPINAL FUSION  2004   L4-L5  . TOTAL HIP ARTHROPLASTY Right 06/2006  . TOTAL HIP ARTHROPLASTY  8/12  . TOTAL KNEE ARTHROPLASTY Left 04/18/2020  . TOTAL KNEE ARTHROPLASTY Left 04/18/2020   Procedure: LEFT TOTAL KNEE ARTHROPLASTY;  Surgeon: Leandrew Koyanagi, MD;  Location: [facility]  OR;  Service: Orthopedics;  Laterality: Left;  . TOTAL SHOULDER REPLACEMENT Left 08/2005    There were no vitals filed for this visit.   Subjective Assessment - 05/25/20 1141    Subjective Pt stated she has some soreness in knee today, pain scale 4/10 Lt knee pain.  Has been icing regularly that does help wiht pain.    Patient Stated Goals Be able to walk, be more balanced    Currently in Pain? Yes    Pain Score 4     Pain Location Knee    Pain Orientation Left    Pain Descriptors / Indicators Aching;Sore    Pain Type Surgical pain    Pain Onset 1 to 4 weeks ago    Pain Frequency Constant    Aggravating Factors  standing, bending, walking, stairs    Pain Relieving Factors rest, meds    Effect of Pain on Daily Activities limits                              [facility] Adult PT Treatment/Exercise - 05/25/20 0001      Knee/Hip Exercises: Standing   Heel Raises 20 reps    Terminal Knee Extension 10 reps    Theraband Level (Terminal Knee Extension) Other (comment)    Terminal Knee Extension Limitations purple band    Lateral Step Up Left;10 reps;Hand Hold: 2;2 sets;Step Height: 6"    Forward Step Up Left;10 reps;2 sets;Hand Hold: 1;Step Height: 6"    Functional Squat 2 sets;10 reps    Functional Squat Limitations cueing for mechanics    SLS 2" holds max of 5    Other Standing Knee Exercises sidestep wiht intermittent HHA on wall    Other Standing Knee Exercises tandem stance 30" x2 each LE lead      Knee/Hip Exercises: Seated   Sit to Sand 1 set;10 reps;with UE support      Manual Therapy   Manual Therapy Soft tissue mobilization;Myofascial release    Manual therapy comments completed separate rest of treatment    Soft tissue mobilization STM to LT distal quad    Myofascial Release scar tissue message                     PT Short Term Goals - 05/11/20 1028      PT SHORT TERM GOAL #1   Title Patient will be independent with initial HEP and self-management strategies to improve functional outcomes    Time 3    Period Weeks    Status New    Target Date 06/03/20      PT SHORT TERM GOAL #2   Title Patient will report at least 25% overall improvement in subjective complaint to indicate improvement in ability to perform ADLs.    Time 3    Period Weeks    Status New    Target Date 06/03/20             PT Long Term Goals - 05/11/20 1029      PT LONG TERM GOAL #1   Title Patient will be able to ambulate at least 325 feet during 2MWT with LRAD to demonstrate improved ability to perform functional mobility and associated tasks.    Time 6    Period Weeks    Status New    Target Date 06/24/20      PT LONG TERM GOAL #2   Title Patient will report  at least 75% overall improvement in  subjective complaint to indicate improvement in ability to perform ADLs.    Time 6    Period Weeks    Status New    Target Date 06/24/20      PT LONG TERM GOAL #3   Title Patient will improve FOTO score by 10 % to indicate improvement in functional outcomes    Time 6    Period Weeks    Status New    Target Date 06/24/20      PT LONG TERM GOAL #4   Title Patient will have LT knee AROM 0-120 degrees to improve functional mobility and facilitate squatting to pick up items from floor.    Time 6    Period Weeks    Status New    Target Date 06/24/20                 Plan - 05/25/20 1353    Clinical Impression Statement Pt is progressing well towards POC.  Progressed functional strengthening and balance training with additional exercises.  Able to increase forward and lateral step up height, added step down and TKE for quad strengthening and squats for gluteal strengthening.  Pt demonstrates gluteal weakness noted with [place] gait mechanics and difficulty with NBOS and SLS activities.  Added balance activiites to HEP, encouraged to complete by counter for [facility] safety.  EOS wiht manual to address scar tissue adhesions and distal quad tightness for pain control.    Comorbidities fibromyalgia, HTN, arthritis    Examination-Activity Limitations Locomotion Level;Transfers;Stand;Stairs;Squat;Bend    Examination-Participation Restrictions Laundry;Community Activity;Cleaning    Stability/Clinical Decision Making Stable/Uncomplicated    Clinical Decision Making Low    Rehab Potential Good    PT Frequency 3x / week    PT Duration 6 weeks    PT Treatment/Interventions ADLs/Self Care Home Management;Aquatic Therapy;Biofeedback;Cryotherapy;Electrical Stimulation;Iontophoresis 4mg /ml Dexamethasone;Moist Heat;Traction;Balance training;Manual lymph drainage;Manual techniques;Therapeutic exercise;Therapeutic activities;Functional mobility training;Splinting;Taping;Vasopneumatic  Device;Vestibular;Energy conservation;Dry needling;Gait training;Orthotic Fit/Training;Stair training;DME Instruction;Patient/family education;Cognitive remediation;Contrast Bath;Scar mobilization;Passive range of motion;Spinal Manipulations;Joint Manipulations;Compression bandaging;Neuromuscular re-education;Ultrasound;Parrafin;Fluidtherapy    PT Next Visit Plan Progress functional strengthening exercises and balance as tolerated.    PT Home Exercise Plan 05/11/20: quad set, heel slide, SLR 05/17/20: heel raise, sit to stand; 05/25/20: SLS and tandem stance by counter           Patient will benefit from skilled therapeutic intervention in order to improve the following deficits and impairments:  Abnormal gait, Decreased endurance, Hypomobility, Increased edema, Decreased scar mobility, Decreased activity tolerance, Decreased balance, Decreased mobility, Difficulty walking, Decreased strength, Pain, Impaired flexibility, Decreased range of motion  Visit Diagnosis: Other abnormalities of gait and mobility  Stiffness of left knee, not elsewhere classified  Left knee pain, unspecified chronicity     Problem List Patient Active Problem List   Diagnosis Date Noted  . Status post total left knee replacement 04/18/2020  . Primary osteoarthritis of left knee 04/17/2020  . Scalp laceration 03/01/2020  . Abdominal pain, epigastric 08/14/2019  . Midsternal chest pain 08/19/2015  . Bipolar disorder ([place])   . Hypothyroidism 08/04/2015    Class: Diagnosis of  . Chronic cough 11/23/2014  . Fecal incontinence 11/23/2014  . Constipation 09/22/2013  . H/O adenomatous polyp of colon 09/22/2013  . Muscle weakness (generalized) 04/27/2013  . Chronic fatigue 03/05/2013  . Chronic pain syndrome 03/05/2013  . Vitamin D deficiency 03/05/2013  . Insomnia 03/05/2013  . Hot flashes 03/05/2013  . History of gastroesophageal reflux (GERD) 03/05/2013  . History of  back surgery 03/05/2013  . H/O anxiety  disorder 03/05/2013  . HTN (hypertension) 10/15/2011  . Difficulty in walking(719.7) 07/26/2011  . Hyperlipidemia 05/11/2009  . BIPOLAR AFFECTIVE DISORDER 05/11/2009  . Coronary artery disease 05/11/2009  . GERD 05/11/2009  . Fibromyalgia 05/11/2009  . DIZZINESS 05/11/2009  . Chest pain 05/11/2009   Ihor Austin, LPTA/CLT; CBIS [phone]  Aldona Lento 05/25/2020, 2:01 PM  [place] [address] [place], [place], [address] Phone: [phone]   Fax:  [phone]  Name: GRIER CZERWINSKI MRN: [id number] Date of Birth: 03-28-1941

## 2020-05-27 ENCOUNTER — Other Ambulatory Visit: Payer: Self-pay

## 2020-05-27 ENCOUNTER — Encounter (HOSPITAL_COMMUNITY): Payer: Self-pay | Admitting: Physical Therapy

## 2020-05-27 ENCOUNTER — Ambulatory Visit (HOSPITAL_COMMUNITY): Payer: Medicare PPO | Admitting: Physical Therapy

## 2020-05-27 DIAGNOSIS — R2689 Other abnormalities of gait and mobility: Secondary | ICD-10-CM

## 2020-05-27 DIAGNOSIS — M25662 Stiffness of left knee, not elsewhere classified: Secondary | ICD-10-CM | POA: Diagnosis not present

## 2020-05-27 DIAGNOSIS — M25562 Pain in left knee: Secondary | ICD-10-CM | POA: Diagnosis not present

## 2020-05-27 NOTE — Therapy (Signed)
[place], [place], [address] Phone: [phone]   Fax:  [phone]  Physical Therapy Treatment  Patient Details  Name: [name] [name] MRN: [id number] Date of Birth: 05-21-1941 Referring Provider (PT): Smitty Knudsen MD    Encounter Date: 05/27/2020   PT End of Session - 05/27/20 1524    Visit Number 6    Number of Visits 18    Date for PT Re-Evaluation 06/24/20    Authorization Type [employer]    Authorization Time Period 05/11/20-06/24/20    Authorization - Visit Number 5    Authorization - Number of Visits 12    Progress Note Due on Visit 10    PT Start Time 1524    PT Stop Time 1555    PT Time Calculation (min) 31 min    Equipment Utilized During Treatment --   CGA   Activity Tolerance Patient tolerated treatment well    Behavior During Therapy [facility] for tasks assessed/performed           Past Medical History:  Diagnosis Date  . Anemia   . Anxiety   . Arthritis   . Bipolar affective disorder ([place])   . Cataract   . Chronic back pain   . Chronic fatigue   . Complication of anesthesia   . Constipation   . Depression   . Dizziness   . Dyslipidemia   . Fibromyalgia   . GERD (gastroesophageal reflux disease)   . H/O: hysterectomy   . History of kidney stones   . Hx of adenomatous colonic polyps 2005  . Hyperlipidemia   . Hypertension   . Hypothyroidism   . Knee pain   . Non-obstructive CAD    a. 08/2015 Cath: LM 40, LCX small, 30ost, 80m, OM2 small, RCA nl/small, EF 55-65%.  . Osteopenia   . PONV (postoperative nausea and vomiting)   . Sciatica     Past Surgical History:  Procedure Laterality Date  . ABDOMINAL HYSTERECTOMY  1994   TAH- DUB, BSO  . [place] bunionectomy     bilateral  . BREAST BIOPSY Left 1989  . BREAST LUMPECTOMY    . BUNIONECTOMY Left 2005  . CARDIAC CATHETERIZATION  2010   +CAD  . CARDIAC CATHETERIZATION N/A 08/19/2015   Procedure: Left Heart Cath and Coronary  Angiography;  Surgeon: Belva Crome, MD;  Location: [place] [facility];  Service: Cardiovascular;  Laterality: N/A;  . 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  Remainder of colonic mucosa was normal  . COLONOSCOPY WITH ESOPHAGOGASTRODUODENOSCOPY (EGD) N/A 02/01/2014   UEK:CMKL erosive reflux/gastric polyp/normal rectum/scattered left sided diverticula, normal distal TI. gastric bx negative, fundic gland polyp. next TCS 01/2019.  Marland Kitchen CYSTECTOMY     pilonidial cyst  . ESOPHAGOGASTRODUODENOSCOPY     followed by colonoscopy with snare polypectomy  . EYE SURGERY Bilateral    cataract surgery with lens implants  . REVISION TOTAL HIP ARTHROPLASTY Right   . right knee replacement  02/2013  . SHOULDER ARTHROSCOPY Left 2005  . SHOULDER ARTHROSCOPY Right   . SPINAL FUSION    . SPINAL FUSION  2004   L4-L5  . TOTAL HIP ARTHROPLASTY Right 06/2006  . TOTAL HIP ARTHROPLASTY  8/12  . TOTAL KNEE ARTHROPLASTY Left 04/18/2020  . TOTAL KNEE ARTHROPLASTY Left 04/18/2020   Procedure: LEFT TOTAL KNEE ARTHROPLASTY;  Surgeon: Leandrew Koyanagi, MD;  Location: [place];  Service:  Orthopedics;  Laterality: Left;  . TOTAL SHOULDER REPLACEMENT Left 08/2005    There were no vitals filed for this visit.   Subjective Assessment - 05/27/20 1528    Subjective States over all she feels pretty good. States that she is fatigued all over and has about 3/10 in her left knee and it feels achy.    Patient Stated Goals Be able to walk, be more balanced    Currently in Pain? Yes    Pain Score 3     Pain Location Knee    Pain Orientation Left    Pain Descriptors / Indicators Aching;Sore    Pain Type Surgical pain    Pain Onset 1 to 4 weeks ago              [facility] PT Assessment - 05/27/20 0001      Assessment   Medical Diagnosis Lt TKA     Referring Provider (PT) Smitty Knudsen MD     Onset Date/Surgical Date 04/18/20    Next MD Visit 06/07/20                          [facility] Adult PT Treatment/Exercise - 05/27/20 0001      Knee/Hip Exercises: Stretches   Active Hamstring Stretch Left;5 reps;10 seconds   flexion and extension on 12" step   Gastroc Stretch Left;3 reps;30 seconds   slant board    Other Knee/Hip Stretches attempted stretch with strap around back and this was not tolerated so it was stopped      Knee/Hip Exercises: Aerobic   Stationary Bike seat  at position 4 5 minutes       Knee/Hip Exercises: Standing   Lateral Step Up Left;4 sets;5 reps;Hand Hold: 2;Step Height: 6"    Forward Step Up Left;3 sets;10 reps;Hand Hold: 2;Step Height: 6";Hand Hold: 1      Knee/Hip Exercises: Supine   Knee Extension Left;AROM    Knee Extension Limitations 2    Knee Flexion Left;AROM    Knee Flexion Limitations 128      Manual Therapy   Manual Therapy Edema management    Manual therapy comments completed separate rest of treatment    Edema Management edema mobbilization to left knee - 10 minutes.                    PT Short Term Goals - 05/11/20 1028      PT SHORT TERM GOAL #1   Title Patient will be independent with initial HEP and self-management strategies to improve functional outcomes    Time 3    Period Weeks    Status New    Target Date 06/03/20      PT SHORT TERM GOAL #2   Title Patient will report at least 25% overall improvement in subjective complaint to indicate improvement in ability to perform ADLs.    Time 3    Period Weeks    Status New    Target Date 06/03/20             PT Long Term Goals - 05/11/20 1029      PT LONG TERM GOAL #1   Title Patient will be able to ambulate at least 325 feet during 2MWT with LRAD to demonstrate improved ability to perform functional mobility and associated tasks.    Time 6    Period Weeks    Status New    Target Date 06/24/20  PT LONG TERM GOAL #2   Title Patient will report at least 75% overall improvement in subjective complaint to  indicate improvement in ability to perform ADLs.    Time 6    Period Weeks    Status New    Target Date 06/24/20      PT LONG TERM GOAL #3   Title Patient will improve FOTO score by 10 % to indicate improvement in functional outcomes    Time 6    Period Weeks    Status New    Target Date 06/24/20      PT LONG TERM GOAL #4   Title Patient will have LT knee AROM 0-120 degrees to improve functional mobility and facilitate squatting to pick up items from floor.    Time 6    Period Weeks    Status New    Target Date 06/24/20                 Plan - 05/27/20 1524    Clinical Impression Statement Progressed step height to 6 inches today and this was tolerated well. Fatigue noted throughout session so increased rest breaks were taken throughout session. Soreness noted in knee but no real sharp pain. Focused on edema management during session and educated patient on continued elevation above her heart. Will follow up with soreness and swelling next session.    Comorbidities fibromyalgia, HTN, arthritis    Examination-Activity Limitations Locomotion Level;Transfers;Stand;Stairs;Squat;Bend    Examination-Participation Restrictions Laundry;Community Activity;Cleaning    Stability/Clinical Decision Making Stable/Uncomplicated    Rehab Potential Good    PT Frequency 3x / week    PT Duration 6 weeks    PT Treatment/Interventions ADLs/Self Care Home Management;Aquatic Therapy;Biofeedback;Cryotherapy;Electrical Stimulation;Iontophoresis 4mg /ml Dexamethasone;Moist Heat;Traction;Balance training;Manual lymph drainage;Manual techniques;Therapeutic exercise;Therapeutic activities;Functional mobility training;Splinting;Taping;Vasopneumatic Device;Vestibular;Energy conservation;Dry needling;Gait training;Orthotic Fit/Training;Stair training;DME Instruction;Patient/family education;Cognitive remediation;Contrast Bath;Scar mobilization;Passive range of motion;Spinal Manipulations;Joint  Manipulations;Compression bandaging;Neuromuscular re-education;Ultrasound;Parrafin;Fluidtherapy    PT Next Visit Plan Progress functional strengthening exercises and balance as tolerated.    PT Home Exercise Plan 05/11/20: quad set, heel slide, SLR 05/17/20: heel raise, sit to stand; 05/25/20: SLS and tandem stance by counter           Patient will benefit from skilled therapeutic intervention in order to improve the following deficits and impairments:  Abnormal gait, Decreased endurance, Hypomobility, Increased edema, Decreased scar mobility, Decreased activity tolerance, Decreased balance, Decreased mobility, Difficulty walking, Decreased strength, Pain, Impaired flexibility, Decreased range of motion  Visit Diagnosis: Other abnormalities of gait and mobility  Stiffness of left knee, not elsewhere classified  Left knee pain, unspecified chronicity     Problem List Patient Active Problem List   Diagnosis Date Noted  . Status post total left knee replacement 04/18/2020  . Primary osteoarthritis of left knee 04/17/2020  . Scalp laceration 03/01/2020  . Abdominal pain, epigastric 08/14/2019  . Midsternal chest pain 08/19/2015  . Bipolar disorder ([place])   . Hypothyroidism 08/04/2015    Class: Diagnosis of  . Chronic cough 11/23/2014  . Fecal incontinence 11/23/2014  . Constipation 09/22/2013  . H/O adenomatous polyp of colon 09/22/2013  . Muscle weakness (generalized) 04/27/2013  . Chronic fatigue 03/05/2013  . Chronic pain syndrome 03/05/2013  . Vitamin D deficiency 03/05/2013  . Insomnia 03/05/2013  . Hot flashes 03/05/2013  . History of gastroesophageal reflux (GERD) 03/05/2013  . History of back surgery 03/05/2013  . H/O anxiety disorder 03/05/2013  . HTN (hypertension) 10/15/2011  . Difficulty in walking(719.7) 07/26/2011  . Hyperlipidemia 05/11/2009  .  BIPOLAR AFFECTIVE DISORDER 05/11/2009  . Coronary artery disease 05/11/2009  . GERD 05/11/2009  . Fibromyalgia  05/11/2009  . DIZZINESS 05/11/2009  . Chest pain 05/11/2009   .4:01 PM, 05/27/20 Jerene Pitch, DPT Physical Therapy with [facility]  [phone] office  [place] [address] [place], [place], [address] Phone: [phone]   Fax:  [phone]  Name: [name] [name] MRN: [id number] Date of Birth: 07-14-1941

## 2020-05-30 ENCOUNTER — Encounter (HOSPITAL_COMMUNITY): Payer: Self-pay | Admitting: Physical Therapy

## 2020-05-30 ENCOUNTER — Ambulatory Visit (HOSPITAL_COMMUNITY): Payer: Medicare PPO | Admitting: Physical Therapy

## 2020-05-30 ENCOUNTER — Other Ambulatory Visit: Payer: Self-pay

## 2020-05-30 DIAGNOSIS — M25562 Pain in left knee: Secondary | ICD-10-CM

## 2020-05-30 DIAGNOSIS — M25662 Stiffness of left knee, not elsewhere classified: Secondary | ICD-10-CM | POA: Diagnosis not present

## 2020-05-30 DIAGNOSIS — R2689 Other abnormalities of gait and mobility: Secondary | ICD-10-CM

## 2020-05-30 NOTE — Therapy (Signed)
[place], [place], [address] Phone: [phone]   Fax:  [phone]  Physical Therapy Treatment  Patient Details  Name: [name] [name] MRN: [id number] Date of Birth: Nov 15, 1940 Referring Provider (PT): Smitty Knudsen MD    Encounter Date: 05/30/2020   PT End of Session - 05/30/20 1532    Visit Number 7    Number of Visits 18    Date for PT Re-Evaluation 06/24/20    Authorization Type [employer]    Authorization Time Period 05/11/20-06/24/20    Authorization - Visit Number 6    Authorization - Number of Visits 12    Progress Note Due on Visit 10    PT Start Time 1525    PT Stop Time 1605    PT Time Calculation (min) 40 min    Equipment Utilized During Treatment --   CGA   Activity Tolerance Patient tolerated treatment well    Behavior During Therapy [facility] for tasks assessed/performed           Past Medical History:  Diagnosis Date  . Anemia   . Anxiety   . Arthritis   . Bipolar affective disorder ([place])   . Cataract   . Chronic back pain   . Chronic fatigue   . Complication of anesthesia   . Constipation   . Depression   . Dizziness   . Dyslipidemia   . Fibromyalgia   . GERD (gastroesophageal reflux disease)   . H/O: hysterectomy   . History of kidney stones   . Hx of adenomatous colonic polyps 2005  . Hyperlipidemia   . Hypertension   . Hypothyroidism   . Knee pain   . Non-obstructive CAD    a. 08/2015 Cath: LM 40, LCX small, 30ost, 77m, OM2 small, RCA nl/small, EF 55-65%.  . Osteopenia   . PONV (postoperative nausea and vomiting)   . Sciatica     Past Surgical History:  Procedure Laterality Date  . ABDOMINAL HYSTERECTOMY  1994   TAH- DUB, BSO  . [place] bunionectomy     bilateral  . BREAST BIOPSY Left 1989  . BREAST LUMPECTOMY    . BUNIONECTOMY Left 2005  . CARDIAC CATHETERIZATION  2010   +CAD  . CARDIAC CATHETERIZATION N/A 08/19/2015   Procedure: Left Heart Cath and Coronary  Angiography;  Surgeon: Belva Crome, MD;  Location: [place] [facility];  Service: Cardiovascular;  Laterality: N/A;  . COLONOSCOPY  01/29/2007   FYB:[id number] rectum, left-sided diverticula, 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  Remainder of colonic mucosa was normal  . COLONOSCOPY WITH ESOPHAGOGASTRODUODENOSCOPY (EGD) N/A 02/01/2014   CHE:NIDP erosive reflux/gastric polyp/normal rectum/scattered left sided diverticula, normal distal TI. gastric bx negative, fundic gland polyp. next TCS 01/2019.  Marland Kitchen CYSTECTOMY     pilonidial cyst  . ESOPHAGOGASTRODUODENOSCOPY     followed by colonoscopy with snare polypectomy  . EYE SURGERY Bilateral    cataract surgery with lens implants  . REVISION TOTAL HIP ARTHROPLASTY Right   . right knee replacement  02/2013  . SHOULDER ARTHROSCOPY Left 2005  . SHOULDER ARTHROSCOPY Right   . SPINAL FUSION    . SPINAL FUSION  2004   L4-L5  . TOTAL HIP ARTHROPLASTY Right 06/2006  . TOTAL HIP ARTHROPLASTY  8/12  . TOTAL KNEE ARTHROPLASTY Left 04/18/2020  . TOTAL KNEE ARTHROPLASTY Left 04/18/2020   Procedure: LEFT TOTAL KNEE ARTHROPLASTY;  Surgeon: Leandrew Koyanagi, MD;  Location: [place];  Service:  Orthopedics;  Laterality: Left;  . TOTAL SHOULDER REPLACEMENT Left 08/2005    There were no vitals filed for this visit.   Subjective Assessment - 05/30/20 1533    Subjective Patient says she is doing well. Knee is aching, about a 3/10 today. Has been walking well with cane, does not use it around the house.    Patient Stated Goals Be able to walk, be more balanced    Currently in Pain? Yes    Pain Score 3     Pain Location Knee    Pain Orientation Left    Pain Descriptors / Indicators Aching    Pain Type Surgical pain    Pain Onset 1 to 4 weeks ago    Pain Frequency Constant                             [facility] Adult PT Treatment/Exercise - 05/30/20 0001      Knee/Hip Exercises: Stretches   Gastroc Stretch Both;3 reps;30 seconds     Gastroc Stretch Limitations slant board      Knee/Hip Exercises: Aerobic   Stationary Bike seat  at position 4 5 minutes       Knee/Hip Exercises: Standing   Heel Raises 20 reps    Knee Flexion Both;1 set;15 reps    Hip Abduction Both;1 set;15 reps    Lateral Step Up Left;Hand Hold: 2;Step Height: 6";1 set;15 reps    Forward Step Up Left;Step Height: 6";Hand Hold: 1;1 set;15 reps    Gait Training 226 feet with no AD    Other Standing Knee Exercises tandem stance 30" x2 each LE lead      Knee/Hip Exercises: Seated   Sit to Sand 1 set;10 reps;with UE support      Knee/Hip Exercises: Supine   Terminal Knee Extension Left;1 set;15 reps;Theraband    Theraband Level (Terminal Knee Extension) Other (comment)    Terminal Knee Extension Limitations purple    Knee Extension Left;AROM    Knee Extension Limitations 2    Knee Flexion Left;AROM    Knee Flexion Limitations 122    Other Supine Knee/Hip Exercises heel prop 1'      Manual Therapy   Manual Therapy Soft tissue mobilization;Myofascial release    Manual therapy comments completed separate rest of treatment    Soft tissue mobilization STM to LT distal quad    Myofascial Release scar tissue message                     PT Short Term Goals - 05/11/20 1028      PT SHORT TERM GOAL #1   Title Patient will be independent with initial HEP and self-management strategies to improve functional outcomes    Time 3    Period Weeks    Status New    Target Date 06/03/20      PT SHORT TERM GOAL #2   Title Patient will report at least 25% overall improvement in subjective complaint to indicate improvement in ability to perform ADLs.    Time 3    Period Weeks    Status New    Target Date 06/03/20             PT Long Term Goals - 05/11/20 1029      PT LONG TERM GOAL #1   Title Patient will be able to ambulate at least 325 feet during 2MWT with LRAD to demonstrate improved ability to  perform functional mobility and  associated tasks.    Time 6    Period Weeks    Status New    Target Date 06/24/20      PT LONG TERM GOAL #2   Title Patient will report at least 75% overall improvement in subjective complaint to indicate improvement in ability to perform ADLs.    Time 6    Period Weeks    Status New    Target Date 06/24/20      PT LONG TERM GOAL #3   Title Patient will improve FOTO score by 10 % to indicate improvement in functional outcomes    Time 6    Period Weeks    Status New    Target Date 06/24/20      PT LONG TERM GOAL #4   Title Patient will have LT knee AROM 0-120 degrees to improve functional mobility and facilitate squatting to pick up items from floor.    Time 6    Period Weeks    Status New    Target Date 06/24/20                 Plan - 05/30/20 1618    Clinical Impression Statement Patient tolerated session well overall today. Patient did note some discomfort in RT knee during sit to stands, but subsided with rest. Patient progressing well toward therapy goals and shows improved static balance and gait today. Progress LE strengthening with band TKEs for improved quad control during stairs. Patient educated on proper form and function. Patient will continue to benefit from skilled therapy services to progress knee mobility and strength to reduce pain levels and improve functional mobility.    Comorbidities fibromyalgia, HTN, arthritis    Examination-Activity Limitations Locomotion Level;Transfers;Stand;Stairs;Squat;Bend    Examination-Participation Restrictions Laundry;Community Activity;Cleaning    Stability/Clinical Decision Making Stable/Uncomplicated    Rehab Potential Good    PT Frequency 3x / week    PT Duration 6 weeks    PT Treatment/Interventions ADLs/Self Care Home Management;Aquatic Therapy;Biofeedback;Cryotherapy;Electrical Stimulation;Iontophoresis 4mg /ml Dexamethasone;Moist Heat;Traction;Balance training;Manual lymph drainage;Manual techniques;Therapeutic  exercise;Therapeutic activities;Functional mobility training;Splinting;Taping;Vasopneumatic Device;Vestibular;Energy conservation;Dry needling;Gait training;Orthotic Fit/Training;Stair training;DME Instruction;Patient/family education;Cognitive remediation;Contrast Bath;Scar mobilization;Passive range of motion;Spinal Manipulations;Joint Manipulations;Compression bandaging;Neuromuscular re-education;Ultrasound;Parrafin;Fluidtherapy    PT Next Visit Plan Progress functional strengthening exercises and balance as tolerated. Add step downs next visit    PT Home Exercise Plan 05/11/20: quad set, heel slide, SLR 05/17/20: heel raise, sit to stand; 05/25/20: SLS and tandem stance by counter    Consulted and Agree with Plan of Care Patient           Patient will benefit from skilled therapeutic intervention in order to improve the following deficits and impairments:  Abnormal gait, Decreased endurance, Hypomobility, Increased edema, Decreased scar mobility, Decreased activity tolerance, Decreased balance, Decreased mobility, Difficulty walking, Decreased strength, Pain, Impaired flexibility, Decreased range of motion  Visit Diagnosis: Other abnormalities of gait and mobility  Stiffness of left knee, not elsewhere classified  Left knee pain, unspecified chronicity     Problem List Patient Active Problem List   Diagnosis Date Noted  . Status post total left knee replacement 04/18/2020  . Primary osteoarthritis of left knee 04/17/2020  . Scalp laceration 03/01/2020  . Abdominal pain, epigastric 08/14/2019  . Midsternal chest pain 08/19/2015  . Bipolar disorder ([place])   . Hypothyroidism 08/04/2015    Class: Diagnosis of  . Chronic cough 11/23/2014  . Fecal incontinence 11/23/2014  . Constipation 09/22/2013  . H/O adenomatous polyp of colon 09/22/2013  .  Muscle weakness (generalized) 04/27/2013  . Chronic fatigue 03/05/2013  . Chronic pain syndrome 03/05/2013  . Vitamin D deficiency 03/05/2013    . Insomnia 03/05/2013  . Hot flashes 03/05/2013  . History of gastroesophageal reflux (GERD) 03/05/2013  . History of back surgery 03/05/2013  . H/O anxiety disorder 03/05/2013  . HTN (hypertension) 10/15/2011  . Difficulty in walking(719.7) 07/26/2011  . Hyperlipidemia 05/11/2009  . BIPOLAR AFFECTIVE DISORDER 05/11/2009  . Coronary artery disease 05/11/2009  . GERD 05/11/2009  . Fibromyalgia 05/11/2009  . DIZZINESS 05/11/2009  . Chest pain 05/11/2009   4:23 PM, 05/30/20 Josue Hector PT DPT  Physical Therapist with [facility]  [phone]   [facility] [address] [place], [place], [address] Phone: [phone]   Fax:  [phone]  Name: [name] [name] MRN: [id number] Date of Birth: [birth year]

## 2020-06-01 ENCOUNTER — Encounter: Payer: Self-pay | Admitting: Family Medicine

## 2020-06-01 ENCOUNTER — Other Ambulatory Visit: Payer: Self-pay | Admitting: Internal Medicine

## 2020-06-01 ENCOUNTER — Ambulatory Visit (HOSPITAL_COMMUNITY): Payer: Medicare PPO | Admitting: Physical Therapy

## 2020-06-01 ENCOUNTER — Other Ambulatory Visit: Payer: Self-pay

## 2020-06-01 ENCOUNTER — Ambulatory Visit: Payer: Medicare PPO | Admitting: Family Medicine

## 2020-06-01 ENCOUNTER — Encounter (HOSPITAL_COMMUNITY): Payer: Self-pay | Admitting: Physical Therapy

## 2020-06-01 VITALS — BP 147/88 | HR 100 | Resp 16 | Ht <= 58 in | Wt 119.1 lb

## 2020-06-01 DIAGNOSIS — M25562 Pain in left knee: Secondary | ICD-10-CM

## 2020-06-01 DIAGNOSIS — R2689 Other abnormalities of gait and mobility: Secondary | ICD-10-CM | POA: Diagnosis not present

## 2020-06-01 DIAGNOSIS — F310 Bipolar disorder, current episode hypomanic: Secondary | ICD-10-CM

## 2020-06-01 DIAGNOSIS — Z96652 Presence of left artificial knee joint: Secondary | ICD-10-CM

## 2020-06-01 DIAGNOSIS — E782 Mixed hyperlipidemia: Secondary | ICD-10-CM

## 2020-06-01 DIAGNOSIS — I10 Essential (primary) hypertension: Secondary | ICD-10-CM

## 2020-06-01 DIAGNOSIS — M25662 Stiffness of left knee, not elsewhere classified: Secondary | ICD-10-CM

## 2020-06-01 DIAGNOSIS — E559 Vitamin D deficiency, unspecified: Secondary | ICD-10-CM

## 2020-06-01 DIAGNOSIS — E039 Hypothyroidism, unspecified: Secondary | ICD-10-CM | POA: Diagnosis not present

## 2020-06-01 NOTE — Assessment & Plan Note (Signed)
Doing very well with this working with physical therapy.  Has cane present.

## 2020-06-01 NOTE — Therapy (Signed)
[place], [place], [address] Phone: [phone]   Fax:  [phone]  Physical Therapy Treatment  Patient Details  Name: [name] [name] MRN: [id number] Date of Birth: 12/10/1940 Referring Provider (PT): Smitty Knudsen MD    Encounter Date: 06/01/2020   PT End of Session - 06/01/20 1623    Visit Number 8    Number of Visits 18    Date for PT Re-Evaluation 06/24/20    Authorization Type [employer]    Authorization Time Period 05/11/20-06/24/20    Authorization - Visit Number 7    Authorization - Number of Visits 12    Progress Note Due on Visit 10    PT Start Time 1520    PT Stop Time 1600    PT Time Calculation (min) 40 min    Equipment Utilized During Treatment --    Activity Tolerance Patient tolerated treatment well    Behavior During Therapy [facility] for tasks assessed/performed           Past Medical History:  Diagnosis Date  . Anemia   . Anxiety   . Arthritis   . Bipolar affective disorder ([place])   . Cataract   . Chest pain 05/11/2009   Qualifier: Diagnosis of  By: Orville Govern CMA, Arbie Cookey    . Chronic back pain   . Chronic cough 11/23/2014  . Chronic fatigue   . Complication of anesthesia   . Constipation   . Depression   . Difficulty in walking(719.7) 07/26/2011  . Dizziness   . Dyslipidemia   . Fecal incontinence 11/23/2014  . Fibromyalgia   . GERD (gastroesophageal reflux disease)   . H/O anxiety disorder 03/05/2013   Overview:  Heart Cath x 2 per patient; most recent 4 yrs ago with "negative" findings per patient; Cardiologist, Dr Dorris Carnes following; last seen 10/2011; most recent episode 11/2012 with admit [facility]) and negative work-up per patient   . H/O: hysterectomy   . History of gastroesophageal reflux (GERD) 03/05/2013  . History of kidney stones   . Hx of adenomatous colonic polyps 2005  . Hyperlipidemia   . Hypertension   . Hypothyroidism   . Knee pain   . Midsternal chest  pain 08/19/2015  . Non-obstructive CAD    a. 08/2015 Cath: LM 40, LCX small, 30ost, 38m, OM2 small, RCA nl/small, EF 55-65%.  . Osteopenia   . PONV (postoperative nausea and vomiting)   . Primary osteoarthritis of left knee 04/17/2020  . Scalp laceration 03/01/2020  . Sciatica     Past Surgical History:  Procedure Laterality Date  . ABDOMINAL HYSTERECTOMY  1994   TAH- DUB, BSO  . [place] bunionectomy     bilateral  . BREAST BIOPSY Left 1989  . BREAST LUMPECTOMY    . BUNIONECTOMY Left 2005  . CARDIAC CATHETERIZATION  2010   +CAD  . CARDIAC CATHETERIZATION N/A 08/19/2015   Procedure: Left Heart Cath and Coronary Angiography;  Surgeon: Belva Crome, MD;  Location: [place] [facility];  Service: Cardiovascular;  Laterality: N/A;  . COLONOSCOPY  01/29/2007   VOZ:DGUYQI rectum, left-sided diverticula, 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  Remainder of colonic mucosa was normal  . COLONOSCOPY WITH ESOPHAGOGASTRODUODENOSCOPY (EGD) N/A 02/01/2014   HKV:QQVZ erosive reflux/gastric polyp/normal rectum/scattered left sided diverticula, normal distal TI. gastric bx negative, fundic gland polyp. next TCS 01/2019.  Marland Kitchen CYSTECTOMY     pilonidial cyst  . ESOPHAGOGASTRODUODENOSCOPY     followed  by colonoscopy with snare polypectomy  . EYE SURGERY Bilateral    cataract surgery with lens implants  . REVISION TOTAL HIP ARTHROPLASTY Right   . right knee replacement  02/2013  . SHOULDER ARTHROSCOPY Left 2005  . SHOULDER ARTHROSCOPY Right   . SPINAL FUSION    . SPINAL FUSION  2004   L4-L5  . TOTAL HIP ARTHROPLASTY Right 06/2006  . TOTAL HIP ARTHROPLASTY  8/12  . TOTAL KNEE ARTHROPLASTY Left 04/18/2020  . TOTAL KNEE ARTHROPLASTY Left 04/18/2020   Procedure: LEFT TOTAL KNEE ARTHROPLASTY;  Surgeon: Leandrew Koyanagi, MD;  Location: [facility];  Service: Orthopedics;  Laterality: Left;  . TOTAL SHOULDER REPLACEMENT Left 08/2005    There were no vitals filed for this visit.   Subjective Assessment  - 06/01/20 1621    Subjective Patient reports no new issues says knee pain is about a 3 today.    Patient Stated Goals Be able to walk, be more balanced    Currently in Pain? Yes    Pain Score 3     Pain Location Knee    Pain Orientation Left    Pain Descriptors / Indicators Aching    Pain Type Surgical pain    Pain Onset 1 to 4 weeks ago                             [facility] Adult PT Treatment/Exercise - 06/01/20 0001      Knee/Hip Exercises: Stretches   Gastroc Stretch Both;3 reps;30 seconds    Gastroc Stretch Limitations slant board      Knee/Hip Exercises: Aerobic   Recumbent Bike 5 min dynamic warmup seat 5 Lv 1      Knee/Hip Exercises: Standing   Heel Raises 20 reps    Knee Flexion Left;2 sets;10 reps    Hip Abduction Both;2 sets;10 reps    Stairs 5RT 7 inch step reciprocal gait with single hand rail     Gait Training 240 feet with no AD    Other Standing Knee Exercises tandem stance 30" x2 each LE lead on foam with intermittent HHA      Knee/Hip Exercises: Supine   Terminal Knee Extension Left;1 set;15 reps;Theraband    Theraband Level (Terminal Knee Extension) Other (comment)    Terminal Knee Extension Limitations purple    Knee Extension Left;AROM    Knee Extension Limitations 2    Knee Flexion Left;AROM    Knee Flexion Limitations 125      Manual Therapy   Manual Therapy Soft tissue mobilization;Myofascial release    Manual therapy comments completed separate rest of treatment    Soft tissue mobilization STM to LT distal quad    Myofascial Release scar tissue message                     PT Short Term Goals - 05/11/20 1028      PT SHORT TERM GOAL #1   Title Patient will be independent with initial HEP and self-management strategies to improve functional outcomes    Time 3    Period Weeks    Status New    Target Date 06/03/20      PT SHORT TERM GOAL #2   Title Patient will report at least 25% overall improvement in subjective  complaint to indicate improvement in ability to perform ADLs.    Time 3    Period Weeks    Status New  Target Date 06/03/20             PT Long Term Goals - 05/11/20 1029      PT LONG TERM GOAL #1   Title Patient will be able to ambulate at least 325 feet during 2MWT with LRAD to demonstrate improved ability to perform functional mobility and associated tasks.    Time 6    Period Weeks    Status New    Target Date 06/24/20      PT LONG TERM GOAL #2   Title Patient will report at least 75% overall improvement in subjective complaint to indicate improvement in ability to perform ADLs.    Time 6    Period Weeks    Status New    Target Date 06/24/20      PT LONG TERM GOAL #3   Title Patient will improve FOTO score by 10 % to indicate improvement in functional outcomes    Time 6    Period Weeks    Status New    Target Date 06/24/20      PT LONG TERM GOAL #4   Title Patient will have LT knee AROM 0-120 degrees to improve functional mobility and facilitate squatting to pick up items from floor.    Time 6    Period Weeks    Status New    Target Date 06/24/20                 Plan - 06/01/20 1623    Clinical Impression Statement Patient progressing well toward therapy goals. Patient able to progress to stair ambulation on 7 inch step using single hand rail with reciprocal gait with good control. Patient still limited by fatigue with functional activity and requires several breaks for rest. Patient well challenged with added foam pad to tandem stance    Comorbidities fibromyalgia, HTN, arthritis    Examination-Activity Limitations Locomotion Level;Transfers;Stand;Stairs;Squat;Bend    Examination-Participation Restrictions Laundry;Community Activity;Cleaning    Stability/Clinical Decision Making Stable/Uncomplicated    Rehab Potential Good    PT Frequency 3x / week    PT Duration 6 weeks    PT Treatment/Interventions ADLs/Self Care Home Management;Aquatic  Therapy;Biofeedback;Cryotherapy;Electrical Stimulation;Iontophoresis 4mg /ml Dexamethasone;Moist Heat;Traction;Balance training;Manual lymph drainage;Manual techniques;Therapeutic exercise;Therapeutic activities;Functional mobility training;Splinting;Taping;Vasopneumatic Device;Vestibular;Energy conservation;Dry needling;Gait training;Orthotic Fit/Training;Stair training;DME Instruction;Patient/family education;Cognitive remediation;Contrast Bath;Scar mobilization;Passive range of motion;Spinal Manipulations;Joint Manipulations;Compression bandaging;Neuromuscular re-education;Ultrasound;Parrafin;Fluidtherapy    PT Next Visit Plan Progress functional strengthening exercises and balance as tolerated.    PT Home Exercise Plan 05/11/20: quad set, heel slide, SLR 05/17/20: heel raise, sit to stand; 05/25/20: SLS and tandem stance by counter    Consulted and Agree with Plan of Care Patient           Patient will benefit from skilled therapeutic intervention in order to improve the following deficits and impairments:  Abnormal gait, Decreased endurance, Hypomobility, Increased edema, Decreased scar mobility, Decreased activity tolerance, Decreased balance, Decreased mobility, Difficulty walking, Decreased strength, Pain, Impaired flexibility, Decreased range of motion  Visit Diagnosis: Other abnormalities of gait and mobility  Stiffness of left knee, not elsewhere classified  Left knee pain, unspecified chronicity     Problem List Patient Active Problem List   Diagnosis Date Noted  . Status post total left knee replacement 04/18/2020  . Abdominal pain, epigastric 08/14/2019  . Bipolar disorder ([place])   . Hypothyroidism 08/04/2015    Class: Diagnosis of  . Constipation 09/22/2013  . H/O adenomatous polyp of colon 09/22/2013  . Muscle weakness (generalized) 04/27/2013  .  Chronic fatigue 03/05/2013  . Chronic pain syndrome 03/05/2013  . Vitamin D deficiency 03/05/2013  . Insomnia 03/05/2013  .  History of back surgery 03/05/2013  . HTN (hypertension) 10/15/2011  . Hyperlipidemia 05/11/2009  . BIPOLAR AFFECTIVE DISORDER 05/11/2009  . Coronary artery disease 05/11/2009  . GERD 05/11/2009  . Fibromyalgia 05/11/2009    4:32 PM, 06/01/20 Josue Hector PT DPT  Physical Therapist with [facility]  [phone] [place] [address] [place], [place], [address] Phone: [phone]   Fax:  [phone]  Name: AZORA BONZO MRN: [id number] Date of Birth: 10-Nov-1941

## 2020-06-01 NOTE — Assessment & Plan Note (Signed)
Updated labs ordered.

## 2020-06-01 NOTE — Patient Instructions (Signed)
I appreciate the opportunity to provide you with care for your health and wellness. Today we discussed: established care  Follow up: Oct for breast check   Labs at [place] today No referrals today  Great to meet you today!   Please continue to practice social distancing to keep you, your family, and our community safe.  If you must go out, please wear a mask and practice good handwashing.  It was a pleasure to see you and I look forward to continuing to work together on your health and well-being. Please do not hesitate to call the office if you need care or have questions about your care.  Have a wonderful day and week. With Gratitude, Cherly Beach, DNP, AGNP-BC

## 2020-06-01 NOTE — Assessment & Plan Note (Signed)
Needs to get updated labs.  Ordered today.  No signs or symptoms of hypothyroidism today in the office.

## 2020-06-01 NOTE — Progress Notes (Signed)
Subjective:  Patient ID: [name] [name], female    DOB: 25-Sep-1941  Age: 79 y.o. MRN: [id number]  CC:  Chief Complaint  Patient presents with  . New Patient (Initial Visit)    establish care      HPI  HPI   [name] is a 79 year old female patient who presents today to establish care.  Previously a patient of Dr. Luan Pulling.  Was seen by Dr. Holly Bodily a few times.  She reports she is due for labs.  Overall is doing well.  Has a significant history that includes but is not limited to arthritis, chronic pain, depression, dyslipidemia, hyperlipidemia, hypothyroidism, hypertension.  She also is osteopenic.  And is due for a bone density scan.  She is up-to-date on her mammogram.  She is up-to-date on her colonoscopy.  But needs to have a repeat of this.  Per her.  She received all of her vaccines.  Including Covid.  She denies having any cough fevers chills chest pain shortness of breath, headaches, leg swelling, dizziness or vision changes.  She denies having any trouble sleeping.  She is a Pharmacist, community regularly.  Has started having trouble swallowing her medications because she tries to take them all at once.  Reports some constipation and rotation of diarrhea.  She reports that sometimes it is related to medication she takes.  Secondary to recent change after having total knee replacement in June of this year.  She denies have any blood in urine or stool.  Denies having any memory issues.  Denies having any falls does have cane present.  Denies having any skin issues outside of being dry.  Does wear glasses and sees an eye doctor regularly.  No changes recently.   Today patient denies signs and symptoms of COVID 19 infection including fever, chills, cough, shortness of breath, and headache. Past Medical, Surgical, Social History, Allergies, and Medications have been Reviewed.   Past Medical History:  Diagnosis Date  . Anemia   . Anxiety   . Arthritis   . BIPOLAR AFFECTIVE DISORDER  05/11/2009   Qualifier: Diagnosis of  By: Orville Govern CMA, Arbie Cookey    . Bipolar affective disorder ([place])   . Bipolar disorder ([place])   . Cataract   . Chest pain 05/11/2009   Qualifier: Diagnosis of  By: Orville Govern CMA, Arbie Cookey    . Chronic back pain   . Chronic cough 11/23/2014  . Chronic fatigue   . Chronic pain syndrome 03/05/2013  . Complication of anesthesia   . Constipation   . Depression   . Difficulty in walking(719.7) 07/26/2011  . Dizziness   . Dyslipidemia   . Fecal incontinence 11/23/2014  . Fibromyalgia   . GERD (gastroesophageal reflux disease)   . H/O anxiety disorder 03/05/2013   Overview:  Heart Cath x 2 per patient; most recent 4 yrs ago with "negative" findings per patient; Cardiologist, Dr Dorris Carnes following; last seen 10/2011; most recent episode 11/2012 with admit Forestine Na [facility]) and negative work-up per patient   . H/O: hysterectomy   . History of back surgery 03/05/2013  . History of gastroesophageal reflux (GERD) 03/05/2013  . History of kidney stones   . Hx of adenomatous colonic polyps 2005  . Hyperlipidemia   . Hypertension   . Hypothyroidism   . Knee pain   . Midsternal chest pain 08/19/2015  . Muscle weakness (generalized) 04/27/2013  . Non-obstructive CAD    a. 08/2015 Cath: LM 40, LCX small, 30ost, 69m, OM2 small,  RCA nl/small, EF 55-65%.  . Osteopenia   . PONV (postoperative nausea and vomiting)   . Primary osteoarthritis of left knee 04/17/2020  . Scalp laceration 03/01/2020  . Sciatica     Current Meds  Medication Sig  . ALPRAZolam (XANAX) 0.5 MG tablet Take 0.25-0.5 mg by mouth daily as needed for anxiety.   Marland Kitchen aspirin EC 81 MG tablet Take 1 tablet (81 mg total) by mouth 2 (two) times daily.  Marland Kitchen atorvastatin (LIPITOR) 40 MG tablet TAKE (1) TABLET BY MOUTH AT BEDTIME. (Patient taking differently: Take 40 mg by mouth daily. TAKE (1) TABLET BY MOUTH AT BEDTIME.)  . bisacodyl (WOMENS LAXATIVE) 5 MG EC tablet Take 1 tablet by mouth daily as needed for mild  constipation (constipation).   . Calcium Carb-Cholecalciferol (CALCIUM/VITAMIN D PO) Take 1 tablet by mouth daily.  . Cholecalciferol (VITAMIN D) 50 MCG (2000 UT) CAPS Take 2,000 Units by mouth daily.  . Cimetidine (TAGAMET PO) Take 1 tablet by mouth daily.   . DULoxetine (CYMBALTA) 60 MG capsule Take 120 mg by mouth daily.   Marland Kitchen estradiol (ESTRACE) 1 MG tablet TAKE 1/2 TABLET BY MOUTH DAILY. (Patient taking differently: Take 0.5 mg by mouth daily. )  . lamoTRIgine (LAMICTAL) 150 MG tablet Take 150 mg by mouth at bedtime.  Marland Kitchen levothyroxine (SYNTHROID) 25 MCG tablet Take 1 tablet (25 mcg total) by mouth daily. (Patient taking differently: Take 25 mcg by mouth daily. Take at [facility])  . meloxicam (MOBIC) 7.5 MG tablet Take 1 tablet (7.5 mg total) by mouth 2 (two) times daily.  . methocarbamol (ROBAXIN) 500 MG tablet Take 1 tablet (500 mg total) by mouth every 6 (six) hours as needed for muscle spasms.  . Multiple Vitamin (MULTIVITAMIN) tablet Take 1 tablet by mouth daily.    . niacin (SLO-NIACIN) 500 MG tablet Take 500 mg by mouth daily.   . nitroGLYCERIN (NITROSTAT) 0.4 MG SL tablet PLACE 1 TAB UNDER TONGUE EVERY 5 MIN IF NEEDED FOR CHEST PAIN. MAY USE 3 TIMES.NO RELIEF CALL 911. (Patient taking differently: Place 0.4 mg under the tongue every 5 (five) minutes as needed for chest pain. PLACE 1 TAB UNDER TONGUE EVERY 5 MIN IF NEEDED FOR CHEST PAIN. MAY USE 3 TIMES.NO RELIEF CALL 911.)  . Omega-3 Fatty Acids (FISH OIL) 1000 MG CAPS Take 1,000 mg by mouth daily.   Marland Kitchen oxyCODONE-acetaminophen (PERCOCET) 5-325 MG tablet Take 1-2 tablets by mouth every 8 (eight) hours as needed for severe pain.  . Probiotic Product (PROBIOTIC DAILY PO) Take 1 tablet by mouth daily.   . risperiDONE (RISPERDAL) 1 MG tablet Take 0.5 mg by mouth at bedtime.   . WELLBUTRIN XL 300 MG 24 hr tablet Take 300 mg by mouth daily.   . [DISCONTINUED] losartan (COZAAR) 50 MG tablet Take 1 tablet (50 mg total) by mouth daily.    ROS:  Review  of Systems  Constitutional: Negative.   HENT: Negative.   Eyes: Negative.   Respiratory: Negative.   Cardiovascular: Negative.   Gastrointestinal: Negative.   Genitourinary: Negative.   Musculoskeletal: Negative.   Skin: Negative.   Neurological: Negative.   Endo/Heme/Allergies: Negative.   Psychiatric/Behavioral: Negative.   All other systems reviewed and are negative.    Objective:   Today's Vitals: BP (!) 147/88   Pulse 100   Resp 16   Ht 4' 8.5" (1.435 m)   Wt 119 lb 1.9 oz (54 kg)   LMP 02/10/1993   SpO2 96%   BMI 26.24  kg/m  Vitals with BMI 06/01/2020 04/21/2020 04/21/2020  Height 4' 8.5" - -  Weight 119 lbs 2 oz - -  BMI 24.46 - -  Systolic 286 381 99  Diastolic 88 68 72  Pulse 771 101 96     Physical Exam Vitals and nursing note reviewed.  Constitutional:      Appearance: Normal appearance. She is well-developed and well-groomed. She is obese.  HENT:     Head: Normocephalic and atraumatic.     Right Ear: External ear normal.     Left Ear: External ear normal.     Mouth/Throat:     Comments: Mask in place Eyes:     General:        Right eye: No discharge.        Left eye: No discharge.     Conjunctiva/sclera: Conjunctivae normal.  Cardiovascular:     Rate and Rhythm: Normal rate and regular rhythm.     Pulses: Normal pulses.     Heart sounds: Normal heart sounds.  Pulmonary:     Effort: Pulmonary effort is normal.     Breath sounds: Normal breath sounds.  Musculoskeletal:        General: Normal range of motion.     Cervical back: Normal range of motion and neck supple.  Skin:    General: Skin is warm.  Neurological:     General: No focal deficit present.     Mental Status: She is alert and oriented to person, place, and time.  Psychiatric:        Attention and Perception: Attention normal.        Mood and Affect: Mood normal.        Speech: Speech normal.        Behavior: Behavior normal. Behavior is cooperative.        Thought Content:  Thought content normal.        Cognition and Memory: Cognition normal.        Judgment: Judgment normal.      Assessment   1. Mixed hyperlipidemia   2. Essential hypertension   3. Vitamin D deficiency   4. Hypothyroidism, unspecified type   5. Status post total left knee replacement   6. Bipolar affective disorder, current episode hypomanic ([place])     Tests ordered Orders Placed This Encounter  Procedures  . CBC  . VITAMIN D 25 Hydroxy (Vit-D Deficiency, Fractures)  . TSH     Plan: Please see assessment and plan per problem list above.   No orders of the defined types were placed in this encounter.   Patient to follow-up in 08/31/2020   Perlie Mayo, NP

## 2020-06-01 NOTE — Assessment & Plan Note (Signed)
Heart healthy low-fat diet encouraged.

## 2020-06-01 NOTE — Assessment & Plan Note (Signed)
Norman Herrlich is encouraged to maintain a well balanced diet that is low in salt. Higher end of control controlled, continue current medication regimen.  Additionally, she is also reminded that exercise as tolerated is beneficial for heart health and control of Blood pressure. 30-60 minutes daily is recommended-walking was suggested.

## 2020-06-01 NOTE — Assessment & Plan Note (Signed)
Is managed by outside provider.  Reports doing well not having any issues at this time.  Continue medications as directed by outside provider.

## 2020-06-03 ENCOUNTER — Telehealth (HOSPITAL_COMMUNITY): Payer: Self-pay

## 2020-06-03 ENCOUNTER — Encounter (HOSPITAL_COMMUNITY): Payer: Medicare PPO

## 2020-06-03 NOTE — Telephone Encounter (Signed)
pt cancelled appt for today because she started to feel sick and vomiting

## 2020-06-03 NOTE — Telephone Encounter (Signed)
No Show#1. Therapist called pt regarding missed appointment today at 3:15. Unable to reach pt so left voicemail regarding missed appointment, wishing pt well, informed pt of next appointment and left clinic phone number for questions or needs to reschedule next appointment.  Talbot Grumbling PT, DPT 06/03/20, 3:34 PM [phone]

## 2020-06-06 ENCOUNTER — Other Ambulatory Visit: Payer: Self-pay

## 2020-06-06 ENCOUNTER — Ambulatory Visit (HOSPITAL_COMMUNITY): Payer: Medicare PPO | Admitting: Physical Therapy

## 2020-06-06 DIAGNOSIS — M25662 Stiffness of left knee, not elsewhere classified: Secondary | ICD-10-CM

## 2020-06-06 DIAGNOSIS — R2689 Other abnormalities of gait and mobility: Secondary | ICD-10-CM | POA: Diagnosis not present

## 2020-06-06 DIAGNOSIS — M25562 Pain in left knee: Secondary | ICD-10-CM | POA: Diagnosis not present

## 2020-06-06 NOTE — Therapy (Signed)
[place], [place], [address] Phone: [phone]   Fax:  [phone]  Physical Therapy Treatment  Patient Details  Name: [name] [name] MRN: [id number] Date of Birth: 1941-02-17 Referring Provider (PT): Smitty Knudsen MD    Encounter Date: 06/06/2020   PT End of Session - 06/06/20 1444    Visit Number 9    Number of Visits 18    Date for PT Re-Evaluation 06/24/20    Authorization Type [employer]    Authorization Time Period 05/11/20-06/24/20    Authorization - Visit Number 9    Authorization - Number of Visits 12    Progress Note Due on Visit 10    PT Start Time 7124    PT Stop Time 1445    PT Time Calculation (min) 40 min    Activity Tolerance Patient tolerated treatment well    Behavior During Therapy [facility] for tasks assessed/performed           Past Medical History:  Diagnosis Date  . Anemia   . Anxiety   . Arthritis   . BIPOLAR AFFECTIVE DISORDER 05/11/2009   Qualifier: Diagnosis of  By: Orville Govern CMA, Arbie Cookey    . Bipolar affective disorder ([place])   . Bipolar disorder ([place])   . Cataract   . Chest pain 05/11/2009   Qualifier: Diagnosis of  By: Orville Govern CMA, Arbie Cookey    . Chronic back pain   . Chronic cough 11/23/2014  . Chronic fatigue   . Chronic pain syndrome 03/05/2013  . Complication of anesthesia   . Constipation   . Depression   . Difficulty in walking(719.7) 07/26/2011  . Dizziness   . Dyslipidemia   . Fecal incontinence 11/23/2014  . Fibromyalgia   . GERD (gastroesophageal reflux disease)   . H/O anxiety disorder 03/05/2013   Overview:  Heart Cath x 2 per patient; most recent 4 yrs ago with "negative" findings per patient; Cardiologist, Dr Dorris Carnes following; last seen 10/2011; most recent episode 11/2012 with admit Forestine Na [facility]) and negative work-up per patient   . H/O: hysterectomy   . History of back surgery 03/05/2013  . History of gastroesophageal reflux (GERD) 03/05/2013  . History  of kidney stones   . Hx of adenomatous colonic polyps 2005  . Hyperlipidemia   . Hypertension   . Hypothyroidism   . Knee pain   . Midsternal chest pain 08/19/2015  . Muscle weakness (generalized) 04/27/2013  . Non-obstructive CAD    a. 08/2015 Cath: LM 40, LCX small, 30ost, 53m, OM2 small, RCA nl/small, EF 55-65%.  . Osteopenia   . PONV (postoperative nausea and vomiting)   . Primary osteoarthritis of left knee 04/17/2020  . Scalp laceration 03/01/2020  . Sciatica     Past Surgical History:  Procedure Laterality Date  . ABDOMINAL HYSTERECTOMY  1994   TAH- DUB, BSO  . [place] bunionectomy     bilateral  . BREAST BIOPSY Left 1989  . BREAST LUMPECTOMY    . BUNIONECTOMY Left 2005  . CARDIAC CATHETERIZATION  2010   +CAD  . CARDIAC CATHETERIZATION N/A 08/19/2015   Procedure: Left Heart Cath and Coronary Angiography;  Surgeon: Belva Crome, MD;  Location: [facility];  Service: Cardiovascular;  Laterality: N/A;  . 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  Remainder of colonic mucosa was normal  . COLONOSCOPY WITH ESOPHAGOGASTRODUODENOSCOPY (EGD) N/A 02/01/2014   SNK:NLZJ erosive  reflux/gastric polyp/normal rectum/scattered left sided diverticula, normal distal TI. gastric bx negative, fundic gland polyp. next TCS 01/2019.  Marland Kitchen CYSTECTOMY     pilonidial cyst  . ESOPHAGOGASTRODUODENOSCOPY     followed by colonoscopy with snare polypectomy  . EYE SURGERY Bilateral    cataract surgery with lens implants  . REVISION TOTAL HIP ARTHROPLASTY Right   . right knee replacement  02/2013  . SHOULDER ARTHROSCOPY Left 2005  . SHOULDER ARTHROSCOPY Right   . SPINAL FUSION    . SPINAL FUSION  2004   L4-L5  . TOTAL HIP ARTHROPLASTY Right 06/2006  . TOTAL HIP ARTHROPLASTY  8/12  . TOTAL KNEE ARTHROPLASTY Left 04/18/2020  . TOTAL KNEE ARTHROPLASTY Left 04/18/2020   Procedure: LEFT TOTAL KNEE ARTHROPLASTY;  Surgeon:  Leandrew Koyanagi, MD;  Location: [facility];  Service: Orthopedics;  Laterality: Left;  . TOTAL SHOULDER REPLACEMENT Left 08/2005    There were no vitals filed for this visit.   Subjective Assessment - 06/06/20 1411    Subjective pt reports current pain of 3/10 today.                             [place] Adult PT Treatment/Exercise - 06/06/20 0001      Knee/Hip Exercises: Stretches   Gastroc Stretch Both;3 reps;30 seconds    Gastroc Stretch Limitations slant board      Knee/Hip Exercises: Aerobic   Stationary Bike seat  at position 3   5 minutes       Knee/Hip Exercises: Standing   Heel Raises 20 reps    Knee Flexion Left;20 reps    Hip Abduction Both;20 reps    Lateral Step Up Left;Hand Hold: 2;Step Height: 6";1 set;20 reps;Right    Forward Step Up Left;Step Height: 6";Hand Hold: 1;1 set;20 reps;Right    Stairs 5RT 7 inch step reciprocal gait with single hand rail     Gait Training 240 feet with no AD    Other Standing Knee Exercises tandem stance 30" x2 each LE lead on foam with intermittent HHA      Knee/Hip Exercises: Seated   Sit to Sand 1 set;10 reps;with UE support                    PT Short Term Goals - 05/11/20 1028      PT SHORT TERM GOAL #1   Title Patient will be independent with initial HEP and self-management strategies to improve functional outcomes    Time 3    Period Weeks    Status New    Target Date 06/03/20      PT SHORT TERM GOAL #2   Title Patient will report at least 25% overall improvement in subjective complaint to indicate improvement in ability to perform ADLs.    Time 3    Period Weeks    Status New    Target Date 06/03/20             PT Long Term Goals - 05/11/20 1029      PT LONG TERM GOAL #1   Title Patient will be able to ambulate at least 325 feet during 2MWT with LRAD to demonstrate improved ability to perform functional mobility and associated tasks.    Time 6    Period Weeks    Status New    Target  Date 06/24/20      PT LONG TERM GOAL #2   Title Patient will  report at least 75% overall improvement in subjective complaint to indicate improvement in ability to perform ADLs.    Time 6    Period Weeks    Status New    Target Date 06/24/20      PT LONG TERM GOAL #3   Title Patient will improve FOTO score by 10 % to indicate improvement in functional outcomes    Time 6    Period Weeks    Status New    Target Date 06/24/20      PT LONG TERM GOAL #4   Title Patient will have LT knee AROM 0-120 degrees to improve functional mobility and facilitate squatting to pick up items from floor.    Time 6    Period Weeks    Status New    Target Date 06/24/20                 Plan - 06/06/20 1701    Clinical Impression Statement contiued with focus on improving LE strength and stability.  Pt with noted weakness bilaterally and general fatigue.  Pt requires frequent rest breaks with noted challenge of step exercises.  Pt not completing therex as instructed and explained importance of this for optimal progression.  Pt verbalized understanding.    Comorbidities fibromyalgia, HTN, arthritis    Examination-Activity Limitations Locomotion Level;Transfers;Stand;Stairs;Squat;Bend    Examination-Participation Restrictions Laundry;Community Activity;Cleaning    Stability/Clinical Decision Making Stable/Uncomplicated    Rehab Potential Good    PT Frequency 3x / week    PT Duration 6 weeks    PT Treatment/Interventions ADLs/Self Care Home Management;Aquatic Therapy;Biofeedback;Cryotherapy;Electrical Stimulation;Iontophoresis 4mg /ml Dexamethasone;Moist Heat;Traction;Balance training;Manual lymph drainage;Manual techniques;Therapeutic exercise;Therapeutic activities;Functional mobility training;Splinting;Taping;Vasopneumatic Device;Vestibular;Energy conservation;Dry needling;Gait training;Orthotic Fit/Training;Stair training;DME Instruction;Patient/family education;Cognitive remediation;Contrast  Bath;Scar mobilization;Passive range of motion;Spinal Manipulations;Joint Manipulations;Compression bandaging;Neuromuscular re-education;Ultrasound;Parrafin;Fluidtherapy    PT Next Visit Plan Progress functional strengthening exercises and balance as tolerated.    PT Home Exercise Plan 05/11/20: quad set, heel slide, SLR 05/17/20: heel raise, sit to stand; 05/25/20: SLS and tandem stance by counter    Consulted and Agree with Plan of Care Patient           Patient will benefit from skilled therapeutic intervention in order to improve the following deficits and impairments:  Abnormal gait, Decreased endurance, Hypomobility, Increased edema, Decreased scar mobility, Decreased activity tolerance, Decreased balance, Decreased mobility, Difficulty walking, Decreased strength, Pain, Impaired flexibility, Decreased range of motion  Visit Diagnosis: Stiffness of left knee, not elsewhere classified  Left knee pain, unspecified chronicity  Other abnormalities of gait and mobility     Problem List Patient Active Problem List   Diagnosis Date Noted  . Status post total left knee replacement 04/18/2020  . Abdominal pain, epigastric 08/14/2019  . Bipolar disorder ([place])   . Hypothyroidism 08/04/2015    Class: Diagnosis of  . Constipation 09/22/2013  . H/O adenomatous polyp of colon 09/22/2013  . Vitamin D deficiency 03/05/2013  . Insomnia 03/05/2013  . HTN (hypertension) 10/15/2011  . Hyperlipidemia 05/11/2009  . Coronary artery disease 05/11/2009  . GERD 05/11/2009   Teena Irani, PTA/CLT [phone]  Teena Irani 06/06/2020, 5:04 PM  [facility] [address] [place], [place], [address] Phone: [phone]   Fax:  [phone]  Name: [name] [name] MRN: [id number] Date of Birth: [birth year]

## 2020-06-07 ENCOUNTER — Encounter: Payer: Self-pay | Admitting: Orthopaedic Surgery

## 2020-06-07 ENCOUNTER — Ambulatory Visit: Payer: Medicare PPO | Admitting: Orthopaedic Surgery

## 2020-06-07 ENCOUNTER — Ambulatory Visit (INDEPENDENT_AMBULATORY_CARE_PROVIDER_SITE_OTHER): Payer: Medicare PPO

## 2020-06-07 DIAGNOSIS — Z96652 Presence of left artificial knee joint: Secondary | ICD-10-CM

## 2020-06-07 NOTE — Progress Notes (Signed)
Post-Op Visit Note   Patient: [name] [name]           Date of Birth: [birth year]           MRN: [id number] Visit Date: 06/07/2020 PCP: Perlie Mayo, NP   Assessment & Plan:  Chief Complaint:  Chief Complaint  Patient presents with  . Left Knee - Routine Post Op   Visit Diagnoses:  1. Status post total left knee replacement     Plan: Len is 6 weeks status post left total knee replacement.  Overall doing well.  No complaints.  She feels that this 1 has been easier to rehab from than the previous one 8 years ago.  She continues with physical therapy and is making good progress.  Surgical scar is fully healed.  She has excellent range of motion.  Collaterals are stable.  No signs of infection.  Dental prophylaxis reinforced.  Continue to increase activity as tolerated.  Recheck in 6 weeks.  Follow-Up Instructions: Return in about 6 weeks (around 07/19/2020).   Orders:  Orders Placed This Encounter  Procedures  . XR Knee 1-2 Views Left   No orders of the defined types were placed in this encounter.   Imaging: XR Knee 1-2 Views Left  Result Date: 06/07/2020 Stable total knee replacement in good alignment.    PMFS History: Patient Active Problem List   Diagnosis Date Noted  . Status post total left knee replacement 04/18/2020  . Abdominal pain, epigastric 08/14/2019  . Bipolar disorder ([place])   . Hypothyroidism 08/04/2015    Class: Diagnosis of  . Constipation 09/22/2013  . H/O adenomatous polyp of colon 09/22/2013  . Vitamin D deficiency 03/05/2013  . Insomnia 03/05/2013  . HTN (hypertension) 10/15/2011  . Hyperlipidemia 05/11/2009  . Coronary artery disease 05/11/2009  . GERD 05/11/2009   Past Medical History:  Diagnosis Date  . Anemia   . Anxiety   . Arthritis   . BIPOLAR AFFECTIVE DISORDER 05/11/2009   Qualifier: Diagnosis of  By: Orville Govern CMA, Arbie Cookey    . Bipolar affective disorder ([place])   . Bipolar disorder ([place])   . Cataract   . Chest pain 05/11/2009     Qualifier: Diagnosis of  By: Orville Govern CMA, Arbie Cookey    . Chronic back pain   . Chronic cough 11/23/2014  . Chronic fatigue   . Chronic pain syndrome 03/05/2013  . Complication of anesthesia   . Constipation   . Depression   . Difficulty in walking(719.7) 07/26/2011  . Dizziness   . Dyslipidemia   . Fecal incontinence 11/23/2014  . Fibromyalgia   . GERD (gastroesophageal reflux disease)   . H/O anxiety disorder 03/05/2013   Overview:  Heart Cath x 2 per patient; most recent 4 yrs ago with "negative" findings per patient; Cardiologist, Dr Dorris Carnes following; last seen 10/2011; most recent episode 11/2012 with admit Forestine Na [facility]) and negative work-up per patient   . H/O: hysterectomy   . History of back surgery 03/05/2013  . History of gastroesophageal reflux (GERD) 03/05/2013  . History of kidney stones   . Hx of adenomatous colonic polyps 2005  . Hyperlipidemia   . Hypertension   . Hypothyroidism   . Knee pain   . Midsternal chest pain 08/19/2015  . Muscle weakness (generalized) 04/27/2013  . Non-obstructive CAD    a. 08/2015 Cath: LM 40, LCX small, 30ost, 59m, OM2 small, RCA nl/small, EF 55-65%.  . Osteopenia   . PONV (postoperative nausea and vomiting)   .  Primary osteoarthritis of left knee 04/17/2020  . Scalp laceration 03/01/2020  . Sciatica     Family History  Problem Relation Age of Onset  . Heart disease Mother   . Heart attack Mother   . Heart disease Father   . Heart attack Father   . Stroke Sister   . Stroke Maternal Grandfather   . Cancer Paternal Grandmother        unknown primary  . Colon cancer Neg Hx     Past Surgical History:  Procedure Laterality Date  . ABDOMINAL HYSTERECTOMY  1994   TAH- DUB, BSO  . [place] bunionectomy     bilateral  . BREAST BIOPSY Left 1989  . BREAST LUMPECTOMY    . BUNIONECTOMY Left 2005  . CARDIAC CATHETERIZATION  2010   +CAD  . CARDIAC CATHETERIZATION N/A 08/19/2015   Procedure: Left Heart Cath and Coronary Angiography;   Surgeon: Belva Crome, MD;  Location: [facility];  Service: Cardiovascular;  Laterality: N/A;  . COLONOSCOPY  01/29/2007   ZES:PQZRAQ rectum, left-sided diverticula, 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  Remainder of colonic mucosa was normal  . COLONOSCOPY WITH ESOPHAGOGASTRODUODENOSCOPY (EGD) N/A 02/01/2014   TMA:UQJF erosive reflux/gastric polyp/normal rectum/scattered left sided diverticula, normal distal TI. gastric bx negative, fundic gland polyp. next TCS 01/2019.  [facility] CYSTECTOMY     pilonidial cyst  . ESOPHAGOGASTRODUODENOSCOPY     followed by colonoscopy with snare polypectomy  . EYE SURGERY Bilateral    cataract surgery with lens implants  . REVISION TOTAL HIP ARTHROPLASTY Right   . right knee replacement  02/2013  . SHOULDER ARTHROSCOPY Left 2005  . SHOULDER ARTHROSCOPY Right   . SPINAL FUSION    . SPINAL FUSION  2004   L4-L5  . TOTAL HIP ARTHROPLASTY Right 06/2006  . TOTAL HIP ARTHROPLASTY  8/12  . TOTAL KNEE ARTHROPLASTY Left 04/18/2020  . TOTAL KNEE ARTHROPLASTY Left 04/18/2020   Procedure: LEFT TOTAL KNEE ARTHROPLASTY;  Surgeon: Leandrew Koyanagi, MD;  Location: [facility];  Service: Orthopedics;  Laterality: Left;  . TOTAL SHOULDER REPLACEMENT Left 08/2005   Social History   Occupational History  . 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 Smoking status: Never Smoker  . Smokeless tobacco: Never Used  Vaping Use  . Vaping Use: Never used  Substance and Sexual Activity  . Alcohol use: Yes    Alcohol/week: 7.0 standard drinks    Types: 7 Glasses of wine per week    Comment: almost daily  . Drug use: No  . Sexual activity: Not Currently    Partners: Male    Birth control/protection: Surgical    Comment: TAH

## 2020-06-08 ENCOUNTER — Encounter (HOSPITAL_COMMUNITY): Payer: Medicare PPO | Admitting: Physical Therapy

## 2020-06-09 ENCOUNTER — Ambulatory Visit (HOSPITAL_COMMUNITY): Payer: Medicare PPO | Admitting: Physical Therapy

## 2020-06-09 ENCOUNTER — Other Ambulatory Visit: Payer: Self-pay

## 2020-06-09 ENCOUNTER — Encounter (HOSPITAL_COMMUNITY): Payer: Self-pay | Admitting: Physical Therapy

## 2020-06-09 DIAGNOSIS — R2689 Other abnormalities of gait and mobility: Secondary | ICD-10-CM

## 2020-06-09 DIAGNOSIS — M25662 Stiffness of left knee, not elsewhere classified: Secondary | ICD-10-CM

## 2020-06-09 DIAGNOSIS — M25562 Pain in left knee: Secondary | ICD-10-CM | POA: Diagnosis not present

## 2020-06-09 NOTE — Patient Instructions (Signed)
Access Code: [email or web address] URL: [email or web address] Date: 06/09/2020 Prepared by: Mitzi Hansen Oceana Walthall  Exercises Squat with Counter Support - 1 x daily - 7 x weekly - 2 sets - 10 reps

## 2020-06-09 NOTE — Therapy (Signed)
[place], [place], [address] Phone: [phone]   Fax:  [phone]  Physical Therapy Treatment/Progress Note  Patient Details  Name: [name] [name] MRN: [id number] Date of Birth: 1941-04-18 Referring Provider (PT): Smitty Knudsen MD    Encounter Date: 06/09/2020  Progress Note   Reporting Period 05/11/20 to 06/09/20   See note below for Objective Data and Assessment of Progress/Goals    PT End of Session - 06/09/20 0817    Visit Number 10    Number of Visits 18    Date for PT Re-Evaluation 06/24/20    Authorization Type [employer]    Authorization Time Period 05/11/20-06/24/20    Authorization - Visit Number 10    Authorization - Number of Visits 12    Progress Note Due on Visit 20    PT Start Time 0817    PT Stop Time 0857    PT Time Calculation (min) 40 min    Activity Tolerance Patient tolerated treatment well    Behavior During Therapy [facility] for tasks assessed/performed           Past Medical History:  Diagnosis Date   Anemia    Anxiety    Arthritis    BIPOLAR AFFECTIVE DISORDER 05/11/2009   Qualifier: Diagnosis of  By: Orville Govern, CMA, Carol     Bipolar affective disorder ([place])    Bipolar disorder ([place])    Cataract    Chest pain 05/11/2009   Qualifier: Diagnosis of  By: Orville Govern, CMA, Carol     Chronic back pain    Chronic cough 11/23/2014   Chronic fatigue    Chronic pain syndrome 7/78/2423   Complication of anesthesia    Constipation    Depression    Difficulty in walking(719.7) 07/26/2011   Dizziness    Dyslipidemia    Fecal incontinence 11/23/2014   Fibromyalgia    GERD (gastroesophageal reflux disease)    H/O anxiety disorder 03/05/2013   Overview:  Heart Cath x 2 per patient; most recent 4 yrs ago with "negative" findings per patient; Cardiologist, Dr Dorris Carnes following; last seen 10/2011; most recent episode 11/2012 with admit Forestine Na [facility]) and negative  work-up per patient    H/O: hysterectomy    History of back surgery 03/05/2013   History of gastroesophageal reflux (GERD) 03/05/2013   History of kidney stones    Hx of adenomatous colonic polyps 2005   Hyperlipidemia    Hypertension    Hypothyroidism    Knee pain    Midsternal chest pain 08/19/2015   Muscle weakness (generalized) 04/27/2013   Non-obstructive CAD    a. 08/2015 Cath: LM 40, LCX small, 30ost, 56m OM2 small, RCA nl/small, EF 55-65%.   Osteopenia    PONV (postoperative nausea and vomiting)    Primary osteoarthritis of left knee 04/17/2020   Scalp laceration 03/01/2020   Sciatica     Past Surgical History:  Procedure Laterality Date   ABDOMINAL HYSTERECTOMY  1994   TAH- DUB, BSO   [place] bunionectomy     bilateral   BREAST BIOPSY Left 1989   BREAST LUMPECTOMY     BUNIONECTOMY Left 2005   CARDIAC CATHETERIZATION  2010   +CAD   CARDIAC CATHETERIZATION N/A 08/19/2015   Procedure: Left Heart Cath and Coronary Angiography;  Surgeon: HBelva Crome MD;  [facility];  Service: Cardiovascular;  Laterality: N/A;   COLONOSCOPY  01/29/2007   RNTI:RWERXVrectum, left-sided diverticula, diffusely pigmented  colon consistent with melanosis coli.  Remainder of colonic mucosa was normal   COLONOSCOPY WITH ESOPHAGOGASTRODUODENOSCOPY (EGD) N/A 02/01/2014   ORV:IFBP erosive reflux/gastric polyp/normal rectum/scattered left sided diverticula, normal distal TI. gastric bx negative, fundic gland polyp. next TCS 01/2019.   CYSTECTOMY     pilonidial cyst   ESOPHAGOGASTRODUODENOSCOPY     followed by colonoscopy with snare polypectomy   EYE SURGERY Bilateral    cataract surgery with lens implants   REVISION TOTAL HIP ARTHROPLASTY Right    right knee replacement  02/2013   SHOULDER ARTHROSCOPY Left 2005   SHOULDER ARTHROSCOPY Right    SPINAL FUSION     SPINAL FUSION  2004   L4-L5   TOTAL HIP ARTHROPLASTY Right 06/2006   TOTAL HIP  ARTHROPLASTY  8/12   TOTAL KNEE ARTHROPLASTY Left 04/18/2020   TOTAL KNEE ARTHROPLASTY Left 04/18/2020   Procedure: LEFT TOTAL KNEE ARTHROPLASTY;  Surgeon: Leandrew Koyanagi, MD;  Location: [facility];  Service: Orthopedics;  Laterality: Left;   TOTAL SHOULDER REPLACEMENT Left 08/2005    There were no vitals filed for this visit.   Subjective Assessment - 06/09/20 0817    Subjective Patient has a blood blister on her incision and she is curious if it looks alright or not. Patient states her home exercises are going well. Patient states 70% improvement with physical therapy intervention. She remains limited with strength and gait.    Currently in Pain? Yes    Pain Score 4     Pain Location Knee              [facility] PT Assessment - 06/09/20 0001      Assessment   Medical Diagnosis Lt TKA     Referring Provider (PT) Smitty Knudsen MD     Onset Date/Surgical Date 04/18/20    Next MD Visit in 6 weeks      Precautions   Precautions None      Restrictions   Weight Bearing Restrictions No      Balance Screen   Has the patient fallen in the past 6 months No    Has the patient had a decrease in activity level because of a fear of falling?  No    Is the patient reluctant to leave their home because of a fear of falling?  No      Home Ecologist residence      Prior Function   Level of Independence Independent with basic ADLs      Cognition   Overall Cognitive Status Within Functional Limits for tasks assessed      Observation/Other Assessments   Observations Ambulates with SPC, drained small blood blister without redness surrounding    Focus on Therapeutic Outcomes (FOTO)  43% limited      AROM   Left Knee Extension 2   lacking   Left Knee Flexion 118      Strength   Right Hip Flexion 4+/5    Left Hip Flexion 4+/5    Right Knee Flexion 5/5    Right Knee Extension 5/5    Left Knee Flexion 5/5    Left Knee Extension 5/5    Right Ankle Dorsiflexion 5/5     Left Ankle Dorsiflexion 5/5      Ambulation/Gait   Ambulation/Gait Yes    Ambulation/Gait Assistance 6: Modified independent (Device/Increase time)    Ambulation Distance (Feet) 375 Feet    Assistive device None    Gait Pattern Decreased stance  time - left;Decreased step length - right;Decreased step length - left;Decreased stride length;Trendelenburg;Antalgic    Ambulation Surface Level;Indoor    Gait Comments 2MWT                         [facility] Adult PT Treatment/Exercise - 06/09/20 0001      Knee/Hip Exercises: Aerobic   Stationary Bike seat  at position 3   5 minutes       Knee/Hip Exercises: Standing   Lateral Step Up Both;2 sets;10 reps;Hand Hold: 2;Step Height: 6"    Lateral Step Up Limitations eccentric control    Forward Step Up Left;Step Height: 6";Hand Hold: 1;Right;2 sets;15 reps;Both    Functional Squat 2 sets;10 reps    Other Standing Knee Exercises lateral stepping 4x15 feet bilateral                  PT Education - 06/09/20 0816    Education Details Educated on HEP, mechanics of exercise, reassessment findings, goals, seeking medical treatment if "blister" changes in appearance    Person(s) Educated Patient    Methods Explanation;Demonstration    Comprehension Verbalized understanding;Returned demonstration            PT Short Term Goals - 06/09/20 0833      PT SHORT TERM GOAL #1   Title Patient will be independent with initial HEP and self-management strategies to improve functional outcomes    Time 3    Period Weeks    Status Achieved    Target Date 06/03/20      PT SHORT TERM GOAL #2   Title Patient will report at least 25% overall improvement in subjective complaint to indicate improvement in ability to perform ADLs.    Time 3    Period Weeks    Status Achieved    Target Date 06/03/20             PT Long Term Goals - 06/09/20 0834      PT LONG TERM GOAL #1   Title Patient will be able to ambulate at least 325  feet during 2MWT with LRAD to demonstrate improved ability to perform functional mobility and associated tasks.    Time 6    Period Weeks    Status Achieved      PT LONG TERM GOAL #2   Title Patient will report at least 75% overall improvement in subjective complaint to indicate improvement in ability to perform ADLs.    Time 6    Period Weeks    Status On-going      PT LONG TERM GOAL #3   Title Patient will improve FOTO score by 10 % to indicate improvement in functional outcomes    Time 6    Period Weeks    Status Achieved      PT LONG TERM GOAL #4   Title Patient will have LT knee AROM 0-120 degrees to improve functional mobility and facilitate squatting to pick up items from floor.    Time 6    Period Weeks    Status On-going                 Plan - 06/09/20 1117    Clinical Impression Statement Patient has met 2/2 short term goals with ability to complete HEP and improvement in symptoms. Patient has met 2/4 long term goals with improved gait in 2MWT and improved FOTO score. Patient remaining goals not met at this time secondary to  impaired functional strength, balance, gait, and ROM. Patient showing good ROM and strength but is lacking end range motion and functional strength with ADL. Patient continues to be limited by gait as well. Patient fatigues quickly today secondary to impaired activity tolerance and cardiovascular fitness. Patient able to complete step exercises today with unilateral UE support for balance. Patient showing good eccentric control with lateral step down exercise today following demonstration and cueing for mechanics. Patient will continue to benefit from skilled physical therapy in order to reduce impairment and improve function.    Comorbidities fibromyalgia, HTN, arthritis    Examination-Activity Limitations Locomotion Level;Transfers;Stand;Stairs;Squat;Bend    Examination-Participation Restrictions Laundry;Community Activity;Cleaning     Stability/Clinical Decision Making Stable/Uncomplicated    Rehab Potential Good    PT Frequency 3x / week    PT Duration 6 weeks    PT Treatment/Interventions ADLs/Self Care Home Management;Aquatic Therapy;Biofeedback;Cryotherapy;Electrical Stimulation;Iontophoresis 78m/ml Dexamethasone;Moist Heat;Traction;Balance training;Manual lymph drainage;Manual techniques;Therapeutic exercise;Therapeutic activities;Functional mobility training;Splinting;Taping;Vasopneumatic Device;Vestibular;Energy conservation;Dry needling;Gait training;Orthotic Fit/Training;Stair training;DME Instruction;Patient/family education;Cognitive remediation;Contrast Bath;Scar mobilization;Passive range of motion;Spinal Manipulations;Joint Manipulations;Compression bandaging;Neuromuscular re-education;Ultrasound;Parrafin;Fluidtherapy    PT Next Visit Plan Progress functional strengthening exercises and balance as tolerated.    PT Home Exercise Plan 05/11/20: quad set, heel slide, SLR 05/17/20: heel raise, sit to stand; 05/25/20: SLS and tandem stance by counter 7/29 squat    Consulted and Agree with Plan of Care Patient           Patient will benefit from skilled therapeutic intervention in order to improve the following deficits and impairments:  Abnormal gait, Decreased endurance, Hypomobility, Increased edema, Decreased scar mobility, Decreased activity tolerance, Decreased balance, Decreased mobility, Difficulty walking, Decreased strength, Pain, Impaired flexibility, Decreased range of motion  Visit Diagnosis: Stiffness of left knee, not elsewhere classified  Left knee pain, unspecified chronicity  Other abnormalities of gait and mobility     Problem List Patient Active Problem List   Diagnosis Date Noted   Status post total left knee replacement 04/18/2020   Abdominal pain, epigastric 08/14/2019   Bipolar disorder ([place]    Hypothyroidism 08/04/2015    Class: Diagnosis of   Constipation 09/22/2013   H/O  adenomatous polyp of colon 09/22/2013   Vitamin D deficiency 03/05/2013   Insomnia 03/05/2013   HTN (hypertension) 10/15/2011   Hyperlipidemia 05/11/2009   Coronary artery disease 05/11/2009   GERD 05/11/2009    8:59 AM, 06/09/20 AMearl LatinPT, DPT [facility] [address]Phone: [phone]  Fax:  [phone] Name: [name] TRIVEDIMRN: [id number] of Birth: [birth year]

## 2020-06-10 ENCOUNTER — Ambulatory Visit (HOSPITAL_COMMUNITY): Payer: Medicare PPO

## 2020-06-10 ENCOUNTER — Telehealth (HOSPITAL_COMMUNITY): Payer: Self-pay

## 2020-06-10 DIAGNOSIS — I10 Essential (primary) hypertension: Secondary | ICD-10-CM | POA: Diagnosis not present

## 2020-06-10 DIAGNOSIS — E559 Vitamin D deficiency, unspecified: Secondary | ICD-10-CM | POA: Diagnosis not present

## 2020-06-10 NOTE — Telephone Encounter (Signed)
No show, called and unsure if answering machine picked up and no message included just a beep.  Left a message concerning missed apt today.  Included next apt date and time with contact information given.    Ihor Austin, LPTA/CLT; Delana Meyer [phone]

## 2020-06-11 LAB — CBC
Hematocrit: 40.2 % (ref 34.0–46.6)
Hemoglobin: 13.4 g/dL (ref 11.1–15.9)
MCH: 32.4 pg (ref 26.6–33.0)
MCHC: 33.3 g/dL (ref 31.5–35.7)
MCV: 97 fL (ref 79–97)
Platelets: 324 10*3/uL (ref 150–450)
RBC: 4.14 x10E6/uL (ref 3.77–5.28)
RDW: 11.6 % — ABNORMAL LOW (ref 11.7–15.4)
WBC: 8.8 10*3/uL (ref 3.4–10.8)

## 2020-06-11 LAB — TSH: TSH: 3.94 u[IU]/mL (ref 0.450–4.500)

## 2020-06-11 LAB — VITAMIN D 25 HYDROXY (VIT D DEFICIENCY, FRACTURES): Vit D, 25-Hydroxy: 41.9 ng/mL (ref 30.0–100.0)

## 2020-06-13 ENCOUNTER — Ambulatory Visit (HOSPITAL_COMMUNITY): Payer: Medicare PPO | Attending: Orthopaedic Surgery | Admitting: Physical Therapy

## 2020-06-13 ENCOUNTER — Other Ambulatory Visit: Payer: Self-pay

## 2020-06-13 DIAGNOSIS — R2689 Other abnormalities of gait and mobility: Secondary | ICD-10-CM | POA: Diagnosis not present

## 2020-06-13 DIAGNOSIS — M25562 Pain in left knee: Secondary | ICD-10-CM | POA: Insufficient documentation

## 2020-06-13 DIAGNOSIS — M25662 Stiffness of left knee, not elsewhere classified: Secondary | ICD-10-CM | POA: Diagnosis not present

## 2020-06-13 NOTE — Therapy (Signed)
[place], [place], [address] Phone: [phone]   Fax:  [phone]  Physical Therapy Treatment  Patient Details  Name: [name] [name] MRN: [id number] Date of Birth: Aug 08, 1941 Referring Provider (PT): Smitty Knudsen MD    Encounter Date: 06/13/2020   PT End of Session - 06/13/20 1140    Visit Number 11    Number of Visits 18    Date for PT Re-Evaluation 06/24/20    Authorization Type [employer]    Authorization Time Period 05/11/20-06/24/20    Authorization - Visit Number 11    Authorization - Number of Visits 12    Progress Note Due on Visit 20    PT Start Time 1133    PT Stop Time 1211    PT Time Calculation (min) 38 min    Activity Tolerance Patient tolerated treatment well    Behavior During Therapy [facility] for tasks assessed/performed           Past Medical History:  Diagnosis Date  . Anemia   . Anxiety   . Arthritis   . BIPOLAR AFFECTIVE DISORDER 05/11/2009   Qualifier: Diagnosis of  By: Orville Govern CMA, Arbie Cookey    . Bipolar affective disorder ([place])   . Bipolar disorder ([place])   . Cataract   . Chest pain 05/11/2009   Qualifier: Diagnosis of  By: Orville Govern CMA, Arbie Cookey    . Chronic back pain   . Chronic cough 11/23/2014  . Chronic fatigue   . Chronic pain syndrome 03/05/2013  . Complication of anesthesia   . Constipation   . Depression   . Difficulty in walking(719.7) 07/26/2011  . Dizziness   . Dyslipidemia   . Fecal incontinence 11/23/2014  . Fibromyalgia   . GERD (gastroesophageal reflux disease)   . H/O anxiety disorder 03/05/2013   Overview:  Heart Cath x 2 per patient; most recent 4 yrs ago with "negative" findings per patient; Cardiologist, Dr Dorris Carnes following; last seen 10/2011; most recent episode 11/2012 with admit [facility]) and negative work-up per patient   . H/O: hysterectomy   . History of back surgery 03/05/2013  . History of gastroesophageal reflux (GERD) 03/05/2013  . History  of kidney stones   . Hx of adenomatous colonic polyps 2005  . Hyperlipidemia   . Hypertension   . Hypothyroidism   . Knee pain   . Midsternal chest pain 08/19/2015  . Muscle weakness (generalized) 04/27/2013  . Non-obstructive CAD    a. 08/2015 Cath: LM 40, LCX small, 30ost, 42m, OM2 small, RCA nl/small, EF 55-65%.  . Osteopenia   . PONV (postoperative nausea and vomiting)   . Primary osteoarthritis of left knee 04/17/2020  . Scalp laceration 03/01/2020  . Sciatica     Past Surgical History:  Procedure Laterality Date  . ABDOMINAL HYSTERECTOMY  1994   TAH- DUB, BSO  . [place] bunionectomy     bilateral  . BREAST BIOPSY Left 1989  . BREAST LUMPECTOMY    . BUNIONECTOMY Left 2005  . CARDIAC CATHETERIZATION  2010   +CAD  . CARDIAC CATHETERIZATION N/A 08/19/2015   Procedure: Left Heart Cath and Coronary Angiography;  Surgeon: Belva Crome, MD;  Location: [facility];  Service: Cardiovascular;  Laterality: N/A;  . COLONOSCOPY  01/29/2007   ZGY:FVCBSW rectum, left-sided diverticula, 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  Remainder of colonic mucosa was normal  . COLONOSCOPY WITH ESOPHAGOGASTRODUODENOSCOPY (EGD) N/A 02/01/2014   HQP:RFFM erosive  reflux/gastric polyp/normal rectum/scattered left sided diverticula, normal distal TI. gastric bx negative, fundic gland polyp. next TCS 01/2019.  Marland Kitchen CYSTECTOMY     pilonidial cyst  . ESOPHAGOGASTRODUODENOSCOPY     followed by colonoscopy with snare polypectomy  . EYE SURGERY Bilateral    cataract surgery with lens implants  . REVISION TOTAL HIP ARTHROPLASTY Right   . right knee replacement  02/2013  . SHOULDER ARTHROSCOPY Left 2005  . SHOULDER ARTHROSCOPY Right   . SPINAL FUSION    . SPINAL FUSION  2004   L4-L5  . TOTAL HIP ARTHROPLASTY Right 06/2006  . TOTAL HIP ARTHROPLASTY  8/12  . TOTAL KNEE ARTHROPLASTY Left 04/18/2020  . TOTAL KNEE ARTHROPLASTY Left 04/18/2020   Procedure: LEFT TOTAL KNEE ARTHROPLASTY;  Surgeon:  Leandrew Koyanagi, MD;  Location: [facility];  Service: Orthopedics;  Laterality: Left;  . TOTAL SHOULDER REPLACEMENT Left 08/2005    There were no vitals filed for this visit.   Subjective Assessment - 06/13/20 1139    Subjective Patient says her knee is doing ok. Says she feels she still needs strengthening. No pain currently.    Currently in Pain? No/denies                             [facility] Adult PT Treatment/Exercise - 06/13/20 0001      Knee/Hip Exercises: Stretches   Gastroc Stretch Both;3 reps;30 seconds    Gastroc Stretch Limitations slant board      Knee/Hip Exercises: Aerobic   Stationary Bike 4 min Lv 1 seat 4       Knee/Hip Exercises: Standing   Heel Raises 20 reps    Heel Raises Limitations from incline     Knee Flexion Both;20 reps    Knee Flexion Limitations 1#    Hip Abduction Both;20 reps    Abduction Limitations 1#    Lateral Step Up Left;1 set;10 reps;Step Height: 6";Hand Hold: 2    Forward Step Up Step Height: 6";Hand Hold: 1;Both;1 set;10 reps    Functional Squat 2 sets;10 reps    SLS 3 x 10" each solid floor       Knee/Hip Exercises: Supine   Heel Slides Left;10 reps    Knee Extension Left;AROM    Knee Extension Limitations 1    Knee Flexion Left;AROM    Knee Flexion Limitations 120                    PT Short Term Goals - 06/09/20 0932      PT SHORT TERM GOAL #1   Title Patient will be independent with initial HEP and self-management strategies to improve functional outcomes    Time 3    Period Weeks    Status Achieved    Target Date 06/03/20      PT SHORT TERM GOAL #2   Title Patient will report at least 25% overall improvement in subjective complaint to indicate improvement in ability to perform ADLs.    Time 3    Period Weeks    Status Achieved    Target Date 06/03/20             PT Long Term Goals - 06/09/20 0834      PT LONG TERM GOAL #1   Title Patient will be able to ambulate at least 325 feet during  2MWT with LRAD to demonstrate improved ability to perform functional mobility and associated tasks.  Time 6    Period Weeks    Status Achieved      PT LONG TERM GOAL #2   Title Patient will report at least 75% overall improvement in subjective complaint to indicate improvement in ability to perform ADLs.    Time 6    Period Weeks    Status On-going      PT LONG TERM GOAL #3   Title Patient will improve FOTO score by 10 % to indicate improvement in functional outcomes    Time 6    Period Weeks    Status Achieved      PT LONG TERM GOAL #4   Title Patient will have LT knee AROM 0-120 degrees to improve functional mobility and facilitate squatting to pick up items from floor.    Time 6    Period Weeks    Status On-going                 Plan - 06/13/20 1254    Clinical Impression Statement Patient is progressing well toward therapy goals. Patient shows improved static balance but requires intermittent HHA for maintaining SLS. Patient continues to be limited by muscle fatigue and requires frequent rest breaks during session. Patient able to progress hip and knee strengthening with added resistance to knee flexion and hip abduction but with noted increased muscle fatigue.    Comorbidities fibromyalgia, HTN, arthritis    Examination-Activity Limitations Locomotion Level;Transfers;Stand;Stairs;Squat;Bend    Examination-Participation Restrictions Laundry;Community Activity;Cleaning    Stability/Clinical Decision Making Stable/Uncomplicated    Rehab Potential Good    PT Frequency 3x / week    PT Duration 6 weeks    PT Treatment/Interventions ADLs/Self Care Home Management;Aquatic Therapy;Biofeedback;Cryotherapy;Electrical Stimulation;Iontophoresis 4mg /ml Dexamethasone;Moist Heat;Traction;Balance training;Manual lymph drainage;Manual techniques;Therapeutic exercise;Therapeutic activities;Functional mobility training;Splinting;Taping;Vasopneumatic Device;Vestibular;Energy  conservation;Dry needling;Gait training;Orthotic Fit/Training;Stair training;DME Instruction;Patient/family education;Cognitive remediation;Contrast Bath;Scar mobilization;Passive range of motion;Spinal Manipulations;Joint Manipulations;Compression bandaging;Neuromuscular re-education;Ultrasound;Parrafin;Fluidtherapy    PT Next Visit Plan Progress functional strengthening exercises and balance as tolerated.    PT Home Exercise Plan 05/11/20: quad set, heel slide, SLR 05/17/20: heel raise, sit to stand; 05/25/20: SLS and tandem stance by counter 7/29 squat    Consulted and Agree with Plan of Care Patient           Patient will benefit from skilled therapeutic intervention in order to improve the following deficits and impairments:  Abnormal gait, Decreased endurance, Hypomobility, Increased edema, Decreased scar mobility, Decreased activity tolerance, Decreased balance, Decreased mobility, Difficulty walking, Decreased strength, Pain, Impaired flexibility, Decreased range of motion  Visit Diagnosis: Stiffness of left knee, not elsewhere classified  Left knee pain, unspecified chronicity  Other abnormalities of gait and mobility     Problem List Patient Active Problem List   Diagnosis Date Noted  . Status post total left knee replacement 04/18/2020  . Abdominal pain, epigastric 08/14/2019  . Bipolar disorder ([place])   . Hypothyroidism 08/04/2015    Class: Diagnosis of  . Constipation 09/22/2013  . H/O adenomatous polyp of colon 09/22/2013  . Vitamin D deficiency 03/05/2013  . Insomnia 03/05/2013  . HTN (hypertension) 10/15/2011  . Hyperlipidemia 05/11/2009  . Coronary artery disease 05/11/2009  . GERD 05/11/2009    12:59 PM, 06/13/20 Josue Hector PT DPT  Physical Therapist with [facility]  [phone] [place] [address] [place], [place], [address] Phone: [phone]   Fax:  [phone]  Name:  [name] [name] MRN: [id number] Date of Birth:  05/09/1941   

## 2020-06-15 ENCOUNTER — Other Ambulatory Visit: Payer: Self-pay

## 2020-06-15 ENCOUNTER — Ambulatory Visit (HOSPITAL_COMMUNITY): Payer: Medicare PPO | Admitting: Physical Therapy

## 2020-06-15 ENCOUNTER — Encounter (HOSPITAL_COMMUNITY): Payer: Self-pay | Admitting: Physical Therapy

## 2020-06-15 DIAGNOSIS — M25662 Stiffness of left knee, not elsewhere classified: Secondary | ICD-10-CM | POA: Diagnosis not present

## 2020-06-15 DIAGNOSIS — R2689 Other abnormalities of gait and mobility: Secondary | ICD-10-CM | POA: Diagnosis not present

## 2020-06-15 DIAGNOSIS — M25562 Pain in left knee: Secondary | ICD-10-CM

## 2020-06-15 NOTE — Therapy (Signed)
[place], [place], [address] Phone: [phone]   Fax:  [phone]  Physical Therapy Treatment  Patient Details  Name: [name] [name] MRN: [id number] Date of Birth: Dec 13, 1940 Referring Provider (PT): Smitty Knudsen MD    Encounter Date: 06/15/2020   PT End of Session - 06/15/20 1526    Visit Number 12    Number of Visits 18    Date for PT Re-Evaluation 06/29/20    Authorization Type [employer]    Authorization Time Period 05/11/20-06/24/20; Reola Calkins auth requested: 6 visits 06/16/20 - 06/29/20    Authorization - Visit Number 12    Authorization - Number of Visits 12    Progress Note Due on Visit 20    PT Start Time 1355   PT arrived late   PT Stop Time 1430    PT Time Calculation (min) 35 min    Activity Tolerance Patient tolerated treatment well    Behavior During Therapy [facility] for tasks assessed/performed           Past Medical History:  Diagnosis Date  . Anemia   . Anxiety   . Arthritis   . BIPOLAR AFFECTIVE DISORDER 05/11/2009   Qualifier: Diagnosis of  By: Orville Govern CMA, Arbie Cookey    . Bipolar affective disorder ([place])   . Bipolar disorder ([place])   . Cataract   . Chest pain 05/11/2009   Qualifier: Diagnosis of  By: Orville Govern CMA, Arbie Cookey    . Chronic back pain   . Chronic cough 11/23/2014  . Chronic fatigue   . Chronic pain syndrome 03/05/2013  . Complication of anesthesia   . Constipation   . Depression   . Difficulty in walking(719.7) 07/26/2011  . Dizziness   . Dyslipidemia   . Fecal incontinence 11/23/2014  . Fibromyalgia   . GERD (gastroesophageal reflux disease)   . H/O anxiety disorder 03/05/2013   Overview:  Heart Cath x 2 per patient; most recent 4 yrs ago with "negative" findings per patient; Cardiologist, Dr Dorris Carnes following; last seen 10/2011; most recent episode 11/2012 with admit [facility]) and negative work-up per patient   . H/O: hysterectomy   . History of back surgery 03/05/2013  .  History of gastroesophageal reflux (GERD) 03/05/2013  . History of kidney stones   . Hx of adenomatous colonic polyps 2005  . Hyperlipidemia   . Hypertension   . Hypothyroidism   . Knee pain   . Midsternal chest pain 08/19/2015  . Muscle weakness (generalized) 04/27/2013  . Non-obstructive CAD    a. 08/2015 Cath: LM 40, LCX small, 30ost, 45m, OM2 small, RCA nl/small, EF 55-65%.  . Osteopenia   . PONV (postoperative nausea and vomiting)   . Primary osteoarthritis of left knee 04/17/2020  . Scalp laceration 03/01/2020  . Sciatica     Past Surgical History:  Procedure Laterality Date  . ABDOMINAL HYSTERECTOMY  1994   TAH- DUB, BSO  . [place] bunionectomy     bilateral  . BREAST BIOPSY Left 1989  . BREAST LUMPECTOMY    . BUNIONECTOMY Left 2005  . CARDIAC CATHETERIZATION  2010   +CAD  . CARDIAC CATHETERIZATION N/A 08/19/2015   Procedure: Left Heart Cath and Coronary Angiography;  Surgeon: Belva Crome, MD;  Location: [facility];  Service: Cardiovascular;  Laterality: N/A;  . 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  Remainder of colonic mucosa was normal  .  COLONOSCOPY WITH ESOPHAGOGASTRODUODENOSCOPY (EGD) N/A 02/01/2014   VWU:JWJX erosive reflux/gastric polyp/normal rectum/scattered left sided diverticula, normal distal TI. gastric bx negative, fundic gland polyp. next TCS 01/2019.  Marland Kitchen CYSTECTOMY     pilonidial cyst  . ESOPHAGOGASTRODUODENOSCOPY     followed by colonoscopy with snare polypectomy  . EYE SURGERY Bilateral    cataract surgery with lens implants  . REVISION TOTAL HIP ARTHROPLASTY Right   . right knee replacement  02/2013  . SHOULDER ARTHROSCOPY Left 2005  . SHOULDER ARTHROSCOPY Right   . SPINAL FUSION    . SPINAL FUSION  2004   L4-L5  . TOTAL HIP ARTHROPLASTY Right 06/2006  . TOTAL HIP ARTHROPLASTY  8/12  . TOTAL KNEE ARTHROPLASTY Left 04/18/2020  . TOTAL KNEE ARTHROPLASTY  Left 04/18/2020   Procedure: LEFT TOTAL KNEE ARTHROPLASTY;  Surgeon: Leandrew Koyanagi, MD;  Location: [facility];  Service: Orthopedics;  Laterality: Left;  . TOTAL SHOULDER REPLACEMENT Left 08/2005    There were no vitals filed for this visit.   Subjective Assessment - 06/15/20 1403    Subjective Patient reported that she is having 3/10 pain currently. She feels she would benefit from more therapy for strength and balance.    Currently in Pain? Yes    Pain Score 3     Pain Location Knee    Pain Orientation Left    Pain Descriptors / Indicators Aching                             [facility] Adult PT Treatment/Exercise - 06/15/20 0001      Knee/Hip Exercises: Stretches   Gastroc Stretch Both;3 reps;30 seconds    Gastroc Stretch Limitations slant board      Knee/Hip Exercises: Standing   Heel Raises 20 reps    Knee Flexion Both;20 reps    Knee Flexion Limitations 1#    Hip Abduction Both;20 reps    Abduction Limitations 1#    Lateral Step Up Left;1 set;10 reps;Step Height: 6";Hand Hold: 2    Forward Step Up Step Height: 6";Hand Hold: 1;Both;1 set;10 reps    Functional Squat 2 sets;10 reps                    PT Short Term Goals - 06/09/20 9147      PT SHORT TERM GOAL #1   Title Patient will be independent with initial HEP and self-management strategies to improve functional outcomes    Time 3    Period Weeks    Status Achieved    Target Date 06/03/20      PT SHORT TERM GOAL #2   Title Patient will report at least 25% overall improvement in subjective complaint to indicate improvement in ability to perform ADLs.    Time 3    Period Weeks    Status Achieved    Target Date 06/03/20             PT Long Term Goals - 06/09/20 0834      PT LONG TERM GOAL #1   Title Patient will be able to ambulate at least 325 feet during 2MWT with LRAD to demonstrate improved ability to perform functional mobility and associated tasks.    Time 6    Period Weeks     Status Achieved      PT LONG TERM GOAL #2   Title Patient will report at least 75% overall improvement in subjective complaint to indicate  improvement in ability to perform ADLs.    Time 6    Period Weeks    Status On-going      PT LONG TERM GOAL #3   Title Patient will improve FOTO score by 10 % to indicate improvement in functional outcomes    Time 6    Period Weeks    Status Achieved      PT LONG TERM GOAL #4   Title Patient will have LT knee AROM 0-120 degrees to improve functional mobility and facilitate squatting to pick up items from floor.    Time 6    Period Weeks    Status On-going                 Plan - 06/15/20 1532    Clinical Impression Statement Continued progressing patient's strength and balance this session. Patient reports continued deficits in strength and balance and noted patient reaching for support intermittently through session. Recommending patient continue therapy for an additional 6 visits to finish the original 18 visits requested in her plan of care. Patient did require upper extremity assistance with bilateral upper extremities for step ups this session particularly on the right lower extremity. Plan to continue for additional 6 visits and re-assess as needed at that time.    Comorbidities fibromyalgia, HTN, arthritis    Examination-Activity Limitations Locomotion Level;Transfers;Stand;Stairs;Squat;Bend    Examination-Participation Restrictions Laundry;Community Activity;Cleaning    Stability/Clinical Decision Making Stable/Uncomplicated    Rehab Potential Good    PT Frequency 3x / week    PT Duration 2 weeks    PT Treatment/Interventions ADLs/Self Care Home Management;Aquatic Therapy;Biofeedback;Cryotherapy;Electrical Stimulation;Iontophoresis 4mg /ml Dexamethasone;Moist Heat;Traction;Balance training;Manual lymph drainage;Manual techniques;Therapeutic exercise;Therapeutic activities;Functional mobility training;Splinting;Taping;Vasopneumatic  Device;Vestibular;Energy conservation;Dry needling;Gait training;Orthotic Fit/Training;Stair training;DME Instruction;Patient/family education;Cognitive remediation;Contrast Bath;Scar mobilization;Passive range of motion;Spinal Manipulations;Joint Manipulations;Compression bandaging;Neuromuscular re-education;Ultrasound;Parrafin;Fluidtherapy    PT Next Visit Plan Progress functional strengthening exercises and balance as tolerated.    PT Home Exercise Plan 05/11/20: quad set, heel slide, SLR 05/17/20: heel raise, sit to stand; 05/25/20: SLS and tandem stance by counter 7/29 squat    Consulted and Agree with Plan of Care Patient           Patient will benefit from skilled therapeutic intervention in order to improve the following deficits and impairments:  Abnormal gait, Decreased endurance, Hypomobility, Increased edema, Decreased scar mobility, Decreased activity tolerance, Decreased balance, Decreased mobility, Difficulty walking, Decreased strength, Pain, Impaired flexibility, Decreased range of motion  Visit Diagnosis: Stiffness of left knee, not elsewhere classified  Left knee pain, unspecified chronicity  Other abnormalities of gait and mobility     Problem List Patient Active Problem List   Diagnosis Date Noted  . Status post total left knee replacement 04/18/2020  . Abdominal pain, epigastric 08/14/2019  . Bipolar disorder ([place])   . Hypothyroidism 08/04/2015    Class: Diagnosis of  . Constipation 09/22/2013  . H/O adenomatous polyp of colon 09/22/2013  . Vitamin D deficiency 03/05/2013  . Insomnia 03/05/2013  . HTN (hypertension) 10/15/2011  . Hyperlipidemia 05/11/2009  . Coronary artery disease 05/11/2009  . GERD 05/11/2009   Clarene Critchley PT, DPT 3:34 PM, 06/15/20 [place], [place], [address] Phone: [phone]   Fax:  [phone]  Name: [name] [name] MRN: [id number] Date of Birth:  February 15, 1941

## 2020-06-17 ENCOUNTER — Ambulatory Visit (HOSPITAL_COMMUNITY): Payer: Medicare PPO

## 2020-06-17 ENCOUNTER — Encounter (HOSPITAL_COMMUNITY): Payer: Self-pay

## 2020-06-17 ENCOUNTER — Other Ambulatory Visit: Payer: Self-pay

## 2020-06-17 DIAGNOSIS — R2689 Other abnormalities of gait and mobility: Secondary | ICD-10-CM

## 2020-06-17 DIAGNOSIS — M25562 Pain in left knee: Secondary | ICD-10-CM | POA: Diagnosis not present

## 2020-06-17 DIAGNOSIS — M25662 Stiffness of left knee, not elsewhere classified: Secondary | ICD-10-CM | POA: Diagnosis not present

## 2020-06-17 NOTE — Therapy (Signed)
[place], [place], [address] Phone: [phone]   Fax:  [phone]  Physical Therapy Treatment  Patient Details  Name: [name] [name] MRN: [id number] Date of Birth: Feb 04, 1941 Referring Provider (PT): Smitty Knudsen MD    Encounter Date: 06/17/2020   PT End of Session - 06/17/20 1409    Visit Number 13    Number of Visits 18    Date for PT Re-Evaluation 06/29/20    Authorization Type [employer]    Authorization Time Period 05/11/20-06/24/20; Reola Calkins auth requested: 6 visits 06/16/20 - 06/29/20    Authorization - Visit Number 1    Authorization - Number of Visits 6    Progress Note Due on Visit 20    PT Start Time 1404    PT Stop Time 1442    PT Time Calculation (min) 38 min    Equipment Utilized During Treatment --   SPC, CGA   Activity Tolerance Patient tolerated treatment well    Behavior During Therapy [facility] for tasks assessed/performed           Past Medical History:  Diagnosis Date  . Anemia   . Anxiety   . Arthritis   . BIPOLAR AFFECTIVE DISORDER 05/11/2009   Qualifier: Diagnosis of  By: Orville Govern CMA, Arbie Cookey    . Bipolar affective disorder ([place])   . Bipolar disorder ([place])   . Cataract   . Chest pain 05/11/2009   Qualifier: Diagnosis of  By: Orville Govern CMA, Arbie Cookey    . Chronic back pain   . Chronic cough 11/23/2014  . Chronic fatigue   . Chronic pain syndrome 03/05/2013  . Complication of anesthesia   . Constipation   . Depression   . Difficulty in walking(719.7) 07/26/2011  . Dizziness   . Dyslipidemia   . Fecal incontinence 11/23/2014  . Fibromyalgia   . GERD (gastroesophageal reflux disease)   . H/O anxiety disorder 03/05/2013   Overview:  Heart Cath x 2 per patient; most recent 4 yrs ago with "negative" findings per patient; Cardiologist, Dr Dorris Carnes following; last seen 10/2011; most recent episode 11/2012 with admit [facility]) and negative work-up per patient   . H/O: hysterectomy   .  History of back surgery 03/05/2013  . History of gastroesophageal reflux (GERD) 03/05/2013  . History of kidney stones   . Hx of adenomatous colonic polyps 2005  . Hyperlipidemia   . Hypertension   . Hypothyroidism   . Knee pain   . Midsternal chest pain 08/19/2015  . Muscle weakness (generalized) 04/27/2013  . Non-obstructive CAD    a. 08/2015 Cath: LM 40, LCX small, 30ost, 24m, OM2 small, RCA nl/small, EF 55-65%.  . Osteopenia   . PONV (postoperative nausea and vomiting)   . Primary osteoarthritis of left knee 04/17/2020  . Scalp laceration 03/01/2020  . Sciatica     Past Surgical History:  Procedure Laterality Date  . ABDOMINAL HYSTERECTOMY  1994   TAH- DUB, BSO  . [place] bunionectomy     bilateral  . BREAST BIOPSY Left 1989  . BREAST LUMPECTOMY    . BUNIONECTOMY Left 2005  . CARDIAC CATHETERIZATION  2010   +CAD  . CARDIAC CATHETERIZATION N/A 08/19/2015   Procedure: Left Heart Cath and Coronary Angiography;  Surgeon: Belva Crome, MD;  Location: [facility];  Service: Cardiovascular;  Laterality: N/A;  . 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  Remainder of colonic mucosa was normal  . COLONOSCOPY WITH ESOPHAGOGASTRODUODENOSCOPY (EGD) N/A 02/01/2014   HCW:CBJS erosive reflux/gastric polyp/normal rectum/scattered left sided diverticula, normal distal TI. gastric bx negative, fundic gland polyp. next TCS 01/2019.  [facility] CYSTECTOMY     pilonidial cyst  . ESOPHAGOGASTRODUODENOSCOPY     followed by colonoscopy with snare polypectomy  . EYE SURGERY Bilateral    cataract surgery with lens implants  . REVISION TOTAL HIP ARTHROPLASTY Right   . right knee replacement  02/2013  . SHOULDER ARTHROSCOPY Left 2005  . SHOULDER ARTHROSCOPY Right   . SPINAL FUSION    . SPINAL FUSION  2004   L4-L5  . TOTAL HIP ARTHROPLASTY Right 06/2006  . TOTAL HIP ARTHROPLASTY  8/12  . TOTAL KNEE ARTHROPLASTY Left  04/18/2020  . TOTAL KNEE ARTHROPLASTY Left 04/18/2020   Procedure: LEFT TOTAL KNEE ARTHROPLASTY;  Surgeon: Leandrew Koyanagi, MD;  Location: [facility];  Service: Orthopedics;  Laterality: Left;  . TOTAL SHOULDER REPLACEMENT Left 08/2005    There were no vitals filed for this visit.   Subjective Assessment - 06/17/20 1407    Subjective Pt stated both her knee are bothering her today, pain scale 3/10.  Feels she needs to continues working on strength    Patient Stated Goals Be able to walk, be more balanced    Currently in Pain? Yes    Pain Score 3     Pain Location Knee    Pain Orientation Left;Right    Pain Descriptors / Indicators Aching;Sore    Pain Type Surgical pain    Pain Onset 1 to 4 weeks ago    Pain Frequency Constant    Aggravating Factors  standing, bending, walking, stairs    Pain Relieving Factors rest, meds    Effect of Pain on Daily Activities limits                             [facility] Adult PT Treatment/Exercise - 06/17/20 0001      Knee/Hip Exercises: Stretches   Gastroc Stretch Both;3 reps;30 seconds    Gastroc Stretch Limitations slant board      Knee/Hip Exercises: Standing   Heel Raises 20 reps    Heel Raises Limitations from incline     Knee Flexion Both;2 sets;15 reps    Knee Flexion Limitations 1#    Hip Abduction 15 reps;Knee straight    Abduction Limitations 1#   3" holds cueing to reduce ER   Lateral Step Up Both;15 reps;Hand Hold: 1;Step Height: 6"    Forward Step Up Both;15 reps;Hand Hold: 1;Step Height: 6"    Step Down Both;10 reps;Hand Hold: 1;Step Height: 6"    Functional Squat 2 sets;15 reps    Other Standing Knee Exercises 2RT sidestep, cueing to reduce ER    Other Standing Knee Exercises tandem stance 30" x2 each LE lead on foam with intermittent HHA      Knee/Hip Exercises: Seated   Sit to Sand 10 reps;without UE support                    PT Short Term Goals - 06/09/20 2831      PT SHORT TERM GOAL #1   Title  Patient will be independent with initial HEP and self-management strategies to improve functional outcomes    Time 3    Period Weeks    Status Achieved    Target Date 06/03/20  PT SHORT TERM GOAL #2   Title Patient will report at least 25% overall improvement in subjective complaint to indicate improvement in ability to perform ADLs.    Time 3    Period Weeks    Status Achieved    Target Date 06/03/20             PT Long Term Goals - 06/09/20 0834      PT LONG TERM GOAL #1   Title Patient will be able to ambulate at least 325 feet during 2MWT with LRAD to demonstrate improved ability to perform functional mobility and associated tasks.    Time 6    Period Weeks    Status Achieved      PT LONG TERM GOAL #2   Title Patient will report at least 75% overall improvement in subjective complaint to indicate improvement in ability to perform ADLs.    Time 6    Period Weeks    Status On-going      PT LONG TERM GOAL #3   Title Patient will improve FOTO score by 10 % to indicate improvement in functional outcomes    Time 6    Period Weeks    Status Achieved      PT LONG TERM GOAL #4   Title Patient will have LT knee AROM 0-120 degrees to improve functional mobility and facilitate squatting to pick up items from floor.    Time 6    Period Weeks    Status On-going                 Plan - 06/17/20 1430    Clinical Impression Statement Session focus with functional strengthening and balance training.  Pt continues to display deficits in strength and balalnce especially wiht gluteal mm.  Pt fatigue quickly and required several seated rest breaks.  No reports of increased pain, was limited by fatigue.    Personal Factors and Comorbidities Age;Comorbidity 3+    Comorbidities fibromyalgia, HTN, arthritis    Examination-Activity Limitations Locomotion Level;Transfers;Stand;Stairs;Squat;Bend    Examination-Participation Restrictions Laundry;Community Activity;Cleaning     Stability/Clinical Decision Making Stable/Uncomplicated    Clinical Decision Making Low    Rehab Potential Good    PT Frequency 3x / week    PT Duration 2 weeks    PT Treatment/Interventions ADLs/Self Care Home Management;Aquatic Therapy;Biofeedback;Cryotherapy;Electrical Stimulation;Iontophoresis 4mg /ml Dexamethasone;Moist Heat;Traction;Balance training;Manual lymph drainage;Manual techniques;Therapeutic exercise;Therapeutic activities;Functional mobility training;Splinting;Taping;Vasopneumatic Device;Vestibular;Energy conservation;Dry needling;Gait training;Orthotic Fit/Training;Stair training;DME Instruction;Patient/family education;Cognitive remediation;Contrast Bath;Scar mobilization;Passive range of motion;Spinal Manipulations;Joint Manipulations;Compression bandaging;Neuromuscular re-education;Ultrasound;Parrafin;Fluidtherapy    PT Next Visit Plan Progress functional strengthening exercises and balance as tolerated.  Begin sidelying Abd next session, add to HEP if proper form.    PT Home Exercise Plan 05/11/20: quad set, heel slide, SLR 05/17/20: heel raise, sit to stand; 05/25/20: SLS and tandem stance by counter 7/29 squat           Patient will benefit from skilled therapeutic intervention in order to improve the following deficits and impairments:  Abnormal gait, Decreased endurance, Hypomobility, Increased edema, Decreased scar mobility, Decreased activity tolerance, Decreased balance, Decreased mobility, Difficulty walking, Decreased strength, Pain, Impaired flexibility, Decreased range of motion  Visit Diagnosis: Left knee pain, unspecified chronicity  Stiffness of left knee, not elsewhere classified  Other abnormalities of gait and mobility     Problem List Patient Active Problem List   Diagnosis Date Noted  . Status post total left knee replacement 04/18/2020  . Abdominal pain, epigastric 08/14/2019  . Bipolar disorder ([place])   .  Hypothyroidism 08/04/2015    Class:  Diagnosis of  . Constipation 09/22/2013  . H/O adenomatous polyp of colon 09/22/2013  . Vitamin D deficiency 03/05/2013  . Insomnia 03/05/2013  . HTN (hypertension) 10/15/2011  . Hyperlipidemia 05/11/2009  . Coronary artery disease 05/11/2009  . GERD 05/11/2009   Ihor Austin, LPTA/CLT; CBIS [phone]  Aldona Lento 06/17/2020, 2:44 PM  [facility] [address] [place], [place], [address] Phone: [phone]   Fax:  [phone]  Name: [name] [name] MRN: [id number] Date of Birth: 26-Apr-1941

## 2020-06-20 ENCOUNTER — Ambulatory Visit (HOSPITAL_COMMUNITY): Payer: Medicare PPO | Admitting: Physical Therapy

## 2020-06-20 ENCOUNTER — Encounter (HOSPITAL_COMMUNITY): Payer: Self-pay | Admitting: Physical Therapy

## 2020-06-20 ENCOUNTER — Other Ambulatory Visit: Payer: Self-pay

## 2020-06-20 DIAGNOSIS — M25662 Stiffness of left knee, not elsewhere classified: Secondary | ICD-10-CM | POA: Diagnosis not present

## 2020-06-20 DIAGNOSIS — R2689 Other abnormalities of gait and mobility: Secondary | ICD-10-CM

## 2020-06-20 DIAGNOSIS — M25562 Pain in left knee: Secondary | ICD-10-CM | POA: Diagnosis not present

## 2020-06-20 NOTE — Therapy (Signed)
[place], [place], [address] Phone: [phone]   Fax:  [phone]  Physical Therapy Treatment  Patient Details  Name: [name] [name] MRN: [id number] Date of Birth: [birth year] Referring Provider (PT): Smitty Knudsen MD    Encounter Date: 06/20/2020   PT End of Session - 06/20/20 1438    Visit Number 14    Number of Visits 18    Date for PT Re-Evaluation 06/29/20    Authorization Type [employer]    Authorization Time Period 05/11/20-06/24/20; Reola Calkins auth requested: 6 visits 06/16/20 - 06/29/20    Authorization - Visit Number 2    Authorization - Number of Visits 6    Progress Note Due on Visit 20    PT Start Time 1448    PT Stop Time 1510    PT Time Calculation (min) 38 min    Equipment Utilized During Treatment --   SPC, CGA   Activity Tolerance Patient tolerated treatment well    Behavior During Therapy [facility] for tasks assessed/performed           Past Medical History:  Diagnosis Date   Anemia    Anxiety    Arthritis    BIPOLAR AFFECTIVE DISORDER 05/11/2009   Qualifier: Diagnosis of  By: Orville Govern, CMA, Carol     Bipolar affective disorder ([place])    Bipolar disorder ([place])    Cataract    Chest pain 05/11/2009   Qualifier: Diagnosis of  By: Orville Govern, CMA, Carol     Chronic back pain    Chronic cough 11/23/2014   Chronic fatigue    Chronic pain syndrome 1/85/6314   Complication of anesthesia    Constipation    Depression    Difficulty in walking(719.7) 07/26/2011   Dizziness    Dyslipidemia    Fecal incontinence 11/23/2014   Fibromyalgia    GERD (gastroesophageal reflux disease)    H/O anxiety disorder 03/05/2013   Overview:  Heart Cath x 2 per patient; most recent 4 yrs ago with "negative" findings per patient; Cardiologist, Dr Dorris Carnes following; last seen 10/2011; most recent episode 11/2012 with admit [facility]ine [facility]) and negative work-up per patient    H/O: hysterectomy     History of back surgery 03/05/2013   History of gastroesophageal reflux (GERD) 03/05/2013   History of kidney stones    Hx of adenomatous colonic polyps 2005   Hyperlipidemia    Hypertension    Hypothyroidism    Knee pain    Midsternal chest pain 08/19/2015   Muscle weakness (generalized) 04/27/2013   Non-obstructive CAD    a. 08/2015 Cath: LM 40, LCX small, 30ost, 48m, OM2 small, RCA nl/small, EF 55-65%.   Osteopenia    PONV (postoperative nausea and vomiting)    Primary osteoarthritis of left knee 04/17/2020   Scalp laceration 03/01/2020   Sciatica     Past Surgical History:  Procedure Laterality Date   ABDOMINAL HYSTERECTOMY  1994   TAH- DUB, BSO   [place] bunionectomy     bilateral   BREAST BIOPSY Left 1989   BREAST LUMPECTOMY     BUNIONECTOMY Left 2005   CARDIAC CATHETERIZATION  2010   +CAD   CARDIAC CATHETERIZATION N/A 08/19/2015   Procedure: Left Heart Cath and Coronary Angiography;  Surgeon: Belva Crome, MD;  Location: [facility];  Service: Cardiovascular;  Laterality: N/A;   COLONOSCOPY  01/29/2007   HFW:YOVZCH rectum, left-sided diverticula, 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  Remainder of colonic mucosa was normal   COLONOSCOPY WITH ESOPHAGOGASTRODUODENOSCOPY (EGD) N/A 02/01/2014   UKG:URKY erosive reflux/gastric polyp/normal rectum/scattered left sided diverticula, normal distal TI. gastric bx negative, fundic gland polyp. next TCS 01/2019.   CYSTECTOMY     pilonidial cyst   ESOPHAGOGASTRODUODENOSCOPY     followed by colonoscopy with snare polypectomy   EYE SURGERY Bilateral    cataract surgery with lens implants   REVISION TOTAL HIP ARTHROPLASTY Right    right knee replacement  02/2013   SHOULDER ARTHROSCOPY Left 2005   SHOULDER ARTHROSCOPY Right    SPINAL FUSION     SPINAL FUSION  2004   L4-L5   TOTAL HIP ARTHROPLASTY Right 06/2006   TOTAL HIP ARTHROPLASTY  8/12   TOTAL KNEE ARTHROPLASTY Left  04/18/2020   TOTAL KNEE ARTHROPLASTY Left 04/18/2020   Procedure: LEFT TOTAL KNEE ARTHROPLASTY;  Surgeon: Leandrew Koyanagi, MD;  Location: [facility];  Service: Orthopedics;  Laterality: Left;   TOTAL SHOULDER REPLACEMENT Left 08/2005    There were no vitals filed for this visit.   Subjective Assessment - 06/20/20 1436    Subjective Patient says she feels she is getting stronger but not as fast as she would like. Continues to have pain at 3-4/10, thinks this is from doing more exercises recently.    Patient Stated Goals Be able to walk, be more balanced    Currently in Pain? Yes    Pain Score 3     Pain Location Knee    Pain Orientation Left    Pain Descriptors / Indicators Aching;Sore    Pain Type Surgical pain    Pain Onset More than a month ago    Pain Frequency Constant                             [facility] Adult PT Treatment/Exercise - 06/20/20 0001      Knee/Hip Exercises: Stretches   Gastroc Stretch Both;3 reps;30 seconds    Gastroc Stretch Limitations slant board      Knee/Hip Exercises: Aerobic   Recumbent Bike 4 min dynamic warmup seat 4 Lv 2      Knee/Hip Exercises: Standing   Heel Raises 20 reps    Heel Raises Limitations from incline     Knee Flexion Both;2 sets;10 reps    Knee Flexion Limitations 2#    Hip Abduction Both;2 sets;10 reps    Abduction Limitations 2#    Lateral Step Up Both;Step Height: 6";10 reps;Hand Hold: 2    Forward Step Up Both;Hand Hold: 1;Step Height: 6";10 reps    Step Down Both;10 reps;Step Height: 6";Hand Hold: 2    SLS with Vectors 3 x 5" 3RT HHA x 1 each leg     Other Standing Knee Exercises 2RT sidestep, cueing to reduce ER                    PT Short Term Goals - 06/09/20 7062      PT SHORT TERM GOAL #1   Title Patient will be independent with initial HEP and self-management strategies to improve functional outcomes    Time 3    Period Weeks    Status Achieved    Target Date 06/03/20      PT SHORT TERM  GOAL #2   Title Patient will report at least 25% overall improvement in subjective complaint to indicate improvement in ability to perform ADLs.    Time 3  Period Weeks    Status Achieved    Target Date 06/03/20             PT Long Term Goals - 06/09/20 0834      PT LONG TERM GOAL #1   Title Patient will be able to ambulate at least 325 feet during 2MWT with LRAD to demonstrate improved ability to perform functional mobility and associated tasks.    Time 6    Period Weeks    Status Achieved      PT LONG TERM GOAL #2   Title Patient will report at least 75% overall improvement in subjective complaint to indicate improvement in ability to perform ADLs.    Time 6    Period Weeks    Status On-going      PT LONG TERM GOAL #3   Title Patient will improve FOTO score by 10 % to indicate improvement in functional outcomes    Time 6    Period Weeks    Status Achieved      PT LONG TERM GOAL #4   Title Patient will have LT knee AROM 0-120 degrees to improve functional mobility and facilitate squatting to pick up items from floor.    Time 6    Period Weeks    Status On-going                 Plan - 06/20/20 1508    Clinical Impression Statement Patient with good tolerance overall today, though continues to note fatigue with progressive resistance exercise. Patient able to progress to 2# weights with standing LE strengthening. Added SLS with vectors for balance progression. Patient was well challenged with this and requires HHA x 1. Patient continues to require verbal cues to avoid BLE ER with functional activity, specifically standing hip abduction and sidestepping.    Personal Factors and Comorbidities Comorbidity 3+    Comorbidities fibromyalgia, HTN, arthritis    Examination-Activity Limitations Locomotion Level;Transfers;Stand;Stairs;Squat;Bend    Examination-Participation Restrictions Laundry;Community Activity;Cleaning    Stability/Clinical Decision Making  Stable/Uncomplicated    Rehab Potential Good    PT Frequency 3x / week    PT Duration 2 weeks    PT Treatment/Interventions ADLs/Self Care Home Management;Aquatic Therapy;Biofeedback;Cryotherapy;Electrical Stimulation;Iontophoresis 4mg /ml Dexamethasone;Moist Heat;Traction;Balance training;Manual lymph drainage;Manual techniques;Therapeutic exercise;Therapeutic activities;Functional mobility training;Splinting;Taping;Vasopneumatic Device;Vestibular;Energy conservation;Dry needling;Gait training;Orthotic Fit/Training;Stair training;DME Instruction;Patient/family education;Cognitive remediation;Contrast Bath;Scar mobilization;Passive range of motion;Spinal Manipulations;Joint Manipulations;Compression bandaging;Neuromuscular re-education;Ultrasound;Parrafin;Fluidtherapy    PT Next Visit Plan Progress functional strengthening exercises and balance as tolerated.    PT Home Exercise Plan 05/11/20: quad set, heel slide, SLR 05/17/20: heel raise, sit to stand; 05/25/20: SLS and tandem stance by counter 7/29 squat    Consulted and Agree with Plan of Care Patient           Patient will benefit from skilled therapeutic intervention in order to improve the following deficits and impairments:  Abnormal gait, Decreased endurance, Hypomobility, Increased edema, Decreased scar mobility, Decreased activity tolerance, Decreased balance, Decreased mobility, Difficulty walking, Decreased strength, Pain, Impaired flexibility, Decreased range of motion  Visit Diagnosis: Left knee pain, unspecified chronicity  Stiffness of left knee, not elsewhere classified  Other abnormalities of gait and mobility     Problem List Patient Active Problem List   Diagnosis Date Noted   Status post total left knee replacement 04/18/2020   Abdominal pain, epigastric 08/14/2019   Bipolar disorder ([place])    Hypothyroidism 08/04/2015    Class: Diagnosis of   Constipation 09/22/2013   H/O adenomatous polyp of colon  09/22/2013     Vitamin D deficiency 03/05/2013   Insomnia 03/05/2013   HTN (hypertension) 10/15/2011   Hyperlipidemia 05/11/2009   Coronary artery disease 05/11/2009   GERD 05/11/2009   3:13 PM, 06/20/20 Josue Hector PT DPT  Physical Therapist with [facility]  [phone] [place] [address] [place], [place], [address] Phone: [phone]   Fax:  [phone]  Name: [name] [name] MRN: [id number] Date of Birth: 24-Mar-1941

## 2020-06-21 ENCOUNTER — Telehealth: Payer: Self-pay | Admitting: *Deleted

## 2020-06-21 ENCOUNTER — Other Ambulatory Visit: Payer: Self-pay | Admitting: Physician Assistant

## 2020-06-21 MED ORDER — AMOXICILLIN 500 MG PO CAPS
ORAL_CAPSULE | ORAL | 0 refills | Status: DC
Start: 2020-06-21 — End: 2020-08-31

## 2020-06-21 NOTE — Telephone Encounter (Signed)
Thanks! Patient notified.

## 2020-06-21 NOTE — Telephone Encounter (Signed)
I just sent in

## 2020-06-21 NOTE — Telephone Encounter (Signed)
Ortho bundle call 60 days completed. Patient going to dentist on Monday and would like Antibiotic called into [facility] in [place]. Thanks.

## 2020-06-22 ENCOUNTER — Other Ambulatory Visit: Payer: Self-pay

## 2020-06-22 ENCOUNTER — Encounter (HOSPITAL_COMMUNITY): Payer: Self-pay | Admitting: Physical Therapy

## 2020-06-22 ENCOUNTER — Ambulatory Visit (HOSPITAL_COMMUNITY): Payer: Medicare PPO | Admitting: Physical Therapy

## 2020-06-22 DIAGNOSIS — M25662 Stiffness of left knee, not elsewhere classified: Secondary | ICD-10-CM | POA: Diagnosis not present

## 2020-06-22 DIAGNOSIS — R2689 Other abnormalities of gait and mobility: Secondary | ICD-10-CM | POA: Diagnosis not present

## 2020-06-22 DIAGNOSIS — M25562 Pain in left knee: Secondary | ICD-10-CM

## 2020-06-22 NOTE — Therapy (Signed)
[place], [place], [address] Phone: [phone]   Fax:  [phone]  Physical Therapy Treatment  Patient Details  Name: NANCYJO GIVHAN MRN: [id number] Date of Birth: Aug 21, 1941 Referring Provider (PT): Smitty Knudsen MD    Encounter Date: 06/22/2020   PT End of Session - 06/22/20 1348    Visit Number 15    Number of Visits 18    Date for PT Re-Evaluation 06/29/20    Authorization Type [employer]    Authorization Time Period 05/11/20-06/24/20; Reola Calkins auth requested: 6 visits 06/16/20 - 06/29/20    Authorization - Visit Number 3    Authorization - Number of Visits 6    Progress Note Due on Visit 20    PT Start Time 1350    PT Stop Time 1430    PT Time Calculation (min) 40 min    Equipment Utilized During Treatment --   SPC, CGA   Activity Tolerance Patient tolerated treatment well    Behavior During Therapy [facility] for tasks assessed/performed           Past Medical History:  Diagnosis Date  . Anemia   . Anxiety   . Arthritis   . BIPOLAR AFFECTIVE DISORDER 05/11/2009   Qualifier: Diagnosis of  By: Orville Govern CMA, Arbie Cookey    . Bipolar affective disorder ([place])   . Bipolar disorder ([place])   . Cataract   . Chest pain 05/11/2009   Qualifier: Diagnosis of  By: Orville Govern CMA, Arbie Cookey    . Chronic back pain   . Chronic cough 11/23/2014  . Chronic fatigue   . Chronic pain syndrome 03/05/2013  . Complication of anesthesia   . Constipation   . Depression   . Difficulty in walking(719.7) 07/26/2011  . Dizziness   . Dyslipidemia   . Fecal incontinence 11/23/2014  . Fibromyalgia   . GERD (gastroesophageal reflux disease)   . H/O anxiety disorder 03/05/2013   Overview:  Heart Cath x 2 per patient; most recent 4 yrs ago with "negative" findings per patient; Cardiologist, Dr Dorris Carnes following; last seen 10/2011; most recent episode 11/2012 with admit [facility]) and negative work-up per patient   . H/O: hysterectomy   .  History of back surgery 03/05/2013  . History of gastroesophageal reflux (GERD) 03/05/2013  . History of kidney stones   . Hx of adenomatous colonic polyps 2005  . Hyperlipidemia   . Hypertension   . Hypothyroidism   . Knee pain   . Midsternal chest pain 08/19/2015  . Muscle weakness (generalized) 04/27/2013  . Non-obstructive CAD    a. 08/2015 Cath: LM 40, LCX small, 30ost, 71m, OM2 small, RCA nl/small, EF 55-65%.  . Osteopenia   . PONV (postoperative nausea and vomiting)   . Primary osteoarthritis of left knee 04/17/2020  . Scalp laceration 03/01/2020  . Sciatica     Past Surgical History:  Procedure Laterality Date  . ABDOMINAL HYSTERECTOMY  1994   TAH- DUB, BSO  . [place] bunionectomy     bilateral  . BREAST BIOPSY Left 1989  . BREAST LUMPECTOMY    . BUNIONECTOMY Left 2005  . CARDIAC CATHETERIZATION  2010   +CAD  . CARDIAC CATHETERIZATION N/A 08/19/2015   Procedure: Left Heart Cath and Coronary Angiography;  Surgeon: Belva Crome, MD;  Location: [place] [facility];  Service: Cardiovascular;  Laterality: N/A;  . 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  Remainder of colonic mucosa was normal  . COLONOSCOPY WITH ESOPHAGOGASTRODUODENOSCOPY (EGD) N/A 02/01/2014   YKD:XIPJ erosive reflux/gastric polyp/normal rectum/scattered left sided diverticula, normal distal TI. gastric bx negative, fundic gland polyp. next TCS 01/2019.  [facility] CYSTECTOMY     pilonidial cyst  . ESOPHAGOGASTRODUODENOSCOPY     followed by colonoscopy with snare polypectomy  . EYE SURGERY Bilateral    cataract surgery with lens implants  . REVISION TOTAL HIP ARTHROPLASTY Right   . right knee replacement  02/2013  . SHOULDER ARTHROSCOPY Left 2005  . SHOULDER ARTHROSCOPY Right   . SPINAL FUSION    . SPINAL FUSION  2004   L4-L5  . TOTAL HIP ARTHROPLASTY Right 06/2006  . TOTAL HIP ARTHROPLASTY  8/12  . TOTAL KNEE ARTHROPLASTY Left  04/18/2020  . TOTAL KNEE ARTHROPLASTY Left 04/18/2020   Procedure: LEFT TOTAL KNEE ARTHROPLASTY;  Surgeon: Leandrew Koyanagi, MD;  Location: [place];  Service: Orthopedics;  Laterality: Left;  . TOTAL SHOULDER REPLACEMENT Left 08/2005    There were no vitals filed for this visit.   Subjective Assessment - 06/22/20 1352    Subjective Patient says she is doing alright. No pain in LT knee today. RT knee is bothering her "about a 4"    Patient Stated Goals Be able to walk, be more balanced    Currently in Pain? Yes    Pain Score 4     Pain Location Knee    Pain Orientation Right    Pain Descriptors / Indicators Aching    Pain Type Chronic pain    Pain Onset More than a month ago                             [facility] Adult PT Treatment/Exercise - 06/22/20 0001      Knee/Hip Exercises: Stretches   Knee: Self-Stretch to increase Flexion Left;5 reps;10 seconds    Knee: Self-Stretch Limitations 12" step     Gastroc Stretch Both;3 reps;30 seconds    Gastroc Stretch Limitations slant board      Knee/Hip Exercises: Standing   Heel Raises 20 reps    Heel Raises Limitations from incline     Knee Flexion Both;2 sets;10 reps    Knee Flexion Limitations 2#    Hip Flexion Both;20 reps    Hip Flexion Limitations alternating march with 2#     Hip Abduction Both;2 sets;10 reps    Abduction Limitations 2#    Lateral Step Up Both;1 set;10 reps;Hand Hold: 1;Step Height: 6"    Forward Step Up Both;1 set;10 reps;Hand Hold: 1;Step Height: 6"    Step Down Both;1 set;10 reps;Hand Hold: 2;Step Height: 6"    SLS with Vectors 3 x 5" 3RT intermittent HHA each leg     Other Standing Knee Exercises 2RT sidestep, cueing to reduce ER; 4 inch hurdle step over in bars 2RT     Other Standing Knee Exercises tandem stance 30" x2 each on foam                     PT Short Term Goals - 06/09/20 8250      PT SHORT TERM GOAL #1   Title Patient will be independent with initial HEP and  self-management strategies to improve functional outcomes    Time 3    Period Weeks    Status Achieved    Target Date 06/03/20      PT SHORT TERM GOAL #  2   Title Patient will report at least 25% overall improvement in subjective complaint to indicate improvement in ability to perform ADLs.    Time 3    Period Weeks    Status Achieved    Target Date 06/03/20             PT Long Term Goals - 06/09/20 0834      PT LONG TERM GOAL #1   Title Patient will be able to ambulate at least 325 feet during 2MWT with LRAD to demonstrate improved ability to perform functional mobility and associated tasks.    Time 6    Period Weeks    Status Achieved      PT LONG TERM GOAL #2   Title Patient will report at least 75% overall improvement in subjective complaint to indicate improvement in ability to perform ADLs.    Time 6    Period Weeks    Status On-going      PT LONG TERM GOAL #3   Title Patient will improve FOTO score by 10 % to indicate improvement in functional outcomes    Time 6    Period Weeks    Status Achieved      PT LONG TERM GOAL #4   Title Patient will have LT knee AROM 0-120 degrees to improve functional mobility and facilitate squatting to pick up items from floor.    Time 6    Period Weeks    Status On-going                 Plan - 06/22/20 1432    Clinical Impression Statement Patient tolerated session well today. Patient with slight improved to overall tolerance but continues to require rest breaks due to fatigue. Patient cued on avoiding LE ER with hip abduction, and improved with cues. Patient showed improved return to avoiding ER with sidestepping. Added hurled step overs in bars for improved LE ground clearance. Patient required HHA x 1. Patient showed good static balance with tandem standing on foam. Patient repots decreased RT knee pain post session.    Personal Factors and Comorbidities Comorbidity 3+    Comorbidities fibromyalgia, HTN, arthritis     Examination-Activity Limitations Locomotion Level;Transfers;Stand;Stairs;Squat;Bend    Examination-Participation Restrictions Laundry;Community Activity;Cleaning    Stability/Clinical Decision Making Stable/Uncomplicated    Rehab Potential Good    PT Frequency 3x / week    PT Duration 2 weeks    PT Treatment/Interventions ADLs/Self Care Home Management;Aquatic Therapy;Biofeedback;Cryotherapy;Electrical Stimulation;Iontophoresis 4mg /ml Dexamethasone;Moist Heat;Traction;Balance training;Manual lymph drainage;Manual techniques;Therapeutic exercise;Therapeutic activities;Functional mobility training;Splinting;Taping;Vasopneumatic Device;Vestibular;Energy conservation;Dry needling;Gait training;Orthotic Fit/Training;Stair training;DME Instruction;Patient/family education;Cognitive remediation;Contrast Bath;Scar mobilization;Passive range of motion;Spinal Manipulations;Joint Manipulations;Compression bandaging;Neuromuscular re-education;Ultrasound;Parrafin;Fluidtherapy    PT Next Visit Plan Progress functional strengthening exercises and balance as tolerated. Conitnue with dynamic balance and gait    PT Home Exercise Plan 05/11/20: quad set, heel slide, SLR 05/17/20: heel raise, sit to stand; 05/25/20: SLS and tandem stance by counter 7/29 squat    Consulted and Agree with Plan of Care Patient           Patient will benefit from skilled therapeutic intervention in order to improve the following deficits and impairments:  Abnormal gait, Decreased endurance, Hypomobility, Increased edema, Decreased scar mobility, Decreased activity tolerance, Decreased balance, Decreased mobility, Difficulty walking, Decreased strength, Pain, Impaired flexibility, Decreased range of motion  Visit Diagnosis: Left knee pain, unspecified chronicity  Stiffness of left knee, not elsewhere classified  Other abnormalities of gait and mobility     Problem List  Patient Active Problem List   Diagnosis Date Noted  . Status  post total left knee replacement 04/18/2020  . Abdominal pain, epigastric 08/14/2019  . Bipolar disorder ([place])   . Hypothyroidism 08/04/2015    Class: Diagnosis of  . Constipation 09/22/2013  . H/O adenomatous polyp of colon 09/22/2013  . Vitamin D deficiency 03/05/2013  . Insomnia 03/05/2013  . HTN (hypertension) 10/15/2011  . Hyperlipidemia 05/11/2009  . Coronary artery disease 05/11/2009  . GERD 05/11/2009   2:33 PM, 06/22/20 Josue Hector PT DPT  Physical Therapist with [facility]  [phone] [place], [place], [address] Phone: [phone]   Fax:  [phone]  Name: KAMYIAH COLANTONIO MRN: [id number] Date of Birth: [birth year]

## 2020-06-24 ENCOUNTER — Ambulatory Visit (HOSPITAL_COMMUNITY): Payer: Medicare PPO

## 2020-06-24 ENCOUNTER — Telehealth (HOSPITAL_COMMUNITY): Payer: Self-pay

## 2020-06-24 NOTE — Telephone Encounter (Signed)
pt cancelled appt because she was running late

## 2020-06-27 ENCOUNTER — Telehealth (HOSPITAL_COMMUNITY): Payer: Self-pay | Admitting: Physical Therapy

## 2020-06-27 ENCOUNTER — Encounter (HOSPITAL_COMMUNITY): Payer: Medicare PPO | Admitting: Physical Therapy

## 2020-06-27 ENCOUNTER — Encounter (HOSPITAL_COMMUNITY): Payer: Self-pay | Admitting: Physical Therapy

## 2020-06-27 NOTE — Therapy (Signed)
[place], [place], [address] Phone: [phone]   Fax:  [phone]  Patient Details  Name: BREENA BEVACQUA MRN: [id number] Date of Birth: 03/04/1941 Referring Provider:  Smitty Knudsen MD  Encounter Date: 06/27/2020  PHYSICAL THERAPY DISCHARGE SUMMARY   Visits from Start of Care: 15 Current functional level related to goals / functional outcomes: Patient has made good progress toward therapy goals and has partially met all long term goals. Patient called to request DC today per being pleased with current level of function.    Remaining deficits: Mild pain   375 feet on 2MWT with no AD 0-120 AROM     Education / Equipment: Patient educated on transition to DC and home exercise at last session. Patient did not return for formal DC  Plan: Patient agrees to discharge.  Patient goals were partially met. Patient is being discharged due to being pleased with the current functional level.  ?????        9:36 AM, 06/27/20 Josue Hector PT DPT  Physical Therapist with [facility]  [phone] [address] [place], [place], [address] Phone: [phone]   Fax:  [phone]

## 2020-06-27 NOTE — Telephone Encounter (Signed)
Pt called and requested to be D/c

## 2020-06-29 ENCOUNTER — Encounter (HOSPITAL_COMMUNITY): Payer: Medicare PPO | Admitting: Physical Therapy

## 2020-07-01 ENCOUNTER — Encounter (HOSPITAL_COMMUNITY): Payer: Medicare PPO

## 2020-07-04 ENCOUNTER — Encounter (HOSPITAL_COMMUNITY): Payer: Medicare PPO | Admitting: Physical Therapy

## 2020-07-04 ENCOUNTER — Ambulatory Visit: Payer: Medicare PPO | Admitting: Family Medicine

## 2020-07-06 ENCOUNTER — Encounter (HOSPITAL_COMMUNITY): Payer: Medicare PPO | Admitting: Physical Therapy

## 2020-07-08 ENCOUNTER — Encounter (HOSPITAL_COMMUNITY): Payer: Medicare PPO

## 2020-07-11 ENCOUNTER — Ambulatory Visit
Admission: EM | Admit: 2020-07-11 | Discharge: 2020-07-11 | Disposition: A | Discharge status: Home / Self Care | Payer: Medicare PPO | Attending: Emergency Medicine | Admitting: Emergency Medicine

## 2020-07-11 ENCOUNTER — Encounter: Payer: Self-pay | Admitting: Emergency Medicine

## 2020-07-11 ENCOUNTER — Other Ambulatory Visit: Payer: Self-pay

## 2020-07-11 DIAGNOSIS — R109 Unspecified abdominal pain: Secondary | ICD-10-CM | POA: Diagnosis not present

## 2020-07-11 DIAGNOSIS — R350 Frequency of micturition: Secondary | ICD-10-CM | POA: Diagnosis not present

## 2020-07-11 LAB — POCT URINALYSIS DIP (MANUAL ENTRY)
Bilirubin, UA: NEGATIVE
Blood, UA: NEGATIVE
Glucose, UA: NEGATIVE mg/dL
Nitrite, UA: NEGATIVE
Protein Ur, POC: NEGATIVE mg/dL
Spec Grav, UA: 1.01 (ref 1.010–1.025)
Urobilinogen, UA: 0.2 E.U./dL
pH, UA: 7 (ref 5.0–8.0)

## 2020-07-11 MED ORDER — ONDANSETRON 4 MG PO TBDP
4.0000 mg | ORAL_TABLET | Freq: Once | ORAL | Status: AC
  Administered 2020-07-11: 4 mg via ORAL

## 2020-07-11 MED ORDER — KETOROLAC TROMETHAMINE 60 MG/2ML IM SOLN
60.0000 mg | Freq: Once | INTRAMUSCULAR | Status: AC
  Administered 2020-07-11: 60 mg via INTRAMUSCULAR

## 2020-07-11 MED ORDER — ONDANSETRON HCL 4 MG PO TABS
4.0000 mg | ORAL_TABLET | Freq: Four times a day (QID) | ORAL | 0 refills | Status: DC
Start: 2020-07-11 — End: 2020-07-21

## 2020-07-11 MED ORDER — CIPROFLOXACIN HCL 500 MG PO TABS
500.0000 mg | ORAL_TABLET | Freq: Two times a day (BID) | ORAL | 0 refills | Status: AC
Start: 2020-07-11 — End: 2020-07-18

## 2020-07-11 MED ORDER — CEFTRIAXONE SODIUM 1 G IJ SOLR
1.0000 g | Freq: Once | INTRAMUSCULAR | Status: AC
  Administered 2020-07-11: 1 g via INTRAMUSCULAR

## 2020-07-11 NOTE — ED Triage Notes (Signed)
Pain to LT flank pain x 1 week that has gotten worse over the weekend. Denies burning with urination, odor or frequency.

## 2020-07-11 NOTE — ED Provider Notes (Signed)
[facility]   CC: LT flank pain  SUBJECTIVE:  [name] [name] is a 79 y.o. female who complains of LT flank pain and urinary frequency x 1 week.  Patient denies a precipitating event, trauma, strenuous activity, heavy lifting.  Localizes the pain to the LT flank.  Pain is constant describes it as achy, throbbing, and sharp in character.  Has tried OTC medications without relief.  Worse with sitting.  Denies similar symptoms in the past.  Complains of associated nausea, fatigue, and cold chills.  Denies vomiting, abdominal pain, dysuria, urinary urgency, hematuria.    LMP: Patient's last menstrual period was 02/10/1993.  ROS: As in HPI.  All other pertinent ROS negative.     Past Medical History:  Diagnosis Date  . Anemia   . Anxiety   . Arthritis   . BIPOLAR AFFECTIVE DISORDER 05/11/2009   Qualifier: Diagnosis of  By: Orville Govern CMA, Arbie Cookey    . Bipolar affective disorder ([place])   . Bipolar disorder ([place])   . Cataract   . Chest pain 05/11/2009   Qualifier: Diagnosis of  By: Orville Govern CMA, Arbie Cookey    . Chronic back pain   . Chronic cough 11/23/2014  . Chronic fatigue   . Chronic pain syndrome 03/05/2013  . Complication of anesthesia   . Constipation   . Depression   . Difficulty in walking(719.7) 07/26/2011  . Dizziness   . Dyslipidemia   . Fecal incontinence 11/23/2014  . Fibromyalgia   . GERD (gastroesophageal reflux disease)   . H/O anxiety disorder 03/05/2013   Overview:  Heart Cath x 2 per patient; most recent 4 yrs ago with "negative" findings per patient; Cardiologist, Dr Dorris Carnes following; last seen 10/2011; most recent episode 11/2012 with admit Forestine Na [facility]) and negative work-up per patient   . H/O: hysterectomy   . History of back surgery 03/05/2013  . History of gastroesophageal reflux (GERD) 03/05/2013  . History of kidney stones   . Hx of adenomatous colonic polyps 2005  . Hyperlipidemia   . Hypertension   . Hypothyroidism   . Knee pain   . Midsternal  chest pain 08/19/2015  . Muscle weakness (generalized) 04/27/2013  . Non-obstructive CAD    a. 08/2015 Cath: LM 40, LCX small, 30ost, 48m, OM2 small, RCA nl/small, EF 55-65%.  . Osteopenia   . PONV (postoperative nausea and vomiting)   . Primary osteoarthritis of left knee 04/17/2020  . Scalp laceration 03/01/2020  . Sciatica    Past Surgical History:  Procedure Laterality Date  . ABDOMINAL HYSTERECTOMY  1994   TAH- DUB, BSO  . [place] bunionectomy     bilateral  . BREAST BIOPSY Left 1989  . BREAST LUMPECTOMY    . BUNIONECTOMY Left 2005  . CARDIAC CATHETERIZATION  2010   +CAD  . CARDIAC CATHETERIZATION N/A 08/19/2015   Procedure: Left Heart Cath and Coronary Angiography;  Surgeon: Belva Crome, MD;  Location: [place] [facility];  Service: Cardiovascular;  Laterality: N/A;  . 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  Remainder of colonic mucosa was normal  . COLONOSCOPY WITH ESOPHAGOGASTRODUODENOSCOPY (EGD) N/A 02/01/2014   GYF:VCBS erosive reflux/gastric polyp/normal rectum/scattered left sided diverticula, normal distal TI. gastric bx negative, fundic gland polyp. next TCS 01/2019.  Marland Kitchen CYSTECTOMY     pilonidial cyst  . ESOPHAGOGASTRODUODENOSCOPY     followed by colonoscopy with snare polypectomy  . EYE SURGERY Bilateral    cataract  surgery with lens implants  . REVISION TOTAL HIP ARTHROPLASTY Right   . right knee replacement  02/2013  . SHOULDER ARTHROSCOPY Left 2005  . SHOULDER ARTHROSCOPY Right   . SPINAL FUSION    . SPINAL FUSION  2004   L4-L5  . TOTAL HIP ARTHROPLASTY Right 06/2006  . TOTAL HIP ARTHROPLASTY  8/12  . TOTAL KNEE ARTHROPLASTY Left 04/18/2020  . TOTAL KNEE ARTHROPLASTY Left 04/18/2020   Procedure: LEFT TOTAL KNEE ARTHROPLASTY;  Surgeon: Leandrew Koyanagi, MD;  Location: [place];  Service: Orthopedics;  Laterality: Left;  . TOTAL SHOULDER REPLACEMENT Left 08/2005   Allergies  Allergen  Reactions  . Hydrocodone-Acetaminophen Hives and Itching   No current facility-administered medications on file prior to encounter.   Current Outpatient Medications on File Prior to Encounter  Medication Sig Dispense Refill  . ALPRAZolam (XANAX) 0.5 MG tablet Take 0.25-0.5 mg by mouth daily as needed for anxiety.     Marland Kitchen amoxicillin (AMOXIL) 500 MG capsule Take 4 pills one hour prior to dental work 8 capsule 0  . aspirin EC 81 MG tablet Take 1 tablet (81 mg total) by mouth 2 (two) times daily. 84 tablet 0  . atorvastatin (LIPITOR) 40 MG tablet TAKE (1) TABLET BY MOUTH AT BEDTIME. (Patient taking differently: Take 40 mg by mouth daily. TAKE (1) TABLET BY MOUTH AT BEDTIME.) 90 tablet 0  . bisacodyl (WOMENS LAXATIVE) 5 MG EC tablet Take 1 tablet by mouth daily as needed for mild constipation (constipation).     . Calcium Carb-Cholecalciferol (CALCIUM/VITAMIN D PO) Take 1 tablet by mouth daily.    . Cholecalciferol (VITAMIN D) 50 MCG (2000 UT) CAPS Take 2,000 Units by mouth daily.    . Cimetidine (TAGAMET PO) Take 1 tablet by mouth daily.     . DULoxetine (CYMBALTA) 60 MG capsule Take 120 mg by mouth daily.     Marland Kitchen estradiol (ESTRACE) 1 MG tablet TAKE 1/2 TABLET BY MOUTH DAILY. (Patient taking differently: Take 0.5 mg by mouth daily. ) 30 tablet 11  . lamoTRIgine (LAMICTAL) 150 MG tablet Take 150 mg by mouth at bedtime.    Marland Kitchen levothyroxine (SYNTHROID) 25 MCG tablet Take 1 tablet (25 mcg total) by mouth daily. (Patient taking differently: Take 25 mcg by mouth daily. Take at 3AM) 30 tablet 2  . losartan (COZAAR) 50 MG tablet TAKE (1) TABLET BY MOUTH ONCE DAILY. 90 tablet 0  . meloxicam (MOBIC) 7.5 MG tablet Take 1 tablet (7.5 mg total) by mouth 2 (two) times daily. 60 tablet 0  . methocarbamol (ROBAXIN) 500 MG tablet Take 1 tablet (500 mg total) by mouth every 6 (six) hours as needed for muscle spasms. 30 tablet 2  . Multiple Vitamin (MULTIVITAMIN) tablet Take 1 tablet by mouth daily.      . niacin  (SLO-NIACIN) 500 MG tablet Take 500 mg by mouth daily.     . nitroGLYCERIN (NITROSTAT) 0.4 MG SL tablet PLACE 1 TAB UNDER TONGUE EVERY 5 MIN IF NEEDED FOR CHEST PAIN. MAY USE 3 TIMES.NO RELIEF CALL 911. (Patient taking differently: Place 0.4 mg under the tongue every 5 (five) minutes as needed for chest pain. PLACE 1 TAB UNDER TONGUE EVERY 5 MIN IF NEEDED FOR CHEST PAIN. MAY USE 3 TIMES.NO RELIEF CALL 911.) 25 tablet 3  . Omega-3 Fatty Acids (FISH OIL) 1000 MG CAPS Take 1,000 mg by mouth daily.     Marland Kitchen oxyCODONE-acetaminophen (PERCOCET) 5-325 MG tablet Take 1-2 tablets by mouth every 8 (eight) hours  as needed for severe pain. 30 tablet 0  . Probiotic Product (PROBIOTIC DAILY PO) Take 1 tablet by mouth daily.     . risperiDONE (RISPERDAL) 1 MG tablet Take 0.5 mg by mouth at bedtime.     . WELLBUTRIN XL 300 MG 24 hr tablet Take 300 mg by mouth daily.      Social History   Socioeconomic History  . Marital status: Widowed    Spouse name: Passed in Jan 2013  . Number of children: 2  . Years of education: Not on file  . Highest education level: Master's degree (e.g., MA, MS, MEng, MEd, MSW, MBA)  Occupational History  . 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 Smoking status: Never Smoker  . Smokeless tobacco: Never Used  Vaping Use  . Vaping Use: Never used  Substance and Sexual Activity  . Alcohol use: Yes    Alcohol/week: 7.0 standard drinks    Types: 7 Glasses of wine per week    Comment: almost daily  . Drug use: No  . Sexual activity: Not Currently    Partners: Male    Birth control/protection: Surgical    Comment: TAH  Other Topics Concern  . Not on file  Social History Narrative   Lives alone   2 children, 9 grandchildren, 5 great grand   One in [place] and another [place]      Enjoy: reading;       Diet: eats all food groups    Caffeine: 2 cups of coffee, some tea   Water: 6-8 cups day        Wears seat  belt    Does not use phone while driving   Oceanographer at home   No weapons    Social Determinants of Health   Financial Resource Strain: Low Risk   . Difficulty of Paying Living Expenses: Not hard at all  Food Insecurity: No Food Insecurity  . Worried About Charity fundraiser in the Last Year: Never true  . Ran Out of Food in the Last Year: Never true  Transportation Needs: No Transportation Needs  . Lack of Transportation (Medical): No  . Lack of Transportation (Non-Medical): No  Physical Activity:   . Days of Exercise per Week: Not on file  . Minutes of Exercise per Session: Not on file  Stress: No Stress Concern Present  . Feeling of Stress : Only a little  Social Connections: Moderately Isolated  . Frequency of Communication with Friends and Family: More than three times a week  . Frequency of Social Gatherings with Friends and Family: More than three times a week  . Attends Religious Services: More than 4 times per year  . Active Member of Clubs or Organizations: No  . Attends Archivist Meetings: Never  . Marital Status: Widowed  Intimate Partner Violence: Not At Risk  . Fear of Current or Ex-Partner: No  . Emotionally Abused: No  . Physically Abused: No  . Sexually Abused: No   Family History  Problem Relation Age of Onset  . Heart disease Mother   . Heart attack Mother   . Heart disease Father   . Heart attack Father   . Stroke Sister   . Stroke Maternal Grandfather   . Cancer Paternal Grandmother        unknown primary  . Colon cancer Neg Hx     OBJECTIVE:  Vitals:  07/11/20 1158 07/11/20 1200  BP:  (!) 145/86  Pulse:  91  Resp:  18  Temp:  98.3 F (36.8 C)  TempSrc:  Oral  SpO2:  93%  Weight: 119 lb 0.8 oz (54 kg)   Height: 4\' 8"  (1.422 m)    General appearance: Alert in no acute distress; appears uncomfortable, laying on exam table upon entering room HEENT: NCAT.  Oropharynx clear.  Lungs: clear to auscultation bilaterally  without adventitious breath sounds Heart: regular rate and rhythm.   Abdomen: soft; non-distended; no tenderness; bowel sounds present; no guarding Back: Mild LT sided CVA tenderness Extremities: no edema; symmetrical with no gross deformities Skin: warm and dry Neurologic: Ambulates from chair to exam table without difficulty Psychological: alert and cooperative; normal mood and affect  Labs Reviewed  POCT URINALYSIS DIP (MANUAL ENTRY) - Abnormal; Notable for the following components:      Result Value   Ketones, POC UA trace (5) (*)    Leukocytes, UA Small (1+) (*)    All other components within normal limits  URINE CULTURE    ASSESSMENT & PLAN:  1. Left flank pain   2. Urinary frequency     Meds ordered this encounter  Medications  . ketorolac (TORADOL) injection 60 mg  . ondansetron (ZOFRAN-ODT) disintegrating tablet 4 mg  . cefTRIAXone (ROCEPHIN) injection 1 g  . ondansetron (ZOFRAN) 4 MG tablet    Sig: Take 1 tablet (4 mg total) by mouth every 6 (six) hours.    Dispense:  12 tablet    Refill:  0    Order Specific Question:   Supervising Provider    Answer:   Raylene Everts [[id number]]  . ciprofloxacin (CIPRO) 500 MG tablet    Sig: Take 1 tablet (500 mg total) by mouth 2 (two) times daily for 7 days.    Dispense:  14 tablet    Refill:  0    Order Specific Question:   Supervising Provider    Answer:   Raylene Everts [[id number]]   Unable to rule out kidney stone, obstructed stone, or kidney infection in urgent care setting.  Offered patient further evaluation and management in the ED.  Patient declines at this time and would like to try outpatient therapy first.  Aware of the risk associated with this decision including missed diagnosis, organ damage, organ failure, and/or death.  Patient aware and in agreement.     Urine with trace white blood cells.  Given history and presentation will cover for infection today Urine culture sent.  We will call you with abnormal  results.   Push fluids and get plenty of rest.   Toradol shot given in office Zofran given in office Rocephin injection given in office Take antibiotic as directed and to completion Zofran prescribed.  Take as needed for nausea.  Call 911 or go to ER if you have any new or worsening symptoms such as fever, abdominal pain, nausea/vomiting, worsening flank pain, if symptoms do not improve with treatments, etc...  Outlined signs and symptoms indicating need for more acute intervention. Patient verbalized understanding. After Visit Summary given.     Lestine Box, PA-C 07/11/20 1239

## 2020-07-11 NOTE — Discharge Instructions (Signed)
Unable to rule out kidney stone, obstructed stone, or kidney infection in urgent care setting.  Offered patient further evaluation and management in the ED.  Patient declines at this time and would like to try outpatient therapy first.  Aware of the risk associated with this decision including missed diagnosis, organ damage, organ failure, and/or death.  Patient aware and in agreement.     Urine with trace white blood cells.  Given history and presentation will cover for infection today Urine culture sent.  We will call you with abnormal results.   Push fluids and get plenty of rest.   Toradol shot given in office Zofran given in office Rocephin injection given in office Take antibiotic as directed and to completion Zofran prescribed.  Take as needed for nausea.  Call 911 or go to ER if you have any new or worsening symptoms such as fever, abdominal pain, nausea/vomiting, worsening flank pain, if symptoms do not improve with treatments, etc..Marland Kitchen

## 2020-07-12 ENCOUNTER — Other Ambulatory Visit: Payer: Self-pay

## 2020-07-12 ENCOUNTER — Emergency Department (HOSPITAL_COMMUNITY): Payer: Medicare PPO

## 2020-07-12 ENCOUNTER — Encounter (HOSPITAL_COMMUNITY): Payer: Self-pay

## 2020-07-12 ENCOUNTER — Emergency Department (HOSPITAL_COMMUNITY)
Admission: EM | Admit: 2020-07-12 | Discharge: 2020-07-12 | Disposition: A | Discharge status: Home / Self Care | Payer: Medicare PPO | Attending: Emergency Medicine | Admitting: Emergency Medicine

## 2020-07-12 DIAGNOSIS — I700 Atherosclerosis of aorta: Secondary | ICD-10-CM | POA: Diagnosis not present

## 2020-07-12 DIAGNOSIS — Z96652 Presence of left artificial knee joint: Secondary | ICD-10-CM | POA: Diagnosis not present

## 2020-07-12 DIAGNOSIS — E039 Hypothyroidism, unspecified: Secondary | ICD-10-CM | POA: Insufficient documentation

## 2020-07-12 DIAGNOSIS — K5730 Diverticulosis of large intestine without perforation or abscess without bleeding: Secondary | ICD-10-CM | POA: Diagnosis not present

## 2020-07-12 DIAGNOSIS — I10 Essential (primary) hypertension: Secondary | ICD-10-CM | POA: Insufficient documentation

## 2020-07-12 DIAGNOSIS — Z79890 Hormone replacement therapy (postmenopausal): Secondary | ICD-10-CM | POA: Insufficient documentation

## 2020-07-12 DIAGNOSIS — I878 Other specified disorders of veins: Secondary | ICD-10-CM | POA: Diagnosis not present

## 2020-07-12 DIAGNOSIS — R1030 Lower abdominal pain, unspecified: Secondary | ICD-10-CM | POA: Diagnosis not present

## 2020-07-12 DIAGNOSIS — I2510 Atherosclerotic heart disease of native coronary artery without angina pectoris: Secondary | ICD-10-CM | POA: Diagnosis not present

## 2020-07-12 DIAGNOSIS — Z79899 Other long term (current) drug therapy: Secondary | ICD-10-CM | POA: Diagnosis not present

## 2020-07-12 DIAGNOSIS — R109 Unspecified abdominal pain: Secondary | ICD-10-CM | POA: Insufficient documentation

## 2020-07-12 DIAGNOSIS — Z7982 Long term (current) use of aspirin: Secondary | ICD-10-CM | POA: Diagnosis not present

## 2020-07-12 DIAGNOSIS — Z96641 Presence of right artificial hip joint: Secondary | ICD-10-CM | POA: Insufficient documentation

## 2020-07-12 DIAGNOSIS — K8689 Other specified diseases of pancreas: Secondary | ICD-10-CM | POA: Diagnosis not present

## 2020-07-12 LAB — CBC WITH DIFFERENTIAL/PLATELET
Abs Immature Granulocytes: 0.02 10*3/uL (ref 0.00–0.07)
Basophils Absolute: 0.1 10*3/uL (ref 0.0–0.1)
Basophils Relative: 1 %
Eosinophils Absolute: 0.2 10*3/uL (ref 0.0–0.5)
Eosinophils Relative: 2 %
HCT: 42 % (ref 36.0–46.0)
Hemoglobin: 13.5 g/dL (ref 12.0–15.0)
Immature Granulocytes: 0 %
Lymphocytes Relative: 17 %
Lymphs Abs: 1.3 10*3/uL (ref 0.7–4.0)
MCH: 31.8 pg (ref 26.0–34.0)
MCHC: 32.1 g/dL (ref 30.0–36.0)
MCV: 99.1 fL (ref 80.0–100.0)
Monocytes Absolute: 0.7 10*3/uL (ref 0.1–1.0)
Monocytes Relative: 9 %
Neutro Abs: 5.5 10*3/uL (ref 1.7–7.7)
Neutrophils Relative %: 71 %
Platelets: 263 10*3/uL (ref 150–400)
RBC: 4.24 MIL/uL (ref 3.87–5.11)
RDW: 13 % (ref 11.5–15.5)
WBC: 7.7 10*3/uL (ref 4.0–10.5)
nRBC: 0 % (ref 0.0–0.2)

## 2020-07-12 LAB — URINALYSIS, ROUTINE W REFLEX MICROSCOPIC
Bilirubin Urine: NEGATIVE
Glucose, UA: NEGATIVE mg/dL
Hgb urine dipstick: NEGATIVE
Ketones, ur: NEGATIVE mg/dL
Nitrite: NEGATIVE
Protein, ur: NEGATIVE mg/dL
Specific Gravity, Urine: 1.006 (ref 1.005–1.030)
pH: 7 (ref 5.0–8.0)

## 2020-07-12 LAB — COMPREHENSIVE METABOLIC PANEL
ALT: 31 U/L (ref 0–44)
AST: 31 U/L (ref 15–41)
Albumin: 3.9 g/dL (ref 3.5–5.0)
Alkaline Phosphatase: 72 U/L (ref 38–126)
Anion gap: 12 (ref 5–15)
BUN: 9 mg/dL (ref 8–23)
CO2: 27 mmol/L (ref 22–32)
Calcium: 9.5 mg/dL (ref 8.9–10.3)
Chloride: 95 mmol/L — ABNORMAL LOW (ref 98–111)
Creatinine, Ser: 0.81 mg/dL (ref 0.44–1.00)
GFR calc Af Amer: >60 mL/min (ref >60)
GFR calc non Af Amer: >60 mL/min (ref >60)
Glucose, Bld: 118 mg/dL — ABNORMAL HIGH (ref 70–99)
Potassium: 4.6 mmol/L (ref 3.5–5.1)
Sodium: 134 mmol/L — ABNORMAL LOW (ref 135–145)
Total Bilirubin: 0.4 mg/dL (ref 0.3–1.2)
Total Protein: 7.2 g/dL (ref 6.5–8.1)

## 2020-07-12 LAB — URINE CULTURE: Culture: <10000 — AB

## 2020-07-12 MED ORDER — MORPHINE SULFATE (PF) 4 MG/ML IV SOLN
4.0000 mg | Freq: Once | INTRAVENOUS | Status: AC
  Administered 2020-07-12: 4 mg via INTRAMUSCULAR
  Filled 2020-07-12: qty 1

## 2020-07-12 MED ORDER — HYDROCODONE-ACETAMINOPHEN 5-325 MG PO TABS: 1.0000 | ORAL_TABLET | Freq: Four times a day (QID) | ORAL | 0 refills | Status: DC | PRN

## 2020-07-12 NOTE — ED Triage Notes (Signed)
Pt reports left flank pain and nausea x  Week.  Reports went to urgent care yesterday and was diagnosed with a uti and started antibiotics.  Today still having severe left flank pain.  PCP instructed pt to come to ed.

## 2020-07-12 NOTE — ED Notes (Signed)
Patient transported to CT 

## 2020-07-12 NOTE — ED Provider Notes (Signed)
[facility] EMERGENCY DEPARTMENT Provider Note   CSN: [id number] Arrival date & time: 07/12/20  4098     History Chief Complaint  Patient presents with  . Flank Pain    ANALILIA [name] is a 79 y.o. female.  Patient is a 79 year old female with history of bipolar, chronic pain, fibromyalgia, anxiety, hypertension, hyperlipidemia.  She presents today for evaluation of left flank pain.  This is apparently been going on for the past week.  It began in the absence of any injury or trauma.  She describes a constant pain to the left flank that is worse when she moves or changes position.  She denies bowel or bladder complaints.  She was seen yesterday at urgent care and told she may have a UTI.  She was prescribed antibiotics however is not improving.  The history is provided by the patient.       Past Medical History:  Diagnosis Date  . Anemia   . Anxiety   . Arthritis   . BIPOLAR AFFECTIVE DISORDER 05/11/2009   Qualifier: Diagnosis of  By: Orville Govern CMA, Arbie Cookey    . Bipolar affective disorder ([place])   . Bipolar disorder ([place])   . Cataract   . Chest pain 05/11/2009   Qualifier: Diagnosis of  By: Orville Govern CMA, Arbie Cookey    . Chronic back pain   . Chronic cough 11/23/2014  . Chronic fatigue   . Chronic pain syndrome 03/05/2013  . Complication of anesthesia   . Constipation   . Depression   . Difficulty in walking(719.7) 07/26/2011  . Dizziness   . Dyslipidemia   . Fecal incontinence 11/23/2014  . Fibromyalgia   . GERD (gastroesophageal reflux disease)   . H/O anxiety disorder 03/05/2013   Overview:  Heart Cath x 2 per patient; most recent 4 yrs ago with "negative" findings per patient; Cardiologist, Dr Dorris Carnes following; last seen 10/2011; most recent episode 11/2012 with admit Forestine Na [facility]) and negative work-up per patient   . H/O: hysterectomy   . History of back surgery 03/05/2013  . History of gastroesophageal reflux (GERD) 03/05/2013  . History of kidney stones   . Hx of  adenomatous colonic polyps 2005  . Hyperlipidemia   . Hypertension   . Hypothyroidism   . Knee pain   . Midsternal chest pain 08/19/2015  . Muscle weakness (generalized) 04/27/2013  . Non-obstructive CAD    a. 08/2015 Cath: LM 40, LCX small, 30ost, 52m, OM2 small, RCA nl/small, EF 55-65%.  . Osteopenia   . PONV (postoperative nausea and vomiting)   . Primary osteoarthritis of left knee 04/17/2020  . Scalp laceration 03/01/2020  . Sciatica     Patient Active Problem List   Diagnosis Date Noted  . Status post total left knee replacement 04/18/2020  . Abdominal pain, epigastric 08/14/2019  . Bipolar disorder ([place])   . Hypothyroidism 08/04/2015    Class: Diagnosis of  . Constipation 09/22/2013  . H/O adenomatous polyp of colon 09/22/2013  . Vitamin D deficiency 03/05/2013  . Insomnia 03/05/2013  . HTN (hypertension) 10/15/2011  . Hyperlipidemia 05/11/2009  . Coronary artery disease 05/11/2009  . GERD 05/11/2009    Past Surgical History:  Procedure Laterality Date  . ABDOMINAL HYSTERECTOMY  1994   TAH- DUB, BSO  . [place] bunionectomy     bilateral  . BREAST BIOPSY Left 1989  . BREAST LUMPECTOMY    . BUNIONECTOMY Left 2005  . CARDIAC CATHETERIZATION  2010   +CAD  . CARDIAC CATHETERIZATION  N/A 08/19/2015   Procedure: Left Heart Cath and Coronary Angiography;  Surgeon: Belva Crome, MD;  Location: [place] [facility];  Service: Cardiovascular;  Laterality: N/A;  . 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  Remainder of colonic mucosa was normal  . COLONOSCOPY WITH ESOPHAGOGASTRODUODENOSCOPY (EGD) N/A 02/01/2014   HXT:AVWP erosive reflux/gastric polyp/normal rectum/scattered left sided diverticula, normal distal TI. gastric bx negative, fundic gland polyp. next TCS 01/2019.  Marland Kitchen CYSTECTOMY     pilonidial cyst  . ESOPHAGOGASTRODUODENOSCOPY     followed by colonoscopy with snare polypectomy  . EYE  SURGERY Bilateral    cataract surgery with lens implants  . REVISION TOTAL HIP ARTHROPLASTY Right   . right knee replacement  02/2013  . SHOULDER ARTHROSCOPY Left 2005  . SHOULDER ARTHROSCOPY Right   . SPINAL FUSION    . SPINAL FUSION  2004   L4-L5  . TOTAL HIP ARTHROPLASTY Right 06/2006  . TOTAL HIP ARTHROPLASTY  8/12  . TOTAL KNEE ARTHROPLASTY Left 04/18/2020  . TOTAL KNEE ARTHROPLASTY Left 04/18/2020   Procedure: LEFT TOTAL KNEE ARTHROPLASTY;  Surgeon: Leandrew Koyanagi, MD;  Location: [place];  Service: Orthopedics;  Laterality: Left;  . TOTAL SHOULDER REPLACEMENT Left 08/2005     OB History    Gravida  2   Para  2   Term  2   Preterm      AB      Living  2     SAB      TAB      Ectopic      Multiple      Live Births              Family History  Problem Relation Age of Onset  . Heart disease Mother   . Heart attack Mother   . Heart disease Father   . Heart attack Father   . Stroke Sister   . Stroke Maternal Grandfather   . Cancer Paternal Grandmother        unknown primary  . Colon cancer Neg Hx     Social History   Tobacco Use  . Smoking status: Never Smoker  . Smokeless tobacco: Never Used  Vaping Use  . Vaping Use: Never used  Substance Use Topics  . Alcohol use: Yes    Alcohol/week: 7.0 standard drinks    Types: 7 Glasses of wine per week    Comment: almost daily  . Drug use: No    Home Medications Prior to Admission medications   Medication Sig Start Date End Date Taking? Authorizing Provider  ALPRAZolam Duanne Moron) 0.5 MG tablet Take 0.25-0.5 mg by mouth daily as needed for anxiety.  12/15/13   [provider]  amoxicillin (AMOXIL) 500 MG capsule Take 4 pills one hour prior to dental work 06/21/20   Aundra Dubin, PA-C  aspirin EC 81 MG tablet Take 1 tablet (81 mg total) by mouth 2 (two) times daily. 04/19/20   Leandrew Koyanagi, MD  atorvastatin (LIPITOR) 40 MG tablet TAKE (1) TABLET BY MOUTH AT BEDTIME. Patient taking differently: Take  40 mg by mouth daily. TAKE (1) TABLET BY MOUTH AT BEDTIME. 04/08/20   Bhagat, Bhavinkumar, PA  bisacodyl (WOMENS LAXATIVE) 5 MG EC tablet Take 1 tablet by mouth daily as needed for mild constipation (constipation).     [provider]  Calcium Carb-Cholecalciferol (CALCIUM/VITAMIN D PO) Take 1 tablet by mouth daily.  [provider]  Cholecalciferol (VITAMIN D) 50 MCG (2000 UT) CAPS Take 2,000 Units by mouth daily.    [provider]  Cimetidine (TAGAMET PO) Take 1 tablet by mouth daily.     [provider]  ciprofloxacin (CIPRO) 500 MG tablet Take 1 tablet (500 mg total) by mouth 2 (two) times daily for 7 days. 07/11/20 07/18/20  Wurst, Marye Round, PA-C  DULoxetine (CYMBALTA) 60 MG capsule Take 120 mg by mouth daily.     [provider]  estradiol (ESTRACE) 1 MG tablet TAKE 1/2 TABLET BY MOUTH DAILY. Patient taking differently: Take 0.5 mg by mouth daily.  11/14/19   Regina Eck, CNM  lamoTRIgine (LAMICTAL) 150 MG tablet Take 150 mg by mouth at bedtime.    [provider]  levothyroxine (SYNTHROID) 25 MCG tablet Take 1 tablet (25 mcg total) by mouth daily. Patient taking differently: Take 25 mcg by mouth daily. Take at [facility] 03/01/20   Corum, Rex Kras, MD  losartan (COZAAR) 50 MG tablet TAKE (1) TABLET BY MOUTH ONCE DAILY. 06/01/20   Fay Records, MD  meloxicam (MOBIC) 7.5 MG tablet Take 1 tablet (7.5 mg total) by mouth 2 (two) times daily. 05/20/20   Perlie Mayo, NP  methocarbamol (ROBAXIN) 500 MG tablet Take 1 tablet (500 mg total) by mouth every 6 (six) hours as needed for muscle spasms. 04/19/20   Leandrew Koyanagi, MD  Multiple Vitamin (MULTIVITAMIN) tablet Take 1 tablet by mouth daily.      [provider]  niacin (SLO-NIACIN) 500 MG tablet Take 500 mg by mouth daily.     [provider]  nitroGLYCERIN (NITROSTAT) 0.4 MG SL tablet PLACE 1 TAB UNDER TONGUE EVERY 5 MIN IF NEEDED FOR CHEST PAIN. MAY USE 3 TIMES.NO RELIEF CALL  911. Patient taking differently: Place 0.4 mg under the tongue every 5 (five) minutes as needed for chest pain. PLACE 1 TAB UNDER TONGUE EVERY 5 MIN IF NEEDED FOR CHEST PAIN. MAY USE 3 TIMES.NO RELIEF CALL 911. 06/19/17   Lyda Jester M, PA-C  Omega-3 Fatty Acids (FISH OIL) 1000 MG CAPS Take 1,000 mg by mouth daily.     [provider]  ondansetron (ZOFRAN) 4 MG tablet Take 1 tablet (4 mg total) by mouth every 6 (six) hours. 07/11/20   Wurst, [place], PA-C  oxyCODONE-acetaminophen (PERCOCET) 5-325 MG tablet Take 1-2 tablets by mouth every 8 (eight) hours as needed for severe pain. 04/19/20   Leandrew Koyanagi, MD  Probiotic Product (PROBIOTIC DAILY PO) Take 1 tablet by mouth daily.     [provider]  risperiDONE (RISPERDAL) 1 MG tablet Take 0.5 mg by mouth at bedtime.     [provider]  WELLBUTRIN XL 300 MG 24 hr tablet Take 300 mg by mouth daily.  07/08/14   [provider]    Allergies    Hydrocodone-acetaminophen  Review of Systems   Review of Systems  All other systems reviewed and are negative.   Physical Exam Updated Vital Signs BP 133/87 (BP Location: Right Arm)   Pulse 80   Temp 98.5 F (36.9 C) (Oral)   Resp 18   Ht 4\' 8"  (1.422 m)   Wt 54 kg   LMP 02/10/1993   SpO2 96%   BMI 26.69 kg/m   Physical Exam Vitals and nursing note reviewed.  Constitutional:      General: She is not in acute distress.    Appearance: She is well-developed. She is not  diaphoretic.  HENT:     Head: Normocephalic and atraumatic.  Cardiovascular:     Rate and Rhythm: Normal rate and regular rhythm.     Heart sounds: No murmur heard.  No friction rub. No gallop.   Pulmonary:     Effort: Pulmonary effort is normal. No respiratory distress.     Breath sounds: Normal breath sounds. No wheezing.  Abdominal:     General: Bowel sounds are normal. There is no distension.     Palpations: Abdomen is soft.     Tenderness: There is abdominal tenderness. There is  left CVA tenderness. There is no right CVA tenderness, guarding or rebound.  Musculoskeletal:        General: No swelling or tenderness. Normal range of motion.     Cervical back: Normal range of motion and neck supple.     Comments: There is tenderness to palpation in the soft tissues of the left lower lumbar region.  Skin:    General: Skin is warm and dry.  Neurological:     Mental Status: She is alert and oriented to person, place, and time.     ED Results / Procedures / Treatments   Labs (all labs ordered are listed, but only abnormal results are displayed) Labs Reviewed  COMPREHENSIVE METABOLIC PANEL - Abnormal; Notable for the following components:      Result Value   Sodium 134 (*)    Chloride 95 (*)    Glucose, Bld 118 (*)    All other components within normal limits  URINALYSIS, ROUTINE W REFLEX MICROSCOPIC - Abnormal; Notable for the following components:   APPearance HAZY (*)    Leukocytes,Ua MODERATE (*)    Bacteria, UA RARE (*)    Non Squamous Epithelial 0-5 (*)    All other components within normal limits  CBC WITH DIFFERENTIAL/PLATELET    EKG None  Radiology No results found.  Procedures Procedures (including critical care time)  Medications Ordered in ED Medications  morphine 4 MG/ML injection 4 mg (has no administration in time range)    ED Course  I have reviewed the triage vital signs and the nursing notes.  Pertinent labs & imaging results that were available during my care of the patient were reviewed by me and considered in my medical decision making (see chart for details).    MDM Rules/Calculators/A&P  Patient presenting with left flank pain over the past week.  This began in the absence of any injury or trauma.  She was started on an antibiotic yesterday by urgent care for possible UTI.  Her urine today shows moderate leukocytes, but negative nitrite and rare bacteria.  Laboratory studies are unremarkable and she is afebrile.  CT scan  shows no evidence for renal calculus or other pathology that would explain her discomfort.  Whether her pain is related to residual UTI symptoms or is musculoskeletal in nature I am uncertain, but there does not appear to be an emergent cause.  I feels the patient is appropriate for discharge with pain medication and as needed return.  Final Clinical Impression(s) / ED Diagnoses Final diagnoses:  None    Rx / DC Orders ED Discharge Orders    None       Veryl Speak, MD 07/12/20 1233

## 2020-07-12 NOTE — ED Notes (Signed)
States her pain was better until she walked around, states she is allergic to Vicodin. States she has medication at home and there is no need for a new RX

## 2020-07-12 NOTE — Discharge Instructions (Addendum)
Begin taking hydrocodone as prescribed as needed for pain.  Follow-up with your primary doctor if symptoms or not improving in the next 3 to 4 days, and return to the ER for worsening pain, high fever, bloody stool, or other new and concerning symptoms.

## 2020-07-18 ENCOUNTER — Other Ambulatory Visit: Payer: Self-pay | Admitting: Physician Assistant

## 2020-07-19 ENCOUNTER — Ambulatory Visit (INDEPENDENT_AMBULATORY_CARE_PROVIDER_SITE_OTHER): Payer: Medicare PPO

## 2020-07-19 ENCOUNTER — Ambulatory Visit (INDEPENDENT_AMBULATORY_CARE_PROVIDER_SITE_OTHER): Payer: Medicare PPO | Admitting: Orthopaedic Surgery

## 2020-07-19 ENCOUNTER — Encounter: Payer: Self-pay | Admitting: Orthopaedic Surgery

## 2020-07-19 VITALS — Ht <= 58 in | Wt 118.0 lb

## 2020-07-19 DIAGNOSIS — M545 Low back pain, unspecified: Secondary | ICD-10-CM

## 2020-07-19 DIAGNOSIS — Z96652 Presence of left artificial knee joint: Secondary | ICD-10-CM

## 2020-07-19 MED ORDER — MELOXICAM 7.5 MG PO TABS: 7.5000 mg | ORAL_TABLET | Freq: Two times a day (BID) | ORAL | 2 refills | Status: DC | PRN

## 2020-07-19 MED ORDER — PREDNISONE 10 MG (21) PO TBPK: ORAL_TABLET | ORAL | 0 refills | Status: DC

## 2020-07-19 MED ORDER — METAXALONE 800 MG PO TABS: 800.0000 mg | ORAL_TABLET | Freq: Three times a day (TID) | ORAL | 0 refills | Status: DC

## 2020-07-19 NOTE — Progress Notes (Signed)
Office Visit Note   Patient: [name] [name]           Date of Birth: October 15, 1941           MRN: [id number] Visit Date: 07/19/2020              Requested by: Perlie Mayo, NP [address] [place],  [place] [address] PCP: Perlie Mayo, NP   Assessment & Plan: Visit Diagnoses:  1. Status post total left knee replacement   2. Acute left-sided low back pain, unspecified whether sciatica present     Plan: Impression is severe lumbar degenerative scoliosis exacerbation.  No evidence of radiculopathy.  She is also doing well from her left knee replacement 3 months ago.  Dental prophylaxis was reinforced.  Follow-up in 3 months with two-view x-rays of the left knee.  In terms of the lumbar spine I have sent a prescription for prednisone Dosepak, meloxicam, Skelaxin.  She will call us if she has not noticed any relief.  Follow-Up Instructions: Return in about 3 months (around 10/18/2020).   Orders:  Orders Placed This Encounter  Procedures  . XR Lumbar Spine 2-3 Views   Meds ordered this encounter  Medications  . predniSONE (STERAPRED UNI-PAK 21 TAB) 10 MG (21) TBPK tablet    Sig: Take as directed    Dispense:  21 tablet    Refill:  0  . meloxicam (MOBIC) 7.5 MG tablet    Sig: Take 1 tablet (7.5 mg total) by mouth 2 (two) times daily as needed for pain.    Dispense:  30 tablet    Refill:  2  . metaxalone (SKELAXIN) 800 MG tablet    Sig: Take 1 tablet (800 mg total) by mouth 3 (three) times daily.    Dispense:  20 tablet    Refill:  0      Procedures: No procedures performed   Clinical Data: No additional findings.   Subjective: Chief Complaint  Patient presents with  . Left Knee - Follow-up    Left total knee arthroplasty 04/18/2020  . Lower Back - Pain    [name] [name] returns today for 2-month follow-up of her left total knee replacement.  She is doing very well has no complaints.  Her main issue is a separate problem of lower back pain for about a week without any  injuries.  Denies any numbness or tingling or radiation of pain.  The pain is worse with activity.  Denies any bowel or bladder dysfunction.   Review of Systems  Constitutional: Negative.   HENT: Negative.   Eyes: Negative.   Respiratory: Negative.   Cardiovascular: Negative.   Endocrine: Negative.   Musculoskeletal: Negative.   Neurological: Negative.   Hematological: Negative.   Psychiatric/Behavioral: Negative.   All other systems reviewed and are negative.    Objective: Vital Signs: Ht 4\' 8"  (1.422 m)   Wt 118 lb (53.5 kg)   LMP 02/10/1993   BMI 26.46 kg/m   Physical Exam Vitals and nursing note reviewed.  Constitutional:      Appearance: She is well-developed.  Pulmonary:     Effort: Pulmonary effort is normal.  Skin:    General: Skin is warm.     Capillary Refill: Capillary refill takes less than 2 seconds.  Neurological:     Mental Status: She is alert and oriented to person, place, and time.  Psychiatric:        Behavior: Behavior normal.  Thought Content: Thought content normal.        Judgment: Judgment normal.     Ortho Exam Lumbar spine shows mild tenderness of the paraspinous muscles and the spinous processes.  No focal motor or sensory deficits bilaterally in the lower extremities.  Range of motion of the lumbar spine is limited secondary to pain and underlying degenerative scoliosis.  Left knee exam shows fully healed surgical scar with excellent range of motion.  No swelling.  Stable to varus valgus. Specialty Comments:  No specialty comments available.  Imaging: XR Lumbar Spine 2-3 Views  Result Date: 07/19/2020 Status post lumbar fusion.  No acute abnormalities.  Severe degenerative scoliosis    PMFS History: Patient Active Problem List   Diagnosis Date Noted  . Status post total left knee replacement 04/18/2020  . Abdominal pain, epigastric 08/14/2019  . Bipolar disorder ([place])   . Hypothyroidism 08/04/2015    Class: Diagnosis of    . Constipation 09/22/2013  . H/O adenomatous polyp of colon 09/22/2013  . Vitamin D deficiency 03/05/2013  . Insomnia 03/05/2013  . HTN (hypertension) 10/15/2011  . Hyperlipidemia 05/11/2009  . Coronary artery disease 05/11/2009  . GERD 05/11/2009   Past Medical History:  Diagnosis Date  . Anemia   . Anxiety   . Arthritis   . BIPOLAR AFFECTIVE DISORDER 05/11/2009   Qualifier: Diagnosis of  By: Orville Govern CMA, Arbie Cookey    . Bipolar affective disorder ([place])   . Bipolar disorder ([place])   . Cataract   . Chest pain 05/11/2009   Qualifier: Diagnosis of  By: Orville Govern CMA, Arbie Cookey    . Chronic back pain   . Chronic cough 11/23/2014  . Chronic fatigue   . Chronic pain syndrome 03/05/2013  . Complication of anesthesia   . Constipation   . Depression   . Difficulty in walking(719.7) 07/26/2011  . Dizziness   . Dyslipidemia   . Fecal incontinence 11/23/2014  . Fibromyalgia   . GERD (gastroesophageal reflux disease)   . H/O anxiety disorder 03/05/2013   Overview:  Heart Cath x 2 per patient; most recent 4 yrs ago with "negative" findings per patient; Cardiologist, Dr Dorris Carnes following; last seen 10/2011; most recent episode 11/2012 with admit Forestine Na [facility]) and negative work-up per patient   . H/O: hysterectomy   . History of back surgery 03/05/2013  . History of gastroesophageal reflux (GERD) 03/05/2013  . History of kidney stones   . Hx of adenomatous colonic polyps 2005  . Hyperlipidemia   . Hypertension   . Hypothyroidism   . Knee pain   . Midsternal chest pain 08/19/2015  . Muscle weakness (generalized) 04/27/2013  . Non-obstructive CAD    a. 08/2015 Cath: LM 40, LCX small, 30ost, 53m, OM2 small, RCA nl/small, EF 55-65%.  . Osteopenia   . PONV (postoperative nausea and vomiting)   . Primary osteoarthritis of left knee 04/17/2020  . Scalp laceration 03/01/2020  . Sciatica     Family History  Problem Relation Age of Onset  . Heart disease Mother   . Heart attack Mother   . Heart  disease Father   . Heart attack Father   . Stroke Sister   . Stroke Maternal Grandfather   . Cancer Paternal Grandmother        unknown primary  . Colon cancer Neg Hx     Past Surgical History:  Procedure Laterality Date  . ABDOMINAL HYSTERECTOMY  1994   TAH- DUB, BSO  . [place] bunionectomy  bilateral  . BREAST BIOPSY Left 1989  . BREAST LUMPECTOMY    . BUNIONECTOMY Left 2005  . CARDIAC CATHETERIZATION  2010   +CAD  . CARDIAC CATHETERIZATION N/A 08/19/2015   Procedure: Left Heart Cath and Coronary Angiography;  Surgeon: Belva Crome, MD;  Location: [place] [facility];  Service: Cardiovascular;  Laterality: N/A;  . COLONOSCOPY  01/29/2007   GOV:PCHEKB rectum, left-sided diverticula, 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  Remainder of colonic mucosa was normal  . COLONOSCOPY WITH ESOPHAGOGASTRODUODENOSCOPY (EGD) N/A 02/01/2014   TCY:ELYH erosive reflux/gastric polyp/normal rectum/scattered left sided diverticula, normal distal TI. gastric bx negative, fundic gland polyp. next TCS 01/2019.  Marland Kitchen CYSTECTOMY     pilonidial cyst  . ESOPHAGOGASTRODUODENOSCOPY     followed by colonoscopy with snare polypectomy  . EYE SURGERY Bilateral    cataract surgery with lens implants  . REVISION TOTAL HIP ARTHROPLASTY Right   . right knee replacement  02/2013  . SHOULDER ARTHROSCOPY Left 2005  . SHOULDER ARTHROSCOPY Right   . SPINAL FUSION    . SPINAL FUSION  2004   L4-L5  . TOTAL HIP ARTHROPLASTY Right 06/2006  . TOTAL HIP ARTHROPLASTY  8/12  . TOTAL KNEE ARTHROPLASTY Left 04/18/2020  . TOTAL KNEE ARTHROPLASTY Left 04/18/2020   Procedure: LEFT TOTAL KNEE ARTHROPLASTY;  Surgeon: Leandrew Koyanagi, MD;  Location: [place];  Service: Orthopedics;  Laterality: Left;  . TOTAL SHOULDER REPLACEMENT Left 08/2005   Social History   Occupational History  . 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 Smoking status: Never Smoker  .  Smokeless tobacco: Never Used  Vaping Use  . Vaping Use: Never used  Substance and Sexual Activity  . Alcohol use: Yes    Alcohol/week: 7.0 standard drinks    Types: 7 Glasses of wine per week    Comment: almost daily  . Drug use: No  . Sexual activity: Not Currently    Partners: Male    Birth control/protection: Surgical    Comment: TAH

## 2020-07-21 ENCOUNTER — Other Ambulatory Visit: Payer: Self-pay

## 2020-07-21 ENCOUNTER — Ambulatory Visit (INDEPENDENT_AMBULATORY_CARE_PROVIDER_SITE_OTHER): Payer: Medicare PPO | Admitting: Family Medicine

## 2020-07-21 ENCOUNTER — Encounter: Payer: Self-pay | Admitting: Family Medicine

## 2020-07-21 VITALS — BP 118/72 | HR 103 | Temp 97.3°F | Resp 18 | Ht <= 58 in | Wt 119.1 lb

## 2020-07-21 DIAGNOSIS — Z09 Encounter for follow-up examination after completed treatment for conditions other than malignant neoplasm: Secondary | ICD-10-CM

## 2020-07-21 DIAGNOSIS — M4180 Other forms of scoliosis, site unspecified: Secondary | ICD-10-CM | POA: Diagnosis not present

## 2020-07-21 DIAGNOSIS — Z23 Encounter for immunization: Secondary | ICD-10-CM | POA: Insufficient documentation

## 2020-07-21 DIAGNOSIS — M4150 Other secondary scoliosis, site unspecified: Secondary | ICD-10-CM | POA: Insufficient documentation

## 2020-07-21 NOTE — Assessment & Plan Note (Signed)
Review of notes, labs and orders.  Treatment already provided by orthopedist.  Close follow-up as needed.

## 2020-07-21 NOTE — Patient Instructions (Signed)
I appreciate the opportunity to provide you with care for your health and wellness. Today we discussed: recent ER visit   Follow up: 08/31/2020 as scheduled  No labs or referrals today  Glad back is feeling a little bit better. I hope that continues. Call if you think you would like therapy for it.  Flu shot today- tylenol for sore arm if needed.  Please continue to practice social distancing to keep you, your family, and our community safe.  If you must go out, please wear a mask and practice good handwashing.  It was a pleasure to see you and I look forward to continuing to work together on your health and well-being. Please do not hesitate to call the office if you need care or have questions about your care.  Have a wonderful day and week. With Gratitude, Cherly Beach, DNP, AGNP-BC

## 2020-07-21 NOTE — Assessment & Plan Note (Addendum)
She isIs followed by orthopedics.  I have suggested that therapy might be a good idea.  She is encouraged to continue all current medications as prescribed.

## 2020-07-21 NOTE — Assessment & Plan Note (Signed)

## 2020-07-21 NOTE — Progress Notes (Signed)
Subjective:  Patient ID: [name] [name], female    DOB: 1941-08-12  Age: 79 y.o. MRN: [id number]  CC:  Chief Complaint  Patient presents with   Follow-up    er follow up was seen in er 07-12-20 for lower back pain has been seen by ortho in  they gave her prednisone maloxicam and muscle relaxer is feeling better today they said it was due to degenerative disc disease       HPI  HPI [name] [name] is a 79 year old female patient of mine.  She presents today for follow-up after going to the emergency room.  She presented to the emergency room on August 31.  After having left flank pain there was question is whether or not she had a kidney stone or urinary tract infection.  She went to the urgent care the day before.  She has had no injury or trauma to the site.  She has had no changes in bowel or bladder habits.  She was prescribed antibiotics however she was not improving so that is why she presented to the emergency room after going to the urgent care.  She was provided with morphine without much relief.  Provided with hydrocodone as needed. She has since been seen by orthopedics.  He was prescribed treatment plan for. He got lumbar spine x-rays which showed mild tenderness of the paraspinous muscles and processes.  Lumbar spine is limited secondary to pain and underlying degenerative scoliosis.  Her left knee is healing well from her previous procedure.  He prescribed her prednisone, meloxicam and Skelaxin.  She was advised to follow-up if no relief occurred.  Possible need for therapy in the near future.  She reports that since being on the medications from the orthopedist she has started to feel little bit better.  Today patient denies signs and symptoms of COVID 19 infection including fever, chills, cough, shortness of breath, and headache. Past Medical, Surgical, Social History, Allergies, and Medications have been Reviewed.   Past Medical History:  Diagnosis Date   Anemia     Anxiety    Arthritis    BIPOLAR AFFECTIVE DISORDER 05/11/2009   Qualifier: Diagnosis of  By: Orville Govern, CMA, Carol     Bipolar affective disorder [facility])    Bipolar disorder ([place])    Cataract    Chest pain 05/11/2009   Qualifier: Diagnosis of  By: Orville Govern, CMA, Carol     Chronic back pain    Chronic cough 11/23/2014   Chronic fatigue    Chronic pain syndrome 01/25/1760   Complication of anesthesia    Constipation    Depression    Difficulty in walking(719.7) 07/26/2011   Dizziness    Dyslipidemia    Fecal incontinence 11/23/2014   Fibromyalgia    GERD (gastroesophageal reflux disease)    H/O anxiety disorder 03/05/2013   Overview:  Heart Cath x 2 per patient; most recent 4 yrs ago with "negative" findings per patient; Cardiologist, Dr Dorris Carnes following; last seen 10/2011; most recent episode 11/2012 with admit Forestine Na [facility]) and negative work-up per patient    H/O: hysterectomy    History of back surgery 03/05/2013   History of gastroesophageal reflux (GERD) 03/05/2013   History of kidney stones    Hx of adenomatous colonic polyps 2005   Hyperlipidemia    Hypertension    Hypothyroidism    Knee pain    Midsternal chest pain 08/19/2015   Muscle weakness (generalized) 04/27/2013   Non-obstructive CAD  a. 08/2015 Cath: LM 40, LCX small, 30ost, 53m, OM2 small, RCA nl/small, EF 55-65%.   Osteopenia    PONV (postoperative nausea and vomiting)    Primary osteoarthritis of left knee 04/17/2020   Scalp laceration 03/01/2020   Sciatica     Current Meds  Medication Sig   ALPRAZolam (XANAX) 0.5 MG tablet Take 0.25-0.5 mg by mouth daily as needed for anxiety.    amoxicillin (AMOXIL) 500 MG capsule Take 4 pills one hour prior to dental work   aspirin EC 81 MG tablet Take 1 tablet (81 mg total) by mouth 2 (two) times daily.   atorvastatin (LIPITOR) 40 MG tablet TAKE (1) TABLET BY MOUTH AT BEDTIME.   bisacodyl (WOMENS LAXATIVE) 5 MG EC tablet Take  1 tablet by mouth daily as needed for mild constipation (constipation).    Calcium Carb-Cholecalciferol (CALCIUM/VITAMIN D PO) Take 1 tablet by mouth daily.   Cholecalciferol (VITAMIN D) 50 MCG (2000 UT) CAPS Take 2,000 Units by mouth daily.   Cimetidine (TAGAMET PO) Take 1 tablet by mouth daily.    DULoxetine (CYMBALTA) 60 MG capsule Take 120 mg by mouth daily.    estradiol (ESTRACE) 1 MG tablet TAKE 1/2 TABLET BY MOUTH DAILY. (Patient taking differently: Take 0.5 mg by mouth daily. )   lamoTRIgine (LAMICTAL) 150 MG tablet Take 150 mg by mouth at bedtime.   levothyroxine (SYNTHROID) 25 MCG tablet Take 1 tablet (25 mcg total) by mouth daily. (Patient taking differently: Take 25 mcg by mouth daily. Take at 3AM)   losartan (COZAAR) 50 MG tablet TAKE (1) TABLET BY MOUTH ONCE DAILY.   meloxicam (MOBIC) 7.5 MG tablet Take 1 tablet (7.5 mg total) by mouth 2 (two) times daily as needed for pain.   metaxalone (SKELAXIN) 800 MG tablet Take 1 tablet (800 mg total) by mouth 3 (three) times daily.   methocarbamol (ROBAXIN) 500 MG tablet Take 1 tablet (500 mg total) by mouth every 6 (six) hours as needed for muscle spasms.   Multiple Vitamin (MULTIVITAMIN) tablet Take 1 tablet by mouth daily.     niacin (SLO-NIACIN) 500 MG tablet Take 500 mg by mouth daily.    nitroGLYCERIN (NITROSTAT) 0.4 MG SL tablet PLACE 1 TAB UNDER TONGUE EVERY 5 MIN IF NEEDED FOR CHEST PAIN. MAY USE 3 TIMES.NO RELIEF CALL 911. (Patient taking differently: Place 0.4 mg under the tongue every 5 (five) minutes as needed for chest pain. PLACE 1 TAB UNDER TONGUE EVERY 5 MIN IF NEEDED FOR CHEST PAIN. MAY USE 3 TIMES.NO RELIEF CALL 911.)   Omega-3 Fatty Acids (FISH OIL) 1000 MG CAPS Take 1,000 mg by mouth daily.    oxyCODONE-acetaminophen (PERCOCET) 5-325 MG tablet Take 1-2 tablets by mouth every 8 (eight) hours as needed for severe pain.   predniSONE (STERAPRED UNI-PAK 21 TAB) 10 MG (21) TBPK tablet Take as directed    Probiotic Product (PROBIOTIC DAILY PO) Take 1 tablet by mouth daily.    risperiDONE (RISPERDAL) 1 MG tablet Take 0.5 mg by mouth at bedtime.    WELLBUTRIN XL 300 MG 24 hr tablet Take 300 mg by mouth daily.    [DISCONTINUED] predniSONE (DELTASONE) 10 MG tablet TAKE AS DIRECTED PEREPACKAGE INSTRUCTIONS.    ROS:  Review of Systems  Constitutional: Negative.   HENT: Negative.   Eyes: Negative.   Respiratory: Negative.   Cardiovascular: Negative.   Gastrointestinal: Negative.   Genitourinary: Negative.   Musculoskeletal: Positive for back pain.  Skin: Negative.   Neurological: Negative.   Endo/Heme/Allergies:  Negative.   Psychiatric/Behavioral: Negative.      Objective:   Today's Vitals: BP 118/72 (BP Location: Left Arm, Patient Position: Sitting, Cuff Size: Normal)    Pulse (!) 103    Temp (!) 97.3 F (36.3 C) (Temporal)    Resp 18    Ht 4\' 9"  (1.448 m)    Wt 119 lb 1.9 oz (54 kg)    LMP 02/10/1993    SpO2 99%    BMI 25.78 kg/m  Vitals with BMI 07/21/2020 07/19/2020 07/12/2020  Height 4\' 9"  4\' 8"  -  Weight 119 lbs 2 oz 118 lbs -  BMI 37.90 24.09 -  Systolic 735 - 329  Diastolic 72 - 64  Pulse 924 - 84     Physical Exam Vitals and nursing note reviewed.  Constitutional:      Appearance: Normal appearance. She is well-developed, well-groomed and overweight.  HENT:     Head: Normocephalic and atraumatic.     Right Ear: External ear normal.     Left Ear: External ear normal.  Eyes:     General:        Right eye: No discharge.        Left eye: No discharge.     Conjunctiva/sclera: Conjunctivae normal.  Cardiovascular:     Rate and Rhythm: Normal rate and regular rhythm.     Pulses: Normal pulses.     Heart sounds: Normal heart sounds.  Pulmonary:     Effort: Pulmonary effort is normal.     Breath sounds: Normal breath sounds.  Musculoskeletal:     Cervical back: Normal range of motion and neck supple.     Comments: MAE, limited ROM of spine   Skin:    General: Skin  is warm.  Neurological:     General: No focal deficit present.     Mental Status: She is alert and oriented to person, place, and time.  Psychiatric:        Mood and Affect: Mood normal.        Behavior: Behavior normal. Behavior is cooperative.        Thought Content: Thought content normal.        Judgment: Judgment normal.      Assessment   1. Encounter for examination following treatment at hospital   2. Need for immunization against influenza   3. Degenerative scoliosis in adult patient     Tests ordered Orders Placed This Encounter  Procedures   Flu Vaccine QUAD High Dose(Fluad)     Plan: Please see assessment and plan per problem list above.   No orders of the defined types were placed in this encounter.   Patient to follow-up in 08/31/2020.  Perlie Mayo, NP

## 2020-07-22 ENCOUNTER — Telehealth: Payer: Self-pay | Admitting: *Deleted

## 2020-07-22 NOTE — Telephone Encounter (Signed)
Ortho bundle 90 day call completed. 

## 2020-08-04 ENCOUNTER — Other Ambulatory Visit: Payer: Self-pay | Admitting: Physician Assistant

## 2020-08-04 DIAGNOSIS — E039 Hypothyroidism, unspecified: Secondary | ICD-10-CM

## 2020-08-05 ENCOUNTER — Other Ambulatory Visit: Payer: Self-pay | Admitting: Family Medicine

## 2020-08-05 DIAGNOSIS — E039 Hypothyroidism, unspecified: Secondary | ICD-10-CM

## 2020-08-06 ENCOUNTER — Other Ambulatory Visit: Payer: Self-pay | Admitting: Family Medicine

## 2020-08-22 ENCOUNTER — Other Ambulatory Visit: Payer: Self-pay

## 2020-08-22 DIAGNOSIS — E039 Hypothyroidism, unspecified: Secondary | ICD-10-CM

## 2020-08-22 MED ORDER — LEVOTHYROXINE SODIUM 25 MCG PO TABS: 25.0000 ug | ORAL_TABLET | Freq: Every day | ORAL | 2 refills | Status: DC

## 2020-08-23 ENCOUNTER — Ambulatory Visit: Payer: Medicare Other | Admitting: Certified Nurse Midwife

## 2020-08-30 ENCOUNTER — Other Ambulatory Visit: Payer: Self-pay | Admitting: Internal Medicine

## 2020-08-31 ENCOUNTER — Other Ambulatory Visit: Payer: Self-pay

## 2020-08-31 ENCOUNTER — Encounter: Payer: Self-pay | Admitting: Family Medicine

## 2020-08-31 ENCOUNTER — Ambulatory Visit: Payer: Medicare PPO | Admitting: Family Medicine

## 2020-08-31 VITALS — BP 110/70 | HR 72 | Temp 97.5°F | Ht <= 58 in | Wt 115.0 lb

## 2020-08-31 DIAGNOSIS — I10 Essential (primary) hypertension: Secondary | ICD-10-CM

## 2020-08-31 DIAGNOSIS — F310 Bipolar disorder, current episode hypomanic: Secondary | ICD-10-CM | POA: Diagnosis not present

## 2020-08-31 DIAGNOSIS — E039 Hypothyroidism, unspecified: Secondary | ICD-10-CM | POA: Diagnosis not present

## 2020-08-31 NOTE — Assessment & Plan Note (Signed)
Managed by outside provider. Doing well, continue medications has ordered.

## 2020-08-31 NOTE — Assessment & Plan Note (Signed)
Controlled, continue all medications as ordered.  DASH diet and Exercise encouraged.

## 2020-08-31 NOTE — Progress Notes (Signed)
Subjective:  Patient ID: [name] [name], female    DOB: September 20, 1941  Age: 79 y.o. MRN: [id number]  CC:  No chief complaint on file.     HPI  HPI  [name] [name] is a 79 year old female patient of mine. She present today for follow up on chronic conditions. She denies having issue or concerns today in the office. She reports sleeping well. Denies having changes in chewing, swallow or appetite changes. She denies having bowel or bladder habits. She denies falls or injury. She denies having hearing or vision changes. She reports dry skin at times which she uses lotion for. Reports forgetfulness when asked about memory.   Reports taking all her medications as directed and without issue. She denies chest pain, leg swelling, palpitations, cough.  Reports mild shortness of breath with exertion which she states is nothing new.   Today patient denies signs and symptoms of COVID 19 infection including fever, chills, cough, shortness of breath, and headache. Past Medical, Surgical, Social History, Allergies, and Medications have been Reviewed.   Past Medical History:  Diagnosis Date  . Anemia   . Anxiety   . Arthritis   . BIPOLAR AFFECTIVE DISORDER 05/11/2009   Qualifier: Diagnosis of  By: Orville Govern CMA, Arbie Cookey    . Bipolar affective disorder ([place])   . Bipolar disorder ([place])   . Cataract   . Chest pain 05/11/2009   Qualifier: Diagnosis of  By: Orville Govern CMA, Arbie Cookey    . Chronic back pain   . Chronic cough 11/23/2014  . Chronic fatigue   . Chronic pain syndrome 03/05/2013  . Complication of anesthesia   . Constipation   . Depression   . Difficulty in walking(719.7) 07/26/2011  . Dizziness   . Dyslipidemia   . Fecal incontinence 11/23/2014  . Fibromyalgia   . GERD (gastroesophageal reflux disease)   . H/O anxiety disorder 03/05/2013   Overview:  Heart Cath x 2 per patient; most recent 4 yrs ago with "negative" findings per patient; Cardiologist, Dr Dorris Carnes following; last seen 10/2011;  most recent episode 11/2012 with admit Forestine Na [facility]) and negative work-up per patient   . H/O: hysterectomy   . History of back surgery 03/05/2013  . History of gastroesophageal reflux (GERD) 03/05/2013  . History of kidney stones   . Hx of adenomatous colonic polyps 2005  . Hyperlipidemia   . Hypertension   . Hypothyroidism   . Knee pain   . Midsternal chest pain 08/19/2015  . Muscle weakness (generalized) 04/27/2013  . Non-obstructive CAD    a. 08/2015 Cath: LM 40, LCX small, 30ost, 27m, OM2 small, RCA nl/small, EF 55-65%.  . Osteopenia   . PONV (postoperative nausea and vomiting)   . Primary osteoarthritis of left knee 04/17/2020  . Scalp laceration 03/01/2020  . Sciatica     Current Meds  Medication Sig  . ALPRAZolam (XANAX) 0.5 MG tablet Take 0.25-0.5 mg by mouth daily as needed for anxiety.   Marland Kitchen aspirin EC 81 MG tablet Take 1 tablet (81 mg total) by mouth 2 (two) times daily.  Marland Kitchen atorvastatin (LIPITOR) 40 MG tablet TAKE (1) TABLET BY MOUTH AT BEDTIME.  . bisacodyl (WOMENS LAXATIVE) 5 MG EC tablet Take 1 tablet by mouth daily as needed for mild constipation (constipation).   . Calcium Carb-Cholecalciferol (CALCIUM/VITAMIN D PO) Take 1 tablet by mouth daily.  . Cholecalciferol (VITAMIN D) 50 MCG (2000 UT) CAPS Take 2,000 Units by mouth daily.  . Cimetidine (TAGAMET  PO) Take 1 tablet by mouth daily.   . DULoxetine (CYMBALTA) 60 MG capsule Take 120 mg by mouth daily.   Marland Kitchen estradiol (ESTRACE) 1 MG tablet TAKE 1/2 TABLET BY MOUTH DAILY. (Patient taking differently: Take 0.5 mg by mouth daily. )  . lamoTRIgine (LAMICTAL) 150 MG tablet Take 150 mg by mouth at bedtime.  Marland Kitchen levothyroxine (SYNTHROID) 25 MCG tablet Take 1 tablet (25 mcg total) by mouth daily.  Marland Kitchen losartan (COZAAR) 50 MG tablet TAKE (1) TABLET BY MOUTH ONCE DAILY.  . meloxicam (MOBIC) 7.5 MG tablet Take 1 tablet (7.5 mg total) by mouth 2 (two) times daily as needed for pain.  . Multiple Vitamin (MULTIVITAMIN) tablet Take 1  tablet by mouth daily.    . niacin (SLO-NIACIN) 500 MG tablet Take 500 mg by mouth daily.   . nitroGLYCERIN (NITROSTAT) 0.4 MG SL tablet PLACE 1 TAB UNDER TONGUE EVERY 5 MIN IF NEEDED FOR CHEST PAIN. MAY USE 3 TIMES.NO RELIEF CALL 911. (Patient taking differently: Place 0.4 mg under the tongue every 5 (five) minutes as needed for chest pain. PLACE 1 TAB UNDER TONGUE EVERY 5 MIN IF NEEDED FOR CHEST PAIN. MAY USE 3 TIMES.NO RELIEF CALL 911.)  . Omega-3 Fatty Acids (FISH OIL) 1000 MG CAPS Take 1,000 mg by mouth daily.   . Probiotic Product (PROBIOTIC DAILY PO) Take 1 tablet by mouth daily.   . risperiDONE (RISPERDAL) 1 MG tablet Take 0.5 mg by mouth at bedtime.   . WELLBUTRIN XL 300 MG 24 hr tablet Take 300 mg by mouth daily.     ROS:  Review of Systems  Constitutional: Negative.   HENT: Negative.   Eyes: Negative.   Respiratory: Negative.   Cardiovascular: Negative.   Gastrointestinal: Negative.   Genitourinary: Negative.   Musculoskeletal: Negative.   Skin: Negative.   Neurological: Negative.   Endo/Heme/Allergies: Negative.   Psychiatric/Behavioral: Negative.      Objective:   Today's Vitals: BP 110/70 (BP Location: Left Arm, Patient Position: Sitting, Cuff Size: Normal)   Pulse 72   Temp (!) 97.5 F (36.4 C) (Tympanic)   Ht 4\' 10"  (1.473 m)   Wt 115 lb (52.2 kg)   LMP 02/10/1993   SpO2 95%   BMI 24.04 kg/m  Vitals with BMI 08/31/2020 07/21/2020 07/19/2020  Height 4\' 10"  4\' 9"  4\' 8"   Weight 115 lbs 119 lbs 2 oz 118 lbs  BMI 24.04 88.41 66.06  Systolic 301 601 -  Diastolic 70 72 -  Pulse 72 103 -     Physical Exam Vitals and nursing note reviewed.  Constitutional:      Appearance: Normal appearance. She is well-developed, well-groomed and normal weight.  HENT:     Head: Normocephalic and atraumatic.     Right Ear: External ear normal.     Left Ear: External ear normal.     Mouth/Throat:     Comments: Mask in place  Eyes:     General:        Right eye: No  discharge.        Left eye: No discharge.     Conjunctiva/sclera: Conjunctivae normal.  Cardiovascular:     Rate and Rhythm: Normal rate and regular rhythm.     Pulses: Normal pulses.     Heart sounds: Normal heart sounds.  Pulmonary:     Effort: Pulmonary effort is normal.     Breath sounds: Normal breath sounds.  Musculoskeletal:        General: Normal range of  motion.     Cervical back: Normal range of motion and neck supple.  Skin:    General: Skin is warm.  Neurological:     General: No focal deficit present.     Mental Status: She is alert and oriented to person, place, and time.  Psychiatric:        Attention and Perception: Attention and perception normal.        Mood and Affect: Mood and affect normal.        Speech: Speech normal.        Behavior: Behavior normal. Behavior is cooperative.        Thought Content: Thought content normal.        Cognition and Memory: Cognition and memory normal.        Judgment: Judgment normal.     Comments: Pleasant in conversation and good eye contact     Assessment   1. Primary hypertension   2. Acquired hypothyroidism   3. Bipolar affective disorder, current episode hypomanic ([place])     Tests ordered No orders of the defined types were placed in this encounter.    Plan: Please see assessment and plan per problem list above.   No orders of the defined types were placed in this encounter.   Patient to follow-up in 01/25/2021  Note: This dictation was prepared with Dragon dictation along with smaller phrase technology. Similar sounding words can be transcribed inadequately or may not be corrected upon review. Any transcriptional errors that result from this process are unintentional.      Perlie Mayo, NP

## 2020-08-31 NOTE — Patient Instructions (Signed)
°  HAPPY FALL!  I appreciate the opportunity to provide you with care for your health and wellness.  Follow up: March for CPE   No labs or referrals today  Have a wonderful Holiday Season! Stay safe and healthy!  Please continue to practice social distancing to keep you, your family, and our community safe.  If you must go out, please wear a mask and practice good handwashing.  It was a pleasure to see you and I look forward to continuing to work together on your health and well-being. Please do not hesitate to call the office if you need care or have questions about your care.  Have a wonderful day and week. With Gratitude, Cherly Beach, DNP, AGNP-BC

## 2020-08-31 NOTE — Assessment & Plan Note (Signed)
Will get updated labs at next appt. No S&S today in office.

## 2020-09-15 ENCOUNTER — Telehealth (INDEPENDENT_AMBULATORY_CARE_PROVIDER_SITE_OTHER): Payer: Medicare PPO | Admitting: Family Medicine

## 2020-09-15 ENCOUNTER — Encounter: Payer: Self-pay | Admitting: Family Medicine

## 2020-09-15 ENCOUNTER — Other Ambulatory Visit: Payer: Self-pay

## 2020-09-15 DIAGNOSIS — Z1211 Encounter for screening for malignant neoplasm of colon: Secondary | ICD-10-CM

## 2020-09-15 DIAGNOSIS — Z0000 Encounter for general adult medical examination without abnormal findings: Secondary | ICD-10-CM | POA: Diagnosis not present

## 2020-09-15 DIAGNOSIS — Z1231 Encounter for screening mammogram for malignant neoplasm of breast: Secondary | ICD-10-CM

## 2020-09-15 NOTE — Patient Instructions (Addendum)
[name] [name] , Thank you for taking time to come for your Medicare Wellness Visit. I appreciate your ongoing commitment to your health goals. Please review the following plan we discussed and let me know if I can assist you in the future.   Screening recommendations/referrals: Colonoscopy: Due; referral sent  Mammogram: Due; referral sent  Bone Density: Completed   Recommended yearly ophthalmology/optometry visit for glaucoma screening and checkup Recommended yearly dental visit for hygiene and checkup  Vaccinations: Influenza vaccine: Complete Pneumococcal vaccine: Complete  Tdap vaccine: Complete Shingles vaccine: Complete     Advanced directives: Living Will   Conditions/risks identified: None   Next appointment: 01/25/2021 @ 9:00 am with Cherly Beach, NP    Preventive Care 53 Years and Older, Female Preventive care refers to lifestyle choices and visits with your health care provider that can promote health and wellness. What does preventive care include?  A yearly physical exam. This is also called an annual well check.  Dental exams once or twice a year.  Routine eye exams. Ask your health care provider how often you should have your eyes checked.  Personal lifestyle choices, including:  Daily care of your teeth and gums.  Regular physical activity.  Eating a healthy diet.  Avoiding tobacco and drug use.  Limiting alcohol use.  Practicing safe sex.  Taking low-dose aspirin every day.  Taking vitamin and mineral supplements as recommended by your health care provider. What happens during an annual well check? The services and screenings done by your health care provider during your annual well check will depend on your age, overall health, lifestyle risk factors, and family history of disease. Counseling  Your health care provider may ask you questions about your:  Alcohol use.  Tobacco use.  Drug use.  Emotional well-being.  Home and relationship  well-being.  Sexual activity.  Eating habits.  History of falls.  Memory and ability to understand (cognition).  Work and work Statistician.  Reproductive health. Screening  You may have the following tests or measurements:  Height, weight, and BMI.  Blood pressure.  Lipid and cholesterol levels. These may be checked every 5 years, or more frequently if you are over 93 years old.  Skin check.  Lung cancer screening. You may have this screening every year starting at age 78 if you have a 30-pack-year history of smoking and currently smoke or have quit within the past 15 years.  Fecal occult blood test (FOBT) of the stool. You may have this test every year starting at age 55.  Flexible sigmoidoscopy or colonoscopy. You may have a sigmoidoscopy every 5 years or a colonoscopy every 10 years starting at age 46.  Hepatitis C blood test.  Hepatitis B blood test.  Sexually transmitted disease (STD) testing.  Diabetes screening. This is done by checking your blood sugar (glucose) after you have not eaten for a while (fasting). You may have this done every 1-3 years.  Bone density scan. This is done to screen for osteoporosis. You may have this done starting at age 80.  Mammogram. This may be done every 1-2 years. Talk to your health care provider about how often you should have regular mammograms. Talk with your health care provider about your test results, treatment options, and if necessary, the need for more tests. Vaccines  Your health care provider may recommend certain vaccines, such as:  Influenza vaccine. This is recommended every year.  Tetanus, diphtheria, and acellular pertussis (Tdap, Td) vaccine. You may need a Td  booster every 10 years.  Zoster vaccine. You may need this after age 38.  Pneumococcal 13-valent conjugate (PCV13) vaccine. One dose is recommended after age 12.  Pneumococcal polysaccharide (PPSV23) vaccine. One dose is recommended after age  53. Talk to your health care provider about which screenings and vaccines you need and how often you need them. This information is not intended to replace advice given to you by your health care provider. Make sure you discuss any questions you have with your health care provider. Document Released: 11/25/2015 Document Revised: 07/18/2016 Document Reviewed: 08/30/2015 Elsevier Interactive Patient Education  2017 [place] Prevention in the Home Falls can cause injuries. They can happen to people of all ages. There are many things you can do to make your home safe and to help prevent falls. What can I do on the outside of my home?  Regularly fix the edges of walkways and driveways and fix any cracks.  Remove anything that might make you trip as you walk through a door, such as a raised step or threshold.  Trim any bushes or trees on the path to your home.  Use bright outdoor lighting.  Clear any walking paths of anything that might make someone trip, such as rocks or tools.  Regularly check to see if handrails are loose or broken. Make sure that both sides of any steps have handrails.  Any raised decks and porches should have guardrails on the edges.  Have any leaves, snow, or ice cleared regularly.  Use sand or salt on walking paths during winter.  Clean up any spills in your garage right away. This includes oil or grease spills. What can I do in the bathroom?  Use night lights.  Install grab bars by the toilet and in the tub and shower. Do not use towel bars as grab bars.  Use non-skid mats or decals in the tub or shower.  If you need to sit down in the shower, use a plastic, non-slip stool.  Keep the floor dry. Clean up any water that spills on the floor as soon as it happens.  Remove soap buildup in the tub or shower regularly.  Attach bath mats securely with double-sided non-slip rug tape.  Do not have throw rugs and other things on the floor that can make  you trip. What can I do in the bedroom?  Use night lights.  Make sure that you have a light by your bed that is easy to reach.  Do not use any sheets or blankets that are too big for your bed. They should not hang down onto the floor.  Have a firm chair that has side arms. You can use this for support while you get dressed.  Do not have throw rugs and other things on the floor that can make you trip. What can I do in the kitchen?  Clean up any spills right away.  Avoid walking on wet floors.  Keep items that you use a lot in easy-to-reach places.  If you need to reach something above you, use a strong step stool that has a grab bar.  Keep electrical cords out of the way.  Do not use floor polish or wax that makes floors slippery. If you must use wax, use non-skid floor wax.  Do not have throw rugs and other things on the floor that can make you trip. What can I do with my stairs?  Do not leave any items on the stairs.  Make sure that there are handrails on both sides of the stairs and use them. Fix handrails that are broken or loose. Make sure that handrails are as long as the stairways.  Check any carpeting to make sure that it is firmly attached to the stairs. Fix any carpet that is loose or worn.  Avoid having throw rugs at the top or bottom of the stairs. If you do have throw rugs, attach them to the floor with carpet tape.  Make sure that you have a light switch at the top of the stairs and the bottom of the stairs. If you do not have them, ask someone to add them for you. What else can I do to help prevent falls?  Wear shoes that:  Do not have high heels.  Have rubber bottoms.  Are comfortable and fit you well.  Are closed at the toe. Do not wear sandals.  If you use a stepladder:  Make sure that it is fully opened. Do not climb a closed stepladder.  Make sure that both sides of the stepladder are locked into place.  Ask someone to hold it for you, if  possible.  Clearly mark and make sure that you can see:  Any grab bars or handrails.  First and last steps.  Where the edge of each step is.  Use tools that help you move around (mobility aids) if they are needed. These include:  Canes.  Walkers.  Scooters.  Crutches.  Turn on the lights when you go into a dark area. Replace any light bulbs as soon as they burn out.  Set up your furniture so you have a clear path. Avoid moving your furniture around.  If any of your floors are uneven, fix them.  If there are any pets around you, be aware of where they are.  Review your medicines with your doctor. Some medicines can make you feel dizzy. This can increase your chance of falling. Ask your doctor what other things that you can do to help prevent falls. This information is not intended to replace advice given to you by your health care provider. Make sure you discuss any questions you have with your health care provider. Document Released: 08/25/2009 Document Revised: 04/05/2016 Document Reviewed: 12/03/2014 Elsevier Interactive Patient Education  2017 [employer].

## 2020-09-15 NOTE — Progress Notes (Addendum)
Subjective:   [name] [name] is a 79 y.o. female who presents for Medicare Annual (Subsequent) preventive examination.     Method of visit: Telephone  Location of Patient: Home Location of Provider: Office Consent was obtain for visit over the telephone. Services rendered by provider: Visit was performed via telephone  I verified that I am speaking with the correct person using two identifiers.      Objective:    Today's Vitals   09/15/20 1337  PainSc: 0-No pain   There is no height or weight on file to calculate BMI.  Advanced Directives 09/15/2020 04/18/2020 04/17/2020 04/14/2020 01/19/2020 01/14/2020 09/12/2017  Does Patient Have a Medical Advance Directive? No;Yes Yes Yes Yes Yes No Yes  Type of Advance Directive Living will [place];Living will - [place];Living will [place];Living will - [place];Living will  Does patient want to make changes to medical advance directive? - No - Patient declined - No - Patient declined - - -  Copy of [place] in Chart? - No - copy requested - - - - No - copy requested  Would patient like information on creating a medical advance directive? No - Patient declined - - - - No - Patient declined -  Pre-existing out of facility DNR order (yellow form or pink MOST form) - - - - - - -    Current Medications (verified) Outpatient Encounter Medications as of 09/15/2020  Medication Sig  . ALPRAZolam (XANAX) 0.5 MG tablet Take 0.25-0.5 mg by mouth daily as needed for anxiety.   Marland Kitchen aspirin EC 81 MG tablet Take 1 tablet (81 mg total) by mouth 2 (two) times daily.  Marland Kitchen atorvastatin (LIPITOR) 40 MG tablet TAKE (1) TABLET BY MOUTH AT BEDTIME.  . bisacodyl (WOMENS LAXATIVE) 5 MG EC tablet Take 1 tablet by mouth daily as needed for mild constipation (constipation).   . Calcium Carb-Cholecalciferol (CALCIUM/VITAMIN D PO) Take 1 tablet by mouth daily.  . Cholecalciferol  (VITAMIN D) 50 MCG (2000 UT) CAPS Take 2,000 Units by mouth daily.  . Cimetidine (TAGAMET PO) Take 1 tablet by mouth daily.   . DULoxetine (CYMBALTA) 60 MG capsule Take 120 mg by mouth daily.   Marland Kitchen estradiol (ESTRACE) 1 MG tablet TAKE 1/2 TABLET BY MOUTH DAILY. (Patient taking differently: Take 0.5 mg by mouth daily. )  . lamoTRIgine (LAMICTAL) 150 MG tablet Take 150 mg by mouth at bedtime.  Marland Kitchen levothyroxine (SYNTHROID) 25 MCG tablet Take 1 tablet (25 mcg total) by mouth daily.  Marland Kitchen losartan (COZAAR) 50 MG tablet TAKE (1) TABLET BY MOUTH ONCE DAILY.  . meloxicam (MOBIC) 7.5 MG tablet Take 1 tablet (7.5 mg total) by mouth 2 (two) times daily as needed for pain.  . Multiple Vitamin (MULTIVITAMIN) tablet Take 1 tablet by mouth daily.    . niacin (SLO-NIACIN) 500 MG tablet Take 500 mg by mouth daily.   . nitroGLYCERIN (NITROSTAT) 0.4 MG SL tablet PLACE 1 TAB UNDER TONGUE EVERY 5 MIN IF NEEDED FOR CHEST PAIN. MAY USE 3 TIMES.NO RELIEF CALL 911. (Patient taking differently: Place 0.4 mg under the tongue every 5 (five) minutes as needed for chest pain. PLACE 1 TAB UNDER TONGUE EVERY 5 MIN IF NEEDED FOR CHEST PAIN. MAY USE 3 TIMES.NO RELIEF CALL 911.)  . Omega-3 Fatty Acids (FISH OIL) 1000 MG CAPS Take 1,000 mg by mouth daily.   . Probiotic Product (PROBIOTIC DAILY PO) Take 1 tablet  by mouth daily.   . risperiDONE (RISPERDAL) 1 MG tablet Take 0.5 mg by mouth at bedtime.   . WELLBUTRIN XL 300 MG 24 hr tablet Take 300 mg by mouth daily.    No facility-administered encounter medications on file as of 09/15/2020.    Allergies (verified) Hydrocodone-acetaminophen   History: Past Medical History:  Diagnosis Date  . Anemia   . Anxiety   . Arthritis   . BIPOLAR AFFECTIVE DISORDER 05/11/2009   Qualifier: Diagnosis of  By: Orville Govern CMA, Arbie Cookey    . Bipolar affective disorder ([place])   . Bipolar disorder ([place])   . Cataract   . Chest pain 05/11/2009   Qualifier: Diagnosis of  By: Orville Govern CMA, Arbie Cookey    . Chronic  back pain   . Chronic cough 11/23/2014  . Chronic fatigue   . Chronic pain syndrome 03/05/2013  . Complication of anesthesia   . Constipation   . Depression   . Difficulty in walking(719.7) 07/26/2011  . Dizziness   . Dyslipidemia   . Fecal incontinence 11/23/2014  . Fibromyalgia   . GERD (gastroesophageal reflux disease)   . H/O anxiety disorder 03/05/2013   Overview:  Heart Cath x 2 per patient; most recent 4 yrs ago with "negative" findings per patient; Cardiologist, Dr Dorris Carnes following; last seen 10/2011; most recent episode 11/2012 with admit Forestine Na [facility]) and negative work-up per patient   . H/O: hysterectomy   . History of back surgery 03/05/2013  . History of gastroesophageal reflux (GERD) 03/05/2013  . History of kidney stones   . Hx of adenomatous colonic polyps 2005  . Hyperlipidemia   . Hypertension   . Hypothyroidism   . Knee pain   . Midsternal chest pain 08/19/2015  . Muscle weakness (generalized) 04/27/2013  . Non-obstructive CAD    a. 08/2015 Cath: LM 40, LCX small, 30ost, 51m OM2 small, RCA nl/small, EF 55-65%.  . Osteopenia   . PONV (postoperative nausea and vomiting)   . Primary osteoarthritis of left knee 04/17/2020  . Scalp laceration 03/01/2020  . Sciatica    Past Surgical History:  Procedure Laterality Date  . ABDOMINAL HYSTERECTOMY  1994   TAH- DUB, BSO  . [place] bunionectomy     bilateral  . BREAST BIOPSY Left 1989  . BREAST LUMPECTOMY    . BREAST SURGERY N/A    Phreesia 09/14/2020  . BUNIONECTOMY Left 2005  . CARDIAC CATHETERIZATION  2010   +CAD  . CARDIAC CATHETERIZATION N/A 08/19/2015   Procedure: Left Heart Cath and Coronary Angiography;  Surgeon: HBelva Crome MD;  [facility];  Service: Cardiovascular;  Laterality: N/A;  . 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  Remainder of colonic mucosa was normal  . COLONOSCOPY WITH  ESOPHAGOGASTRODUODENOSCOPY (EGD) N/A 02/01/2014   RBSJ:GGEZerosive reflux/gastric polyp/normal rectum/scattered left sided diverticula, normal distal TI. gastric bx negative, fundic gland polyp. next TCS 01/2019.  .Marland KitchenCYSTECTOMY     pilonidial cyst  . ESOPHAGOGASTRODUODENOSCOPY     followed by colonoscopy with snare polypectomy  . EYE SURGERY Bilateral    cataract surgery with lens implants  . JOINT REPLACEMENT N/A    Phreesia 09/14/2020  . REVISION TOTAL HIP ARTHROPLASTY Right   . right knee replacement  02/2013  . SHOULDER ARTHROSCOPY Left 2005  . SHOULDER ARTHROSCOPY Right   . SPINAL FUSION    . SPINAL FUSION  2004   L4-L5  . SPINE  SURGERY N/A    Phreesia 09/14/2020  . TOTAL HIP ARTHROPLASTY Right 06/2006  . TOTAL HIP ARTHROPLASTY  8/12  . TOTAL KNEE ARTHROPLASTY Left 04/18/2020  . TOTAL KNEE ARTHROPLASTY Left 04/18/2020   Procedure: LEFT TOTAL KNEE ARTHROPLASTY;  Surgeon: Leandrew Koyanagi, MD;  Location: [place];  Service: Orthopedics;  Laterality: Left;  . TOTAL SHOULDER REPLACEMENT Left 08/2005   Family History  Problem Relation Age of Onset  . Heart disease Mother   . Heart attack Mother   . Heart disease Father   . Heart attack Father   . Stroke Sister   . Stroke Maternal Grandfather   . Cancer Paternal Grandmother        unknown primary  . Colon cancer Neg Hx    Social History   Socioeconomic History  . Marital status: Widowed    Spouse name: Passed in Jan 2013  . Number of children: 2  . Years of education: Not on file  . Highest education level: Master's degree (e.g., MA, MS, MEng, MEd, MSW, MBA)  Occupational History  . 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 Smoking status: Never Smoker  . Smokeless tobacco: Never Used  Vaping Use  . Vaping Use: Never used  Substance and Sexual Activity  . Alcohol use: Yes    Alcohol/week: 7.0 standard drinks    Types: 7 Glasses of wine per week    Comment: almost daily    . Drug use: No  . Sexual activity: Not Currently    Partners: Male    Birth control/protection: Surgical    Comment: TAH  Other Topics Concern  . Not on file  Social History Narrative   Lives alone   2 children, 9 grandchildren, 5 great grand   One in [place] and another [place] to [place]      Enjoy: reading;       Diet: eats all food groups    Caffeine: 2 cups of coffee, some tea   Water: 6-8 cups day        Wears seat belt    Does not use phone while driving   Oceanographer at home   No weapons    Social Determinants of Health   Financial Resource Strain: Low Risk   . Difficulty of Paying Living Expenses: Not very hard  Food Insecurity: No Food Insecurity  . Worried About Charity fundraiser in the Last Year: Never true  . Ran Out of Food in the Last Year: Never true  Transportation Needs: No Transportation Needs  . Lack of Transportation (Medical): No  . Lack of Transportation (Non-Medical): No  Physical Activity: Sufficiently Active  . Days of Exercise per Week: 5 days  . Minutes of Exercise per Session: 60 min  Stress: No Stress Concern Present  . Feeling of Stress : Not at all  Social Connections: Socially Isolated  . Frequency of Communication with Friends and Family: Three times a week  . Frequency of Social Gatherings with Friends and Family: Twice a week  . Attends Religious Services: Never  . Active Member of Clubs or Organizations: No  . Attends Archivist Meetings: Never  . Marital Status: Widowed    Tobacco Counseling Counseling given: Not Answered   Pre-visit preparation completed: Yes  Pain : No/denies pain Pain Score: 0-No pain     Diabetes: No  How often do you need to have someone help you  when you read instructions, pamphlets, or other written materials from your doctor or pharmacy?: 1 - Never What is the last grade level you completed in school?: Master's Degree - Child development and Family Relations  Diabetic? No    Interpreter Needed?: No      Activities of Daily Living In your present state of health, do you have any difficulty performing the following activities: 09/15/2020 04/18/2020  Hearing? N N  Comment - -  Vision? N N  Comment - -  Difficulty concentrating or making decisions? N N  Walking or climbing stairs? N Y  Comment - -  Dressing or bathing? N N  Doing errands, shopping? N -  Preparing Food and eating ? N -  Using the Toilet? N -  In the past six months, have you accidently leaked urine? N -  Do you have problems with loss of bowel control? N -  Managing your Medications? N -  Managing your Finances? N -  Housekeeping or managing your Housekeeping? N -  Some recent data might be hidden    Patient Care Team: Perlie Mayo, NP as PCP - General (Family Medicine) Fay Records, MD as PCP - Cardiology (Cardiology) Gala Romney Cristopher Estimable, MD as Consulting Physician (Gastroenterology)  Indicate any recent Medical Services you may have received from other than [facility] providers in the past year (date may be approximate).     Assessment:   This is a routine wellness examination for [place].  Hearing/Vision screen No exam data present  Dietary issues and exercise activities discussed: Current Exercise Habits: Home exercise routine, Type of exercise: walking, Time (Minutes): 60, Frequency (Times/Week): 5, Weekly Exercise (Minutes/Week): 300, Intensity: Mild  Goals    . Social and Functional Skills Optimized     Evidence-based guidance:  Assess level of social support; promote maintaining links with family, friends and community to reduce social isolation.  Assess level of function related to basic activities of daily living that include eating, dressing, bathing, as well as instrumental activities of daily living such as shopping, managing finances and use of devices.  Encourage continuation of daily life components such as self-care, home maintenance, financial management, volunteer  activities, education opportunities and hobbies.  Refer to occupational or physical therapy to develop comprehensive rehabilitation plan to improve or maintain activities of daily living; consider inclusion of endurance, balance and resistance-training.  Consider complementary therapy such as yoga, music, gardening, outdoor activities, aromatherapy and tai chi.   Notes: "Getting through the holiday's"       Depression Screen PHQ 2/9 Scores 09/15/2020 09/15/2020 08/31/2020 07/21/2020 06/01/2020 03/01/2020 01/05/2020  PHQ - 2 Score 0 0 1 0 0 0 1    Fall Risk Fall Risk  09/15/2020 08/31/2020 07/21/2020 06/01/2020 03/01/2020  Falls in the past year? 0 0 0 1 0  Number falls in past yr: 0 0 0 1 0  Injury with Fall? 0 0 0 0 0  Risk for fall due to : No Fall Risks No Fall Risks No Fall Risks - -  Follow up Falls evaluation completed Falls evaluation completed Falls evaluation completed - Falls evaluation completed    Any stairs in or around the home? Yes  If so, are there any without handrails? Yes  Home free of loose throw rugs in walkways, pet beds, electrical cords, etc? Yes  Adequate lighting in your home to reduce risk of falls? Yes   ASSISTIVE DEVICES UTILIZED TO PREVENT FALLS:  Life alert? No  Use of a  cane, walker or w/c? No  Grab bars in the bathroom? No  Shower chair or bench in shower? No  Elevated toilet seat or a handicapped toilet? No   TIMED UP AND GO:  Was the test performed? No .     Cognitive Function:     6CIT Screen 09/15/2020  What Year? 0 points  What month? 0 points  What time? 0 points  Count back from 20 0 points  Months in reverse 0 points  Repeat phrase 0 points  Total Score 0    Immunizations Immunization History  Administered Date(s) Administered  . Fluad Quad(high Dose 65+) 07/21/2020  . Influenza-Unspecified 07/22/2018, 07/07/2019  . Moderna SARS-COVID-2 Vaccination 12/24/2019, 01/22/2020  . Pneumococcal Conjugate-13 07/13/2014  . Pneumococcal  Polysaccharide-23 07/31/2017  . Tdap 11/12/2012, 01/14/2020  . Zoster Recombinat (Shingrix) 05/27/2018, 08/20/2018    TDAP status: Up to date Flu Vaccine status: Up to date Pneumococcal vaccine status: Up to date Covid-19 vaccine status: Completed vaccines  Qualifies for Shingles Vaccine? Yes   Zostavax completed Yes   Shingrix Completed?: Yes  Screening Tests Health Maintenance  Topic Date Due  . TETANUS/TDAP  01/13/2030  . INFLUENZA VACCINE  Completed  . DEXA SCAN  Completed  . COVID-19 Vaccine  Completed  . Hepatitis C Screening  Completed  . PNA vac Low Risk Adult  Completed    Health Maintenance  There are no preventive care reminders to display for this patient.  Colorectal cancer screening: Completed normal. Repeat every 5 years Mammogram: Due  Bone Density status: Completed normal. Results reflect: Bone density results: NORMAL. Repeat every 0 years.  Lung Cancer Screening: (Low Dose CT Chest recommended if Age 46-80 years, 30 pack-year currently smoking OR have quit w/in 15years.) does not qualify.    Additional Screening:  Hepatitis C Screening: does qualify; Completed.   Vision Screening: Recommended annual ophthalmology exams for early detection of glaucoma and other disorders of the eye. Is the patient up to date with their annual eye exam?  Yes    Who is the provider or what is the name of the office in which the patient attends annual eye exams? Dr. Gershon Crane   If pt is not established with a provider, would they like to be referred to a provider to establish care? No .   Dental Screening: Recommended annual dental exams for proper oral hygiene  Community Resource Referral / Chronic Care Management: CRR required this visit?  No   CCM required this visit?  No      Plan:     1. Encounter for Medicare annual wellness exam  2. Screening for malignant neoplasm of colon  - Ambulatory referral to Gastroenterology  3. Screening mammogram for breast  cancer  - MM Digital Screening; Future   I have personally reviewed and noted the following in the patient's chart:   . Medical and social history . Use of alcohol, tobacco or illicit drugs  . Current medications and supplements . Functional ability and status . Nutritional status . Physical activity . Advanced directives . List of other physicians . Hospitalizations, surgeries, and ER visits in previous 12 months . Vitals . Screenings to include cognitive, depression, and falls . Referrals and appointments  In addition, I have reviewed and discussed with patient certain preventive protocols, quality metrics, and best practice recommendations. A written personalized care plan for preventive services as well as general preventive health recommendations were provided to patient.     Perlie Mayo, NP  09/15/2020   Nurse Notes: AWV conducted over the phone with pt consent to televisit via audio. Phone visit took approx 20 minutes to complete while pt was in the home and provider off site at the time of call. Pt requests referrals for mammogram and colonoscopy.

## 2020-09-19 ENCOUNTER — Other Ambulatory Visit: Payer: Self-pay | Admitting: Family Medicine

## 2020-09-19 DIAGNOSIS — E039 Hypothyroidism, unspecified: Secondary | ICD-10-CM

## 2020-09-20 ENCOUNTER — Encounter: Payer: Self-pay | Admitting: Internal Medicine

## 2020-09-21 ENCOUNTER — Other Ambulatory Visit: Payer: Self-pay

## 2020-09-21 DIAGNOSIS — E039 Hypothyroidism, unspecified: Secondary | ICD-10-CM

## 2020-09-21 MED ORDER — LEVOTHYROXINE SODIUM 25 MCG PO TABS: 25.0000 ug | ORAL_TABLET | Freq: Every day | ORAL | 2 refills | Status: DC

## 2020-10-18 ENCOUNTER — Ambulatory Visit (INDEPENDENT_AMBULATORY_CARE_PROVIDER_SITE_OTHER): Payer: Medicare PPO

## 2020-10-18 ENCOUNTER — Encounter: Payer: Self-pay | Admitting: Orthopaedic Surgery

## 2020-10-18 ENCOUNTER — Ambulatory Visit: Payer: Medicare PPO | Admitting: Orthopaedic Surgery

## 2020-10-18 VITALS — Ht <= 58 in | Wt 115.0 lb

## 2020-10-18 DIAGNOSIS — Z96652 Presence of left artificial knee joint: Secondary | ICD-10-CM | POA: Diagnosis not present

## 2020-10-18 MED ORDER — MELOXICAM 7.5 MG PO TABS: 7.5000 mg | ORAL_TABLET | Freq: Two times a day (BID) | ORAL | 6 refills | Status: AC | PRN

## 2020-10-18 NOTE — Progress Notes (Signed)
Post-Op Visit Note   Patient: [name] [name]           Date of Birth: 03-Jul-1941           MRN: [id number] Visit Date: 10/18/2020 PCP: Perlie Mayo, NP   Assessment & Plan:  Visit Diagnoses:  1. Status post total left knee replacement     Plan:   Vaughan Basta 6 months status post left total knee replacement.  Doing well in that regard.  Has no complaints other than some occasional start of stiffness.  Surgical scar is fully healed.  Excellent range of motion.  Stable to varus valgus.  X-rays demonstrate stable knee replacement without complications.  Dental prophylaxis reinforced.  She is doing very well for her 63-month appointment.  Activity as tolerated.  Meloxicam refilled for her back.  Follow-up in 6 months with two-view x-rays of the left knee for her 1 year visit.  Follow-Up Instructions: Return in about 6 months (around 04/18/2021).   Orders:  Orders Placed This Encounter  Procedures  . XR Knee 1-2 Views Left   Meds ordered this encounter  Medications  . meloxicam (MOBIC) 7.5 MG tablet    Sig: Take 1 tablet (7.5 mg total) by mouth 2 (two) times daily as needed for pain.    Dispense:  180 tablet    Refill:  6    Imaging: XR Knee 1-2 Views Left  Result Date: 10/18/2020 Stable total knee replacement in good alignment.    PMFS History: Patient Active Problem List   Diagnosis Date Noted  . Encounter for examination following treatment at hospital 07/21/2020  . Need for immunization against influenza 07/21/2020  . Degenerative scoliosis in adult patient 07/21/2020  . Status post total left knee replacement 04/18/2020  . Abdominal pain, epigastric 08/14/2019  . Bipolar disorder ([place])   . Hypothyroidism 08/04/2015    Class: Diagnosis of  . Constipation 09/22/2013  . H/O adenomatous polyp of colon 09/22/2013  . Vitamin D deficiency 03/05/2013  . Insomnia 03/05/2013  . HTN (hypertension) 10/15/2011  . Hyperlipidemia 05/11/2009  . Coronary artery disease  05/11/2009  . GERD 05/11/2009   Past Medical History:  Diagnosis Date  . Anemia   . Anxiety   . Arthritis   . BIPOLAR AFFECTIVE DISORDER 05/11/2009   Qualifier: Diagnosis of  By: Orville Govern CMA, Arbie Cookey    . Bipolar affective disorder ([place])   . Bipolar disorder ([place])   . Cataract   . Chest pain 05/11/2009   Qualifier: Diagnosis of  By: Orville Govern CMA, Arbie Cookey    . Chronic back pain   . Chronic cough 11/23/2014  . Chronic fatigue   . Chronic pain syndrome 03/05/2013  . Complication of anesthesia   . Constipation   . Depression   . Difficulty in walking(719.7) 07/26/2011  . Dizziness   . Dyslipidemia   . Fecal incontinence 11/23/2014  . Fibromyalgia   . GERD (gastroesophageal reflux disease)   . H/O anxiety disorder 03/05/2013   Overview:  Heart Cath x 2 per patient; most recent 4 yrs ago with "negative" findings per patient; Cardiologist, Dr Dorris Carnes following; last seen 10/2011; most recent episode 11/2012 with admit Forestine Na [facility]) and negative work-up per patient   . H/O: hysterectomy   . History of back surgery 03/05/2013  . History of gastroesophageal reflux (GERD) 03/05/2013  . History of kidney stones   . Hx of adenomatous colonic polyps 2005  . Hyperlipidemia   . Hypertension   . Hypothyroidism   .  Knee pain   . Midsternal chest pain 08/19/2015  . Muscle weakness (generalized) 04/27/2013  . Non-obstructive CAD    a. 08/2015 Cath: LM 40, LCX small, 30ost, 39m, OM2 small, RCA nl/small, EF 55-65%.  . Osteopenia   . PONV (postoperative nausea and vomiting)   . Primary osteoarthritis of left knee 04/17/2020  . Scalp laceration 03/01/2020  . Sciatica     Family History  Problem Relation Age of Onset  . Heart disease Mother   . Heart attack Mother   . Heart disease Father   . Heart attack Father   . Stroke Sister   . Stroke Maternal Grandfather   . Cancer Paternal Grandmother        unknown primary  . Colon cancer Neg Hx     Past Surgical History:  Procedure Laterality  Date  . ABDOMINAL HYSTERECTOMY  1994   TAH- DUB, BSO  . [place] bunionectomy     bilateral  . BREAST BIOPSY Left 1989  . BREAST LUMPECTOMY    . BREAST SURGERY N/A    Phreesia 09/14/2020  . BUNIONECTOMY Left 2005  . CARDIAC CATHETERIZATION  2010   +CAD  . CARDIAC CATHETERIZATION N/A 08/19/2015   Procedure: Left Heart Cath and Coronary Angiography;  Surgeon: Belva Crome, MD;  Location: [place] [facility];  Service: Cardiovascular;  Laterality: N/A;  . COLONOSCOPY  01/29/2007   ZJQ:BHALPF rectum, left-sided diverticula, 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  Remainder of colonic mucosa was normal  . COLONOSCOPY WITH ESOPHAGOGASTRODUODENOSCOPY (EGD) N/A 02/01/2014   XTK:WIOX erosive reflux/gastric polyp/normal rectum/scattered left sided diverticula, normal distal TI. gastric bx negative, fundic gland polyp. next TCS 01/2019.  Marland Kitchen CYSTECTOMY     pilonidial cyst  . ESOPHAGOGASTRODUODENOSCOPY     followed by colonoscopy with snare polypectomy  . EYE SURGERY Bilateral    cataract surgery with lens implants  . JOINT REPLACEMENT N/A    Phreesia 09/14/2020  . REVISION TOTAL HIP ARTHROPLASTY Right   . right knee replacement  02/2013  . SHOULDER ARTHROSCOPY Left 2005  . SHOULDER ARTHROSCOPY Right   . SPINAL FUSION    . SPINAL FUSION  2004   L4-L5  . SPINE SURGERY N/A    Phreesia 09/14/2020  . TOTAL HIP ARTHROPLASTY Right 06/2006  . TOTAL HIP ARTHROPLASTY  8/12  . TOTAL KNEE ARTHROPLASTY Left 04/18/2020  . TOTAL KNEE ARTHROPLASTY Left 04/18/2020   Procedure: LEFT TOTAL KNEE ARTHROPLASTY;  Surgeon: Leandrew Koyanagi, MD;  Location: [place];  Service: Orthopedics;  Laterality: Left;  . TOTAL SHOULDER REPLACEMENT Left 08/2005   Social History   Occupational History  . 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 Smoking status: Never Smoker  . Smokeless tobacco: Never Used  Vaping Use  . Vaping Use: Never used  Substance and  Sexual Activity  . Alcohol use: Yes    Alcohol/week: 7.0 standard drinks    Types: 7 Glasses of wine per week    Comment: almost daily  . Drug use: No  . Sexual activity: Not Currently    Partners: Male    Birth control/protection: Surgical    Comment: TAH

## 2020-10-31 NOTE — Progress Notes (Signed)
Referring Provider: Perlie Mayo, NP Primary Care Physician:  Perlie Mayo, NP Primary GI Physician: Dr. Gala Romney  Chief Complaint  Patient presents with  . Colonoscopy    Due for tcs    HPI:   [name] [name] is a 79 y.o. female presenting today to discuss scheduling colonoscopy due to history of colon polyps.  Last colonoscopy in 2015 with scattered left-sided diverticula, otherwise normal exam, recommended repeat in 5 years.  Last EGD March 2015 with mild erosive reflux esophagitis, gastric polyp which was fundic gland.  Negative gastric biopsies.  She was last seen in our office 08/14/2019 discuss scheduling surveillance colonoscopy as well.  She reported having some central abdominal pain, nausea without vomiting.  Queried whether this was related to Mobic.  She had come off of PPI therapy at some point as she was concerned about long-term use.  Heartburn 2-3 times a week taking Alka-Seltzer as needed.  Had recently started Tagamet daily over-the-counter.  Constipation managed with bisacodyl twice per week.  Plan for EGD and colonoscopy.  Patient called to cancel her procedure due to snow.  Today: Intermittent dysphagia symptoms that have developed over this last year. Occurs almost every day, especially when taking pills. Meats and breads will also cause her some trouble. Feels items get hung at sternal notch. Occasional regurgitation. History of GERD. Taking over the counter cimetidine once a day when she remembers. When taking cimetidine, it works well. Does note increase burping. Doesn't want to be on a PPI. Drinks coffee and eats chocolate.  Taking meloxicam and occasionally ibuprofen for arthritis and fibromyalgia.   No abdominal pain. Intermittent constipation. Will use bisacodyl a couple times a week which works well for her. No diarrhea. No blood in the stool. No black stool.  No unintentional weight loss.  Occasional dizziness when changing positions. No CP or  palpitations. SOB with exertion. Intermittent cough.    Past Medical History:  Diagnosis Date  . Anemia   . Anxiety   . Arthritis   . BIPOLAR AFFECTIVE DISORDER 05/11/2009   Qualifier: Diagnosis of  By: Orville Govern CMA, Arbie Cookey    . Bipolar affective disorder ([place])   . Bipolar disorder ([place])   . Cataract   . Chest pain 05/11/2009   Qualifier: Diagnosis of  By: Orville Govern CMA, Arbie Cookey    . Chronic back pain   . Chronic cough 11/23/2014  . Chronic fatigue   . Chronic pain syndrome 03/05/2013  . Complication of anesthesia   . Constipation   . Depression   . Difficulty in walking(719.7) 07/26/2011  . Dizziness   . Dyslipidemia   . Fecal incontinence 11/23/2014  . Fibromyalgia   . GERD (gastroesophageal reflux disease)   . H/O anxiety disorder 03/05/2013   Overview:  Heart Cath x 2 per patient; most recent 4 yrs ago with "negative" findings per patient; Cardiologist, Dr Dorris Carnes following; last seen 10/2011; most recent episode 11/2012 with admit [facility]) and negative work-up per patient   . H/O: hysterectomy   . History of back surgery 03/05/2013  . History of gastroesophageal reflux (GERD) 03/05/2013  . History of kidney stones   . Hx of adenomatous colonic polyps 2005  . Hyperlipidemia   . Hypertension   . Hypothyroidism   . Knee pain   . Midsternal chest pain 08/19/2015  . Muscle weakness (generalized) 04/27/2013  . Non-obstructive CAD    a. 08/2015 Cath: LM 40, LCX small, 30ost, 26m, OM2 small, RCA nl/small,  EF 55-65%.  . Osteopenia   . PONV (postoperative nausea and vomiting)   . Primary osteoarthritis of left knee 04/17/2020  . Scalp laceration 03/01/2020  . Sciatica     Past Surgical History:  Procedure Laterality Date  . ABDOMINAL HYSTERECTOMY  1994   TAH- DUB, BSO  . [place] bunionectomy     bilateral  . BREAST BIOPSY Left 1989  . BREAST LUMPECTOMY    . BREAST SURGERY N/A    Phreesia 09/14/2020  . BUNIONECTOMY Left 2005  . CARDIAC CATHETERIZATION  2010   +CAD   . CARDIAC CATHETERIZATION N/A 08/19/2015   Procedure: Left Heart Cath and Coronary Angiography;  Surgeon: Belva Crome, MD;  Location: [place] [facility];  Service: Cardiovascular;  Laterality: N/A;  . 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  Remainder of colonic mucosa was normal  . COLONOSCOPY WITH ESOPHAGOGASTRODUODENOSCOPY (EGD) N/A 02/01/2014   EVO:JJKK erosive reflux/gastric polyp/normal rectum/scattered left sided diverticula, normal distal TI. gastric bx negative, fundic gland polyp. next TCS 01/2019.  Marland Kitchen CYSTECTOMY     pilonidial cyst  . ESOPHAGOGASTRODUODENOSCOPY     followed by colonoscopy with snare polypectomy  . EYE SURGERY Bilateral    cataract surgery with lens implants  . JOINT REPLACEMENT N/A    Phreesia 09/14/2020  . REVISION TOTAL HIP ARTHROPLASTY Right   . right knee replacement  02/2013  . SHOULDER ARTHROSCOPY Left 2005  . SHOULDER ARTHROSCOPY Right   . SPINAL FUSION    . SPINAL FUSION  2004   L4-L5  . SPINE SURGERY N/A    [employer]reesia 09/14/2020  . TOTAL HIP ARTHROPLASTY Right 06/2006  . TOTAL HIP ARTHROPLASTY  8/12  . TOTAL KNEE ARTHROPLASTY Left 04/18/2020  . TOTAL KNEE ARTHROPLASTY Left 04/18/2020   Procedure: LEFT TOTAL KNEE ARTHROPLASTY;  Surgeon: Leandrew Koyanagi, MD;  Location: [place];  Service: Orthopedics;  Laterality: Left;  . TOTAL SHOULDER REPLACEMENT Left 08/2005    Current Outpatient Medications  Medication Sig Dispense Refill  . ALPRAZolam (XANAX) 0.5 MG tablet Take 0.25-0.5 mg by mouth daily as needed for anxiety.     Marland Kitchen aspirin EC 81 MG tablet Take 1 tablet (81 mg total) by mouth 2 (two) times daily. 84 tablet 0  . atorvastatin (LIPITOR) 40 MG tablet TAKE (1) TABLET BY MOUTH AT BEDTIME. 90 tablet 0  . bisacodyl (DULCOLAX) 5 MG EC tablet Take 1 tablet by mouth daily as needed for mild constipation (constipation).     . Calcium Carb-Cholecalciferol (CALCIUM/VITAMIN D PO)  Take 1 tablet by mouth daily.    . Cholecalciferol (VITAMIN D) 50 MCG (2000 UT) CAPS Take 2,000 Units by mouth daily.    . Cimetidine (TAGAMET PO) Take 1 tablet by mouth daily.    . DULoxetine (CYMBALTA) 60 MG capsule Take 120 mg by mouth daily.    Marland Kitchen estradiol (ESTRACE) 1 MG tablet TAKE 1/2 TABLET BY MOUTH DAILY. (Patient taking differently: Take 0.5 mg by mouth daily.) 30 tablet 11  . lamoTRIgine (LAMICTAL) 150 MG tablet Take 150 mg by mouth at bedtime.    Marland Kitchen levothyroxine (SYNTHROID) 25 MCG tablet Take 1 tablet (25 mcg total) by mouth daily. 30 tablet 2  . losartan (COZAAR) 50 MG tablet TAKE (1) TABLET BY MOUTH ONCE DAILY. 90 tablet 1  . meloxicam (MOBIC) 7.5 MG tablet Take 1 tablet (7.5 mg total) by mouth 2 (two) times daily as needed for pain. 180 tablet 6  .  Multiple Vitamin (MULTIVITAMIN) tablet Take 1 tablet by mouth daily.    . niacin (SLO-NIACIN) 500 MG tablet Take 500 mg by mouth daily.    . nitroGLYCERIN (NITROSTAT) 0.4 MG SL tablet PLACE 1 TAB UNDER TONGUE EVERY 5 MIN IF NEEDED FOR CHEST PAIN. MAY USE 3 TIMES.NO RELIEF CALL 911. (Patient taking differently: Place 0.4 mg under the tongue every 5 (five) minutes as needed for chest pain. PLACE 1 TAB UNDER TONGUE EVERY 5 MIN IF NEEDED FOR CHEST PAIN. MAY USE 3 TIMES.NO RELIEF CALL 911.) 25 tablet 3  . Omega-3 Fatty Acids (FISH OIL) 1000 MG CAPS Take 1,000 mg by mouth daily.    . Probiotic Product (PROBIOTIC DAILY PO) Take 1 tablet by mouth daily.     . risperiDONE (RISPERDAL) 1 MG tablet Take 0.5 mg by mouth at bedtime.    . WELLBUTRIN XL 300 MG 24 hr tablet Take 300 mg by mouth daily.      No current facility-administered medications for this visit.    Allergies as of 11/02/2020 - Review Complete 11/02/2020  Allergen Reaction Noted  . Hydrocodone-acetaminophen Hives and Itching 07/05/2008    Family History  Problem Relation Age of Onset  . Heart disease Mother   . Heart attack Mother   . Heart disease Father   . Heart attack  Father   . Stroke Sister   . Stroke Maternal Grandfather   . Cancer Paternal Grandmother        unknown primary  . Stomach cancer Maternal Aunt   . Colon cancer Neg Hx     Social History   Socioeconomic History  . Marital status: Widowed    Spouse name: Passed in Jan 2013  . Number of children: 2  . Years of education: Not on file  . Highest education level: Master's degree (e.g., MA, MS, MEng, MEd, MSW, MBA)  Occupational History  . 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 Smoking status: Never Smoker  . Smokeless tobacco: Never Used  Vaping Use  . Vaping Use: Never used  Substance and Sexual Activity  . Alcohol use: Yes    Alcohol/week: 7.0 standard drinks    Types: 7 Glasses of wine per week    Comment: almost daily  . Drug use: No  . Sexual activity: Not Currently    Partners: Male    Birth control/protection: Surgical    Comment: TAH  Other Topics Concern  . Not on file  Social History Narrative   Lives alone   2 children, 9 grandchildren, 5 great grand   One in [place] and another [place]      Enjoy: reading;       Diet: eats all food groups    Caffeine: 2 cups of coffee, some tea   Water: 6-8 cups day        Wears seat belt    Does not use phone while driving   Oceanographer at home   No weapons    Social Determinants of Health   Financial Resource Strain: Low Risk   . Difficulty of Paying Living Expenses: Not very hard  Food Insecurity: No Food Insecurity  . Worried About Charity fundraiser in the Last Year: Never true  . Ran Out of Food in the Last Year: Never true  Transportation Needs: No Transportation Needs  . Lack of Transportation (Medical): No  . Lack of Transportation (Non-Medical): No  Physical Activity: Sufficiently Active  . Days of Exercise per Week: 5 days  . Minutes of Exercise per Session: 60 min  Stress: No Stress Concern Present  . Feeling of Stress : Not at  all  Social Connections: Socially Isolated  . Frequency of Communication with Friends and Family: Three times a week  . Frequency of Social Gatherings with Friends and Family: Twice a week  . Attends Religious Services: Never  . Active Member of Clubs or Organizations: No  . Attends Archivist Meetings: Never  . Marital Status: Widowed    Review of Systems: Gen: Denies fever, chills, cold or flu like symptoms.   CV: See HPI Resp: See HPI GI: See HPI. Heme: See HPI  Physical Exam: BP 138/89   Pulse (!) 103   Temp (!) 96.6 F (35.9 C) (Temporal)   Ht 4\' 9"  (1.448 m)   Wt 119 lb (54 kg)   LMP 02/10/1993   BMI 25.75 kg/m  General:   Alert and oriented. No distress noted. Pleasant and cooperative.   Head:  Normocephalic and atraumatic.  Eyes:  Conjuctiva clear without scleral icterus. Heart:  S1, S2 present without murmurs appreciated. Lungs: Clear to auscultation bilaterally. No wheezes, rales, or rhonchi. No distress.  Abdomen: +BS, soft, non-tender and non-distended. No rebound or guarding. No HSM or masses noted. Msk:  Symmetrical without gross deformities. Normal posture. Extremities:  Without edema. Neurologic:  Alert and  oriented x4 Psych: Normal mood and affect.

## 2020-11-02 ENCOUNTER — Ambulatory Visit: Payer: Medicare PPO | Admitting: Gastroenterology

## 2020-11-02 ENCOUNTER — Other Ambulatory Visit: Payer: Self-pay

## 2020-11-02 ENCOUNTER — Encounter: Payer: Self-pay | Admitting: Gastroenterology

## 2020-11-02 VITALS — BP 138/89 | HR 103 | Temp 96.6°F | Ht <= 58 in | Wt 119.0 lb

## 2020-11-02 DIAGNOSIS — Z86010 Personal history of colonic polyps: Secondary | ICD-10-CM | POA: Diagnosis not present

## 2020-11-02 DIAGNOSIS — R1310 Dysphagia, unspecified: Secondary | ICD-10-CM | POA: Insufficient documentation

## 2020-11-02 DIAGNOSIS — K219 Gastro-esophageal reflux disease without esophagitis: Secondary | ICD-10-CM | POA: Diagnosis not present

## 2020-11-02 NOTE — Patient Instructions (Signed)
We will get you scheduled for an upper endoscopy with possible dilation of your esophagus and colonoscopy in the near future with Dr. Gala Romney.  For trouble swallowing: All meats should be chopped finely. Avoid tough textures. Eat slowly, take small bites, chew thoroughly, and drink plenty of liquids throughout your meals. If something were to get hung in your esophagus and not come up or go down, you should proceed to the emergency room.  Continue taking cimetidine daily. Try setting an alarm to help you remember to take this medicine.  Follow a GERD diet:   Avoid fried, fatty, greasy, spicy, citrus foods.  Avoid caffeine and carbonated beverages.  Avoid chocolate.  Try eating 4-6 small meals a day rather than 3 large meals.  Do not eat within 3 hours of laying down.  Prop head of bed up on wood or bricks to create a 6 inch incline.  We will plan to see you back after your procedures.  Do not hesitate to call if you have any questions or concerns prior.  It was nice to meet you today!  I hope you have a wonderful Christmas!  Aliene Altes, PA-C [facility]

## 2020-11-02 NOTE — Patient Instructions (Signed)
PA for TCS/EGD/-/+DIL submitted via [employer] website. [employer]# [id number], valid 12/22/20-01/21/21.

## 2020-11-02 NOTE — Progress Notes (Signed)
CC'ED TO PCP 

## 2020-11-02 NOTE — Assessment & Plan Note (Signed)
79 year old female currently due for surveillance colonoscopy with history of adenomatous colon polyps.  Last colonoscopy in 2015 with scattered left-sided diverticula, otherwise normal exam with recommendations to repeat in 5 years.  Occasional mild constipation that is well managed with bisacodyl twice weekly.  Otherwise, no significant lower GI symptoms.  No alarm symptoms.  No family history of colon cancer.   Plan: Proceed with colonoscopy with propofol with Dr. Gala Romney in the near future. The risks, benefits, and alternatives have been discussed with the patient in detail. The patient states understanding and desires to proceed.  ASA III Suspect this will be her last colonoscopy. Follow-up after procedure.

## 2020-11-02 NOTE — Assessment & Plan Note (Addendum)
Chronic history of GERD.  Previously on PPI, but she discontinued this medication due to concerns for long-term side effects.  Currently taking over-the-counter cimetidine.  When remembering to take cimetidine, her reflux symptoms seem to be fairly well controlled.  She does report new onset dysphagia over the last year as discussed above. Also taking meloxicam and ibuprofen as needed. We discussed the possibility of resuming low-dose PPI, but patient prefers to continue cimetidine for now.  Plan: Continue cimetidine daily.  Advise she set an alarm to help remember to take this medication every day. Counseled on GERD diet/lifestyle:  Avoid fried, fatty, greasy, spicy, citrus foods.  Avoid caffeine and carbonated beverages.  Avoid chocolate.  Try eating 4-6 small meals a day rather than 3 large meals.  Do not eat within 3 hours of laying down.  Prop head of bed up on wood or bricks to create a 6 inch incline. Proceed with EGD +/-dilation with propofol with Dr. Gala Romney in the near future. The risks, benefits, and alternatives have been discussed with the patient in detail. The patient states understanding and desires to proceed.  ASA III Discussed importance of limiting meloxicam and ibuprofen as much as possible.  Discussed if she has evidence of reflux esophagitis, may need to consider low-dose PPI. Follow-up after procedure.

## 2020-11-02 NOTE — Assessment & Plan Note (Addendum)
79 year old female reporting progressive dysphagia over the last year with sensation of solid foods and pills getting hung at the sternal notch and occasionally requiring regurgitation.  Also with chronic history of GERD that seems to be fairly well controlled on cimetidine when she remembers to take medication.  She took herself off PPIs and prefers not to resume PPI at this time.  No unintentional weight loss or odynophagia.  Differentials include esophageal web, ring, or stricture in the setting of chronic GERD.  Less likely malignancy.  Plan: Proceed with EGD +/-dilation with propofol with Dr. Gala Romney in the near future. The risks, benefits, and alternatives have been discussed with the patient in detail. The patient states understanding and desires to proceed.  ASA III All meats should be chopped finely. Avoid tough textures. Eat slowly, take small bites, chew thoroughly, and drink plenty of liquids throughout meals. Continue OTC cimetidine daily. Follow-up after procedure.

## 2020-11-03 ENCOUNTER — Other Ambulatory Visit: Payer: Self-pay | Admitting: Physician Assistant

## 2020-11-22 ENCOUNTER — Other Ambulatory Visit: Payer: Self-pay | Admitting: Family Medicine

## 2020-11-22 DIAGNOSIS — E039 Hypothyroidism, unspecified: Secondary | ICD-10-CM

## 2020-11-22 MED ORDER — LEVOTHYROXINE SODIUM 25 MCG PO TABS: 25.0000 ug | ORAL_TABLET | Freq: Every day | ORAL | 1 refills | Status: DC

## 2020-12-02 ENCOUNTER — Other Ambulatory Visit: Payer: Self-pay | Admitting: Internal Medicine

## 2020-12-06 ENCOUNTER — Telehealth: Payer: Self-pay | Admitting: Internal Medicine

## 2020-12-06 NOTE — Telephone Encounter (Signed)
Patient called and wants to cancel her procedure for now.

## 2020-12-06 NOTE — Telephone Encounter (Signed)
Noted. Called patient and LM advising to call back when she is ready to r/s. Called endo and made aware to cancel for now

## 2020-12-12 IMAGING — CT CT RENAL STONE PROTOCOL
2 of 4 series · 16 of 46 positions shown, 18 images · non-contrast
Comparison: CT 10/22/2007

CLINICAL DATA: Left flank pain and nausea

EXAM:
CT ABDOMEN AND PELVIS WITHOUT CONTRAST
TECHNIQUE: Multidetector CT imaging of the abdomen and pelvis was performed
following the standard protocol without IV contrast.

[Series 2: axial st · axial · 0.73mm/px · z∈[+985,+1320]mm · 13 of 77 slices shown, 15 images]
[im 5/77  soft-tissue]
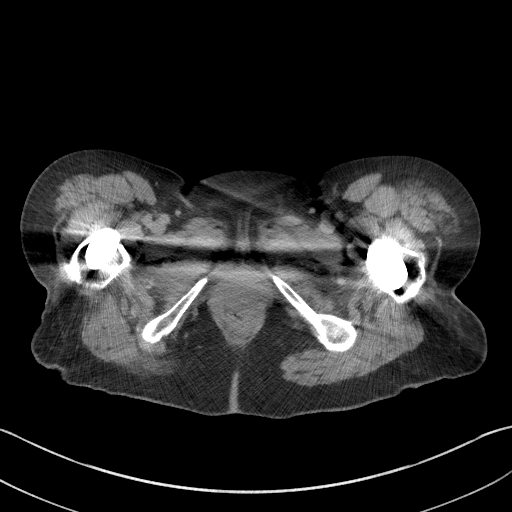
[im 5/77  bone]
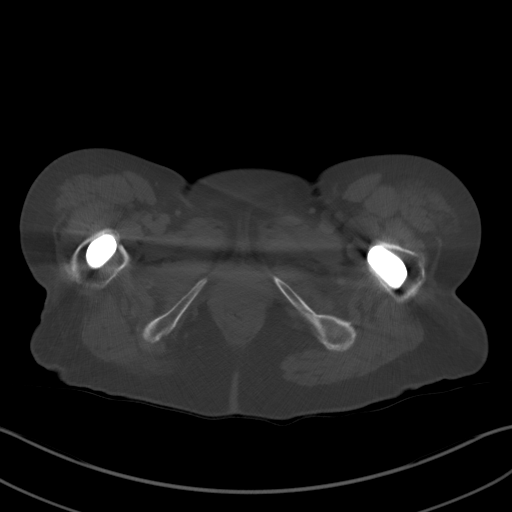
[im 10/77  soft-tissue]
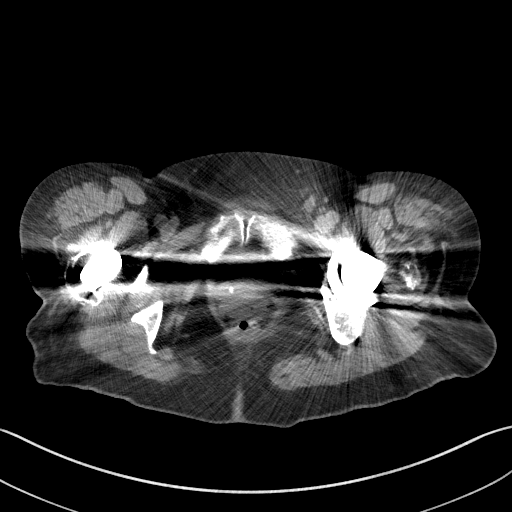
[im 15/77  soft-tissue]
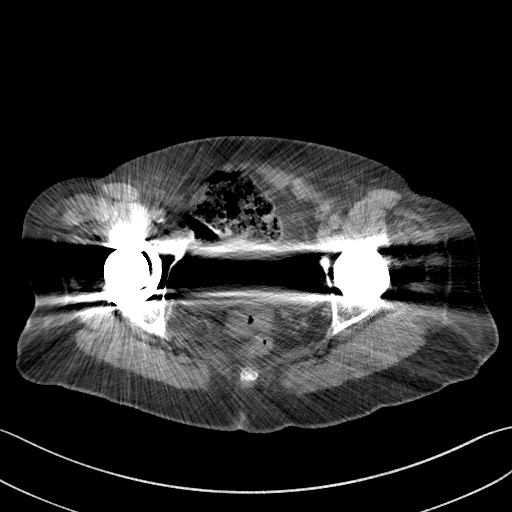
[im 24/77  soft-tissue]
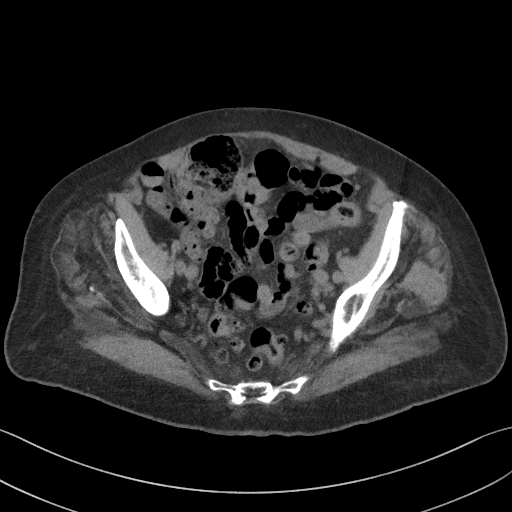
[im 29/77  soft-tissue]
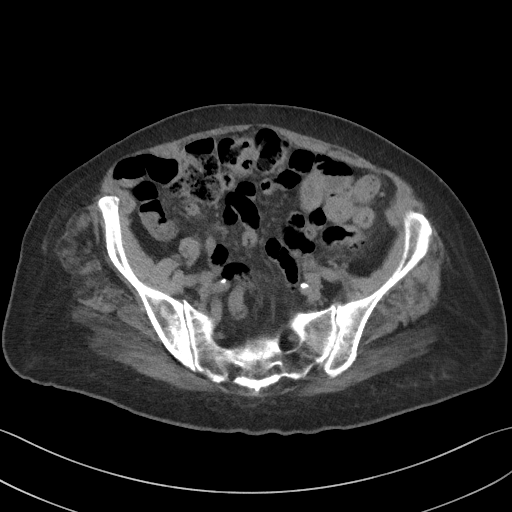
[im 34/77  soft-tissue]
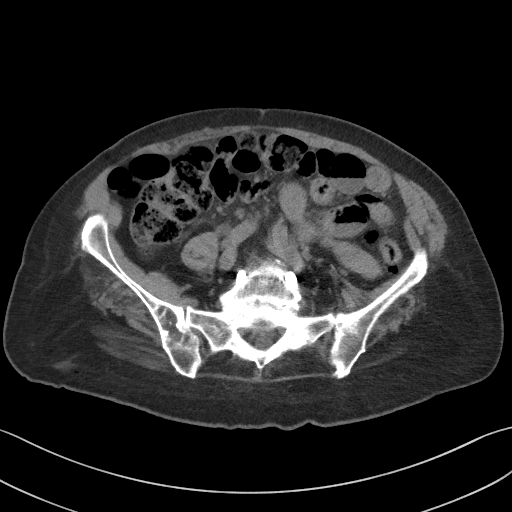
[im 39/77  soft-tissue]
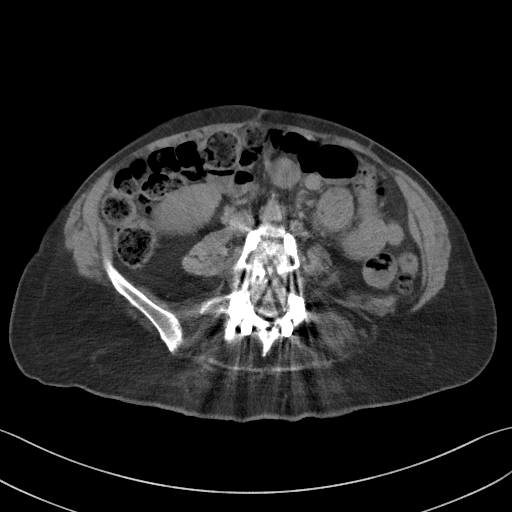
[im 43/77  soft-tissue]
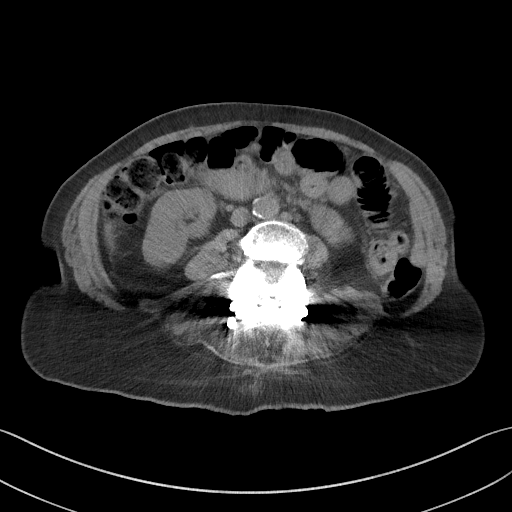
[im 48/77  soft-tissue]
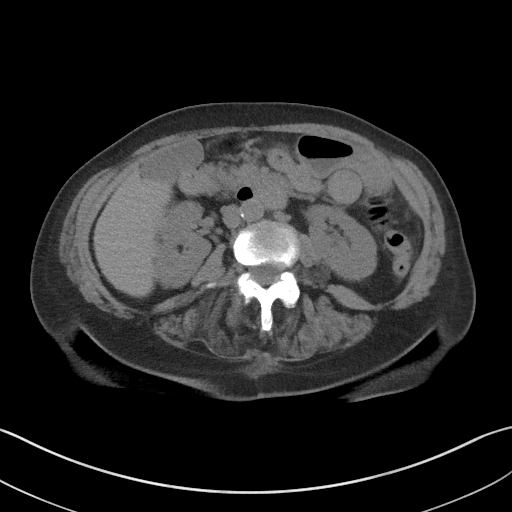
[im 48/77  bone]
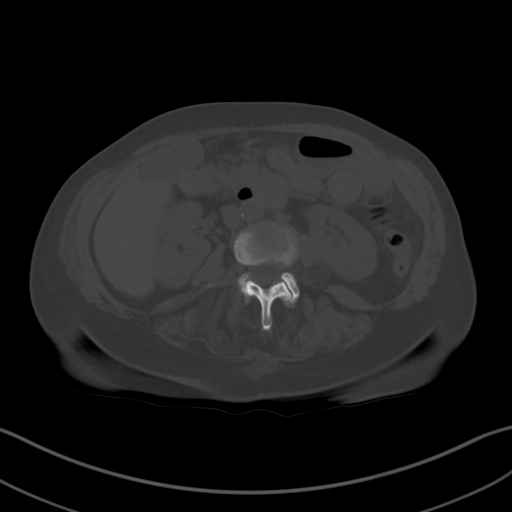
[im 53/77  soft-tissue]
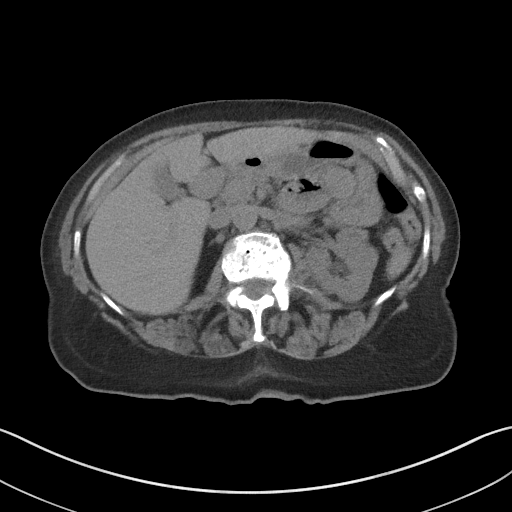
[im 62/77  soft-tissue]
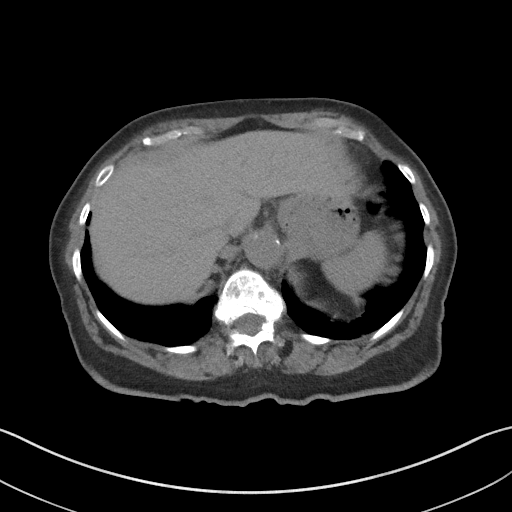
[im 67/77  soft-tissue]
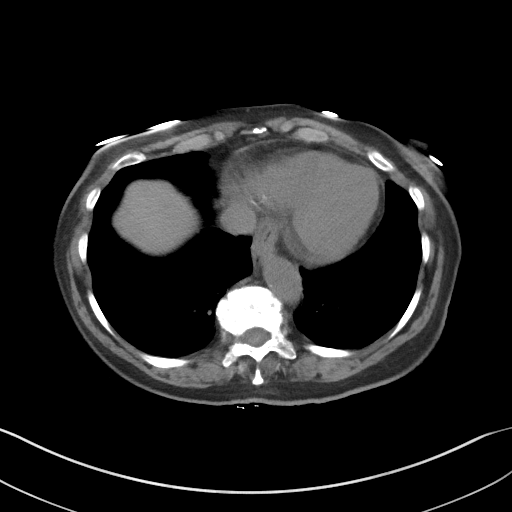
[im 72/77  soft-tissue]
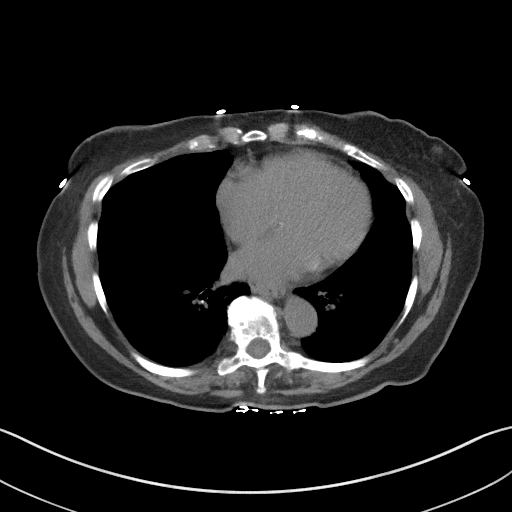

[Series 5: coronal st · coronal · 0.64mm/px · 3 of 93 slices shown]
[im 31/93  soft-tissue]
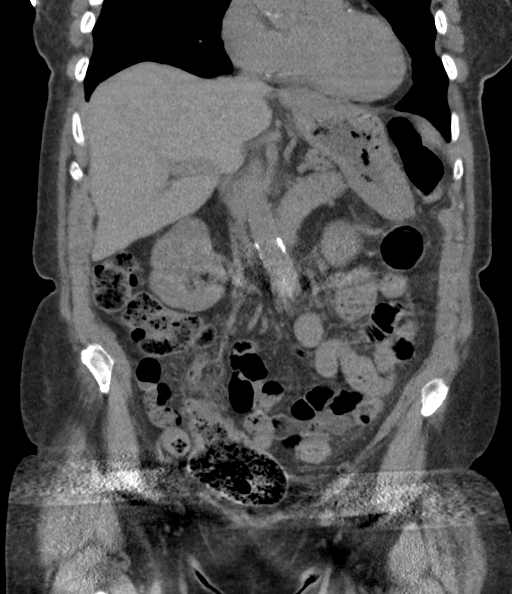
[im 41/93  soft-tissue]
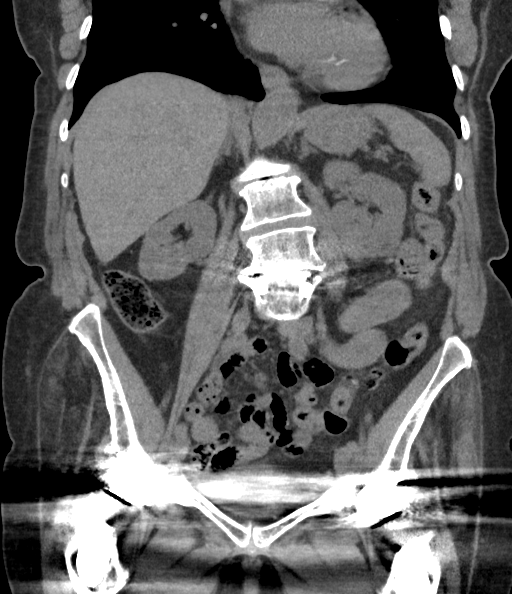
[im 52/93  soft-tissue]
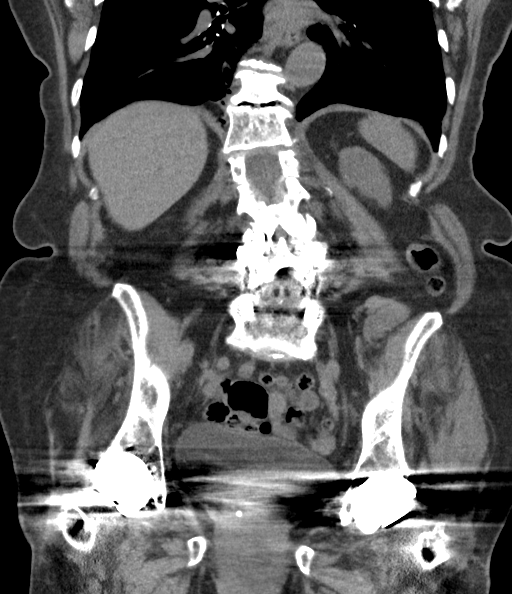

[16 of 46 positions shown; findings below may reference images not displayed]

FINDINGS: Lower chest: Bibasilar scarring and or atelectasis. Coronary artery
calcifications.

Hepatobiliary: Liver has an unremarkable noncontrast appearance.
Gallbladder is within normal limits. No hyperdense gallstone. No
biliary dilatation.

Pancreas: Not well evaluated on non contrasted study. Appears
atrophic. No evidence of ductal dilatation or inflammatory changes.

Spleen: Normal in size without focal abnormality.

Adrenals/Urinary Tract: Unremarkable adrenal glands. Kidneys have an
unremarkable noncontrast appearance. No evidence of focal lesion,
renal stone, or hydronephrosis. Visualized ureters are unremarkable.
No definite renal calculi. Urinary bladder is largely obscured by
metallic streak artifact from bilateral hip prostheses. Multiple
pelvic phleboliths are again seen.

Stomach/Bowel: Stomach is within normal limits. No dilated loops of
small bowel. Sigmoid diverticulosis. No evidence of focal bowel wall
thickening or inflammatory changes. Mild-to-moderate volume of stool
within the colon.

Vascular/Lymphatic: Scattered aortoiliac atherosclerotic
calcifications without aneurysm. No abdominopelvic lymphadenopathy.

Reproductive: Status post hysterectomy. No adnexal masses.

Other: No free fluid. No abdominopelvic fluid collection. No
pneumoperitoneum. No abdominal wall hernia.

Musculoskeletal: Bilateral hip arthroplasties. Prior L4-5 PLIF with
unchanged grade 1 anterolisthesis. Interval progression of
spondylosis within the lumbar and visualized lower thoracic spines.
No acute osseous abnormality.
IMPRESSION: 1. No acute abdominopelvic findings. Specifically, no evidence of
obstructive uropathy.
2. Sigmoid diverticulosis without evidence of acute diverticulitis.
3. Mild-to-moderate volume of stool within the colon.
4. Progressive thoracolumbar spondylosis.
5. Aortic atherosclerosis. ([id number]).

## 2020-12-20 ENCOUNTER — Other Ambulatory Visit (HOSPITAL_COMMUNITY): Payer: Medicare PPO

## 2020-12-22 ENCOUNTER — Encounter (HOSPITAL_COMMUNITY): Payer: Self-pay

## 2020-12-22 ENCOUNTER — Ambulatory Visit (HOSPITAL_COMMUNITY): Admit: 2020-12-22 | Payer: Medicare PPO | Admitting: Internal Medicine

## 2020-12-22 SURGERY — COLONOSCOPY WITH PROPOFOL
Anesthesia: Monitor Anesthesia Care

## 2020-12-29 ENCOUNTER — Telehealth: Payer: Self-pay | Admitting: Orthopaedic Surgery

## 2020-12-29 ENCOUNTER — Telehealth: Payer: Self-pay

## 2020-12-29 NOTE — Telephone Encounter (Signed)
Pt requesting medication for her muscle spasms in her back and wants to know if can give her the medication for her to have a dental procedure done.

## 2020-12-29 NOTE — Telephone Encounter (Signed)
Patient called needs med refill estradiol (ESTRACE) 1 mg  [employer]

## 2020-12-30 ENCOUNTER — Other Ambulatory Visit: Payer: Self-pay | Admitting: Physician Assistant

## 2020-12-30 MED ORDER — AMOXICILLIN 500 MG PO CAPS: ORAL_CAPSULE | ORAL | 0 refills | Status: DC

## 2020-12-30 MED ORDER — METHOCARBAMOL 500 MG PO TABS: 500.0000 mg | ORAL_TABLET | Freq: Two times a day (BID) | ORAL | 0 refills | Status: DC | PRN

## 2020-12-30 NOTE — Telephone Encounter (Signed)
I have never prescribed it- which means an appt should be made to go over the need for it and assessment on continuing it. Looks like she has one next month, might be able to move that up or do a phone visit?

## 2020-12-30 NOTE — Telephone Encounter (Signed)
Sent in

## 2020-12-30 NOTE — Telephone Encounter (Signed)
Can you please call and add her for phone visit Monday if available?

## 2020-12-30 NOTE — Telephone Encounter (Signed)
Pt says she was getting this through her gyn in [place] but she has retired. Wants to know if this is something you can start prescribing her? Please advise.

## 2021-01-02 ENCOUNTER — Other Ambulatory Visit: Payer: Self-pay

## 2021-01-02 ENCOUNTER — Encounter: Payer: Self-pay | Admitting: Internal Medicine

## 2021-01-02 ENCOUNTER — Telehealth (INDEPENDENT_AMBULATORY_CARE_PROVIDER_SITE_OTHER): Payer: Medicare PPO | Admitting: Internal Medicine

## 2021-01-02 DIAGNOSIS — M8580 Other specified disorders of bone density and structure, unspecified site: Secondary | ICD-10-CM

## 2021-01-02 DIAGNOSIS — N951 Menopausal and female climacteric states: Secondary | ICD-10-CM

## 2021-01-02 MED ORDER — ESTRADIOL 1 MG PO TABS
ORAL_TABLET | ORAL | 11 refills | Status: DC
Start: 2021-01-02 — End: 2022-02-26

## 2021-01-02 NOTE — Telephone Encounter (Signed)
LVM for pt to call the office.

## 2021-01-02 NOTE — Progress Notes (Signed)
Virtual Visit via Telephone Note   This visit type was conducted due to national recommendations for restrictions regarding the COVID-19 Pandemic (e.g. social distancing) in an effort to limit this patient's exposure and mitigate transmission in our community.  Due to her co-morbid illnesses, this patient is at least at moderate risk for complications without adequate follow up.  This format is felt to be most appropriate for this patient at this time.  The patient did not have access to video technology/had technical difficulties with video requiring transitioning to audio format only (telephone).  All issues noted in this document were discussed and addressed.  No physical exam could be performed with this format.  Evaluation Performed:  Follow-up visit  Date:  01/02/2021   ID:  [name] [name], DOB [birth year], MRN [id number]  Patient Location: Home Provider Location: Office/Clinic  Participants: Patient Location of Patient: Home Location of Provider: Telehealth Consent was obtain for visit to be over via telehealth. I verified that I am speaking with the correct person using two identifiers.  PCP:  Perlie Mayo, NP   Chief Complaint:  Postmenopausal symptoms  History of Present Illness:    [name] [name] is a 80 y.o. female who has a televisit for discussion of HRT for postmenopausal symptoms.  She has been taking Estrace 0.5 mg QD and has been doing well with it. She requests a refill for it. She has had TAH. She denies any h/o DVT or PE. She currently denies any hot flashes or perineal area dryness or itching while on Estrace.  The patient does not have symptoms concerning for COVID-19 infection (fever, chills, cough, or new shortness of breath).   Past Medical, Surgical, Social History, Allergies, and Medications have been Reviewed.  Past Medical History:  Diagnosis Date  . Anemia   . Anxiety   . Arthritis   . BIPOLAR AFFECTIVE DISORDER 05/11/2009   Qualifier:  Diagnosis of  By: Orville Govern CMA, Arbie Cookey    . Bipolar affective disorder ([place])   . Bipolar disorder ([place])   . Cataract   . Chest pain 05/11/2009   Qualifier: Diagnosis of  By: Orville Govern CMA, Arbie Cookey    . Chronic back pain   . Chronic cough 11/23/2014  . Chronic fatigue   . Chronic pain syndrome 03/05/2013  . Complication of anesthesia   . Constipation   . Depression   . Difficulty in walking(719.7) 07/26/2011  . Dizziness   . Dyslipidemia   . Fecal incontinence 11/23/2014  . Fibromyalgia   . GERD (gastroesophageal reflux disease)   . H/O anxiety disorder 03/05/2013   Overview:  Heart Cath x 2 per patient; most recent 4 yrs ago with "negative" findings per patient; Cardiologist, Dr Dorris Carnes following; last seen 10/2011; most recent episode 11/2012 with admit Forestine Na [facility]) and negative work-up per patient   . H/O: hysterectomy   . History of back surgery 03/05/2013  . History of gastroesophageal reflux (GERD) 03/05/2013  . History of kidney stones   . Hx of adenomatous colonic polyps 2005  . Hyperlipidemia   . Hypertension   . Hypothyroidism   . Knee pain   . Midsternal chest pain 08/19/2015  . Muscle weakness (generalized) 04/27/2013  . Non-obstructive CAD    a. 08/2015 Cath: LM 40, LCX small, 30ost, 55m, OM2 small, RCA nl/small, EF 55-65%.  . Osteopenia   . PONV (postoperative nausea and vomiting)   . Primary osteoarthritis of left knee 04/17/2020  . Scalp laceration 03/01/2020  .  Sciatica    Past Surgical History:  Procedure Laterality Date  . ABDOMINAL HYSTERECTOMY  1994   TAH- DUB, BSO  . [place] bunionectomy     bilateral  . BREAST BIOPSY Left 1989  . BREAST LUMPECTOMY    . BREAST SURGERY N/A    Phreesia 09/14/2020  . BUNIONECTOMY Left 2005  . CARDIAC CATHETERIZATION  2010   +CAD  . CARDIAC CATHETERIZATION N/A 08/19/2015   Procedure: Left Heart Cath and Coronary Angiography;  Surgeon: Belva Crome, MD;  Location: [place] [facility];  Service: Cardiovascular;  Laterality:  N/A;  . 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  Remainder of colonic mucosa was normal  . COLONOSCOPY WITH ESOPHAGOGASTRODUODENOSCOPY (EGD) N/A 02/01/2014   WUJ:WJXB erosive reflux/gastric polyp/normal rectum/scattered left sided diverticula, normal distal TI. gastric bx negative, fundic gland polyp. next TCS 01/2019.  Marland Kitchen CYSTECTOMY     pilonidial cyst  . ESOPHAGOGASTRODUODENOSCOPY     followed by colonoscopy with snare polypectomy  . EYE SURGERY Bilateral    cataract surgery with lens implants  . JOINT REPLACEMENT N/A    Phreesia 09/14/2020  . REVISION TOTAL HIP ARTHROPLASTY Right   . right knee replacement  02/2013  . SHOULDER ARTHROSCOPY Left 2005  . SHOULDER ARTHROSCOPY Right   . SPINAL FUSION    . SPINAL FUSION  2004   L4-L5  . SPINE SURGERY N/A    Phreesia 09/14/2020  . TOTAL HIP ARTHROPLASTY Right 06/2006  . TOTAL HIP ARTHROPLASTY  8/12  . TOTAL KNEE ARTHROPLASTY Left 04/18/2020  . TOTAL KNEE ARTHROPLASTY Left 04/18/2020   Procedure: LEFT TOTAL KNEE ARTHROPLASTY;  Surgeon: Leandrew Koyanagi, MD;  Location: [facility];  Service: Orthopedics;  Laterality: Left;  . TOTAL SHOULDER REPLACEMENT Left 08/2005     Current Meds  Medication Sig  . ALPRAZolam (XANAX) 0.5 MG tablet Take 0.25-0.5 mg by mouth daily as needed for anxiety.   Marland Kitchen amoxicillin (AMOXIL) 500 MG capsule Take 4 pills one hour prior to dental work  . aspirin EC 81 MG tablet Take 1 tablet (81 mg total) by mouth 2 (two) times daily.  Marland Kitchen atorvastatin (LIPITOR) 40 MG tablet TAKE (1) TABLET BY MOUTH AT BEDTIME.  . bisacodyl (DULCOLAX) 5 MG EC tablet Take 1 tablet by mouth daily as needed for mild constipation (constipation).   . Calcium Carb-Cholecalciferol (CALCIUM/VITAMIN D PO) Take 1 tablet by mouth daily.  . Cholecalciferol (VITAMIN D) 50 MCG (2000 UT) CAPS Take 2,000 Units by mouth daily.  . Cimetidine (TAGAMET PO) Take 1 tablet by mouth daily.   . DULoxetine (CYMBALTA) 60 MG capsule Take 120 mg by mouth daily.  Marland Kitchen lamoTRIgine (LAMICTAL) 150 MG tablet Take 150 mg by mouth at bedtime.  Marland Kitchen levothyroxine (SYNTHROID) 25 MCG tablet Take 1 tablet (25 mcg total) by mouth daily.  Marland Kitchen losartan (COZAAR) 50 MG tablet Take 1 tablet (50 mg total) by mouth daily. Please make yearly appt with Dr. Harrington Challenger for March 2022 for future refills. Thank you 1st attempt  . meloxicam (MOBIC) 7.5 MG tablet Take 1 tablet (7.5 mg total) by mouth 2 (two) times daily as needed for pain.  . methocarbamol (ROBAXIN) 500 MG tablet Take 1 tablet (500 mg total) by mouth 2 (two) times daily as needed.  . Multiple Vitamin (MULTIVITAMIN) tablet Take 1 tablet by mouth daily.  . niacin (SLO-NIACIN) 500 MG tablet Take 500 mg by mouth daily.  . nitroGLYCERIN (NITROSTAT) 0.4 MG  SL tablet PLACE 1 TAB UNDER TONGUE EVERY 5 MIN IF NEEDED FOR CHEST PAIN. MAY USE 3 TIMES.NO RELIEF CALL 911. (Patient taking differently: Place 0.4 mg under the tongue every 5 (five) minutes as needed for chest pain. PLACE 1 TAB UNDER TONGUE EVERY 5 MIN IF NEEDED FOR CHEST PAIN. MAY USE 3 TIMES.NO RELIEF CALL 911.)  . Omega-3 Fatty Acids (FISH OIL) 1000 MG CAPS Take 1,000 mg by mouth daily.  . Probiotic Product (PROBIOTIC DAILY PO) Take 1 tablet by mouth daily.   . risperiDONE (RISPERDAL) 1 MG tablet Take 0.5 mg by mouth at bedtime.  . WELLBUTRIN XL 300 MG 24 hr tablet Take 300 mg by mouth daily.   . [DISCONTINUED] estradiol (ESTRACE) 1 MG tablet TAKE 1/2 TABLET BY MOUTH DAILY. (Patient taking differently: Take 0.5 mg by mouth daily.)     Allergies:   Hydrocodone-acetaminophen   ROS:   Please see the history of present illness.     All other systems reviewed and are negative.   Labs/Other Tests and Data Reviewed:    Recent Labs: 06/10/2020: TSH 3.940 07/12/2020: ALT 31; BUN 9; Creatinine, Ser 0.81; Hemoglobin 13.5; Platelets 263; Potassium 4.6; Sodium 134   Recent Lipid Panel Lab Results  Component Value  Date/Time   CHOL 171 04/17/2019 12:00 AM   TRIG 127 04/17/2019 12:00 AM   HDL 67 04/17/2019 12:00 AM   CHOLHDL 1.9 05/08/2016 09:52 AM   LDLCALC 79 04/17/2019 12:00 AM    Wt Readings from Last 3 Encounters:  11/02/20 119 lb (54 kg)  10/18/20 115 lb (52.2 kg)  08/31/20 115 lb (52.2 kg)     ASSESSMENT & PLAN:    Postmenopausal symptoms Refilled Estrace 0.5 mg QD Check Estradiol level in the next visit No h/o DVT or PE  Osteopenia On Calcium and Vitamin D supplements  Time:   Today, I have spent 9 minutes reviewing the chart, including problem list, medications, and with the patient with telehealth technology discussing the above problems.   Medication Adjustments/Labs and Tests Ordered: Current medicines are reviewed at length with the patient today.  Concerns regarding medicines are outlined above.   Tests Ordered: No orders of the defined types were placed in this encounter.   Medication Changes: Meds ordered this encounter  Medications  . estradiol (ESTRACE) 1 MG tablet    Sig: TAKE 1/2 TABLET BY MOUTH DAILY.    Dispense:  30 tablet    Refill:  11     Note: This dictation was prepared with Dragon dictation along with smaller phrase technology. Similar sounding words can be transcribed inadequately or may not be corrected upon review. Any transcriptional errors that result from this process are unintentional.      Disposition:  Follow up  Signed, Lindell Spar, MD  01/02/2021 10:00 AM     [employer]

## 2021-01-09 NOTE — Telephone Encounter (Signed)
Pt has physical scheduled 01-25-21 can address at that  visit

## 2021-01-25 ENCOUNTER — Other Ambulatory Visit: Payer: Self-pay | Admitting: Physician Assistant

## 2021-01-25 ENCOUNTER — Encounter: Payer: Medicare PPO | Admitting: Family Medicine

## 2021-01-25 ENCOUNTER — Encounter: Payer: Medicare PPO | Admitting: Nurse Practitioner

## 2021-01-30 DIAGNOSIS — F3131 Bipolar disorder, current episode depressed, mild: Secondary | ICD-10-CM | POA: Diagnosis not present

## 2021-02-27 ENCOUNTER — Telehealth: Payer: Self-pay | Admitting: *Deleted

## 2021-02-27 ENCOUNTER — Other Ambulatory Visit: Payer: Self-pay | Admitting: Physician Assistant

## 2021-02-27 NOTE — Telephone Encounter (Signed)
Ortho bundle call; Patient requesting sooner appointment with Dr. Erlinda Hong to discuss several issues related to low back, hip and knee.Appt scheduled for this Friday.

## 2021-03-03 ENCOUNTER — Ambulatory Visit: Payer: Medicare PPO | Admitting: Orthopaedic Surgery

## 2021-03-06 ENCOUNTER — Telehealth: Payer: Self-pay | Admitting: Internal Medicine

## 2021-03-06 ENCOUNTER — Other Ambulatory Visit: Payer: Self-pay

## 2021-03-06 MED ORDER — ATORVASTATIN CALCIUM 40 MG PO TABS: ORAL_TABLET | ORAL | 0 refills | Status: DC

## 2021-03-06 NOTE — Telephone Encounter (Signed)
 *  STAT* If patient is at the pharmacy, call can be transferred to refill team.   1. Which medications need to be refilled? (please list name of each medication and dose if known)   atorvastatin (LIPITOR) 40 MG tablet  2. Which pharmacy/location (including street and city if local pharmacy) is medication to be sent to?  [place]  3. Do they need a 30 day or 90 day supply? 90 day  Patient has appt with Dr Harrington Challenger on 04/03/21

## 2021-03-08 ENCOUNTER — Telehealth: Payer: Self-pay

## 2021-03-08 NOTE — Telephone Encounter (Signed)
Ryen patients daughter called regarding her appointment tomorrow she stated the patient told her that her previous doctor denied her for rx she isn't sure what the rx is, she also stated she consumes alcohol. Amarionna wants to be sure the right questions are being asked call [phone]

## 2021-03-09 ENCOUNTER — Ambulatory Visit (INDEPENDENT_AMBULATORY_CARE_PROVIDER_SITE_OTHER): Payer: Medicare PPO

## 2021-03-09 ENCOUNTER — Encounter: Payer: Self-pay | Admitting: Orthopaedic Surgery

## 2021-03-09 ENCOUNTER — Other Ambulatory Visit: Payer: Self-pay

## 2021-03-09 ENCOUNTER — Ambulatory Visit: Payer: Medicare PPO | Admitting: Orthopaedic Surgery

## 2021-03-09 DIAGNOSIS — Z96652 Presence of left artificial knee joint: Secondary | ICD-10-CM

## 2021-03-09 DIAGNOSIS — M545 Low back pain, unspecified: Secondary | ICD-10-CM

## 2021-03-09 MED ORDER — MELOXICAM 7.5 MG PO TABS: 7.5000 mg | ORAL_TABLET | Freq: Every day | ORAL | 2 refills | Status: DC | PRN

## 2021-03-09 MED ORDER — PREDNISONE 5 MG (21) PO TBPK: ORAL_TABLET | ORAL | 0 refills | Status: DC

## 2021-03-09 NOTE — Progress Notes (Signed)
Office Visit Note   Patient: [name] [name]           Date of Birth: 12-14-1940           MRN: [id number] Visit Date: 03/09/2021              Requested by: Perlie Mayo, NP [address] [place],  [place] [address] PCP: Perlie Mayo, NP   Assessment & Plan: Visit Diagnoses:  1. Acute left-sided low back pain, unspecified whether sciatica present   2. Status post total left knee replacement     Plan: Impression is chronic left-sided lower back pain and status post left total knee replacement.  In regards to her back, we will discuss starting her on a steroid taper followed by NSAIDs as needed in addition to a course of physical therapy.  Internal referral has been made.  In regards to her knee, she will continue to advance with activity as tolerated.  Continue to work on strengthening exercises.  Follow-up with [place] in 1 years time for repeat evaluation.  Dental prophylaxis reinforced.  Call with concerns or questions.  Follow-Up Instructions: Return in about 1 year (around 03/09/2022).   Orders:  Orders Placed This Encounter  Procedures  . XR KNEE 3 VIEW LEFT  . XR Lumbar Spine 2-3 Views  . Ambulatory referral to Physical Therapy   Meds ordered this encounter  Medications  . predniSONE (STERAPRED UNI-PAK 21 TAB) 5 MG (21) TBPK tablet    Sig: Take as directed    Dispense:  21 tablet    Refill:  0  . meloxicam (MOBIC) 7.5 MG tablet    Sig: Take 1 tablet (7.5 mg total) by mouth daily as needed for up to 14 doses for pain. Do not start taking until finished with steroid taper    Dispense:  30 tablet    Refill:  2      Procedures: No procedures performed   Clinical Data: No additional findings.   Subjective: Chief Complaint  Patient presents with  . Lower Back - Pain  . Left Knee - Pain    HPI patient is a very pleasant 80 year old female who comes in today with complaints of left knee and left lower back pain.  She is approximately 10 months status post left  total knee replacement.  The pain she has is more of a discomfort at times and only with ambulation.  This has improved over time.  In regards to her back, she complains of constant left lower back pain without any radiation down her leg.  She denies any groin or thigh pain.  She notes that this seems to lock up at times with certain activities.  She does note right leg paresthesias sometimes at night.  No bowel or bladder change or saddle paresthesias.  She is status post lumbar fusion by Dr. Redmond Pulling at [facility] several years ago.  Review of Systems as detailed in HPI.  All others reviewed and are negative.   Objective: Vital Signs: LMP 02/10/1993   Physical Exam well-developed well-nourished female no acute distress.  Alert oriented x3.  Ortho Exam left knee exam shows range of motion from 0 to 115 degrees.  No joint line tenderness.  She is stable valgus varus stress.  She is neurovascular intact distally.  Negative logroll negative FADIR.  No spinous tenderness.  She does have left-sided paraspinous tenderness.  Negative straight leg raise.  No pain with lumbar flexion, extension or rotation.  No  focal weakness.  She is neurovascular intact distally.  Specialty Comments:  No specialty comments available.  Imaging: XR KNEE 3 VIEW LEFT  Result Date: 03/09/2021 Prosthesis without complication  XR Lumbar Spine 2-3 Views  Result Date: 03/09/2021 Multilevel degenerative changes worse L5-S1 and L3-4 with previous fusion L4-5.    PMFS History: Patient Active Problem List   Diagnosis Date Noted  . Dysphagia 11/02/2020  . Encounter for examination following treatment at hospital 07/21/2020  . Need for immunization against influenza 07/21/2020  . Degenerative scoliosis in adult patient 07/21/2020  . Abdominal pain, epigastric 08/14/2019  . Bipolar disorder ([place])   . Hypothyroidism 08/04/2015    Class: Diagnosis of  . Constipation 09/22/2013  . H/O adenomatous polyp of colon 09/22/2013   . Vitamin D deficiency 03/05/2013  . Insomnia 03/05/2013  . H/O chest pain 03/05/2013  . H/O bilateral hip replacements 03/05/2013  . HTN (hypertension) 10/15/2011  . Hyperlipidemia 05/11/2009  . Coronary artery disease 05/11/2009  . GERD 05/11/2009   Past Medical History:  Diagnosis Date  . Anemia   . Anxiety   . Arthritis   . BIPOLAR AFFECTIVE DISORDER 05/11/2009   Qualifier: Diagnosis of  By: Orville Govern CMA, Arbie Cookey    . Bipolar affective disorder ([place])   . Bipolar disorder ([place])   . Cataract   . Chest pain 05/11/2009   Qualifier: Diagnosis of  By: Orville Govern CMA, Arbie Cookey    . Chronic back pain   . Chronic cough 11/23/2014  . Chronic fatigue   . Chronic pain syndrome 03/05/2013  . Complication of anesthesia   . Constipation   . Depression   . Difficulty in walking(719.7) 07/26/2011  . Dizziness   . Dyslipidemia   . Fecal incontinence 11/23/2014  . Fibromyalgia   . GERD (gastroesophageal reflux disease)   . H/O anxiety disorder 03/05/2013   Overview:  Heart Cath x 2 per patient; most recent 4 yrs ago with "negative" findings per patient; Cardiologist, Dr Dorris Carnes following; last seen 10/2011; most recent episode 11/2012 with admit Forestine Na [facility]) and negative work-up per patient   . H/O: hysterectomy   . History of back surgery 03/05/2013  . History of gastroesophageal reflux (GERD) 03/05/2013  . History of kidney stones   . Hx of adenomatous colonic polyps 2005  . Hyperlipidemia   . Hypertension   . Hypothyroidism   . Knee pain   . Midsternal chest pain 08/19/2015  . Muscle weakness (generalized) 04/27/2013  . Non-obstructive CAD    a. 08/2015 Cath: LM 40, LCX small, 30ost, 14m, OM2 small, RCA nl/small, EF 55-65%.  . Osteopenia   . PONV (postoperative nausea and vomiting)   . Primary osteoarthritis of left knee 04/17/2020  . Scalp laceration 03/01/2020  . Sciatica     Family History  Problem Relation Age of Onset  . Heart disease Mother   . Heart attack Mother   . Heart  disease Father   . Heart attack Father   . Stroke Sister   . Stroke Maternal Grandfather   . Cancer Paternal Grandmother        unknown primary  . Stomach cancer Maternal Aunt   . Colon cancer Neg Hx     Past Surgical History:  Procedure Laterality Date  . ABDOMINAL HYSTERECTOMY  1994   TAH- DUB, BSO  . [place] bunionectomy     bilateral  . BREAST BIOPSY Left 1989  . BREAST LUMPECTOMY    . BREAST SURGERY N/A    Phreesia  09/14/2020  . BUNIONECTOMY Left 2005  . CARDIAC CATHETERIZATION  2010   +CAD  . CARDIAC CATHETERIZATION N/A 08/19/2015   Procedure: Left Heart Cath and Coronary Angiography;  Surgeon: Belva Crome, MD;  Location: [facility];  Service: Cardiovascular;  Laterality: N/A;  . COLONOSCOPY  01/29/2007   SWF:UXNATF rectum, left-sided diverticula, 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  Remainder of colonic mucosa was normal  . COLONOSCOPY WITH ESOPHAGOGASTRODUODENOSCOPY (EGD) N/A 02/01/2014   TDD:UKGU erosive reflux/gastric polyp/normal rectum/scattered left sided diverticula, normal distal TI. gastric bx negative, fundic gland polyp. next TCS 01/2019.  Marland Kitchen CYSTECTOMY     pilonidial cyst  . ESOPHAGOGASTRODUODENOSCOPY     followed by colonoscopy with snare polypectomy  . EYE SURGERY Bilateral    cataract surgery with lens implants  . JOINT REPLACEMENT N/A    Phreesia 09/14/2020  . REVISION TOTAL HIP ARTHROPLASTY Right   . right knee replacement  02/2013  . SHOULDER ARTHROSCOPY Left 2005  . SHOULDER ARTHROSCOPY Right   . SPINAL FUSION    . SPINAL FUSION  2004   L4-L5  . SPINE SURGERY N/A    Phreesia 09/14/2020  . TOTAL HIP ARTHROPLASTY Right 06/2006  . TOTAL HIP ARTHROPLASTY  8/12  . TOTAL KNEE ARTHROPLASTY Left 04/18/2020  . TOTAL KNEE ARTHROPLASTY Left 04/18/2020   Procedure: LEFT TOTAL KNEE ARTHROPLASTY;  Surgeon: Leandrew Koyanagi, MD;  Location: [facility];  Service: Orthopedics;  Laterality: Left;  . TOTAL SHOULDER REPLACEMENT Left 08/2005    Social History   Occupational History  . 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 Smoking status: Never Smoker  . Smokeless tobacco: Never Used  Vaping Use  . Vaping Use: Never used  Substance and Sexual Activity  . Alcohol use: Yes    Alcohol/week: 7.0 standard drinks    Types: 7 Glasses of wine per week    Comment: almost daily  . Drug use: No  . Sexual activity: Not Currently    Partners: Male    Birth control/protection: Surgical    Comment: TAH

## 2021-03-09 NOTE — Telephone Encounter (Signed)
Patient has appt today.

## 2021-03-21 ENCOUNTER — Telehealth: Payer: Self-pay | Admitting: Physician Assistant

## 2021-03-21 NOTE — Telephone Encounter (Signed)
Patient called advised the prednisone helped some but, wanted to know if she needed another dose? The number to contact patient is [phone]

## 2021-03-21 NOTE — Telephone Encounter (Signed)
I would suggest just nsaids for now

## 2021-03-22 ENCOUNTER — Other Ambulatory Visit: Payer: Self-pay | Admitting: Physician Assistant

## 2021-03-22 MED ORDER — TRAMADOL HCL 50 MG PO TABS: 50.0000 mg | ORAL_TABLET | Freq: Three times a day (TID) | ORAL | 0 refills | Status: DC | PRN

## 2021-03-22 NOTE — Telephone Encounter (Signed)
IC and advised but she states she is still suffering in pain. She is already taking meloxicam without much relief. Wanted to know if there was anything else?

## 2021-03-22 NOTE — Telephone Encounter (Signed)
Sent in tramadol

## 2021-03-28 ENCOUNTER — Ambulatory Visit (HOSPITAL_COMMUNITY): Payer: Medicare PPO | Attending: Physician Assistant | Admitting: Physical Therapy

## 2021-03-28 ENCOUNTER — Encounter (HOSPITAL_COMMUNITY): Payer: Self-pay | Admitting: Physical Therapy

## 2021-03-28 ENCOUNTER — Other Ambulatory Visit: Payer: Self-pay

## 2021-03-28 DIAGNOSIS — M545 Low back pain, unspecified: Secondary | ICD-10-CM | POA: Diagnosis not present

## 2021-03-28 DIAGNOSIS — R2689 Other abnormalities of gait and mobility: Secondary | ICD-10-CM | POA: Insufficient documentation

## 2021-03-28 NOTE — Patient Instructions (Signed)
Access Code: [email or web address] URL: [email or web address] Date: 03/28/2021 Prepared by: Josue Hector  Exercises Heel Toe Raises at [facility] - 1 x daily - 3 x weekly - 2-3 sets - 10 reps Standing Hip Abduction Adduction at [place] - 1 x daily - 3 x weekly - 2-3 sets - 10 reps

## 2021-03-29 NOTE — Progress Notes (Signed)
   03/28/21 0001  Assessment  Medical Diagnosis LBP  Referring Provider (PT) Dwana Melena Pa-C  Onset Date/Surgical Date  (07/2020)  Prior Therapy Yes  Precautions  Precautions None  Restrictions  Weight Bearing Restrictions No  Balance Screen  Has the patient fallen in the past 6 months No  [place] residence  Living Arrangements Alone  Available Help at Discharge Family  Prior Function  Level of Independence Independent  Cognition  Overall Cognitive Status Within Functional Limits for tasks assessed  Posture/Postural Control  Posture/Postural Control Postural limitations  Postural Limitations Flexed trunk;Rounded Shoulders;Forward head;Increased thoracic kyphosis  ROM / Strength  AROM / PROM / Strength Strength  Strength  Strength Assessment Site Hip;Knee;Ankle  Right/Left Knee Right;Left  Right Hip Flexion 4/5  Right Hip ABduction 4-/5  Right Hip ADduction 4+/5  Left Hip Flexion 4/5  Left Hip ABduction 4/5  Left Hip ADduction 4+/5  Right Knee Extension 4/5  Left Knee Extension 4/5  Ambulation/Gait  Ambulation/Gait Yes  Ambulation/Gait Assistance 6: Modified independent (Device/Increase time)  Assistive device Straight cane  Gait Pattern Step-to pattern;Decreased stride length;Trendelenburg;Trunk flexed  Ambulation Surface Level;Indoor

## 2021-03-29 NOTE — Therapy (Signed)
[place], [place], [address] Phone: [phone]   Fax:  [phone]  Physical Therapy Evaluation  Patient Details  Name: [name] [name] MRN: [id number] Date of Birth: August 25, 1941 Referring Provider (PT): Dwana Melena Pa-C   Encounter Date: 03/28/2021   PT End of Session - 03/29/21 0852    Visit Number 1    Number of Visits 1    Authorization Type [employer]    Authorization - Visit Number 1    Authorization - Number of Visits 1    PT Start Time 1120    PT Stop Time 1200    PT Time Calculation (min) 40 min    Activity Tolerance Patient tolerated treatment well    Behavior During Therapy [facility] for tasks assessed/performed           Past Medical History:  Diagnosis Date  . Anemia   . Anxiety   . Arthritis   . BIPOLAR AFFECTIVE DISORDER 05/11/2009   Qualifier: Diagnosis of  By: Orville Govern CMA, Arbie Cookey    . Bipolar affective disorder ([place])   . Bipolar disorder ([place])   . Cataract   . Chest pain 05/11/2009   Qualifier: Diagnosis of  By: Orville Govern CMA, Arbie Cookey    . Chronic back pain   . Chronic cough 11/23/2014  . Chronic fatigue   . Chronic pain syndrome 03/05/2013  . Complication of anesthesia   . Constipation   . Depression   . Difficulty in walking(719.7) 07/26/2011  . Dizziness   . Dyslipidemia   . Fecal incontinence 11/23/2014  . Fibromyalgia   . GERD (gastroesophageal reflux disease)   . H/O anxiety disorder 03/05/2013   Overview:  Heart Cath x 2 per patient; most recent 4 yrs ago with "negative" findings per patient; Cardiologist, Dr Dorris Carnes following; last seen 10/2011; most recent episode 11/2012 with admit Forestine Na [facility]) and negative work-up per patient   . H/O: hysterectomy   . History of back surgery 03/05/2013  . History of gastroesophageal reflux (GERD) 03/05/2013  . History of kidney stones   . Hx of adenomatous colonic polyps 2005  . Hyperlipidemia   . Hypertension   . Hypothyroidism    . Knee pain   . Midsternal chest pain 08/19/2015  . Muscle weakness (generalized) 04/27/2013  . Non-obstructive CAD    a. 08/2015 Cath: LM 40, LCX small, 30ost, 57m, OM2 small, RCA nl/small, EF 55-65%.  . Osteopenia   . PONV (postoperative nausea and vomiting)   . Primary osteoarthritis of left knee 04/17/2020  . Scalp laceration 03/01/2020  . Sciatica     Past Surgical History:  Procedure Laterality Date  . ABDOMINAL HYSTERECTOMY  1994   TAH- DUB, BSO  . [place] bunionectomy     bilateral  . BREAST BIOPSY Left 1989  . BREAST LUMPECTOMY    . BREAST SURGERY N/A    Phreesia 09/14/2020  . BUNIONECTOMY Left 2005  . CARDIAC CATHETERIZATION  2010   +CAD  . CARDIAC CATHETERIZATION N/A 08/19/2015   Procedure: Left Heart Cath and Coronary Angiography;  Surgeon: Belva Crome, MD;  Location: [place] [facility];  Service: Cardiovascular;  Laterality: N/A;  . COLONOSCOPY  01/29/2007   LYY:TKPTWS rectum, left-sided diverticula, 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  Remainder of colonic mucosa was normal  . COLONOSCOPY WITH ESOPHAGOGASTRODUODENOSCOPY (EGD) N/A 02/01/2014   FKC:LEXN erosive reflux/gastric polyp/normal rectum/scattered left sided diverticula, normal distal TI. gastric bx negative, fundic gland polyp.  next TCS 01/2019.  Marland Kitchen CYSTECTOMY     pilonidial cyst  . ESOPHAGOGASTRODUODENOSCOPY     followed by colonoscopy with snare polypectomy  . EYE SURGERY Bilateral    cataract surgery with lens implants  . JOINT REPLACEMENT N/A    Phreesia 09/14/2020  . REVISION TOTAL HIP ARTHROPLASTY Right   . right knee replacement  02/2013  . SHOULDER ARTHROSCOPY Left 2005  . SHOULDER ARTHROSCOPY Right   . SPINAL FUSION    . SPINAL FUSION  2004   L4-L5  . SPINE SURGERY N/A    Phreesia 09/14/2020  . TOTAL HIP ARTHROPLASTY Right 06/2006  . TOTAL HIP ARTHROPLASTY  8/12  . TOTAL KNEE ARTHROPLASTY Left 04/18/2020  . TOTAL KNEE ARTHROPLASTY Left 04/18/2020   Procedure: LEFT TOTAL  KNEE ARTHROPLASTY;  Surgeon: Leandrew Koyanagi, MD;  Location: [place];  Service: Orthopedics;  Laterality: Left;  . TOTAL SHOULDER REPLACEMENT Left 08/2005    There were no vitals filed for this visit.    Subjective Assessment - 03/28/21 1125    Subjective Patient presents to therapy with complaint of LBP. She states the vertebrae above and below her lumbar fusion are "disintegrating". She says she also feels her hip and knee replacement are not working well together. She presents today with complaint of pain in lumbar spine and difficulty walking.    Pertinent History Lumbar fusion, bilateral THA, bilateral TKA    Limitations Lifting;Standing;Walking;House hold activities    Patient Stated Goals Get some home exercises to help my back    Currently in Pain? Yes    Pain Score 5     Pain Location Back    Pain Orientation Lower;Posterior    Pain Descriptors / Indicators Sharp;Aching;Dull    Pain Type Chronic pain    Pain Onset More than a month ago    Pain Frequency Constant    Aggravating Factors  sitting too long    Pain Relieving Factors walking, movement    Effect of Pain on Daily Activities Limits                03/28/21 0001  Assessment  Medical Diagnosis LBP  Referring Provider (PT) Dwana Melena Pa-C  Onset Date/Surgical Date  (07/2020)  Prior Therapy Yes  Precautions  Precautions None  Restrictions  Weight Bearing Restrictions No  Balance Screen  Has the patient fallen in the past 6 months No  [place] residence  Living Arrangements Alone  Available Help at Discharge Family  Prior Function  Level of Independence Independent  Cognition  Overall Cognitive Status Within Functional Limits for tasks assessed  Posture/Postural Control  Posture/Postural Control Postural limitations  Postural Limitations Flexed trunk;Rounded Shoulders;Forward head;Increased thoracic kyphosis  ROM / Strength  AROM / PROM / Strength Strength  Strength   Strength Assessment Site Hip;Knee;Ankle  Right/Left Knee Right;Left  Right Hip Flexion 4/5  Right Hip ABduction 4-/5  Right Hip ADduction 4+/5  Left Hip Flexion 4/5  Left Hip ABduction 4/5  Left Hip ADduction 4+/5  Right Knee Extension 4/5  Left Knee Extension 4/5  Ambulation/Gait  Ambulation/Gait Yes  Ambulation/Gait Assistance 6: Modified independent (Device/Increase time)  Assistive device Straight cane  Gait Pattern Step-to pattern;Decreased stride length;Trendelenburg;Trunk flexed  Ambulation Surface Level;Indoor         PT Education - 03/28/21 1129    Education Details on evaluation findings, POC and HEP    Person(s) Educated Patient    Methods Explanation;Handout    Comprehension Verbalized  understanding            PT Short Term Goals - 03/29/21 0920      PT SHORT TERM GOAL #1   Title Patient will be independent with HEP and self-management strategies to improve functional outcomes    Baseline Discussed and issued comprehensive HEP    Status Achieved                     Plan - 03/29/21 0905    Clinical Impression Statement Patient is a 80 y.o. female who presents to physical therapy with complaint of LBP. Patient demonstrates decreased strength, mobility restriction, and gait abnormalities which are likely contributing to symptoms of pain and are negatively impacting patient ability to perform ADLs and functional mobility tasks. Patient presents today for 1 x evaluation for home exercise program development to address pain and weakness.  Educated patient on and issued home exercise program to address pain and weakness.    Stability/Clinical Decision Making Stable/Uncomplicated    Clinical Decision Making Low    Rehab Potential Good    PT Frequency 1x / week    PT Treatment/Interventions ADLs/Self Care Home Management;Therapeutic exercise;Therapeutic activities;Patient/family education    PT Next Visit Plan 1 x eval only    PT Home Exercise Plan  clamshell, ab brace, ab march, bridge  (aquatic: standing hip abduction, heel raise, mini squat)    Consulted and Agree with Plan of Care Patient           Patient will benefit from skilled therapeutic intervention in order to improve the following deficits and impairments:  Pain,Abnormal gait,Decreased balance,Difficulty walking,Decreased activity tolerance,Decreased range of motion,Decreased strength,Hypomobility,Postural dysfunction,Improper body mechanics  Visit Diagnosis: Low back pain, unspecified back pain laterality, unspecified chronicity, unspecified whether sciatica present     Problem List Patient Active Problem List   Diagnosis Date Noted  . Dysphagia 11/02/2020  . Encounter for examination following treatment at hospital 07/21/2020  . Need for immunization against influenza 07/21/2020  . Degenerative scoliosis in adult patient 07/21/2020  . Abdominal pain, epigastric 08/14/2019  . Bipolar disorder ([place])   . Hypothyroidism 08/04/2015    Class: Diagnosis of  . Constipation 09/22/2013  . H/O adenomatous polyp of colon 09/22/2013  . Vitamin D deficiency 03/05/2013  . Insomnia 03/05/2013  . H/O chest pain 03/05/2013  . H/O bilateral hip replacements 03/05/2013  . HTN (hypertension) 10/15/2011  . Hyperlipidemia 05/11/2009  . Coronary artery disease 05/11/2009  . GERD 05/11/2009    9:31 AM, 03/29/21 Josue Hector PT DPT  Physical Therapist with [facility]  [phone] [address] [place], [place], [address] Phone: [phone]   Fax:  [phone]  Name: [name] [name] MRN: [id number] Date of Birth: 1941/09/02

## 2021-04-03 ENCOUNTER — Ambulatory Visit: Payer: Medicare PPO | Admitting: Internal Medicine

## 2021-04-03 ENCOUNTER — Other Ambulatory Visit (HOSPITAL_COMMUNITY)
Admission: RE | Admit: 2021-04-03 | Discharge: 2021-04-03 | Disposition: A | Discharge status: Home / Self Care | Payer: Medicare PPO | Source: Ambulatory Visit | Attending: Internal Medicine | Admitting: Internal Medicine

## 2021-04-03 ENCOUNTER — Encounter: Payer: Self-pay | Admitting: Internal Medicine

## 2021-04-03 ENCOUNTER — Other Ambulatory Visit: Payer: Self-pay

## 2021-04-03 VITALS — BP 110/68 | HR 104 | Ht <= 58 in | Wt 114.0 lb

## 2021-04-03 DIAGNOSIS — I10 Essential (primary) hypertension: Secondary | ICD-10-CM | POA: Diagnosis not present

## 2021-04-03 DIAGNOSIS — I2510 Atherosclerotic heart disease of native coronary artery without angina pectoris: Secondary | ICD-10-CM | POA: Insufficient documentation

## 2021-04-03 DIAGNOSIS — E782 Mixed hyperlipidemia: Secondary | ICD-10-CM | POA: Diagnosis not present

## 2021-04-03 LAB — CBC WITH DIFFERENTIAL/PLATELET
Abs Immature Granulocytes: 0.02 10*3/uL (ref 0.00–0.07)
Basophils Absolute: 0.1 10*3/uL (ref 0.0–0.1)
Basophils Relative: 1 %
Eosinophils Absolute: 0.2 10*3/uL (ref 0.0–0.5)
Eosinophils Relative: 2 %
HCT: 44.9 % (ref 36.0–46.0)
Hemoglobin: 14.5 g/dL (ref 12.0–15.0)
Immature Granulocytes: 0 %
Lymphocytes Relative: 19 %
Lymphs Abs: 1.7 10*3/uL (ref 0.7–4.0)
MCH: 33 pg (ref 26.0–34.0)
MCHC: 32.3 g/dL (ref 30.0–36.0)
MCV: 102.3 fL — ABNORMAL HIGH (ref 80.0–100.0)
Monocytes Absolute: 0.8 10*3/uL (ref 0.1–1.0)
Monocytes Relative: 8 %
Neutro Abs: 6.5 10*3/uL (ref 1.7–7.7)
Neutrophils Relative %: 70 %
Platelets: 295 10*3/uL (ref 150–400)
RBC: 4.39 MIL/uL (ref 3.87–5.11)
RDW: 13.6 % (ref 11.5–15.5)
WBC: 9.2 10*3/uL (ref 4.0–10.5)
nRBC: 0 % (ref 0.0–0.2)

## 2021-04-03 LAB — TSH: TSH: 4.86 u[IU]/mL — ABNORMAL HIGH (ref 0.350–4.500)

## 2021-04-03 LAB — BASIC METABOLIC PANEL
Anion gap: 11 (ref 5–15)
BUN: 16 mg/dL (ref 8–23)
CO2: 26 mmol/L (ref 22–32)
Calcium: 9.5 mg/dL (ref 8.9–10.3)
Chloride: 98 mmol/L (ref 98–111)
Creatinine, Ser: 0.83 mg/dL (ref 0.44–1.00)
GFR, Estimated: >60 mL/min (ref >60)
Glucose, Bld: 125 mg/dL — ABNORMAL HIGH (ref 70–99)
Potassium: 4.4 mmol/L (ref 3.5–5.1)
Sodium: 135 mmol/L (ref 135–145)

## 2021-04-03 LAB — LIPID PANEL
Cholesterol: 188 mg/dL (ref 0–200)
HDL: 95 mg/dL (ref >40)
LDL Cholesterol: 73 mg/dL (ref 0–99)
Total CHOL/HDL Ratio: 2 RATIO
Triglycerides: 102 mg/dL (ref <150)
VLDL: 20 mg/dL (ref 0–40)

## 2021-04-03 NOTE — Patient Instructions (Signed)
Medication Instructions:  Your physician recommends that you continue on your current medications as directed. Please refer to the Current Medication list given to you today.  *If you need a refill on your cardiac medications before your next appointment, please call your pharmacy*   Lab Work: Your physician recommends that you return for lab work in: Today ( BMET, CBC, Lipid, TSH)   If you have labs (blood work) drawn today and your tests are completely normal, you will receive your results only by: Marland Kitchen MyChart Message (if you have MyChart) OR . A paper copy in the mail If you have any lab test that is abnormal or we need to change your treatment, we will call you to review the results.   Testing/Procedures: NONE    Follow-Up: At [facility], you and your health needs are our priority.  As part of our continuing mission to provide you with exceptional heart care, we have created designated Provider Care Teams.  These Care Teams include your primary Cardiologist (physician) and Advanced Practice Providers (APPs -  Physician Assistants and Nurse Practitioners) who all work together to provide you with the care you need, when you need it.  We recommend signing up for the patient portal called "MyChart".  Sign up information is provided on this After Visit Summary.  MyChart is used to connect with patients for Virtual Visits (Telemedicine).  Patients are able to view lab/test results, encounter notes, upcoming appointments, etc.  Non-urgent messages can be sent to your provider as well.   To learn more about what you can do with MyChart, go to [email or web address].    Your next appointment:   1 year(s)  The format for your next appointment:   In Person  Provider:   Dorris Carnes, MD   Other Instructions Thank you for choosing [facility]!

## 2021-04-03 NOTE — Progress Notes (Signed)
Cardiology Office Note   Date:  04/03/2021   ID:  [name] [name], DOB 1941/03/09, MRN [id number]  PCP:  Perlie Mayo, NP  Cardiologist:  Dr. Harrington Challenger    No chief complaint on file.     History of Present Illness: [name] [name] is a 80 y.o. female with a hx of HTN, mild CAD (CAth 2016 mild nonobstructive CAD;  HL, DJD, hypothyroidism, fibromyalgia, and bipolar disorder   I saw her several years ago   She was last seen in cardiology by L INgold back in 2021 prior to knee surgery  Echo in March 2021 was normal  Since seen she has done OK from a cardiac standpont   Denis CP   Does have some SOB   ? If due to DJD/deconditioning.     Past Medical History:  Diagnosis Date  . Anemia   . Anxiety   . Arthritis   . BIPOLAR AFFECTIVE DISORDER 05/11/2009   Qualifier: Diagnosis of  By: Orville Govern CMA, Arbie Cookey    . Bipolar affective disorder ([place])   . Bipolar disorder ([place])   . Cataract   . Chest pain 05/11/2009   Qualifier: Diagnosis of  By: Orville Govern CMA, Arbie Cookey    . Chronic back pain   . Chronic cough 11/23/2014  . Chronic fatigue   . Chronic pain syndrome 03/05/2013  . Complication of anesthesia   . Constipation   . Depression   . Difficulty in walking(719.7) 07/26/2011  . Dizziness   . Dyslipidemia   . Fecal incontinence 11/23/2014  . Fibromyalgia   . GERD (gastroesophageal reflux disease)   . H/O anxiety disorder 03/05/2013   Overview:  Heart Cath x 2 per patient; most recent 4 yrs ago with "negative" findings per patient; Cardiologist, Dr Dorris Carnes following; last seen 10/2011; most recent episode 11/2012 with admit Forestine Na [facility]) and negative work-up per patient   . H/O: hysterectomy   . History of back surgery 03/05/2013  . History of gastroesophageal reflux (GERD) 03/05/2013  . History of kidney stones   . Hx of adenomatous colonic polyps 2005  . Hyperlipidemia   . Hypertension   . Hypothyroidism   . Knee pain   . Midsternal chest pain 08/19/2015  . Muscle weakness  (generalized) 04/27/2013  . Non-obstructive CAD    a. 08/2015 Cath: LM 40, LCX small, 30ost, 25m OM2 small, RCA nl/small, EF 55-65%.  . Osteopenia   . PONV (postoperative nausea and vomiting)   . Primary osteoarthritis of left knee 04/17/2020  . Scalp laceration 03/01/2020  . Sciatica     Past Surgical History:  Procedure Laterality Date  . ABDOMINAL HYSTERECTOMY  1994   TAH- DUB, BSO  . [place] bunionectomy     bilateral  . BREAST BIOPSY Left 1989  . BREAST LUMPECTOMY    . BREAST SURGERY N/A    Phreesia 09/14/2020  . BUNIONECTOMY Left 2005  . CARDIAC CATHETERIZATION  2010   +CAD  . CARDIAC CATHETERIZATION N/A 08/19/2015   Procedure: Left Heart Cath and Coronary Angiography;  Surgeon: HBelva Crome MD;  [facility];  Service: Cardiovascular;  Laterality: N/A;  . COLONOSCOPY  01/29/2007   ROYD:XAJOINrectum, left-sided diverticula, 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  Remainder of colonic mucosa was normal  . COLONOSCOPY WITH ESOPHAGOGASTRODUODENOSCOPY (EGD) N/A 02/01/2014   ROMV:EHMCerosive reflux/gastric polyp/normal rectum/scattered left sided diverticula, normal distal TI. gastric bx negative, fundic gland polyp. next TCS 01/2019.  .Marland KitchenCYSTECTOMY  pilonidial cyst  . ESOPHAGOGASTRODUODENOSCOPY     followed by colonoscopy with snare polypectomy  . EYE SURGERY Bilateral    cataract surgery with lens implants  . JOINT REPLACEMENT N/A    Phreesia 09/14/2020  . REVISION TOTAL HIP ARTHROPLASTY Right   . right knee replacement  02/2013  . SHOULDER ARTHROSCOPY Left 2005  . SHOULDER ARTHROSCOPY Right   . SPINAL FUSION    . SPINAL FUSION  2004   L4-L5  . SPINE SURGERY N/A    Phreesia 09/14/2020  . TOTAL HIP ARTHROPLASTY Right 06/2006  . TOTAL HIP ARTHROPLASTY  8/12  . TOTAL KNEE ARTHROPLASTY Left 04/18/2020  . TOTAL KNEE ARTHROPLASTY Left 04/18/2020   Procedure: LEFT TOTAL KNEE ARTHROPLASTY;  Surgeon: Leandrew Koyanagi, MD;  Location: [place];   Service: Orthopedics;  Laterality: Left;  . TOTAL SHOULDER REPLACEMENT Left 08/2005     Current Outpatient Medications  Medication Sig Dispense Refill  . ALPRAZolam (XANAX) 0.5 MG tablet Take 0.25-0.5 mg by mouth daily as needed for anxiety.     Marland Kitchen amoxicillin (AMOXIL) 500 MG capsule Take 4 pills one hour prior to dental work 8 capsule 0  . aspirin EC 81 MG tablet Take 81 mg by mouth daily. Swallow whole.    Marland Kitchen atorvastatin (LIPITOR) 40 MG tablet TAKE (1) TABLET BY MOUTH AT BEDTIME. Please make overdue appt with Dr. Harrington Challenger before anymore refills. Thank you 2nd attempt 30 tablet 0  . bisacodyl (DULCOLAX) 5 MG EC tablet Take 1 tablet by mouth daily as needed for mild constipation (constipation).     . Calcium Carb-Cholecalciferol (CALCIUM/VITAMIN D PO) Take 1 tablet by mouth daily.    . Cholecalciferol (VITAMIN D) 50 MCG (2000 UT) CAPS Take 2,000 Units by mouth daily.    . Cimetidine (TAGAMET PO) Take 1 tablet by mouth daily.    . DULoxetine (CYMBALTA) 60 MG capsule Take 120 mg by mouth daily.    Marland Kitchen estradiol (ESTRACE) 1 MG tablet TAKE 1/2 TABLET BY MOUTH DAILY. 30 tablet 11  . gabapentin (NEURONTIN) 100 MG capsule Take 100 mg by mouth 2 (two) times daily as needed.    . lamoTRIgine (LAMICTAL) 150 MG tablet Take 150 mg by mouth at bedtime.    Marland Kitchen levothyroxine (SYNTHROID) 25 MCG tablet Take 1 tablet (25 mcg total) by mouth daily. 90 tablet 1  . losartan (COZAAR) 50 MG tablet Take 1 tablet (50 mg total) by mouth daily. Please make yearly appt with Dr. Harrington Challenger for March 2022 for future refills. Thank you 1st attempt 90 tablet 0  . meloxicam (MOBIC) 7.5 MG tablet Take 1 tablet (7.5 mg total) by mouth daily as needed for up to 14 doses for pain. Do not start taking until finished with steroid taper 30 tablet 2  . Multiple Vitamin (MULTIVITAMIN) tablet Take 1 tablet by mouth daily.    . niacin (SLO-NIACIN) 500 MG tablet Take 500 mg by mouth daily.    . nitroGLYCERIN (NITROSTAT) 0.4 MG SL tablet PLACE 1 TAB  UNDER TONGUE EVERY 5 MIN IF NEEDED FOR CHEST PAIN. MAY USE 3 TIMES.NO RELIEF CALL 911. (Patient taking differently: Place 0.4 mg under the tongue every 5 (five) minutes as needed for chest pain. PLACE 1 TAB UNDER TONGUE EVERY 5 MIN IF NEEDED FOR CHEST PAIN. MAY USE 3 TIMES.NO RELIEF CALL 911.) 25 tablet 3  . Omega-3 Fatty Acids (FISH OIL) 1000 MG CAPS Take 1,000 mg by mouth daily.    . Probiotic Product (PROBIOTIC DAILY PO)  Take 1 tablet by mouth daily.     . risperiDONE (RISPERDAL) 1 MG tablet Take 0.5 mg by mouth at bedtime.    . WELLBUTRIN XL 300 MG 24 hr tablet Take 300 mg by mouth daily.     . methocarbamol (ROBAXIN) 500 MG tablet Take 1 tablet (500 mg total) by mouth 2 (two) times daily as needed. 20 tablet 0   No current facility-administered medications for this visit.    Allergies:   Hydrocodone-acetaminophen    Social History:  The patient  reports that she has never smoked. She has never used smokeless tobacco. She reports current alcohol use of about 7.0 standard drinks of alcohol per week. She reports that she does not use drugs.   Family History:  The patient's family history includes Cancer in her paternal grandmother; Heart attack in her father and mother; Heart disease in her father and mother; Stomach cancer in her maternal aunt; Stroke in her maternal grandfather and sister.    ROS:  General:no colds or fevers, no weight changes Skin:no rashes or ulcers HEENT:no blurred vision, no congestion CV:see HPI PUL:see HPI GI:no diarrhea constipation or melena, no indigestion GU:no hematuria, no dysuria MS:no joint pain, no claudication Neuro:no syncope, no lightheadedness Endo:no diabetes, no thyroid disease  Wt Readings from Last 3 Encounters:  04/03/21 114 lb (51.7 kg)  11/02/20 119 lb (54 kg)  10/18/20 115 lb (52.2 kg)     PHYSICAL EXAM: VS:  BP 110/68   Pulse (!) 104   Ht _0  (1.448 m)   Wt 114 lb (51.7 kg)   LMP 02/10/1993   SpO2 98%   BMI 24.67 kg/m  ,  BMI Body mass index is 24.67 kg/m. General:Pleasant affect, NAD Skin:Warm and dry,  HEENT:normocephalic, sclera clear, Neck: no JVD, no bruits  Heart:S1S2 RRR No signif murmurs   Lungs:clear without rales, rhonchi, or wheezes QIW:LNLG, non tender, + BS, do not palpate liver  Ext:no lower ext edema, 2+ pedal pulses, 2+ radial pulses Neuro:alert and oriented X 3, MAE, follows commands, + facial symmetry  Echo:   02/10/20  1. Left ventricular ejection fraction, by estimation, is 65 to 70%. The left ventricle has normal function. The left ventricle has no regional wall motion abnormalities. There is mild concentric left ventricular hypertrophy. Left ventricular diastolic parameters are consistent with Grade I diastolic dysfunction (impaired relaxation). Elevated left ventricular enddiastolic pressure. 2. Right ventricular systolic function is normal. The right ventricular size is normal. There is normal pulmonary artery systolic pressure. 3. Left atrial size was mildly dilated. 4. The mitral valve is degenerative. Trivial mitral valve regurgitation. 5. The aortic valve is tricuspid. Aortic valve regurgitation is not visualized. No aortic stenosis is present. 6. The inferior vena cava is normal in size with greater than 50% respiratory variability, suggesting right atrial pressure of 3 mmHg.  EKG:  EKG is not ordered today.   Recent Labs: 06/10/2020: TSH 3.940 07/12/2020: ALT 31; BUN 9; Creatinine, Ser 0.81; Hemoglobin 13.5; Platelets 263; Potassium 4.6; Sodium 134    Lipid Panel    Component Value Date/Time   CHOL 171 04/17/2019 0000   TRIG 127 04/17/2019 0000   HDL 67 04/17/2019 0000   CHOLHDL 1.9 05/08/2016 0952   VLDL 14 05/08/2016 0952   LDLCALC 79 04/17/2019 0000       Other studies Reviewed: Additional studies/ records that were reviewed today include: . Cardiac catheterization 08/19/2015  LM 40 LCx ostial 30, mid 32  Widely patent coronary arteries  with less than  50% obstruction in the ostial LAD, mild diffuse mid circumflex disease, mild proximal circumflex disease, and widely patent right coronary artery  Normal left ventricular size and function, with EF greater than 60%.  Echocardiogram 08/19/2015 EF 60-65, normal wall motion, grade 1 diastolic dysfunction, mild MAC, mild LAE, mild TR, PASP 32  Cardiac Catheterization 2010 RCA prox 20-30 LAD ostial 50-60 Normal EF   ASSESSMENT AND PLAN:  1. 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

## 2021-04-12 ENCOUNTER — Other Ambulatory Visit: Payer: Self-pay | Admitting: Internal Medicine

## 2021-04-18 ENCOUNTER — Ambulatory Visit (INDEPENDENT_AMBULATORY_CARE_PROVIDER_SITE_OTHER): Payer: Medicare PPO | Admitting: Orthopaedic Surgery

## 2021-04-18 DIAGNOSIS — M25571 Pain in right ankle and joints of right foot: Secondary | ICD-10-CM | POA: Insufficient documentation

## 2021-04-18 MED ORDER — PREDNISONE 10 MG (21) PO TBPK: ORAL_TABLET | ORAL | 0 refills | Status: DC

## 2021-04-18 NOTE — Progress Notes (Signed)
Office Visit Note   Patient: [name] [name]           Date of Birth: [birth year]           MRN: [id number] Visit Date: 04/18/2021              Requested by: Perlie Mayo, NP [address] [place],  [place] [address] PCP: Perlie Mayo, NP   Assessment & Plan: Visit Diagnoses:  1. Pain in right ankle and joints of right foot     Plan: Impression is right ankle extensor tenosynovitis.  I offered a cam boot but overall she is doing okay with regular shoes.  She will use compression and I sent in prescription for prednisone Dosepak.  RICE.  Follow-up as needed.  Follow-Up Instructions: Return if symptoms worsen or fail to improve.   Orders:  No orders of the defined types were placed in this encounter.  Meds ordered this encounter  Medications  . predniSONE (STERAPRED UNI-PAK 21 TAB) 10 MG (21) TBPK tablet    Sig: Take as directed    Dispense:  21 tablet    Refill:  0      Procedures: No procedures performed   Clinical Data: No additional findings.   Subjective: Chief Complaint  Patient presents with  . Lower Back - Follow-up    Pretty is a 80 year old female who follows up today for right ankle pain.  In terms of her back and knee replacement these have been stable and not of concern.  She states that the right ankle started hurting couple days ago when she was doing some stretching felt a pop in her ankle.  She says swelling since then.  Denies any constitutional symptoms.  She has been able to wear regular shoes and ambulate without significant discomfort.   Review of Systems  Constitutional: Negative.   HENT: Negative.   Eyes: Negative.   Respiratory: Negative.   Cardiovascular: Negative.   Endocrine: Negative.   Musculoskeletal: Negative.   Neurological: Negative.   Hematological: Negative.   Psychiatric/Behavioral: Negative.   All other systems reviewed and are negative.    Objective: Vital Signs: LMP 02/10/1993   Physical Exam Vitals and  nursing note reviewed.  Constitutional:      Appearance: She is well-developed.  Pulmonary:     Effort: Pulmonary effort is normal.  Skin:    General: Skin is warm.     Capillary Refill: Capillary refill takes less than 2 seconds.  Neurological:     Mental Status: She is alert and oriented to person, place, and time.  Psychiatric:        Behavior: Behavior normal.        Thought Content: Thought content normal.        Judgment: Judgment normal.     Ortho Exam Right ankle shows mild swelling across the anterior aspect overlying the extensor retinaculum.  I do not feel any soft tissue crepitus.  Ankle dorsiflexion and EHL function intact.  Sensation intact.  No pain with passive stretch and range of motion of the ankle. Specialty Comments:  No specialty comments available.  Imaging: No results found.   PMFS History: Patient Active Problem List   Diagnosis Date Noted  . Pain in right ankle and joints of right foot 04/18/2021  . Dysphagia 11/02/2020  . Encounter for examination following treatment at hospital 07/21/2020  . Need for immunization against influenza 07/21/2020  . Degenerative scoliosis in adult patient 07/21/2020  . Abdominal  pain, epigastric 08/14/2019  . Bipolar disorder ([place])   . Hypothyroidism 08/04/2015    Class: Diagnosis of  . Constipation 09/22/2013  . H/O adenomatous polyp of colon 09/22/2013  . Vitamin D deficiency 03/05/2013  . Insomnia 03/05/2013  . H/O chest pain 03/05/2013  . H/O bilateral hip replacements 03/05/2013  . HTN (hypertension) 10/15/2011  . Hyperlipidemia 05/11/2009  . Coronary artery disease 05/11/2009  . GERD 05/11/2009   Past Medical History:  Diagnosis Date  . Anemia   . Anxiety   . Arthritis   . BIPOLAR AFFECTIVE DISORDER 05/11/2009   Qualifier: Diagnosis of  By: Orville Govern CMA, Arbie Cookey    . Bipolar affective disorder ([place])   . Bipolar disorder ([place])   . Cataract   . Chest pain 05/11/2009   Qualifier: Diagnosis of  By: Orville Govern  CMA, Arbie Cookey    . Chronic back pain   . Chronic cough 11/23/2014  . Chronic fatigue   . Chronic pain syndrome 03/05/2013  . Complication of anesthesia   . Constipation   . Depression   . Difficulty in walking(719.7) 07/26/2011  . Dizziness   . Dyslipidemia   . Fecal incontinence 11/23/2014  . Fibromyalgia   . GERD (gastroesophageal reflux disease)   . H/O anxiety disorder 03/05/2013   Overview:  Heart Cath x 2 per patient; most recent 4 yrs ago with "negative" findings per patient; Cardiologist, Dr Dorris Carnes following; last seen 10/2011; most recent episode 11/2012 with admit Forestine [facility]) and negative work-up per patient   . H/O: hysterectomy   . History of back surgery 03/05/2013  . History of gastroesophageal reflux (GERD) 03/05/2013  . History of kidney stones   . Hx of adenomatous colonic polyps 2005  . Hyperlipidemia   . Hypertension   . Hypothyroidism   . Knee pain   . Midsternal chest pain 08/19/2015  . Muscle weakness (generalized) 04/27/2013  . Non-obstructive CAD    a. 08/2015 Cath: LM 40, LCX small, 30ost, 56m, OM2 small, RCA nl/small, EF 55-65%.  . Osteopenia   . PONV (postoperative nausea and vomiting)   . Primary osteoarthritis of left knee 04/17/2020  . Scalp laceration 03/01/2020  . Sciatica     Family History  Problem Relation Age of Onset  . Heart disease Mother   . Heart attack Mother   . Heart disease Father   . Heart attack Father   . Stroke Sister   . Stroke Maternal Grandfather   . Cancer Paternal Grandmother        unknown primary  . Stomach cancer Maternal Aunt   . Colon cancer Neg Hx     Past Surgical History:  Procedure Laterality Date  . ABDOMINAL HYSTERECTOMY  1994   TAH- DUB, BSO  . [place] bunionectomy     bilateral  . BREAST BIOPSY Left 1989  . BREAST LUMPECTOMY    . BREAST SURGERY N/A    Phreesia 09/14/2020  . BUNIONECTOMY Left 2005  . CARDIAC CATHETERIZATION  2010   +CAD  . CARDIAC CATHETERIZATION N/A 08/19/2015   Procedure:  Left Heart Cath and Coronary Angiography;  Surgeon: Belva Crome, MD;  Location: [facility];  Service: Cardiovascular;  Laterality: N/A;  . 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  Remainder of colonic mucosa was normal  . COLONOSCOPY WITH ESOPHAGOGASTRODUODENOSCOPY (EGD) N/A 02/01/2014   WUJ:WJXB erosive reflux/gastric polyp/normal rectum/scattered left sided diverticula, normal distal TI. gastric bx negative, fundic  gland polyp. next TCS 01/2019.  [facility] CYSTECTOMY     pilonidial cyst  . ESOPHAGOGASTRODUODENOSCOPY     followed by colonoscopy with snare polypectomy  . EYE SURGERY Bilateral    cataract surgery with lens implants  . JOINT REPLACEMENT N/A    Phreesia 09/14/2020  . REVISION TOTAL HIP ARTHROPLASTY Right   . right knee replacement  02/2013  . SHOULDER ARTHROSCOPY Left 2005  . SHOULDER ARTHROSCOPY Right   . SPINAL FUSION    . SPINAL FUSION  2004   L4-L5  . SPINE SURGERY N/A    Phreesia 09/14/2020  . TOTAL HIP ARTHROPLASTY Right 06/2006  . TOTAL HIP ARTHROPLASTY  8/12  . TOTAL KNEE ARTHROPLASTY Left 04/18/2020  . TOTAL KNEE ARTHROPLASTY Left 04/18/2020   Procedure: LEFT TOTAL KNEE ARTHROPLASTY;  Surgeon: Leandrew Koyanagi, MD;  Location: [facility];  Service: Orthopedics;  Laterality: Left;  . TOTAL SHOULDER REPLACEMENT Left 08/2005   Social History   Occupational History  . 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 Smoking status: Never Smoker  . Smokeless tobacco: Never Used  Vaping Use  . Vaping Use: Never used  Substance and Sexual Activity  . Alcohol use: Yes    Alcohol/week: 7.0 standard drinks    Types: 7 Glasses of wine per week    Comment: almost daily  . Drug use: No  . Sexual activity: Not Currently    Partners: Male    Birth control/protection: Surgical    Comment: TAH

## 2021-05-04 ENCOUNTER — Other Ambulatory Visit: Payer: Self-pay | Admitting: Internal Medicine

## 2021-05-04 NOTE — Telephone Encounter (Signed)
This is a [facility] pt.  °

## 2021-05-10 ENCOUNTER — Telehealth: Payer: Self-pay | Admitting: *Deleted

## 2021-05-10 NOTE — Telephone Encounter (Signed)
Ortho bundle 1 year call completed with Koos, jr. Survey.

## 2021-05-24 ENCOUNTER — Other Ambulatory Visit: Payer: Self-pay | Admitting: Internal Medicine

## 2021-06-21 ENCOUNTER — Other Ambulatory Visit: Payer: Self-pay

## 2021-06-21 ENCOUNTER — Telehealth: Payer: Self-pay

## 2021-06-21 DIAGNOSIS — E039 Hypothyroidism, unspecified: Secondary | ICD-10-CM

## 2021-06-21 MED ORDER — LEVOTHYROXINE SODIUM 25 MCG PO TABS: 25.0000 ug | ORAL_TABLET | Freq: Every day | ORAL | 1 refills | Status: DC

## 2021-06-21 NOTE — Telephone Encounter (Signed)
Patient needs refills on levothyroxine ph# [phone]

## 2021-06-21 NOTE — Telephone Encounter (Signed)
Rx sent 

## 2021-06-29 ENCOUNTER — Encounter: Payer: Self-pay | Admitting: Nurse Practitioner

## 2021-06-29 ENCOUNTER — Encounter: Payer: Medicare PPO | Admitting: Family Medicine

## 2021-06-29 ENCOUNTER — Ambulatory Visit (INDEPENDENT_AMBULATORY_CARE_PROVIDER_SITE_OTHER): Payer: Medicare PPO | Admitting: Nurse Practitioner

## 2021-06-29 ENCOUNTER — Other Ambulatory Visit: Payer: Self-pay

## 2021-06-29 VITALS — BP 131/78 | HR 71 | Temp 97.6°F | Ht <= 58 in | Wt 114.0 lb

## 2021-06-29 DIAGNOSIS — I2510 Atherosclerotic heart disease of native coronary artery without angina pectoris: Secondary | ICD-10-CM | POA: Diagnosis not present

## 2021-06-29 DIAGNOSIS — E782 Mixed hyperlipidemia: Secondary | ICD-10-CM

## 2021-06-29 DIAGNOSIS — E559 Vitamin D deficiency, unspecified: Secondary | ICD-10-CM

## 2021-06-29 DIAGNOSIS — Z0001 Encounter for general adult medical examination with abnormal findings: Secondary | ICD-10-CM | POA: Diagnosis not present

## 2021-06-29 DIAGNOSIS — I10 Essential (primary) hypertension: Secondary | ICD-10-CM

## 2021-06-29 DIAGNOSIS — E039 Hypothyroidism, unspecified: Secondary | ICD-10-CM | POA: Diagnosis not present

## 2021-06-29 NOTE — Progress Notes (Signed)
Established Patient Office Visit  Subjective:  Patient ID: [name] [name], female    DOB: 11/22/1940  Age: 80 y.o. MRN: [id number]  CC:  Chief Complaint  Patient presents with   Annual Exam    CPE    HPI [name] [name] presents for physical exam.  She has arthritis and fibromyalgia, but this is at baseline.  No acute concerns.  Past Medical History:  Diagnosis Date   Anemia    Anxiety    Arthritis    BIPOLAR AFFECTIVE DISORDER 05/11/2009   Qualifier: Diagnosis of  By: Orville Govern, CMA, Carol     Bipolar affective disorder [facility])    Bipolar disorder ([place])    Cataract    Chest pain 05/11/2009   Qualifier: Diagnosis of  By: Orville Govern, CMA, Carol     Chronic back pain    Chronic cough 11/23/2014   Chronic fatigue    Chronic pain syndrome 6/55/3748   Complication of anesthesia    Constipation    Depression    Difficulty in walking(719.7) 07/26/2011   Dizziness    Dyslipidemia    Fecal incontinence 11/23/2014   Fibromyalgia    GERD (gastroesophageal reflux disease)    H/O anxiety disorder 03/05/2013   Overview:  Heart Cath x 2 per patient; most recent 4 yrs ago with "negative" findings per patient; Cardiologist, Dr Dorris Carnes following; last seen 10/2011; most recent episode 11/2012 with admit Forestine [facility]) and negative work-up per patient    H/O: hysterectomy    History of back surgery 03/05/2013   History of gastroesophageal reflux (GERD) 03/05/2013   History of kidney stones    Hx of adenomatous colonic polyps 2005   Hyperlipidemia    Hypertension    Hypothyroidism    Knee pain    Midsternal chest pain 08/19/2015   Muscle weakness (generalized) 04/27/2013   Non-obstructive CAD    a. 08/2015 Cath: LM 40, LCX small, 30ost, 73m OM2 small, RCA nl/small, EF 55-65%.   Osteopenia    PONV (postoperative nausea and vomiting)    Primary osteoarthritis of left knee 04/17/2020   Scalp laceration 03/01/2020   Sciatica     Past Surgical History:  Procedure Laterality Date    ABDOMINAL HYSTERECTOMY  1994   TAH- DUB, BSO   [place] bunionectomy     bilateral   BREAST BIOPSY Left 1989   BREAST LUMPECTOMY     BREAST SURGERY N/A    Phreesia 09/14/2020   BUNIONECTOMY Left 2005   CARDIAC CATHETERIZATION  2010   +CAD   CARDIAC CATHETERIZATION N/A 08/19/2015   Procedure: Left Heart Cath and Coronary Angiography;  Surgeon: HBelva Crome MD;  [facility];  Service: Cardiovascular;  Laterality: N/A;   COLONOSCOPY  01/29/2007   ROLM:BEMLJQrectum, left-sided diverticula, 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  Remainder of colonic mucosa was normal   COLONOSCOPY WITH ESOPHAGOGASTRODUODENOSCOPY (EGD) N/A 02/01/2014   RGBE:EFEOerosive reflux/gastric polyp/normal rectum/scattered left sided diverticula, normal distal TI. gastric bx negative, fundic gland polyp. next TCS 01/2019.   CYSTECTOMY     pilonidial cyst   ESOPHAGOGASTRODUODENOSCOPY     followed by colonoscopy with snare polypectomy   EYE SURGERY Bilateral    cataract surgery with lens implants   JOINT REPLACEMENT N/A    Phreesia 09/14/2020   REVISION TOTAL HIP ARTHROPLASTY Right    right knee replacement  02/2013   SHOULDER ARTHROSCOPY Left 2005   SHOULDER ARTHROSCOPY Right    SPINAL FUSION  SPINAL FUSION  2004   L4-L5   SPINE SURGERY N/A    Phreesia 09/14/2020   TOTAL HIP ARTHROPLASTY Right 06/2006   TOTAL HIP ARTHROPLASTY  8/12   TOTAL KNEE ARTHROPLASTY Left 04/18/2020   TOTAL KNEE ARTHROPLASTY Left 04/18/2020   Procedure: LEFT TOTAL KNEE ARTHROPLASTY;  Surgeon: Leandrew Koyanagi, MD;  Location: [facility];  Service: Orthopedics;  Laterality: Left;   TOTAL SHOULDER REPLACEMENT Left 08/2005    Family History  Problem Relation Age of Onset   Heart disease Mother    Heart attack Mother    Heart disease Father    Heart attack Father    Stroke Sister    Stroke Maternal Grandfather    Cancer Paternal Grandmother        unknown primary   Stomach cancer Maternal Aunt    Colon  cancer Neg Hx     Social History   Socioeconomic History   Marital status: Widowed    Spouse name: Passed in Jan 2013   Number of children: 2   Years of education: Not on file   Highest education level: Master's degree (e.g., MA, MS, MEng, MEd, MSW, MBA)  Occupational History   Occupation: unemployed    Employer: RETIRED    Comment: retired Education officer, museum  Tobacco Use   Smoking status: Never   Smokeless tobacco: Never  Vaping Use   Vaping Use: Never used  Substance and Sexual Activity   Alcohol use: Yes    Alcohol/week: 7.0 standard drinks    Types: 7 Glasses of wine per week    Comment: almost daily   Drug use: No   Sexual activity: Not Currently    Partners: Male    Birth control/protection: Surgical    Comment: TAH  Other Topics Concern   Not on file  Social History Narrative   Lives alone   2 children, 9 grandchildren, 5 great grand   One in [place] and another [place]      Enjoy: reading;       Diet: eats all food groups    Caffeine: 2 cups of coffee, some tea   Water: 6-8 cups day        Wears seat belt    Does not use phone while driving   Oceanographer at home   No weapons    Social Determinants of Health   Financial Resource Strain: Low Risk    Difficulty of Paying Living Expenses: Not very hard  Food Insecurity: No Food Insecurity   Worried About Charity fundraiser in the Last Year: Never true   [place] in the Last Year: Never true  Transportation Needs: No Transportation Needs   Lack of Transportation (Medical): No   Lack of Transportation (Non-Medical): No  Physical Activity: Sufficiently Active   Days of Exercise per Week: 5 days   Minutes of Exercise per Session: 60 min  Stress: No Stress Concern Present   Feeling of Stress : Not at all  Social Connections: Socially Isolated   Frequency of Communication with Friends and Family: Three times a week   Frequency of Social Gatherings with Friends and Family: Twice a week    Attends Religious Services: Never   Marine scientist or Organizations: No   Attends Archivist Meetings: Never   Marital Status: Widowed  Intimate Partner Violence: Not At Risk   Fear of Current or Ex-Partner: No   Emotionally Abused: No   Physically Abused:  No   Sexually Abused: No    Outpatient Medications Prior to Visit  Medication Sig Dispense Refill   ALPRAZolam (XANAX) 0.5 MG tablet Take 0.25-0.5 mg by mouth daily as needed for anxiety.      aspirin EC 81 MG tablet Take 81 mg by mouth daily. Swallow whole.     atorvastatin (LIPITOR) 40 MG tablet TAKE (1) TABLET BY MOUTH AT BEDTIME. 90 tablet 3   bisacodyl (DULCOLAX) 5 MG EC tablet Take 1 tablet by mouth daily as needed for mild constipation (constipation).      Calcium Carb-Cholecalciferol (CALCIUM/VITAMIN D PO) Take 1 tablet by mouth daily.     Cholecalciferol (VITAMIN D) 50 MCG (2000 UT) CAPS Take 2,000 Units by mouth daily.     Cimetidine (TAGAMET PO) Take 1 tablet by mouth daily.     DULoxetine (CYMBALTA) 60 MG capsule Take 120 mg by mouth daily.     estradiol (ESTRACE) 1 MG tablet TAKE 1/2 TABLET BY MOUTH DAILY. 30 tablet 11   gabapentin (NEURONTIN) 100 MG capsule Take 100 mg by mouth 2 (two) times daily as needed.     lamoTRIgine (LAMICTAL) 150 MG tablet Take 150 mg by mouth at bedtime.     levothyroxine (SYNTHROID) 25 MCG tablet Take 1 tablet (25 mcg total) by mouth daily. 90 tablet 1   losartan (COZAAR) 50 MG tablet TAKE (1) TABLET BY MOUTH ONCE DAILY. 90 tablet 3   meloxicam (MOBIC) 7.5 MG tablet Take 1 tablet (7.5 mg total) by mouth daily as needed for up to 14 doses for pain. Do not start taking until finished with steroid taper 30 tablet 2   Multiple Vitamin (MULTIVITAMIN) tablet Take 1 tablet by mouth daily.     niacin (SLO-NIACIN) 500 MG tablet Take 500 mg by mouth daily.     nitroGLYCERIN (NITROSTAT) 0.4 MG SL tablet PLACE 1 TAB UNDER TONGUE EVERY 5 MIN IF NEEDED FOR CHEST PAIN. MAY USE 3 TIMES.NO  RELIEF CALL 911. (Patient taking differently: Place 0.4 mg under the tongue every 5 (five) minutes as needed for chest pain. PLACE 1 TAB UNDER TONGUE EVERY 5 MIN IF NEEDED FOR CHEST PAIN. MAY USE 3 TIMES.NO RELIEF CALL 911.) 25 tablet 3   Omega-3 Fatty Acids (FISH OIL) 1000 MG CAPS Take 1,000 mg by mouth daily.     Probiotic Product (PROBIOTIC DAILY PO) Take 1 tablet by mouth daily.      risperiDONE (RISPERDAL) 1 MG tablet Take 0.5 mg by mouth at bedtime.     WELLBUTRIN XL 300 MG 24 hr tablet Take 300 mg by mouth daily.      amoxicillin (AMOXIL) 500 MG capsule Take 4 pills one hour prior to dental work 8 capsule 0   methocarbamol (ROBAXIN) 500 MG tablet Take 1 tablet (500 mg total) by mouth 2 (two) times daily as needed. (Patient not taking: Reported on 06/29/2021) 20 tablet 0   predniSONE (STERAPRED UNI-PAK 21 TAB) 10 MG (21) TBPK tablet Take as directed (Patient not taking: Reported on 06/29/2021) 21 tablet 0   No facility-administered medications prior to visit.    Allergies  Allergen Reactions   Hydrocodone-Acetaminophen Hives and Itching    ROS Review of Systems  Constitutional: Negative.   HENT: Negative.    Eyes: Negative.   Respiratory: Negative.    Cardiovascular: Negative.   Gastrointestinal: Negative.   Endocrine: Negative.   Genitourinary: Negative.   Musculoskeletal:  Positive for arthralgias and myalgias.       Has chronic  joint/muscle pain from arthritis and fibromyalgia.  Skin: Negative.   Allergic/Immunologic: Negative.   Neurological: Negative.   Hematological: Negative.   Psychiatric/Behavioral: Negative.       Objective:    Physical Exam Constitutional:      Appearance: Normal appearance.  HENT:     Head: Normocephalic and atraumatic.     Right Ear: Tympanic membrane, ear canal and external ear normal.     Left Ear: Tympanic membrane, ear canal and external ear normal.     Nose: Nose normal.     Mouth/Throat:     Mouth: Mucous membranes are moist.      Pharynx: Oropharynx is clear.  Eyes:     Extraocular Movements: Extraocular movements intact.     Conjunctiva/sclera: Conjunctivae normal.     Pupils: Pupils are equal, round, and reactive to light.  Cardiovascular:     Rate and Rhythm: Normal rate and regular rhythm.     Pulses: Normal pulses.     Heart sounds: Normal heart sounds.  Pulmonary:     Effort: Pulmonary effort is normal.     Breath sounds: Normal breath sounds.  Abdominal:     General: Abdomen is flat. Bowel sounds are normal.     Palpations: Abdomen is soft.  Musculoskeletal:        General: Deformity present.     Cervical back: Normal range of motion and neck supple.     Comments: Ambulates with cane; Bouchard's nodes present  Skin:    General: Skin is warm and dry.     Capillary Refill: Capillary refill takes less than 2 seconds.  Neurological:     General: No focal deficit present.     Mental Status: She is alert and oriented to person, place, and time.     Cranial Nerves: No cranial nerve deficit.     Sensory: No sensory deficit.     Motor: No weakness.     Coordination: Coordination normal.     Gait: Gait abnormal.     Comments: Walks with cane  Psychiatric:        Mood and Affect: Mood normal.        Behavior: Behavior normal.        Thought Content: Thought content normal.        Judgment: Judgment normal.    BP 131/78 (BP Location: Left Arm, Patient Position: Sitting, Cuff Size: Normal)   Pulse 71   Temp 97.6 F (36.4 C) (Oral)   Ht '4\' 9"'  (1.448 m)   Wt 114 lb (51.7 kg)   LMP 02/10/1993   SpO2 96%   BMI 24.67 kg/m  Wt Readings from Last 3 Encounters:  06/29/21 114 lb (51.7 kg)  04/03/21 114 lb (51.7 kg)  11/02/20 119 lb (54 kg)     Health Maintenance Due  Topic Date Due   INFLUENZA VACCINE  06/12/2021    There are no preventive care reminders to display for this patient.  Lab Results  Component Value Date   TSH 4.860 (H) 04/03/2021   Lab Results  Component Value Date   WBC 9.2  04/03/2021   HGB 14.5 04/03/2021   HCT 44.9 04/03/2021   MCV 102.3 (H) 04/03/2021   PLT 295 04/03/2021   Lab Results  Component Value Date   NA 135 04/03/2021   K 4.4 04/03/2021   CO2 26 04/03/2021   GLUCOSE 125 (H) 04/03/2021   BUN 16 04/03/2021   CREATININE 0.83 04/03/2021   BILITOT 0.4 07/12/2020  ALKPHOS 72 07/12/2020   AST 31 07/12/2020   ALT 31 07/12/2020   PROT 7.2 07/12/2020   ALBUMIN 3.9 07/12/2020   CALCIUM 9.5 04/03/2021   ANIONGAP 11 04/03/2021   Lab Results  Component Value Date   CHOL 188 04/03/2021   Lab Results  Component Value Date   HDL 95 04/03/2021   Lab Results  Component Value Date   LDLCALC 73 04/03/2021   Lab Results  Component Value Date   TRIG 102 04/03/2021   Lab Results  Component Value Date   CHOLHDL 2.0 04/03/2021   Lab Results  Component Value Date   HGBA1C 5.4 04/17/2019      Assessment & Plan:   Problem List Items Addressed This Visit       Cardiovascular and Mediastinum   Coronary artery disease    -taking ASA daily      HTN (hypertension)    BP Readings from Last 3 Encounters:  06/29/21 131/78  04/03/21 110/68  11/02/20 138/89  -no changes to antihypertensives      Relevant Orders   CBC with Differential/Platelet   CMP14+EGFR   CBC with Differential/Platelet   CMP14+EGFR   Lipid Panel With LDL/HDL Ratio     Endocrine   Hypothyroidism    -checking thyroid labs      Relevant Orders   TSH + free T4   TSH + free T4     Other   Hyperlipidemia    Lab Results  Component Value Date   CHOL 188 04/03/2021   HDL 95 04/03/2021   LDLCALC 73 04/03/2021   TRIG 102 04/03/2021   CHOLHDL 2.0 04/03/2021        Vitamin D deficiency    -checking labs -she has been taking 2000 IU of vit D daily      Relevant Orders   VITAMIN D 25 Hydroxy (Vit-D Deficiency, Fractures)   Lipid Panel With LDL/HDL Ratio   Other Visit Diagnoses     Encounter for general adult medical examination with abnormal findings     -  Primary   Relevant Orders   CBC with Differential/Platelet   CMP14+EGFR   TSH + free T4   VITAMIN D 25 Hydroxy (Vit-D Deficiency, Fractures)       No orders of the defined types were placed in this encounter.   Follow-up: Return in about 6 months (around 12/30/2021) for Lab follow-up (thyroid, HLD, HTN).    Noreene Larsson, NP

## 2021-06-29 NOTE — Assessment & Plan Note (Signed)
Lab Results  Component Value Date   CHOL 188 04/03/2021   HDL 95 04/03/2021   LDLCALC 73 04/03/2021   TRIG 102 04/03/2021   CHOLHDL 2.0 04/03/2021

## 2021-06-29 NOTE — Assessment & Plan Note (Signed)
BP Readings from Last 3 Encounters:  06/29/21 131/78  04/03/21 110/68  11/02/20 138/89   -no changes to antihypertensives

## 2021-06-29 NOTE — Patient Instructions (Signed)
Please have labs drawn this morning.  Please have fasting labs drawn 2-3 days prior to your appointment so we can discuss the results during your office visit.

## 2021-06-29 NOTE — Assessment & Plan Note (Signed)
-  checking thyroid labs

## 2021-06-29 NOTE — Assessment & Plan Note (Signed)
-  taking ASA daily

## 2021-06-29 NOTE — Assessment & Plan Note (Signed)
-  checking labs -she has been taking 2000 IU of vit D daily

## 2021-06-30 ENCOUNTER — Other Ambulatory Visit: Payer: Self-pay | Admitting: Nurse Practitioner

## 2021-06-30 DIAGNOSIS — E039 Hypothyroidism, unspecified: Secondary | ICD-10-CM

## 2021-06-30 LAB — CBC WITH DIFFERENTIAL/PLATELET
Basophils Absolute: 0.1 10*3/uL (ref 0.0–0.2)
Basos: 1 %
EOS (ABSOLUTE): 0.3 10*3/uL (ref 0.0–0.4)
Eos: 5 %
Hematocrit: 39 % (ref 34.0–46.6)
Hemoglobin: 13.2 g/dL (ref 11.1–15.9)
Immature Grans (Abs): 0 10*3/uL (ref 0.0–0.1)
Immature Granulocytes: 0 %
Lymphocytes Absolute: 1.4 10*3/uL (ref 0.7–3.1)
Lymphs: 20 %
MCH: 32.6 pg (ref 26.6–33.0)
MCHC: 33.8 g/dL (ref 31.5–35.7)
MCV: 96 fL (ref 79–97)
Monocytes Absolute: 0.7 10*3/uL (ref 0.1–0.9)
Monocytes: 10 %
Neutrophils Absolute: 4.3 10*3/uL (ref 1.4–7.0)
Neutrophils: 64 %
Platelets: 239 10*3/uL (ref 150–450)
RBC: 4.05 x10E6/uL (ref 3.77–5.28)
RDW: 12.2 % (ref 11.7–15.4)
WBC: 6.8 10*3/uL (ref 3.4–10.8)

## 2021-06-30 LAB — CMP14+EGFR
ALT: 25 IU/L (ref 0–32)
AST: 30 IU/L (ref 0–40)
Albumin/Globulin Ratio: 2.2 (ref 1.2–2.2)
Albumin: 4.4 g/dL (ref 3.7–4.7)
Alkaline Phosphatase: 83 IU/L (ref 44–121)
BUN/Creatinine Ratio: 13 (ref 12–28)
BUN: 12 mg/dL (ref 8–27)
Bilirubin Total: 0.3 mg/dL (ref 0.0–1.2)
CO2: 25 mmol/L (ref 20–29)
Calcium: 9.8 mg/dL (ref 8.7–10.3)
Chloride: 97 mmol/L (ref 96–106)
Creatinine, Ser: 0.89 mg/dL (ref 0.57–1.00)
Globulin, Total: 2 g/dL (ref 1.5–4.5)
Glucose: 102 mg/dL — ABNORMAL HIGH (ref 65–99)
Potassium: 4.8 mmol/L (ref 3.5–5.2)
Sodium: 135 mmol/L (ref 134–144)
Total Protein: 6.4 g/dL (ref 6.0–8.5)
eGFR: 67 mL/min/{1.73_m2} (ref >59)

## 2021-06-30 LAB — VITAMIN D 25 HYDROXY (VIT D DEFICIENCY, FRACTURES): Vit D, 25-Hydroxy: 69.7 ng/mL (ref 30.0–100.0)

## 2021-06-30 LAB — TSH+FREE T4
Free T4: 1.25 ng/dL (ref 0.82–1.77)
TSH: 3.74 u[IU]/mL (ref 0.450–4.500)

## 2021-06-30 MED ORDER — LEVOTHYROXINE SODIUM 25 MCG PO TABS: 25.0000 ug | ORAL_TABLET | Freq: Every day | ORAL | 1 refills | Status: DC

## 2021-06-30 NOTE — Progress Notes (Signed)
Labs look great.

## 2021-07-19 DIAGNOSIS — K219 Gastro-esophageal reflux disease without esophagitis: Secondary | ICD-10-CM | POA: Diagnosis not present

## 2021-07-19 DIAGNOSIS — I10 Essential (primary) hypertension: Secondary | ICD-10-CM | POA: Diagnosis not present

## 2021-07-19 DIAGNOSIS — F419 Anxiety disorder, unspecified: Secondary | ICD-10-CM | POA: Diagnosis not present

## 2021-07-19 DIAGNOSIS — E039 Hypothyroidism, unspecified: Secondary | ICD-10-CM | POA: Diagnosis not present

## 2021-07-19 DIAGNOSIS — K5900 Constipation, unspecified: Secondary | ICD-10-CM | POA: Diagnosis not present

## 2021-07-19 DIAGNOSIS — M1990 Unspecified osteoarthritis, unspecified site: Secondary | ICD-10-CM | POA: Diagnosis not present

## 2021-07-19 DIAGNOSIS — E785 Hyperlipidemia, unspecified: Secondary | ICD-10-CM | POA: Diagnosis not present

## 2021-07-19 DIAGNOSIS — G8929 Other chronic pain: Secondary | ICD-10-CM | POA: Diagnosis not present

## 2021-07-19 DIAGNOSIS — F319 Bipolar disorder, unspecified: Secondary | ICD-10-CM | POA: Diagnosis not present

## 2021-08-24 DIAGNOSIS — F3175 Bipolar disorder, in partial remission, most recent episode depressed: Secondary | ICD-10-CM | POA: Diagnosis not present

## 2021-09-13 NOTE — Progress Notes (Signed)
Referring Provider: Perlie Mayo, NP Primary Care Physician:  Noreene Larsson, NP Primary GI Physician: Dr. Gala Romney  Chief Complaint  Patient presents with   Colonoscopy    Due for tcs    HPI:   [name] [name] is a 80 y.o. female presenting today to discuss re-scheduling colonoscopy due to history of colon polyps and scheduling EGD for dysphagia.  Last colonoscopy in 2015 with scattered left-sided diverticula, otherwise normal exam, recommended repeat in 5 years.  Last EGD March 2015 with mild erosive reflux esophagitis, gastric fundic gland polyp.  Negative gastric biopsies.   Last seen in our office 11/02/2020.  Reported solid food and pill dysphagia x1 year.  GERD fairly well controlled with cimetidine OTC.  Did not want to be on PPI.  Admitted to meloxicam and occasionally ibuprofen for arthritis and fibromyalgia.  Denied abdominal pain.  Intermittent constipation well controlled with bisacodyl couple times a week.  No alarm symptoms.  Plan included continuing cimetidine daily, EGD with possible dilation and colonoscopy, limit NSAIDs.   Patient called in January to cancel her procedures.  Today:  Doing well overall. Denies abdominal pain, brbpr, melena, unintentional weight loss. Bowel habits fluctute, but more trouble with constipation. This is chronic. Taking dulcolax PRN and has diarrhea after.   Having reflux symptoms daily. Had been out of the cimetidine for a while. Just started back yesterday. Would like to see if cimetidine will start working. Still with dysphagia about once a week. Usually with solid foods and dry foods like crackers. No trouble with soft or liquids. No regurgitation.   Meloxicam 4 times a week for fibromyalgia and arthritis.   Past Medical History:  Diagnosis Date   Anemia    Anxiety    Arthritis    BIPOLAR AFFECTIVE DISORDER 05/11/2009   Qualifier: Diagnosis of  By: Orville Govern, CMA, Carol     Bipolar affective disorder [facility])    Bipolar disorder ([place])     Cataract    Chest pain 05/11/2009   Qualifier: Diagnosis of  By: Orville Govern, CMA, Carol     Chronic back pain    Chronic cough 11/23/2014   Chronic fatigue    Chronic pain syndrome 5/62/1308   Complication of anesthesia    Constipation    Depression    Difficulty in walking(719.7) 07/26/2011   Dizziness    Dyslipidemia    Fecal incontinence 11/23/2014   Fibromyalgia    GERD (gastroesophageal reflux disease)    H/O anxiety disorder 03/05/2013   Overview:  Heart Cath x 2 per patient; most recent 4 yrs ago with "negative" findings per patient; Cardiologist, Dr Dorris Carnes following; last seen 10/2011; most recent episode 11/2012 with admit [facility]) and negative work-up per patient    H/O: hysterectomy    History of back surgery 03/05/2013   History of gastroesophageal reflux (GERD) 03/05/2013   History of kidney stones    Hx of adenomatous colonic polyps 2005   Hyperlipidemia    Hypertension    Hypothyroidism    Knee pain    Midsternal chest pain 08/19/2015   Muscle weakness (generalized) 04/27/2013   Non-obstructive CAD    a. 08/2015 Cath: LM 40, LCX small, 30ost, 27m, OM2 small, RCA nl/small, EF 55-65%.   Osteopenia    PONV (postoperative nausea and vomiting)    Primary osteoarthritis of left knee 04/17/2020   Scalp laceration 03/01/2020   Sciatica     Past Surgical History:  Procedure Laterality Date  ABDOMINAL HYSTERECTOMY  1994   TAH- DUB, BSO   [place] bunionectomy     bilateral   BREAST BIOPSY Left 1989   BREAST LUMPECTOMY     BREAST SURGERY N/A    [employer] 09/14/2020   BUNIONECTOMY Left 2005   CARDIAC CATHETERIZATION  2010   +CAD   CARDIAC CATHETERIZATION N/A 08/19/2015   Procedure: Left Heart Cath and Coronary Angiography;  Surgeon: Belva Crome, MD;  Location: [place] [facility];  Service: Cardiovascular;  Laterality: N/A;   COLONOSCOPY  01/29/2007   [facility]:WEXHBZ rectum, left-sided diverticula, 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   Remainder of colonic mucosa was normal   COLONOSCOPY WITH ESOPHAGOGASTRODUODENOSCOPY (EGD) N/A 02/01/2014   JIR:CVEL erosive reflux/gastric polyp/normal rectum/scattered left sided diverticula, normal distal TI. gastric bx negative, fundic gland polyp. next TCS 01/2019.   CYSTECTOMY     pilonidial cyst   ESOPHAGOGASTRODUODENOSCOPY     followed by colonoscopy with snare polypectomy   EYE SURGERY Bilateral    cataract surgery with lens implants   JOINT REPLACEMENT N/A    [employer] 09/14/2020   REVISION TOTAL HIP ARTHROPLASTY Right    right knee replacement  02/2013   SHOULDER ARTHROSCOPY Left 2005   SHOULDER ARTHROSCOPY Right    SPINAL FUSION     SPINAL FUSION  2004   L4-L5   SPINE SURGERY N/A    [employer] 09/14/2020   TOTAL HIP ARTHROPLASTY Right 06/2006   TOTAL HIP ARTHROPLASTY  8/12   TOTAL KNEE ARTHROPLASTY Left 04/18/2020   TOTAL KNEE ARTHROPLASTY Left 04/18/2020   Procedure: LEFT TOTAL KNEE ARTHROPLASTY;  Surgeon: Leandrew Koyanagi, MD;  Location: [place];  Service: Orthopedics;  Laterality: Left;   TOTAL SHOULDER REPLACEMENT Left 08/2005    Current Outpatient Medications  Medication Sig Dispense Refill   ALPRAZolam (XANAX) 0.5 MG tablet Take 0.25-0.5 mg by mouth daily as needed for anxiety.      aspirin EC 81 MG tablet Take 81 mg by mouth daily. Swallow whole.     atorvastatin (LIPITOR) 40 MG tablet TAKE (1) TABLET BY MOUTH AT BEDTIME. 90 tablet 3   bisacodyl (DULCOLAX) 5 MG EC tablet Take 1 tablet by mouth daily as needed for mild constipation (constipation).      Calcium Carb-Cholecalciferol (CALCIUM/VITAMIN D PO) Take 1 tablet by mouth daily.     Cholecalciferol (VITAMIN D) 50 MCG (2000 UT) CAPS Take 2,000 Units by mouth daily.     Cimetidine (TAGAMET PO) Take 1 tablet by mouth daily.     DULoxetine (CYMBALTA) 60 MG capsule Take 120 mg by mouth daily.     estradiol (ESTRACE) 1 MG tablet TAKE 1/2 TABLET BY MOUTH DAILY. 30 tablet 11   gabapentin (NEURONTIN) 100 MG capsule Take 100 mg  by mouth 2 (two) times daily as needed.     lamoTRIgine (LAMICTAL) 150 MG tablet Take 150 mg by mouth at bedtime.     levothyroxine (SYNTHROID) 25 MCG tablet Take 1 tablet (25 mcg total) by mouth daily. 90 tablet 1   losartan (COZAAR) 50 MG tablet TAKE (1) TABLET BY MOUTH ONCE DAILY. 90 tablet 3   meloxicam (MOBIC) 7.5 MG tablet Take 1 tablet (7.5 mg total) by mouth daily as needed for up to 14 doses for pain. Do not start taking until finished with steroid taper 30 tablet 2   Multiple Vitamin (MULTIVITAMIN) tablet Take 1 tablet by mouth daily.     niacin (SLO-NIACIN) 500 MG tablet Take 500 mg by mouth daily.  nitroGLYCERIN (NITROSTAT) 0.4 MG SL tablet PLACE 1 TAB UNDER TONGUE EVERY 5 MIN IF NEEDED FOR CHEST PAIN. MAY USE 3 TIMES.NO RELIEF CALL 911. (Patient taking differently: Place 0.4 mg under the tongue every 5 (five) minutes as needed for chest pain. PLACE 1 TAB UNDER TONGUE EVERY 5 MIN IF NEEDED FOR CHEST PAIN. MAY USE 3 TIMES.NO RELIEF CALL 911.) 25 tablet 3   Omega-3 Fatty Acids (FISH OIL) 1000 MG CAPS Take 1,000 mg by mouth daily.     Probiotic Product (PROBIOTIC DAILY PO) Take 1 tablet by mouth daily.      risperiDONE (RISPERDAL) 1 MG tablet Take 0.5 mg by mouth at bedtime.     WELLBUTRIN XL 300 MG 24 hr tablet Take 300 mg by mouth daily.      polyethylene glycol-electrolytes (NULYTELY) 420 g solution As directed 4000 mL 0   No current facility-administered medications for this visit.    Allergies as of 09/14/2021 - Review Complete 09/14/2021  Allergen Reaction Noted   Hydrocodone-acetaminophen Hives and Itching 07/05/2008    Family History  Problem Relation Age of Onset   Heart disease Mother    Heart attack Mother    Heart disease Father    Heart attack Father    Stroke Sister    Stroke Maternal Grandfather    Cancer Paternal Grandmother        unknown primary   Stomach cancer Maternal Aunt    Colon cancer Neg Hx     Social History   Socioeconomic History    Marital status: Widowed    Spouse name: Passed in Jan 2013   Number of children: 2   Years of education: Not on file   Highest education level: Master's degree (e.g., MA, MS, MEng, MEd, MSW, MBA)  Occupational History   Occupation: unemployed    Employer: RETIRED    Comment: retired Education officer, [employer]  Tobacco Use   Smoking status: Never   Smokeless tobacco: Never  Vaping Use   Vaping Use: Never used  Substance and Sexual Activity   Alcohol use: Yes    Alcohol/week: 7.0 standard drinks    Types: 7 Glasses of wine per week    Comment: almost daily- wine, 1 glass per day.   Drug use: No   Sexual activity: Not Currently    Partners: Male    Birth control/protection: Surgical    Comment: TAH  Other Topics Concern   Not on file  Social History Narrative   Lives alone   2 children, 9 grandchildren, 5 great grand   One in [place] and another [place] to [place]      Enjoy: reading;       Diet: eats all food groups    Caffeine: 2 cups of coffee, some tea   Water: 6-8 cups day        Wears seat belt    Does not use phone while driving   Oceanographer at home   No weapons    Social Determinants of Health   Financial Resource Strain: Low Risk    Difficulty of Paying Living Expenses: Not very hard  Food Insecurity: No Food Insecurity   Worried About Charity fundraiser in the Last Year: Never true   [place] in the Last Year: Never true  Transportation Needs: No Transportation Needs   Lack of Transportation (Medical): No   Lack of Transportation (Non-Medical): No  Physical Activity: Sufficiently Active   Days of Exercise per Week: 5  days   Minutes of Exercise per Session: 60 min  Stress: No Stress Concern Present   Feeling of Stress : Not at all  Social Connections: Socially Isolated   Frequency of Communication with Friends and Family: Three times a week   Frequency of Social Gatherings with Friends and Family: Twice a week   Attends Religious Services: Never    Marine scientist or Organizations: No   Attends Archivist Meetings: Never   Marital Status: Widowed    Review of Systems: Gen: Denies fever, chills, cold or flu like symptoms, pre-syncope, or syncope.  CV: Denies chest pain, palpitations. Resp: Denies dyspnea or cough.  GI: See HPI Heme: See HPI  Physical Exam: BP 125/76   Pulse 72   Temp (!) 96.9 F (36.1 C) (Temporal)   Ht 4\' 9"  (1.448 m)   Wt 116 lb (52.6 kg)   LMP 02/10/1993   BMI 25.10 kg/m  General:   Alert and oriented. No distress noted. Pleasant and cooperative.  Head:  Normocephalic and atraumatic. Eyes:  Conjuctiva clear without scleral icterus. Heart:  S1, S2 present without murmurs appreciated. Lungs:  Clear to auscultation bilaterally. No wheezes, rales, or rhonchi. No distress.  Abdomen:  +BS, soft, non-tender and non-distended. No rebound or guarding. No HSM or masses noted. Msk:  Symmetrical without gross deformities. Normal posture. Extremities:  Without edema. Neurologic:  Alert and  oriented x4 Psych:  Normal mood and affect.    Assessment: 80 y.o. female with history GERD, reflux esophagitis, colon polyps, chronic constipation, presenting today to discuss scheduling surveillance colonoscopy, dysphagia, and constipation.   History of colon polyps: Overdue for surveillance.  Last colonoscopy in 2015 with scattered left-sided diverticula, otherwise normal exam, recommended repeat in 5 years.  No alarm symptoms.  No family history of colon cancer.  Chronic constipation: Managing with over-the-counter laxatives which resulted in diarrhea.  Recommended starting Benefiber and Colace daily.  Call if ongoing symptoms.  Dysphagia: Solid food dysphagia about once a week.  This is in the setting of chronic GERD, currently uncontrolled, discussed below.  Differentials include esophagitis, esophageal web, ring, or stricture.  No esophageal dilation in the past.  Last EGD in 2015 with mild erosive  reflux esophagitis, patulous EG junction.  Recommend EGD with possible dilation for further evaluation and therapeutic intervention.  GERD: Chronic.  Uncontrolled.  Previously well controlled on cimetidine, but had been out for quite some time and just started again yesterday.  Patient prefers not to be on a PPI and would like to give cimetidine some time before making changes in medications.   Plan: EGD with possible dilation and colonoscopy with propofol with Dr. Gala Romney in the near future. The risks, benefits, and alternatives have been discussed with the patient in detail. The patient states understanding and desires to proceed. ASA 3 Start Benefiber 2 teaspoons daily x2 weeks then increase to twice daily. Start Colace 100-200 mg daily.  Decrease frequency if loose stools.  Call if ongoing constipation. Resume cimetidine daily.  Call if ongoing reflux symptoms. Avoid tough textures, chopped meats finely, eat slowly, take small bites, chew thoroughly, drink plenty of liquids throughout meals. Advise proceeding to the emergency room if something gets stuck in her esophagus and will not come up or go down. Follow-up after procedures.   Aliene Altes, PA-C [facility] 09/14/2021

## 2021-09-14 ENCOUNTER — Telehealth: Payer: Self-pay | Admitting: *Deleted

## 2021-09-14 ENCOUNTER — Other Ambulatory Visit: Payer: Self-pay

## 2021-09-14 ENCOUNTER — Encounter: Payer: Self-pay | Admitting: Gastroenterology

## 2021-09-14 ENCOUNTER — Ambulatory Visit: Payer: Medicare PPO | Admitting: Gastroenterology

## 2021-09-14 VITALS — BP 125/76 | HR 72 | Temp 96.9°F | Ht <= 58 in | Wt 116.0 lb

## 2021-09-14 DIAGNOSIS — Z86010 Personal history of colonic polyps: Secondary | ICD-10-CM | POA: Diagnosis not present

## 2021-09-14 DIAGNOSIS — K219 Gastro-esophageal reflux disease without esophagitis: Secondary | ICD-10-CM | POA: Diagnosis not present

## 2021-09-14 DIAGNOSIS — R1310 Dysphagia, unspecified: Secondary | ICD-10-CM | POA: Diagnosis not present

## 2021-09-14 DIAGNOSIS — K5900 Constipation, unspecified: Secondary | ICD-10-CM | POA: Diagnosis not present

## 2021-09-14 MED ORDER — PEG 3350-KCL-NA BICARB-NACL 420 G PO SOLR: ORAL | 0 refills | Status: DC

## 2021-09-14 NOTE — Patient Instructions (Signed)
We will arrange for you to have an upper endoscopy with possible stretching of your esophagus and colonoscopy in the near future with Dr. Gala Romney.  For trouble with swallowing: Avoid tough textures. Chopped meats finely. Eat slowly, take small bites, chew thoroughly, drink plenty of liquids throughout meals. If something gets stuck in your esophagus and will not come up or go down, proceed to the emergency room.  For reflux: Resume cimetidine daily and let me know if this does not control your symptoms well.  For constipation: Start Benefiber 2 teaspoons daily x2 weeks then increase to twice daily. Start Colace (docusate sodium) 100-200 mg daily.  If you develop loose stools, you can decrease dose/frequency. If Colace does not work well for you, please let me know and I will provide additional recommendations.   It was great to see you again today!  We will plan to see you back in the office after your procedures.  Do not hesitate to call if you have any questions or concerns prior to your next visit.   Aliene Altes, PA-C [facility]

## 2021-09-14 NOTE — Telephone Encounter (Signed)
Spoke with pt. She has been scheduled for TCS/EGD +/-DIL with propofol, asa 3 with Dr. Gala Romney on 12/1. Aware will mail prep instructions and send Rx to pharmacy. Will also let her know when pre-op will be scheduled for.   PA approved via [employer]. Auth# [id number], DOS: Oct 12 2021 - Nov 11 2021

## 2021-09-15 ENCOUNTER — Encounter: Payer: Self-pay | Admitting: *Deleted

## 2021-09-19 ENCOUNTER — Encounter: Payer: Medicare PPO | Admitting: Family Medicine

## 2021-10-03 ENCOUNTER — Other Ambulatory Visit: Payer: Self-pay

## 2021-10-03 ENCOUNTER — Ambulatory Visit (INDEPENDENT_AMBULATORY_CARE_PROVIDER_SITE_OTHER): Payer: Medicare PPO

## 2021-10-03 DIAGNOSIS — Z0000 Encounter for general adult medical examination without abnormal findings: Secondary | ICD-10-CM

## 2021-10-03 DIAGNOSIS — Z1231 Encounter for screening mammogram for malignant neoplasm of breast: Secondary | ICD-10-CM

## 2021-10-03 DIAGNOSIS — M8580 Other specified disorders of bone density and structure, unspecified site: Secondary | ICD-10-CM

## 2021-10-03 NOTE — Patient Instructions (Signed)
[name] , Thank you for taking time to come for your Medicare Wellness Visit. I appreciate your ongoing commitment to your health goals. Please review the following plan we discussed and let me know if I can assist you in the future.   These are the goals we discussed:  Goals      Patient Stated     Stay healthy and keep going.     Social and Functional Skills Optimized     Evidence-based guidance:  Assess level of social support; promote maintaining links with family, friends and community to reduce social isolation.  Assess level of function related to basic activities of daily living that include eating, dressing, bathing, as well as instrumental activities of daily living such as shopping, managing finances and use of devices.  Encourage continuation of daily life components such as self-care, home maintenance, financial management, volunteer activities, education opportunities and hobbies.  Refer to occupational or physical therapy to develop comprehensive rehabilitation plan to improve or maintain activities of daily living; consider inclusion of endurance, balance and resistance-training.  Consider complementary therapy such as yoga, music, gardening, outdoor activities, aromatherapy and tai chi.   Notes: "Getting through the holiday's"         This is a list of the screening recommended for you and due dates:  Health Maintenance  Topic Date Due   COVID-19 Vaccine (5 - Booster for Moderna series) 04/10/2021   Tetanus Vaccine  01/13/2030   Pneumonia Vaccine  Completed   Flu Shot  Completed   DEXA scan (bone density measurement)  Completed   Hepatitis C Screening: USPSTF Recommendation to screen - Ages 57-78 yo.  Completed   Zoster (Shingles) Vaccine  Completed   HPV Vaccine  Aged Out   Colon Cancer Screening  Discontinued   [name] , Thank you for taking time to come for your Medicare Wellness Visit. I appreciate your ongoing commitment to your health goals. Please  review the following plan we discussed and let me know if I can assist you in the future.   Screening recommendations/referrals: Colonoscopy: COMPLETE Mammogram: ORDERED Bone Density: ORDERED Recommended yearly ophthalmology/optometry visit for glaucoma screening and checkup Recommended yearly dental visit for hygiene and checkup  Vaccinations: Influenza vaccine: COMPLETE Pneumococcal vaccine: COMPLETE Tdap vaccine: COMPLETE Shingles vaccine: COMPLETE    Advanced directives: YES  Conditions/risks identified: HYPERTENSION   Next appointment: 1 YEAR   Preventive Care 65 Years and Older, Female Preventive care refers to lifestyle choices and visits with your health care provider that can promote health and wellness. What does preventive care include? A yearly physical exam. This is also called an annual well check. Dental exams once or twice a year. Routine eye exams. Ask your health care provider how often you should have your eyes checked. Personal lifestyle choices, including: Daily care of your teeth and gums. Regular physical activity. Eating a healthy diet. Avoiding tobacco and drug use. Limiting alcohol use. Practicing safe sex. Taking low-dose aspirin every day. Taking vitamin and mineral supplements as recommended by your health care provider. What happens during an annual well check? The services and screenings done by your health care provider during your annual well check will depend on your age, overall health, lifestyle risk factors, and family history of disease. Counseling  Your health care provider may ask you questions about your: Alcohol use. Tobacco use. Drug use. Emotional well-being. Home and relationship well-being. Sexual activity. Eating habits. History of falls. Memory and ability to understand (cognition). Work  and work environment. Reproductive health. Screening  You may have the following tests or measurements: Height, weight, and  BMI. Blood pressure. Lipid and cholesterol levels. These may be checked every 5 years, or more frequently if you are over 13 years old. Skin check. Lung cancer screening. You may have this screening every year starting at age 38 if you have a 30-pack-year history of smoking and currently smoke or have quit within the past 15 years. Fecal occult blood test (FOBT) of the stool. You may have this test every year starting at age 56. Flexible sigmoidoscopy or colonoscopy. You may have a sigmoidoscopy every 5 years or a colonoscopy every 10 years starting at age 26. Hepatitis C blood test. Hepatitis B blood test. Sexually transmitted disease (STD) testing. Diabetes screening. This is done by checking your blood sugar (glucose) after you have not eaten for a while (fasting). You may have this done every 1-3 years. Bone density scan. This is done to screen for osteoporosis. You may have this done starting at age 78. Mammogram. This may be done every 1-2 years. Talk to your health care provider about how often you should have regular mammograms. Talk with your health care provider about your test results, treatment options, and if necessary, the need for more tests. Vaccines  Your health care provider may recommend certain vaccines, such as: Influenza vaccine. This is recommended every year. Tetanus, diphtheria, and acellular pertussis (Tdap, Td) vaccine. You may need a Td booster every 10 years. Zoster vaccine. You may need this after age 19. Pneumococcal 13-valent conjugate (PCV13) vaccine. One dose is recommended after age 15. Pneumococcal polysaccharide (PPSV23) vaccine. One dose is recommended after age 43. Talk to your health care provider about which screenings and vaccines you need and how often you need them. This information is not intended to replace advice given to you by your health care provider. Make sure you discuss any questions you have with your health care provider. Document  Released: 11/25/2015 Document Revised: 07/18/2016 Document Reviewed: 08/30/2015 Elsevier Interactive Patient Education  2017 [place] Prevention in the Home Falls can cause injuries. They can happen to people of all ages. There are many things you can do to make your home safe and to help prevent falls. What can I do on the outside of my home? Regularly fix the edges of walkways and driveways and fix any cracks. Remove anything that might make you trip as you walk through a door, such as a raised step or threshold. Trim any bushes or trees on the path to your home. Use bright outdoor lighting. Clear any walking paths of anything that might make someone trip, such as rocks or tools. Regularly check to see if handrails are loose or broken. Make sure that both sides of any steps have handrails. Any raised decks and porches should have guardrails on the edges. Have any leaves, snow, or ice cleared regularly. Use sand or salt on walking paths during winter. Clean up any spills in your garage right away. This includes oil or grease spills. What can I do in the bathroom? Use night lights. Install grab bars by the toilet and in the tub and shower. Do not use towel bars as grab bars. Use non-skid mats or decals in the tub or shower. If you need to sit down in the shower, use a plastic, non-slip stool. Keep the floor dry. Clean up any water that spills on the floor as soon as it happens. Remove soap  buildup in the tub or shower regularly. Attach bath mats securely with double-sided non-slip rug tape. Do not have throw rugs and other things on the floor that can make you trip. What can I do in the bedroom? Use night lights. Make sure that you have a light by your bed that is easy to reach. Do not use any sheets or blankets that are too big for your bed. They should not hang down onto the floor. Have a firm chair that has side arms. You can use this for support while you get dressed. Do  not have throw rugs and other things on the floor that can make you trip. What can I do in the kitchen? Clean up any spills right away. Avoid walking on wet floors. Keep items that you use a lot in easy-to-reach places. If you need to reach something above you, use a strong step stool that has a grab bar. Keep electrical cords out of the way. Do not use floor polish or wax that makes floors slippery. If you must use wax, use non-skid floor wax. Do not have throw rugs and other things on the floor that can make you trip. What can I do with my stairs? Do not leave any items on the stairs. Make sure that there are handrails on both sides of the stairs and use them. Fix handrails that are broken or loose. Make sure that handrails are as long as the stairways. Check any carpeting to make sure that it is firmly attached to the stairs. Fix any carpet that is loose or worn. Avoid having throw rugs at the top or bottom of the stairs. If you do have throw rugs, attach them to the floor with carpet tape. Make sure that you have a light switch at the top of the stairs and the bottom of the stairs. If you do not have them, ask someone to add them for you. What else can I do to help prevent falls? Wear shoes that: Do not have high heels. Have rubber bottoms. Are comfortable and fit you well. Are closed at the toe. Do not wear sandals. If you use a stepladder: Make sure that it is fully opened. Do not climb a closed stepladder. Make sure that both sides of the stepladder are locked into place. Ask someone to hold it for you, if possible. Clearly mark and make sure that you can see: Any grab bars or handrails. First and last steps. Where the edge of each step is. Use tools that help you move around (mobility aids) if they are needed. These include: Canes. Walkers. Scooters. Crutches. Turn on the lights when you go into a dark area. Replace any light bulbs as soon as they burn out. Set up your  furniture so you have a clear path. Avoid moving your furniture around. If any of your floors are uneven, fix them. If there are any pets around you, be aware of where they are. Review your medicines with your doctor. Some medicines can make you feel dizzy. This can increase your chance of falling. Ask your doctor what other things that you can do to help prevent falls. This information is not intended to replace advice given to you by your health care provider. Make sure you discuss any questions you have with your health care provider. Document Released: 08/25/2009 Document Revised: 04/05/2016 Document Reviewed: 12/03/2014 Elsevier Interactive Patient Education  2017 [employer].

## 2021-10-03 NOTE — Progress Notes (Signed)
Subjective:   [name] [name] is a 80 y.o. female who presents for Medicare Annual (Subsequent) preventive examination.  I connected with  [name] [name] on 10/03/21 by a audio enabled telemedicine application and verified that I am speaking with the correct person using two identifiers.  Patient Location: Home  Provider Location: Office/Clinic  I discussed the limitations of evaluation and management by telemedicine. The patient expressed understanding and agreed to proceed.   Review of Systems     [name] [name] , Thank you for taking time to come for your Medicare Wellness Visit. I appreciate your ongoing commitment to your health goals. Please review the following plan we discussed and let me know if I can assist you in the future.   These are the goals we discussed:  Goals      Patient Stated     Stay healthy and keep going.     Social and Functional Skills Optimized     Evidence-based guidance:  Assess level of social support; promote maintaining links with family, friends and community to reduce social isolation.  Assess level of function related to basic activities of daily living that include eating, dressing, bathing, as well as instrumental activities of daily living such as shopping, managing finances and use of devices.  Encourage continuation of daily life components such as self-care, home maintenance, financial management, volunteer activities, education opportunities and hobbies.  Refer to occupational or physical therapy to develop comprehensive rehabilitation plan to improve or maintain activities of daily living; consider inclusion of endurance, balance and resistance-training.  Consider complementary therapy such as yoga, music, gardening, outdoor activities, aromatherapy and tai chi.   Notes: "Getting through the holiday's"         This is a list of the screening recommended for you and due dates:  Health Maintenance  Topic Date Due   COVID-19 Vaccine (5 -  Booster for Moderna series) 04/10/2021   Tetanus Vaccine  01/13/2030   Pneumonia Vaccine  Completed   Flu Shot  Completed   DEXA scan (bone density measurement)  Completed   Hepatitis C Screening: USPSTF Recommendation to screen - Ages 51-79 yo.  Completed   Zoster (Shingles) Vaccine  Completed   HPV Vaccine  Aged Out   Colon Cancer Screening  Discontinued    Cardiac Risk Factors include: none     Objective:    Today's Vitals   10/03/21 0859 10/03/21 0901  PainSc: 0-No pain 0-No pain   There is no height or weight on file to calculate BMI.  Advanced Directives 10/03/2021 03/28/2021 09/15/2020 04/18/2020 04/17/2020 04/14/2020 01/19/2020  Does Patient Have a Medical Advance Directive? Yes Yes No;Yes Yes Yes Yes Yes  Type of Academic librarian - Living will [place];Living will - [place];Living will [place];Living will  Does patient want to make changes to medical advance directive? Yes (Inpatient - patient defers changing a medical advance directive at this time - Information given) Yes (ED - Information included in AVS) - No - Patient declined - No - Patient declined -  Copy of [place] in Chart? No - copy requested - - No - copy requested - - -  Would patient like information on creating a medical advance directive? - - No - Patient declined - - - -  Pre-existing out of facility DNR order (yellow form or pink MOST form) - - - - - - -  Current Medications (verified) Outpatient Encounter Medications as of 10/03/2021  Medication Sig   ALPRAZolam (XANAX) 0.5 MG tablet Take 0.25-0.5 mg by mouth daily as needed for anxiety.    aspirin EC 81 MG tablet Take 81 mg by mouth daily. Swallow whole.   atorvastatin (LIPITOR) 40 MG tablet TAKE (1) TABLET BY MOUTH AT BEDTIME.   bisacodyl (DULCOLAX) 5 MG EC tablet Take 1 tablet by mouth daily as needed for mild constipation (constipation).     Calcium Carb-Cholecalciferol (CALCIUM/VITAMIN D PO) Take 1 tablet by mouth daily.   Cholecalciferol (VITAMIN D) 50 MCG (2000 UT) CAPS Take 2,000 Units by mouth daily.   Cimetidine (TAGAMET PO) Take 1 tablet by mouth daily.   DULoxetine (CYMBALTA) 60 MG capsule Take 120 mg by mouth daily.   estradiol (ESTRACE) 1 MG tablet TAKE 1/2 TABLET BY MOUTH DAILY.   gabapentin (NEURONTIN) 100 MG capsule Take 100 mg by mouth 2 (two) times daily as needed.   lamoTRIgine (LAMICTAL) 150 MG tablet Take 150 mg by mouth at bedtime.   levothyroxine (SYNTHROID) 25 MCG tablet Take 1 tablet (25 mcg total) by mouth daily.   losartan (COZAAR) 50 MG tablet TAKE (1) TABLET BY MOUTH ONCE DAILY.   meloxicam (MOBIC) 7.5 MG tablet Take 1 tablet (7.5 mg total) by mouth daily as needed for up to 14 doses for pain. Do not start taking until finished with steroid taper   Multiple Vitamin (MULTIVITAMIN) tablet Take 1 tablet by mouth daily.   niacin (SLO-NIACIN) 500 MG tablet Take 500 mg by mouth daily.   nitroGLYCERIN (NITROSTAT) 0.4 MG SL tablet PLACE 1 TAB UNDER TONGUE EVERY 5 MIN IF NEEDED FOR CHEST PAIN. MAY USE 3 TIMES.NO RELIEF CALL 911. (Patient taking differently: Place 0.4 mg under the tongue every 5 (five) minutes as needed for chest pain. PLACE 1 TAB UNDER TONGUE EVERY 5 MIN IF NEEDED FOR CHEST PAIN. MAY USE 3 TIMES.NO RELIEF CALL 911.)   Omega-3 Fatty Acids (FISH OIL) 1000 MG CAPS Take 1,000 mg by mouth daily.   polyethylene glycol-electrolytes (NULYTELY) 420 g solution As directed   Probiotic Product (PROBIOTIC DAILY PO) Take 1 tablet by mouth daily.    risperiDONE (RISPERDAL) 1 MG tablet Take 0.5 mg by mouth at bedtime.   WELLBUTRIN XL 300 MG 24 hr tablet Take 300 mg by mouth daily.    No facility-administered encounter medications on file as of 10/03/2021.    Allergies (verified) Hydrocodone-acetaminophen   History: Past Medical History:  Diagnosis Date   Anemia    Anxiety    Arthritis    BIPOLAR AFFECTIVE  DISORDER 05/11/2009   Qualifier: Diagnosis of  By: Orville Govern, CMA, Carol     Bipolar affective disorder [facility])    Bipolar disorder ([place])    Cataract    Chest pain 05/11/2009   Qualifier: Diagnosis of  By: Orville Govern, CMA, Carol     Chronic back pain    Chronic cough 11/23/2014   Chronic fatigue    Chronic pain syndrome 1/57/2620   Complication of anesthesia    Constipation    Depression    Difficulty in walking(719.7) 07/26/2011   Dizziness    Dyslipidemia    Fecal incontinence 11/23/2014   Fibromyalgia    GERD (gastroesophageal reflux disease)    H/O anxiety disorder 03/05/2013   Overview:  Heart Cath x 2 per patient; most recent 4 yrs ago with "negative" findings per patient; Cardiologist, Dr Dorris Carnes following; last seen 10/2011; most recent episode 11/2012 with  admit [facility]) and negative work-up per patient    H/O: hysterectomy    History of back surgery 03/05/2013   History of gastroesophageal reflux (GERD) 03/05/2013   History of kidney stones    Hx of adenomatous colonic polyps 2005   Hyperlipidemia    Hypertension    Hypothyroidism    Knee pain    Midsternal chest pain 08/19/2015   Muscle weakness (generalized) 04/27/2013   Non-obstructive CAD    a. 08/2015 Cath: LM 40, LCX small, 30ost, 89m OM2 small, RCA nl/small, EF 55-65%.   Osteopenia    PONV (postoperative nausea and vomiting)    Primary osteoarthritis of left knee 04/17/2020   Scalp laceration 03/01/2020   Sciatica    Past Surgical History:  Procedure Laterality Date   ABDOMINAL HYSTERECTOMY  1994   TAH- DUB, BSO   [place] bunionectomy     bilateral   BREAST BIOPSY Left 1989   BREAST LUMPECTOMY     BREAST SURGERY N/A    [employer] 09/14/2020   BUNIONECTOMY Left 2005   CARDIAC CATHETERIZATION  2010   +CAD   CARDIAC CATHETERIZATION N/A 08/19/2015   Procedure: Left Heart Cath and Coronary Angiography;  Surgeon: HBelva Crome MD;  [facility];  Service: Cardiovascular;  Laterality: N/A;    COLONOSCOPY  01/29/2007   RGHW:EXHBZJrectum, left-sided diverticula, 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  Remainder of colonic mucosa was normal   COLONOSCOPY WITH ESOPHAGOGASTRODUODENOSCOPY (EGD) N/A 02/01/2014   RIRC:VELFerosive reflux/gastric polyp/normal rectum/scattered left sided diverticula, normal distal TI. gastric bx negative, fundic gland polyp. next TCS 01/2019.   CYSTECTOMY     pilonidial cyst   ESOPHAGOGASTRODUODENOSCOPY     followed by colonoscopy with snare polypectomy   EYE SURGERY Bilateral    cataract surgery with lens implants   JOINT REPLACEMENT N/A    [employer] 09/14/2020   REVISION TOTAL HIP ARTHROPLASTY Right    right knee replacement  02/2013   SHOULDER ARTHROSCOPY Left 2005   SHOULDER ARTHROSCOPY Right    SPINAL FUSION     SPINAL FUSION  2004   L4-L5   SPINE SURGERY N/A    [employer] 09/14/2020   TOTAL HIP ARTHROPLASTY Right 06/2006   TOTAL HIP ARTHROPLASTY  8/12   TOTAL KNEE ARTHROPLASTY Left 04/18/2020   TOTAL KNEE ARTHROPLASTY Left 04/18/2020   Procedure: LEFT TOTAL KNEE ARTHROPLASTY;  Surgeon: XLeandrew Koyanagi MD;  [facility]  Service: Orthopedics;  Laterality: Left;   TOTAL SHOULDER REPLACEMENT Left 08/2005   Family History  Problem Relation Age of Onset   Heart disease Mother    Heart attack Mother    Heart disease Father    Heart attack Father    Stroke Sister    Stroke Maternal Grandfather    Cancer Paternal Grandmother        unknown primary   Stomach cancer Maternal Aunt    Colon cancer Neg Hx    Social History   Socioeconomic History   Marital status: Widowed    Spouse name: Passed in Jan 2013   Number of children: 2   Years of education: Not on file   Highest education level: Master's degree (e.g., MA, MS, MEng, MEd, MSW, MBA)  Occupational History   Occupation: unemployed    Employer: RETIRED    Comment: retired sEducation officer, museum Tobacco Use   Smoking status: Never   Smokeless tobacco: Never  Vaping Use    Vaping Use: Never used  Substance and Sexual Activity   Alcohol use: Yes    Alcohol/week: 7.0 standard drinks    Types: 7 Glasses of wine per week    Comment: almost daily- wine, 1 glass per day.   Drug use: No   Sexual activity: Not Currently    Partners: Male    Birth control/protection: Surgical    Comment: TAH  Other Topics Concern   Not on file  Social History Narrative   Lives alone   2 children, 9 grandchildren, 5 great grand   One in [place] and another [place]      Enjoy: reading;       Diet: eats all food groups    Caffeine: 2 cups of coffee, some tea   Water: 6-8 cups day        Wears seat belt    Does not use phone while driving   Oceanographer at home   No weapons    Social Determinants of Health   Financial Resource Strain: Low Risk    Difficulty of Paying Living Expenses: Not hard at all  Food Insecurity: No Food Insecurity   Worried About Charity fundraiser in the Last Year: Never true   Arboriculturist in the Last Year: Never true  Transportation Needs: Unmet Transportation Needs   Lack of Transportation (Medical): Yes   Lack of Transportation (Non-Medical): Yes  Physical Activity: Insufficiently Active   Days of Exercise per Week: 5 days   Minutes of Exercise per Session: 10 min  Stress: No Stress Concern Present   Feeling of Stress : Only a little  Social Connections: Moderately Isolated   Frequency of Communication with Friends and Family: More than three times a week   Frequency of Social Gatherings with Friends and Family: Three times a week   Attends Religious Services: 1 to 4 times per year   Active Member of Clubs or Organizations: No   Attends Archivist Meetings: Never   Marital Status: Separated    Tobacco Counseling Counseling given: Not Answered   Clinical Intake:  Pre-visit preparation completed: Yes  Pain : No/denies pain Pain Score: 0-No pain     BMI - recorded: 25.1 Nutritional Status: BMI 25 -29  Overweight Nutritional Risks: None Diabetes: No  How often do you need to have someone help you when you read instructions, pamphlets, or other written materials from your doctor or pharmacy?: 1 - Never What is the last grade level you completed in school?: 12+  Diabetic?No  Interpreter Needed?: No      Activities of Daily Living In your present state of health, do you have any difficulty performing the following activities: 10/03/2021  Hearing? Y  Vision? Y  Difficulty concentrating or making decisions? N  Walking or climbing stairs? Y  Dressing or bathing? N  Doing errands, shopping? N  Preparing Food and eating ? N  Using the Toilet? N  In the past six months, have you accidently leaked urine? Y  Do you have problems with loss of bowel control? N  Managing your Medications? N  Managing your Finances? N  Housekeeping or managing your Housekeeping? N  Some recent data might be hidden    Patient Care Team: Noreene Larsson, NP as PCP - General (Nurse Practitioner) Fay Records, MD as PCP - Cardiology (Cardiology) Gala Romney Cristopher Estimable, MD as Consulting Physician (Gastroenterology)  Indicate any recent Medical Services you may have received from other than [facility] providers in  the past year (date may be approximate).     Assessment:   This is a routine wellness examination for [place].  Hearing/Vision screen No results found.  Dietary issues and exercise activities discussed: Current Exercise Habits: The patient does not participate in regular exercise at present, Exercise limited by: orthopedic condition(s)   Goals Addressed             This Visit's Progress    Patient Stated       Stay healthy and keep going.      Depression Screen PHQ 2/9 Scores 10/03/2021 10/03/2021 06/29/2021 01/02/2021 09/15/2020 09/15/2020 08/31/2020  PHQ - 2 Score 0 0 1 0 0 0 1    Fall Risk Fall Risk  10/03/2021 06/29/2021 01/02/2021 09/15/2020 08/31/2020  Falls in the past year? 0 0 0 0 0   Number falls in past yr: 0 0 0 0 0  Injury with Fall? 0 0 0 0 0  Risk for fall due to : No Fall Risks No Fall Risks No Fall Risks No Fall Risks No Fall Risks  Follow up Falls evaluation completed Falls evaluation completed Falls evaluation completed Falls evaluation completed Falls evaluation completed    [place]:  Any stairs in or around the home? Yes  If so, are there any without handrails? Yes  Home free of loose throw rugs in walkways, pet beds, electrical cords, etc? Yes  Adequate lighting in your home to reduce risk of falls? Yes   ASSISTIVE DEVICES UTILIZED TO PREVENT FALLS:  Life alert? No  Use of a cane, walker or w/c? Yes  Grab bars in the bathroom? Yes  Shower chair or bench in shower? No  Elevated toilet seat or a handicapped toilet? Yes      6CIT Screen 10/03/2021 09/15/2020  What Year? 0 points 0 points  What month? 0 points 0 points  What time? 0 points 0 points  Count back from 20 0 points 0 points  Months in reverse 0 points 0 points  Repeat phrase 0 points 0 points  Total Score 0 0    Immunizations Immunization History  Administered Date(s) Administered   Fluad Quad(high Dose 65+) 07/21/2020, 08/28/2021   Influenza-Unspecified 07/22/2018, 07/07/2019   Moderna Sars-Covid-2 Vaccination 12/24/2019, 01/22/2020, 09/08/2020, 02/13/2021   Pneumococcal Conjugate-13 07/13/2014   Pneumococcal Polysaccharide-23 07/31/2017   Tdap 11/12/2012, 01/14/2020   Zoster Recombinat (Shingrix) 05/27/2018, 08/20/2018    TDAP status: Up to date  Flu Vaccine status: Due, Education has been provided regarding the importance of this vaccine. Advised may receive this vaccine at local pharmacy or Health Dept. Aware to provide a copy of the vaccination record if obtained from local pharmacy or Health Dept. Verbalized acceptance and understanding.  Pneumococcal vaccine status: Up to date  Covid-19 vaccine status: Completed  vaccines  Qualifies for Shingles Vaccine? Yes   Zostavax completed Yes   Shingrix Completed?: Yes  Screening Tests Health Maintenance  Topic Date Due   COVID-19 Vaccine (5 - Booster for Moderna series) 04/10/2021   TETANUS/TDAP  01/13/2030   Pneumonia Vaccine 50+ Years old  Completed   INFLUENZA VACCINE  Completed   DEXA SCAN  Completed   Hepatitis C Screening  Completed   Zoster Vaccines- Shingrix  Completed   HPV VACCINES  Aged Out   COLONOSCOPY (Pts 45-66yr Insurance coverage will need to be confirmed)  Discontinued    Health Maintenance  Health Maintenance Due  Topic Date Due   COVID-19 Vaccine (5 -  Booster for Moderna series) 04/10/2021    Colorectal cancer screening: No longer required.   Mammogram status: Completed 08/31/2019. Repeat every year  Bone density completed 03/02/2015.Repeat every 2 years.Ordered today 10/03/21.  Lung Cancer Screening: (Low Dose CT Chest recommended if Age 20-80 years, 30 pack-year currently smoking OR have quit w/in 15years.) does not qualify.   Lung Cancer Screening Referral: n/a  Additional Screening:  Hepatitis C Screening: does qualify; Completed 04/17/2019  Vision Screening: Recommended annual ophthalmology exams for early detection of glaucoma and other disorders of the eye. Is the patient up to date with their annual eye exam?  Yes  Who is the provider or what is the name of the office in which the patient attends annual eye exams? My eye doctor Linna Hoff  If pt is not established with a provider, would they like to be referred to a provider to establish care? No .   Dental Screening: Recommended annual dental exams for proper oral hygiene  Community Resource Referral / Chronic Care Management: CRR required this visit?  No   CCM required this visit?  No      Plan:     I have personally reviewed and noted the following in the patient's chart:   Medical and social history Use of alcohol, tobacco or illicit drugs   Current medications and supplements including opioid prescriptions.  Functional ability and status Nutritional status Physical activity Advanced directives List of other physicians Hospitalizations, surgeries, and ER visits in previous 12 months Vitals Screenings to include cognitive, depression, and falls Referrals and appointments  In addition, I have reviewed and discussed with patient certain preventive protocols, quality metrics, and best practice recommendations. A written personalized care plan for preventive services as well as general preventive health recommendations were provided to patient.   Ms. Dehaan , Thank you for taking time to come for your Medicare Wellness Visit. I appreciate your ongoing commitment to your health goals. Please review the following plan we discussed and let me know if I can assist you in the future.   These are the goals we discussed:  Goals      Patient Stated     Stay healthy and keep going.     Social and Functional Skills Optimized     Evidence-based guidance:  Assess level of social support; promote maintaining links with family, friends and community to reduce social isolation.  Assess level of function related to basic activities of daily living that include eating, dressing, bathing, as well as instrumental activities of daily living such as shopping, managing finances and use of devices.  Encourage continuation of daily life components such as self-care, home maintenance, financial management, volunteer activities, education opportunities and hobbies.  Refer to occupational or physical therapy to develop comprehensive rehabilitation plan to improve or maintain activities of daily living; consider inclusion of endurance, balance and resistance-training.  Consider complementary therapy such as yoga, music, gardening, outdoor activities, aromatherapy and tai chi.   Notes: "Getting through the holiday's"         This is a list of the  screening recommended for you and due dates:  Health Maintenance  Topic Date Due   COVID-19 Vaccine (5 - Booster for Moderna series) 04/10/2021   Tetanus Vaccine  01/13/2030   Pneumonia Vaccine  Completed   Flu Shot  Completed   DEXA scan (bone density measurement)  Completed   Hepatitis C Screening: USPSTF Recommendation to screen - Ages 75-79 yo.  Completed   Zoster (Shingles) Vaccine  Completed   HPV Vaccine  Aged Out   Colon Cancer Screening  Discontinued        Quentin Angst, [place]   10/03/2021   Nurse Notes:

## 2021-10-09 NOTE — Patient Instructions (Signed)
[name] [name]  10/09/2021     @PREFPERIOPPHARMACY @   Your procedure is scheduled on  10/12/2021.   Report to [facility] at  0830 AM   Call this number if you have problems the morning of surgery:  [phone]   Remember:  Follow the diet and prep instructions given to you by the office.    Take these medicines the morning of surgery with A SIP OF WATER            Xanax(if needed), tagamet, cymbalta, gabapentin, levothyroxine, mobic (if needed), wellbutrin.     Do not wear jewelry, make-up or nail polish.  Do not wear lotions, powders, or perfumes, or deodorant.  Do not shave 48 hours prior to surgery.  Men may shave face and neck.  Do not bring valuables to the hospital.  [facility] is not responsible for any belongings or valuables.  Contacts, dentures or bridgework may not be worn into surgery.  Leave your suitcase in the car.  After surgery it may be brought to your room.  For patients admitted to the hospital, discharge time will be determined by your treatment team.  Patients discharged the day of surgery will not be allowed to drive home and must have someone with them for 24 hours.    Special instructions:   DO NOT smoke tobacco or vape for 24 hours before your procedure.  Please read over the following fact sheets that you were given. Anesthesia Post-op Instructions and Care and Recovery After Surgery      Upper Endoscopy, Adult, Care After This sheet gives you information about how to care for yourself after your procedure. Your health care provider may also give you more specific instructions. If you have problems or questions, contact your health care provider. What can I expect after the procedure? After the procedure, it is common to have: A sore throat. Mild stomach pain or discomfort. Bloating. Nausea. Follow these instructions at home:  Follow instructions from your health care provider about what to eat or drink after your  procedure. Return to your normal activities as told by your health care provider. Ask your health care provider what activities are safe for you. Take over-the-counter and prescription medicines only as told by your health care provider. If you were given a sedative during the procedure, it can affect you for several hours. Do not drive or operate machinery until your health care provider says that it is safe. Keep all follow-up visits as told by your health care provider. This is important. Contact a health care provider if you have: A sore throat that lasts longer than one day. Trouble swallowing. Get help right away if: You vomit blood or your vomit looks like coffee grounds. You have: A fever. Bloody, black, or tarry stools. A severe sore throat or you cannot swallow. Difficulty breathing. Severe pain in your chest or abdomen. Summary After the procedure, it is common to have a sore throat, mild stomach discomfort, bloating, and nausea. If you were given a sedative during the procedure, it can affect you for several hours. Do not drive or operate machinery until your health care provider says that it is safe. Follow instructions from your health care provider about what to eat or drink after your procedure. Return to your normal activities as told by your health care provider. This information is not intended to replace advice given to you by your health care provider.  Make sure you discuss any questions you have with your health care provider. Document Revised: 09/04/2019 Document Reviewed: 03/31/2018 Elsevier Patient Education  2022 [place]. Esophageal Dilatation Esophageal dilatation, also called esophageal dilation, is a procedure to widen or open a blocked or narrowed part of the esophagus. The esophagus is the part of the body that moves food and liquid from the mouth to the stomach. You may need this procedure if: You have a buildup of scar tissue in your esophagus that  makes it difficult, painful, or impossible to swallow. This can be caused by gastroesophageal reflux disease (GERD). You have cancer of the esophagus. There is a problem with how food moves through your esophagus. In some cases, you may need this procedure repeated at a later time to dilate the esophagus gradually. Tell a health care provider about: Any allergies you have. All medicines you are taking, including vitamins, herbs, eye drops, creams, and over-the-counter medicines. Any problems you or family members have had with anesthetic medicines. Any blood disorders you have. Any surgeries you have had. Any medical conditions you have. Any antibiotic medicines you are required to take before dental procedures. Whether you are pregnant or may be pregnant. What are the risks? Generally, this is a safe procedure. However, problems may occur, including: Bleeding due to a tear in the lining of the esophagus. A hole, or perforation, in the esophagus. What happens before the procedure? Ask your health care provider about: Changing or stopping your regular medicines. This is especially important if you are taking diabetes medicines or blood thinners. Taking medicines such as aspirin and ibuprofen. These medicines can thin your blood. Do not take these medicines unless your health care provider tells you to take them. Taking over-the-counter medicines, vitamins, herbs, and supplements. Follow instructions from your health care provider about eating or drinking restrictions. Plan to have a responsible adult take you home from the hospital or clinic. Plan to have a responsible adult care for you for the time you are told after you leave the hospital or clinic. This is important. What happens during the procedure? You may be given a medicine to help you relax (sedative). A numbing medicine may be sprayed into the back of your throat, or you may gargle the medicine. Your health care provider may  perform the dilatation using various surgical instruments, such as: Simple dilators. This instrument is carefully placed in the esophagus to stretch it. Guided wire bougies. This involves using an endoscope to insert a wire into the esophagus. A dilator is passed over this wire to enlarge the esophagus. Then the wire is removed. Balloon dilators. An endoscope with a small balloon is inserted into the esophagus. The balloon is inflated to stretch the esophagus and open it up. The procedure may vary among health care providers and hospitals. What can I expect after the procedure? Your blood pressure, heart rate, breathing rate, and blood oxygen level will be monitored until you leave the hospital or clinic. Your throat may feel slightly sore and numb. This will get better over time. You will not be allowed to eat or drink until your throat is no longer numb. When you are able to drink, urinate, and sit on the edge of the bed without nausea or dizziness, you may be able to return home. Follow these instructions at home: Take over-the-counter and prescription medicines only as told by your health care provider. If you were given a sedative during the procedure, it can affect you for  several hours. Do not drive or operate machinery until your health care provider says that it is safe. Plan to have a responsible adult care for you for the time you are told. This is important. Follow instructions from your health care provider about any eating or drinking restrictions. Do not use any products that contain nicotine or tobacco, such as cigarettes, e-cigarettes, and chewing tobacco. If you need help quitting, ask your health care provider. Keep all follow-up visits. This is important. Contact a health care provider if: You have a fever. You have pain that is not relieved by medicine. Get help right away if: You have chest pain. You have trouble breathing. You have trouble swallowing. You vomit  blood. You have black, tarry, or bloody stools. These symptoms may represent a serious problem that is an emergency. Do not wait to see if the symptoms will go away. Get medical help right away. Call your local emergency services (911 in the [place] Do not drive yourself to the hospital. Summary Esophageal dilatation, also called esophageal dilation, is a procedure to widen or open a blocked or narrowed part of the esophagus. Plan to have a responsible adult take you home from the hospital or clinic. For this procedure, a numbing medicine may be sprayed into the back of your throat, or you may gargle the medicine. Do not drive or operate machinery until your health care provider says that it is safe. This information is not intended to replace advice given to you by your health care provider. Make sure you discuss any questions you have with your health care provider. Document Revised: 03/16/2020 Document Reviewed: 03/16/2020 Elsevier Patient Education  [place]. Colonoscopy, Adult, Care After This sheet gives you information about how to care for yourself after your procedure. Your health care provider may also give you more specific instructions. If you have problems or questions, contact your health care provider. What can I expect after the procedure? After the procedure, it is common to have: A small amount of blood in your stool for 24 hours after the procedure. Some gas. Mild cramping or bloating of your abdomen. Follow these instructions at home: Eating and drinking  Drink enough fluid to keep your urine pale yellow. Follow instructions from your health care provider about eating or drinking restrictions. Resume your normal diet as instructed by your health care provider. Avoid heavy or fried foods that are hard to digest. Activity Rest as told by your health care provider. Avoid sitting for a long time without moving. Get up to take short walks every 1-2 hours. This is  important to improve blood flow and breathing. Ask for help if you feel weak or unsteady. Return to your normal activities as told by your health care provider. Ask your health care provider what activities are safe for you. Managing cramping and bloating  Try walking around when you have cramps or feel bloated. Apply heat to your abdomen as told by your health care provider. Use the heat source that your health care provider recommends, such as a moist heat pack or a heating pad. Place a towel between your skin and the heat source. Leave the heat on for 20-30 minutes. Remove the heat if your skin turns bright red. This is especially important if you are unable to feel pain, heat, or cold. You may have a greater risk of getting burned. General instructions If you were given a sedative during the procedure, it can affect you for several hours. Do  not drive or operate machinery until your health care provider says that it is safe. For the first 24 hours after the procedure: Do not sign important documents. Do not drink alcohol. Do your regular daily activities at a slower pace than normal. Eat soft foods that are easy to digest. Take over-the-counter and prescription medicines only as told by your health care provider. Keep all follow-up visits as told by your health care provider. This is important. Contact a health care provider if: You have blood in your stool 2-3 days after the procedure. Get help right away if you have: More than a small spotting of blood in your stool. Large blood clots in your stool. Swelling of your abdomen. Nausea or vomiting. A fever. Increasing pain in your abdomen that is not relieved with medicine. Summary After the procedure, it is common to have a small amount of blood in your stool. You may also have mild cramping and bloating of your abdomen. If you were given a sedative during the procedure, it can affect you for several hours. Do not drive or operate  machinery until your health care provider says that it is safe. Get help right away if you have a lot of blood in your stool, nausea or vomiting, a fever, or increased pain in your abdomen. This information is not intended to replace advice given to you by your health care provider. Make sure you discuss any questions you have with your health care provider. Document Revised: 09/04/2019 Document Reviewed: 05/25/2019 Elsevier Patient Education  [place] After This sheet gives you information about how to care for yourself after your procedure. Your health care provider may also give you more specific instructions. If you have problems or questions, contact your health care provider. What can I expect after the procedure? After the procedure, it is common to have: Tiredness. Forgetfulness about what happened after the procedure. Impaired judgment for important decisions. Nausea or vomiting. Some difficulty with balance. Follow these instructions at home: For the time period you were told by your health care provider:   Rest as needed. Do not participate in activities where you could fall or become injured. Do not drive or use machinery. Do not drink alcohol. Do not take sleeping pills or medicines that cause drowsiness. Do not make important decisions or sign legal documents. Do not take care of children on your own. Eating and drinking Follow the diet that is recommended by your health care provider. Drink enough fluid to keep your urine pale yellow. If you vomit: Drink water, juice, or soup when you can drink without vomiting. Make sure you have little or no nausea before eating solid foods. General instructions Have a responsible adult stay with you for the time you are told. It is important to have someone help care for you until you are awake and alert. Take over-the-counter and prescription medicines only as told by your health care  provider. If you have sleep apnea, surgery and certain medicines can increase your risk for breathing problems. Follow instructions from your health care provider about wearing your sleep device: Anytime you are sleeping, including during daytime naps. While taking prescription pain medicines, sleeping medicines, or medicines that make you drowsy. Avoid smoking. Keep all follow-up visits as told by your health care provider. This is important. Contact a health care provider if: You keep feeling nauseous or you keep vomiting. You feel light-headed. You are still sleepy or having trouble with balance after  24 hours. You develop a rash. You have a fever. You have redness or swelling around the IV site. Get help right away if: You have trouble breathing. You have new-onset confusion at home. Summary For several hours after your procedure, you may feel tired. You may also be forgetful and have poor judgment. Have a responsible adult stay with you for the time you are told. It is important to have someone help care for you until you are awake and alert. Rest as told. Do not drive or operate machinery. Do not drink alcohol or take sleeping pills. Get help right away if you have trouble breathing, or if you suddenly become confused. This information is not intended to replace advice given to you by your health care provider. Make sure you discuss any questions you have with your health care provider. Document Revised: 07/14/2020 Document Reviewed: 10/01/2019 Elsevier Patient Education  2022 [employer].

## 2021-10-10 ENCOUNTER — Encounter (HOSPITAL_COMMUNITY)
Admission: RE | Admit: 2021-10-10 | Discharge: 2021-10-10 | Disposition: A | Discharge status: Home / Self Care | Payer: Medicare PPO | Source: Ambulatory Visit | Attending: Internal Medicine | Admitting: Internal Medicine

## 2021-10-10 ENCOUNTER — Encounter (HOSPITAL_COMMUNITY): Payer: Self-pay

## 2021-10-10 ENCOUNTER — Telehealth: Payer: Self-pay | Admitting: Internal Medicine

## 2021-10-10 ENCOUNTER — Other Ambulatory Visit: Payer: Self-pay

## 2021-10-10 NOTE — Telephone Encounter (Signed)
Patient pharmacy saying they have not received her prep prescription

## 2021-10-10 NOTE — Telephone Encounter (Signed)
Called pharmacy and advised them Rx sent 11/3. Once pharmacists looked further they do have Rx. Will get it filled for pt.  Called pt and made aware. She voiced understanding

## 2021-10-11 ENCOUNTER — Telehealth: Payer: Self-pay | Admitting: Internal Medicine

## 2021-10-11 ENCOUNTER — Encounter (HOSPITAL_COMMUNITY): Payer: Self-pay | Admitting: Emergency Medicine

## 2021-10-11 ENCOUNTER — Emergency Department (HOSPITAL_COMMUNITY): Payer: Medicare PPO

## 2021-10-11 ENCOUNTER — Emergency Department (HOSPITAL_COMMUNITY)
Admission: EM | Admit: 2021-10-11 | Discharge: 2021-10-11 | Disposition: A | Discharge status: Home / Self Care | Payer: Medicare PPO | Attending: Emergency Medicine | Admitting: Emergency Medicine

## 2021-10-11 ENCOUNTER — Other Ambulatory Visit: Payer: Self-pay

## 2021-10-11 DIAGNOSIS — I2510 Atherosclerotic heart disease of native coronary artery without angina pectoris: Secondary | ICD-10-CM | POA: Diagnosis not present

## 2021-10-11 DIAGNOSIS — X58XXXA Exposure to other specified factors, initial encounter: Secondary | ICD-10-CM | POA: Diagnosis not present

## 2021-10-11 DIAGNOSIS — T84020A Dislocation of internal right hip prosthesis, initial encounter: Secondary | ICD-10-CM | POA: Diagnosis not present

## 2021-10-11 DIAGNOSIS — S73004A Unspecified dislocation of right hip, initial encounter: Secondary | ICD-10-CM | POA: Diagnosis not present

## 2021-10-11 DIAGNOSIS — E039 Hypothyroidism, unspecified: Secondary | ICD-10-CM | POA: Insufficient documentation

## 2021-10-11 DIAGNOSIS — Z79899 Other long term (current) drug therapy: Secondary | ICD-10-CM | POA: Insufficient documentation

## 2021-10-11 DIAGNOSIS — Z96612 Presence of left artificial shoulder joint: Secondary | ICD-10-CM | POA: Insufficient documentation

## 2021-10-11 DIAGNOSIS — Z96652 Presence of left artificial knee joint: Secondary | ICD-10-CM | POA: Diagnosis not present

## 2021-10-11 DIAGNOSIS — I10 Essential (primary) hypertension: Secondary | ICD-10-CM | POA: Insufficient documentation

## 2021-10-11 DIAGNOSIS — R52 Pain, unspecified: Secondary | ICD-10-CM

## 2021-10-11 DIAGNOSIS — R0689 Other abnormalities of breathing: Secondary | ICD-10-CM | POA: Diagnosis not present

## 2021-10-11 DIAGNOSIS — Z7982 Long term (current) use of aspirin: Secondary | ICD-10-CM | POA: Diagnosis not present

## 2021-10-11 DIAGNOSIS — Z96643 Presence of artificial hip joint, bilateral: Secondary | ICD-10-CM | POA: Diagnosis not present

## 2021-10-11 DIAGNOSIS — T07XXXA Unspecified multiple injuries, initial encounter: Secondary | ICD-10-CM | POA: Diagnosis not present

## 2021-10-11 MED ORDER — PROPOFOL 10 MG/ML IV BOLUS
INTRAVENOUS | Status: AC | PRN
  Administered 2021-10-11: 50 mg via INTRAVENOUS
  Administered 2021-10-11: 25 mg via INTRAVENOUS
  Administered 2021-10-11: 50 mg via INTRAVENOUS
  Administered 2021-10-11: 25 mg via INTRAVENOUS

## 2021-10-11 MED ORDER — PROPOFOL 10 MG/ML IV BOLUS
0.5000 mg/kg | Freq: Once | INTRAVENOUS | Status: DC
  Filled 2021-10-11: qty 20

## 2021-10-11 MED ORDER — OXYCODONE-ACETAMINOPHEN 5-325 MG PO TABS: 1.0000 | ORAL_TABLET | ORAL | 0 refills | Status: DC | PRN

## 2021-10-11 MED ORDER — FENTANYL CITRATE PF 50 MCG/ML IJ SOSY
50.0000 ug | PREFILLED_SYRINGE | Freq: Once | INTRAMUSCULAR | Status: AC
  Administered 2021-10-11: 50 ug via INTRAVENOUS
  Filled 2021-10-11: qty 1

## 2021-10-11 MED ORDER — KETOROLAC TROMETHAMINE 30 MG/ML IJ SOLN
30.0000 mg | Freq: Once | INTRAMUSCULAR | Status: AC
  Administered 2021-10-11: 30 mg via INTRAVENOUS
  Filled 2021-10-11: qty 1

## 2021-10-11 MED ORDER — OXYCODONE-ACETAMINOPHEN 5-325 MG PO TABS
1.0000 | ORAL_TABLET | Freq: Once | ORAL | Status: AC
  Administered 2021-10-11: 1 via ORAL
  Filled 2021-10-11: qty 1

## 2021-10-11 NOTE — Telephone Encounter (Signed)
Called and informed pt to call office when she is able to reschedule procedure.

## 2021-10-11 NOTE — ED Provider Notes (Signed)
[place] Provider Note   CSN: [id number] Arrival date & time: 10/11/21  1353     History No chief complaint on file.   [name] [name] is a 80 y.o. female.  The history is provided by the patient. No language interpreter was used.  Hip Pain This is a new problem. The current episode started less than 1 hour ago. The problem occurs constantly. The problem has been gradually worsening. Nothing aggravates the symptoms. Nothing relieves the symptoms. She has tried nothing for the symptoms. The treatment provided no relief.      Past Medical History:  Diagnosis Date   Anemia    Anxiety    Arthritis    BIPOLAR AFFECTIVE DISORDER 05/11/2009   Qualifier: Diagnosis of  By: Orville Govern, CMA, Carol     Bipolar affective disorder [facility])    Bipolar disorder ([place])    Cataract    Chest pain 05/11/2009   Qualifier: Diagnosis of  By: Orville Govern, CMA, Carol     Chronic back pain    Chronic cough 11/23/2014   Chronic fatigue    Chronic pain syndrome 01/31/253   Complication of anesthesia    Constipation    Depression    Difficulty in walking(719.7) 07/26/2011   Dizziness    Dyslipidemia    Fecal incontinence 11/23/2014   Fibromyalgia    GERD (gastroesophageal reflux disease)    H/O anxiety disorder 03/05/2013   Overview:  Heart Cath x 2 per patient; most recent 4 yrs ago with "negative" findings per patient; Cardiologist, Dr Dorris Carnes following; last seen 10/2011; most recent episode 11/2012 with admit [facility]) and negative work-up per patient    H/O: hysterectomy    History of back surgery 03/05/2013   History of gastroesophageal reflux (GERD) 03/05/2013   History of kidney stones    Hx of adenomatous colonic polyps 2005   Hyperlipidemia    Hypertension    Hypothyroidism    Knee pain    Midsternal chest pain 08/19/2015   Muscle weakness (generalized) 04/27/2013   Non-obstructive CAD    a. 08/2015 Cath: LM 40, LCX small, 30ost, 84m, OM2 small, RCA nl/small, EF  55-65%.   Osteopenia    PONV (postoperative nausea and vomiting)    Primary osteoarthritis of left knee 04/17/2020   Scalp laceration 03/01/2020   Sciatica     Patient Active Problem List   Diagnosis Date Noted   Pain in right ankle and joints of right foot 04/18/2021   Dysphagia 11/02/2020   Encounter for examination following treatment at hospital 07/21/2020   Need for immunization against influenza 07/21/2020   Degenerative scoliosis in adult patient 07/21/2020   Abdominal pain, epigastric 08/14/2019   Bipolar disorder ([place])    Hypothyroidism 08/04/2015    Class: Diagnosis of   Constipation 09/22/2013   H/O adenomatous polyp of colon 09/22/2013   Vitamin D deficiency 03/05/2013   Insomnia 03/05/2013   H/O chest pain 03/05/2013   H/O bilateral hip replacements 03/05/2013   HTN (hypertension) 10/15/2011   Hyperlipidemia 05/11/2009   Coronary artery disease 05/11/2009   Gastroesophageal reflux disease 05/11/2009    Past Surgical History:  Procedure Laterality Date   ABDOMINAL HYSTERECTOMY  1994   TAH- DUB, BSO   [place] bunionectomy     bilateral   BREAST BIOPSY Left 1989   BREAST LUMPECTOMY     BREAST SURGERY N/A    Phreesia 09/14/2020   BUNIONECTOMY Left 2005   CARDIAC CATHETERIZATION  2010   +  CAD   CARDIAC CATHETERIZATION N/A 08/19/2015   Procedure: Left Heart Cath and Coronary Angiography;  Surgeon: Belva Crome, MD;  Location: [place] [facility];  Service: Cardiovascular;  Laterality: N/A;   COLONOSCOPY  01/29/2007   [id number] rectum, left-sided diverticula, 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  Remainder of colonic mucosa was normal   COLONOSCOPY WITH ESOPHAGOGASTRODUODENOSCOPY (EGD) N/A 02/01/2014   TJQ:ZESP erosive reflux/gastric polyp/normal rectum/scattered left sided diverticula, normal distal TI. gastric bx negative, fundic gland polyp. next TCS 01/2019.   CYSTECTOMY     pilonidial cyst   ESOPHAGOGASTRODUODENOSCOPY     followed by  colonoscopy with snare polypectomy   EYE SURGERY Bilateral    cataract surgery with lens implants   JOINT REPLACEMENT N/A    [employer]reesia 09/14/2020   REVISION TOTAL HIP ARTHROPLASTY Right    right knee replacement  02/2013   SHOULDER ARTHROSCOPY Left 2005   SHOULDER ARTHROSCOPY Right    SPINAL FUSION     SPINAL FUSION  2004   L4-L5   SPINE SURGERY N/A    [employer]reesia 09/14/2020   TOTAL HIP ARTHROPLASTY Right 06/2006   TOTAL HIP ARTHROPLASTY  8/12   TOTAL KNEE ARTHROPLASTY Left 04/18/2020   TOTAL KNEE ARTHROPLASTY Left 04/18/2020   Procedure: LEFT TOTAL KNEE ARTHROPLASTY;  Surgeon: Leandrew Koyanagi, MD;  Location: [place];  Service: Orthopedics;  Laterality: Left;   TOTAL SHOULDER REPLACEMENT Left 08/2005     OB History     Gravida  2   Para  2   Term  2   Preterm      AB      Living  2      SAB      IAB      Ectopic      Multiple      Live Births              Family History  Problem Relation Age of Onset   Heart disease Mother    Heart attack Mother    Heart disease Father    Heart attack Father    Stroke Sister    Stroke Maternal Grandfather    Cancer Paternal Grandmother        unknown primary   Stomach cancer Maternal Aunt    Colon cancer Neg Hx     Social History   Tobacco Use   Smoking status: Never   Smokeless tobacco: Never  Vaping Use   Vaping Use: Never used  Substance Use Topics   Alcohol use: Yes    Alcohol/week: 7.0 standard drinks    Types: 7 Glasses of wine per week    Comment: almost daily- wine, 1 glass per day.   Drug use: No    Home Medications Prior to Admission medications   Medication Sig Start Date End Date Taking? Authorizing Provider  ALPRAZolam Duanne Moron) 0.5 MG tablet Take 0.25-0.5 mg by mouth daily as needed for anxiety.  12/15/13   [provider]  aspirin EC 81 MG tablet Take 81 mg by mouth daily. Swallow whole.    [provider]  atorvastatin (LIPITOR) 40 MG tablet TAKE (1) TABLET BY MOUTH AT BEDTIME.  05/04/21   Fay Records, MD  bisacodyl (DULCOLAX) 5 MG EC tablet Take 1 tablet by mouth daily as needed for mild constipation (constipation).     [provider]  Calcium Carb-Cholecalciferol (CALCIUM/VITAMIN D PO) Take 1 tablet by mouth daily.    [provider]  Cholecalciferol (  VITAMIN D) 50 MCG (2000 UT) CAPS Take 2,000 Units by mouth daily.    [provider]  Cimetidine (TAGAMET PO) Take 1 tablet by mouth daily.    [provider]  DULoxetine (CYMBALTA) 60 MG capsule Take 120 mg by mouth daily.    [provider]  estradiol (ESTRACE) 1 MG tablet TAKE 1/2 TABLET BY MOUTH DAILY. 01/02/21   Lindell Spar, MD  gabapentin (NEURONTIN) 100 MG capsule Take 100 mg by mouth 2 (two) times daily as needed (pain). 03/04/21   [provider]  lamoTRIgine (LAMICTAL) 150 MG tablet Take 150 mg by mouth at bedtime.    [provider]  levothyroxine (SYNTHROID) 25 MCG tablet Take 1 tablet (25 mcg total) by mouth daily. 06/30/21   Noreene Larsson, NP  losartan (COZAAR) 50 MG tablet TAKE (1) TABLET BY MOUTH ONCE DAILY. 05/25/21   Fay Records, MD  meloxicam (MOBIC) 7.5 MG tablet Take 1 tablet (7.5 mg total) by mouth daily as needed for up to 14 doses for pain. Do not start taking until finished with steroid taper 03/09/21   Aundra Dubin, PA-C  Multiple Vitamin (MULTIVITAMIN) tablet Take 1 tablet by mouth daily.    [provider]  niacin (SLO-NIACIN) 500 MG tablet Take 500 mg by mouth daily.    [provider]  nitroGLYCERIN (NITROSTAT) 0.4 MG SL tablet PLACE 1 TAB UNDER TONGUE EVERY 5 MIN IF NEEDED FOR CHEST PAIN. MAY USE 3 TIMES.NO RELIEF CALL 911. Patient taking differently: Place 0.4 mg under the tongue every 5 (five) minutes as needed for chest pain. PLACE 1 TAB UNDER TONGUE EVERY 5 MIN IF NEEDED FOR CHEST PAIN. MAY USE 3 TIMES.NO RELIEF CALL 911. 06/19/17   Lyda Jester M, PA-C  Omega-3 Fatty Acids (FISH OIL) 1000 MG CAPS  Take 1,000 mg by mouth daily.    [provider]  polyethylene glycol-electrolytes (NULYTELY) 420 g solution As directed 09/14/21   Rourk, Cristopher Estimable, MD  Probiotic Product (PROBIOTIC DAILY PO) Take 1 tablet by mouth daily.     [provider]  risperiDONE (RISPERDAL) 1 MG tablet Take 0.5 mg by mouth at bedtime.    [provider]  WELLBUTRIN XL 300 MG 24 hr tablet Take 300 mg by mouth daily.  07/08/14   [provider]    Allergies    Hydrocodone-acetaminophen  Review of Systems   Review of Systems  All other systems reviewed and are negative.  Physical Exam Updated Vital Signs BP (!) 117/106   Pulse (!) 109   Temp 97.9 F (36.6 C) (Oral)   Resp 19   Ht 4\' 9"  (1.448 m)   Wt 51.7 kg   LMP 02/10/1993   SpO2 100%   BMI 24.67 kg/m   Physical Exam Vitals reviewed.  Constitutional:      Appearance: Normal appearance.  HENT:     Mouth/Throat:     Mouth: Mucous membranes are moist.  Eyes:     Pupils: Pupils are equal, round, and reactive to light.  Cardiovascular:     Rate and Rhythm: Normal rate.  Pulmonary:     Effort: Pulmonary effort is normal.  Musculoskeletal:        General: Tenderness present.     Comments: Shortened and rotated right hip/ foot.  Good pulse   Skin:    General: Skin is warm.  Neurological:     General: No focal deficit present.     Mental Status: She  is alert.  Psychiatric:        Mood and Affect: Mood normal.    ED Results / Procedures / Treatments   Labs (all labs ordered are listed, but only abnormal results are displayed) Labs Reviewed - No data to display  EKG None  Radiology DG Pelvis Portable  Result Date: 10/11/2021 CLINICAL DATA:  Right hip dislocation. EXAM: PORTABLE PELVIS 1-2 VIEWS COMPARISON:  None. FINDINGS: Generalized osteopenia. No acute fracture or dislocation. Bilateral hip arthroplasties. Successful interval reduction right hip dislocation. IMPRESSION: Successful interval reduction of  the right hip dislocation. Electronically Signed   By: Kathreen Devoid M.D.   On: 10/11/2021 15:30   DG Hip Unilat With Pelvis 2-3 Views Right  Result Date: 10/11/2021 CLINICAL DATA:  Hip dislocation EXAM: DG HIP (WITH OR WITHOUT PELVIS) 2-3V RIGHT COMPARISON:  None. FINDINGS: Superior dislocation of right hip arthroplasty. No associated perihardware fracture. Status post left hip total arthroplasty, in expected apposition. IMPRESSION: 1. Superior dislocation of right hip total arthroplasty. No associated perihardware fracture. 2. Left hip total arthroplasty, in expected apposition. Electronically Signed   By: Delanna Ahmadi M.D.   On: 10/11/2021 14:27    Procedures Procedures   Medications Ordered in ED Medications  propofol (DIPRIVAN) 10 mg/mL bolus/IV push 25.9 mg (25.9 mg Intravenous Not Given 10/11/21 1534)  fentaNYL (SUBLIMAZE) injection 50 mcg (50 mcg Intravenous Given 10/11/21 1411)  propofol (DIPRIVAN) 10 mg/mL bolus/IV push (50 mg Intravenous Given 10/11/21 1513)  ketorolac (TORADOL) 30 MG/ML injection 30 mg (30 mg Intravenous Given 10/11/21 1723)    ED Course  I have reviewed the triage vital signs and the nursing notes.  Pertinent labs & imaging results that were available during my care of the patient were reviewed by me and considered in my medical decision making (see chart for details).    MDM Rules/Calculators/A&P                           MDM:  Pt given fentanyl Iv.  Concious sedation and reduction by Dr. Vanita Panda Final Clinical Impression(s) / ED Diagnoses Final diagnoses:  Pain    Rx / DC Orders ED Discharge Orders     None        Sidney Ace 10/11/21 1832    Carmin Muskrat, MD 10/14/21 1806

## 2021-10-11 NOTE — Telephone Encounter (Signed)
PATIENT CURRENTLY AT HOSPITAL WITH A HIP DISLOCATION.  NEEDS TO CANCEL PROCEDURE

## 2021-10-11 NOTE — Telephone Encounter (Signed)
Noted. Please let her know to call back when she is ready to reschedule.

## 2021-10-11 NOTE — ED Triage Notes (Signed)
Pt arrived via [facility]. C/o of R hip pian. started today when bending over to pick something up off the floor

## 2021-10-11 NOTE — Telephone Encounter (Signed)
Sent message to endo scheduler to cancel TCS/EGD/-/+DIL scheduled for tomorrow.

## 2021-10-12 ENCOUNTER — Encounter (HOSPITAL_COMMUNITY): Admission: RE | Payer: Self-pay | Source: Home / Self Care

## 2021-10-12 ENCOUNTER — Ambulatory Visit (HOSPITAL_COMMUNITY): Admission: RE | Admit: 2021-10-12 | Payer: Medicare PPO | Source: Home / Self Care | Admitting: Internal Medicine

## 2021-10-12 SURGERY — COLONOSCOPY WITH PROPOFOL
Anesthesia: Monitor Anesthesia Care

## 2021-10-13 ENCOUNTER — Other Ambulatory Visit: Payer: Self-pay

## 2021-10-13 ENCOUNTER — Ambulatory Visit (HOSPITAL_BASED_OUTPATIENT_CLINIC_OR_DEPARTMENT_OTHER): Payer: Medicare PPO | Admitting: Orthopaedic Surgery

## 2021-10-13 DIAGNOSIS — M25351 Other instability, right hip: Secondary | ICD-10-CM | POA: Diagnosis not present

## 2021-10-13 NOTE — Progress Notes (Signed)
Chief Complaint: right hip pain     History of Present Illness:    [name] [name] is a 80 y.o. female who presents after right hip dislocation sustained on October 11, 2021.  This was reduced in the emergency room.  She states that she had the hip arthroplasty done at [facility] over 10 years prior.  She is not had any issues with the side dislocating prior to now.  She states that she was sitting and went to lean forward at which point she felt it dislocate.  Of note she does have a history of left-sided arthroplasty done 10 years prior which also dislocated immediately 6 months postop and subsequently did not have any issues.  She is previous seen Dr. Erlinda Hong for knee arthroplasty which is doing well.  She is taking oxycodone particularly at night for pain.  She is here with her daughter today who is helping her with mobilization    Surgical History:   None  PMH/PSH/Family History/Social History/Meds/Allergies:    Past Medical History:  Diagnosis Date   Anemia    Anxiety    Arthritis    BIPOLAR AFFECTIVE DISORDER 05/11/2009   Qualifier: Diagnosis of  By: Orville Govern, CMA, Carol     Bipolar affective disorder [facility])    Bipolar disorder ([place])    Cataract    Chest pain 05/11/2009   Qualifier: Diagnosis of  By: Orville Govern, CMA, Carol     Chronic back pain    Chronic cough 11/23/2014   Chronic fatigue    Chronic pain syndrome 7/42/5956   Complication of anesthesia    Constipation    Depression    Difficulty in walking(719.7) 07/26/2011   Dizziness    Dyslipidemia    Fecal incontinence 11/23/2014   Fibromyalgia    GERD (gastroesophageal reflux disease)    H/O anxiety disorder 03/05/2013   Overview:  Heart Cath x 2 per patient; most recent 4 yrs ago with "negative" findings per patient; Cardiologist, Dr Dorris Carnes following; last seen 10/2011; most recent episode 11/2012 with admit [facility]) and negative work-up per patient    H/O: hysterectomy     History of back surgery 03/05/2013   History of gastroesophageal reflux (GERD) 03/05/2013   History of kidney stones    Hx of adenomatous colonic polyps 2005   Hyperlipidemia    Hypertension    Hypothyroidism    Knee pain    Midsternal chest pain 08/19/2015   Muscle weakness (generalized) 04/27/2013   Non-obstructive CAD    a. 08/2015 Cath: LM 40, LCX small, 30ost, 78m, OM2 small, RCA nl/small, EF 55-65%.   Osteopenia    PONV (postoperative nausea and vomiting)    Primary osteoarthritis of left knee 04/17/2020   Scalp laceration 03/01/2020   Sciatica    Past Surgical History:  Procedure Laterality Date   ABDOMINAL HYSTERECTOMY  1994   TAH- DUB, BSO   [place] bunionectomy     bilateral   BREAST BIOPSY Left 1989   BREAST LUMPECTOMY     BREAST SURGERY N/A    [employer]reesia 09/14/2020   BUNIONECTOMY Left 2005   CARDIAC CATHETERIZATION  2010   +CAD   CARDIAC CATHETERIZATION N/A 08/19/2015   Procedure: Left Heart Cath and Coronary Angiography;  Surgeon: Belva Crome, MD;  Location: [place] [facility];  Service: Cardiovascular;  Laterality: N/A;   COLONOSCOPY  01/29/2007   XQJ:JHERDE rectum, left-sided diverticula, 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  Remainder of colonic mucosa was normal   COLONOSCOPY WITH ESOPHAGOGASTRODUODENOSCOPY (EGD) N/A 02/01/2014   YCX:KGYJ erosive reflux/gastric polyp/normal rectum/scattered left sided diverticula, normal distal TI. gastric bx negative, fundic gland polyp. next TCS 01/2019.   CYSTECTOMY     pilonidial cyst   ESOPHAGOGASTRODUODENOSCOPY     followed by colonoscopy with snare polypectomy   EYE SURGERY Bilateral    cataract surgery with lens implants   JOINT REPLACEMENT N/A    [employer]rees[employer] 09/14/2020   REVISION TOTAL HIP ARTHROPLASTY Right    right knee replacement  02/2013   SHOULDER ARTHROSCOPY Left 2005   SHOULDER ARTHROSCOPY Right    SPINAL FUSION     SPINAL FUSION  2004   L4-L5   SPINE SURGERY N/A    [employer]reesia 09/14/2020    TOTAL HIP ARTHROPLASTY Right 06/2006   TOTAL HIP ARTHROPLASTY  8/12   TOTAL KNEE ARTHROPLASTY Left 04/18/2020   TOTAL KNEE ARTHROPLASTY Left 04/18/2020   Procedure: LEFT TOTAL KNEE ARTHROPLASTY;  Surgeon: Leandrew Koyanagi, MD;  Location: [place];  Service: Orthopedics;  Laterality: Left;   TOTAL SHOULDER REPLACEMENT Left 08/2005   Social History   Socioeconomic History   Marital status: Widowed    Spouse name: Passed in Jan 2013   Number of children: 2   Years of education: Not on file   Highest education level: Master's degree (e.g., MA, MS, MEng, MEd, MSW, MBA)  Occupational History   Occupation: unemployed    Employer: RETIRED    Comment: retired Education officer, [employer]  Tobacco Use   Smoking status: Never   Smokeless tobacco: Never  Vaping Use   Vaping Use: Never used  Substance and Sexual Activity   Alcohol use: Yes    Alcohol/week: 7.0 standard drinks    Types: 7 Glasses of wine per week    Comment: almost daily- wine, 1 glass per day.   Drug use: No   Sexual activity: Not Currently    Partners: Male    Birth control/protection: Surgical    Comment: TAH  Other Topics Concern   Not on file  Social History Narrative   Lives alone   2 children, 9 grandchildren, 5 great grand   One in [place] and another [place]      Enjoy: reading;       Diet: eats all food groups    Caffeine: 2 cups of coffee, some tea   Water: 6-8 cups day        Wears seat belt    Does not use phone while driving   Oceanographer at home   No weapons    Social Determinants of Health   Financial Resource Strain: Low Risk    Difficulty of Paying Living Expenses: Not hard at all  Food Insecurity: No Food Insecurity   Worried About Charity fundraiser in the Last Year: Never true   Arboriculturist in the Last Year: Never true  Transportation Needs: Unmet Transportation Needs   Lack of Transportation (Medical): Yes   Lack of Transportation (Non-Medical): Yes  Physical Activity: Insufficiently  Active   Days of Exercise per Week: 5 days   Minutes of Exercise per Session: 10 min  Stress: No Stress Concern Present   Feeling of Stress : Only a little  Social Connections: Moderately Isolated   Frequency of Communication with Friends  and Family: More than three times a week   Frequency of Social Gatherings with Friends and Family: Three times a week   Attends Religious Services: 1 to 4 times per year   Active Member of Clubs or Organizations: No   Attends Archivist Meetings: Never   Marital Status: Separated   Family History  Problem Relation Age of Onset   Heart disease Mother    Heart attack Mother    Heart disease Father    Heart attack Father    Stroke Sister    Stroke Maternal Grandfather    Cancer Paternal Grandmother        unknown primary   Stomach cancer Maternal Aunt    Colon cancer Neg Hx    Allergies  Allergen Reactions   Hydrocodone-Acetaminophen Hives and Itching   Current Outpatient Medications  Medication Sig Dispense Refill   ALPRAZolam (XANAX) 0.5 MG tablet Take 0.25-0.5 mg by mouth daily as needed for anxiety.      aspirin EC 81 MG tablet Take 81 mg by mouth daily. Swallow whole.     atorvastatin (LIPITOR) 40 MG tablet TAKE (1) TABLET BY MOUTH AT BEDTIME. 90 tablet 3   bisacodyl (DULCOLAX) 5 MG EC tablet Take 1 tablet by mouth daily as needed for mild constipation (constipation).      Calcium Carb-Cholecalciferol (CALCIUM/VITAMIN D PO) Take 1 tablet by mouth daily.     Cholecalciferol (VITAMIN D) 50 MCG (2000 UT) CAPS Take 2,000 Units by mouth daily.     Cimetidine (TAGAMET PO) Take 1 tablet by mouth daily.     DULoxetine (CYMBALTA) 60 MG capsule Take 120 mg by mouth daily.     estradiol (ESTRACE) 1 MG tablet TAKE 1/2 TABLET BY MOUTH DAILY. 30 tablet 11   gabapentin (NEURONTIN) 100 MG capsule Take 100 mg by mouth 2 (two) times daily as needed (pain).     lamoTRIgine (LAMICTAL) 150 MG tablet Take 150 mg by mouth at bedtime.      levothyroxine (SYNTHROID) 25 MCG tablet Take 1 tablet (25 mcg total) by mouth daily. 90 tablet 1   losartan (COZAAR) 50 MG tablet TAKE (1) TABLET BY MOUTH ONCE DAILY. 90 tablet 3   meloxicam (MOBIC) 7.5 MG tablet Take 1 tablet (7.5 mg total) by mouth daily as needed for up to 14 doses for pain. Do not start taking until finished with steroid taper 30 tablet 2   Multiple Vitamin (MULTIVITAMIN) tablet Take 1 tablet by mouth daily.     niacin (SLO-NIACIN) 500 MG tablet Take 500 mg by mouth daily.     nitroGLYCERIN (NITROSTAT) 0.4 MG SL tablet PLACE 1 TAB UNDER TONGUE EVERY 5 MIN IF NEEDED FOR CHEST PAIN. MAY USE 3 TIMES.NO RELIEF CALL 911. (Patient taking differently: Place 0.4 mg under the tongue every 5 (five) minutes as needed for chest pain. PLACE 1 TAB UNDER TONGUE EVERY 5 MIN IF NEEDED FOR CHEST PAIN. MAY USE 3 TIMES.NO RELIEF CALL 911.) 25 tablet 3   Omega-3 Fatty Acids (FISH OIL) 1000 MG CAPS Take 1,000 mg by mouth daily.     oxyCODONE-acetaminophen (PERCOCET) 5-325 MG tablet Take 1 tablet by mouth every 4 (four) hours as needed for severe pain. 20 tablet 0   polyethylene glycol-electrolytes (NULYTELY) 420 g solution As directed 4000 mL 0   Probiotic Product (PROBIOTIC DAILY PO) Take 1 tablet by mouth daily.      risperiDONE (RISPERDAL) 1 MG tablet Take 0.5 mg by mouth at bedtime.  WELLBUTRIN XL 300 MG 24 hr tablet Take 300 mg by mouth daily.      No current facility-administered medications for this visit.   DG Pelvis Portable  Result Date: 10/11/2021 CLINICAL DATA:  Right hip dislocation. EXAM: PORTABLE PELVIS 1-2 VIEWS COMPARISON:  None. FINDINGS: Generalized osteopenia. No acute fracture or dislocation. Bilateral hip arthroplasties. Successful interval reduction right hip dislocation. IMPRESSION: Successful interval reduction of the right hip dislocation. Electronically Signed   By: Kathreen Devoid M.D.   On: 10/11/2021 15:30   DG Hip Unilat With Pelvis 2-3 Views Right  Result Date:  10/11/2021 CLINICAL DATA:  Hip dislocation EXAM: DG HIP (WITH OR WITHOUT PELVIS) 2-3V RIGHT COMPARISON:  None. FINDINGS: Superior dislocation of right hip arthroplasty. No associated perihardware fracture. Status post left hip total arthroplasty, in expected apposition. IMPRESSION: 1. Superior dislocation of right hip total arthroplasty. No associated perihardware fracture. 2. Left hip total arthroplasty, in expected apposition. Electronically Signed   By: Delanna Ahmadi M.D.   On: 10/11/2021 14:27    Review of Systems:   A ROS was performed including pertinent positives and negatives as documented in the HPI.  Physical Exam :   Constitutional: NAD and appears stated age Neurological: Alert and oriented Psych: Appropriate affect and cooperative Last menstrual period 02/10/1993.   Comprehensive Musculoskeletal Exam:    Right hip leg lengths are equal at this point.  She sits comfortably.  She able to extend at the right knee.  Sensation intact all distributions of the right foot.  Internal rotation of the hip is to 20 degrees 20 degrees of external without pain  Imaging:   Xray (3 views right hip): Intact right hip arthroplasty status post close reduction.  There is a vertical appearing cup on the right  I personally reviewed and interpreted the radiographs.   Assessment:   80 year old female with right posterior hip dislocation which is the first time for this hip which was done over 17 years prior we did discuss the positions of instability and advised against this.  I did discuss that it would be helpful for her to have direct help at home in the ensuing periods to help her be compliant with hip precautions.  I would like to have a home visiting physical therapist come to her house for assistance and to reiterate these precautions.  She may discontinue the immobilizer today.  We did discuss the implications of her vertical appearing acetabulum on the right on the future of possible  instability.  I do believe that is reasonable to trial nonoperative treatment with the first dislocation but should this occur again I would plan to a referral to Dr. Erlinda Hong given that she is previously seen him.  Plan :    -Posterior hip precautions, she may be activity as tolerated and weightbearing as tolerated -Return to clinic in 1 month     I personally saw and evaluated the patient, and participated in the management and treatment plan.  Vanetta Mulders, MD Attending Physician, Orthopedic Surgery  This document was dictated using Dragon voice recognition software. A reasonable attempt at proof reading has been made to minimize errors.

## 2021-10-14 NOTE — ED Provider Notes (Signed)
[facility]  Date/Time: 10/11/2021 2:00 PM Performed by: Carmin Muskrat, MD Authorized by: Carmin Muskrat, MD   Consent:    Consent obtained:  Verbal   Consent given by:  Patient   Risks discussed:  Restricted joint movement, vascular damage, fracture and irreducible dislocation   Alternatives discussed:  Alternative treatment Universal protocol:    Procedure explained and questions answered to patient or proxy's satisfaction: yes     Relevant documents present and verified: yes     Test results available and properly labeled: yes     Imaging studies available: yes     Required blood products, implants, devices, and special equipment available: yes     Site/side marked: yes     Immediately prior to procedure a time out was called: yes     Patient identity confirmed:  Verbally with patientInjury location: hip Location details: right hip Injury type: dislocation Dislocation type: posterior Spontaneous dislocation: yes Prosthesis: yes Pre-procedure neurovascular assessment: neurovascularly intact Pre-procedure distal perfusion: normal Pre-procedure neurological function: normal Pre-procedure range of motion: reduced  Anesthesia: Local anesthesia used: no  Patient sedated: Yes. Refer to sedation procedure documentation for details of sedation. Manipulation performed: yes Reduction method: Stimson maneuver and traction and counter traction Reduction successful: yes X-ray confirmed reduction: yes Immobilization: splint Splint type: long leg Splint Applied by: ED Provider, ED Tech and Ortho [employer] used: aluminum splint Post-procedure neurovascular assessment: post-procedure neurovascularly intact Post-procedure distal perfusion: normal Post-procedure neurological function: normal Post-procedure range of motion: improved   .Sedation  Date/Time: 10/14/2021 6:08 PM Performed by: Carmin Muskrat, MD Authorized by: Carmin Muskrat, MD   Consent:     Consent obtained:  Verbal   Consent given by:  Patient   Risks discussed:  Dysrhythmia, inadequate sedation, nausea, prolonged sedation necessitating reversal and prolonged hypoxia resulting in organ damage   Alternatives discussed:  Analgesia without sedation Universal protocol:    Procedure explained and questions answered to patient or proxy's satisfaction: yes     Relevant documents present and verified: yes     Test results available: yes     Imaging studies available: yes     Required blood products, implants, devices, and special equipment available: yes     Site/side marked: yes     Immediately prior to procedure, a time out was called: yes     Patient identity confirmed:  Verbally with patient Indications:    Procedure performed:  Dislocation reduction   Procedure necessitating sedation performed by:  Physician performing sedation Pre-sedation assessment:    Time since last food or drink:  3   ASA classification: class 2 - patient with mild systemic disease     Mouth opening:  2 finger widths   Thyromental distance:  2 finger widths   Mallampati score:  II - soft palate, uvula, fauces visible   Neck mobility: normal     Pre-sedation assessments completed and reviewed: airway patency, cardiovascular function, hydration status, mental status, nausea/vomiting, pain level, respiratory function and temperature     Pre-sedation assessment completed:  10/11/2021 2:00 PM Immediate pre-procedure details:    Reassessment: Patient reassessed immediately prior to procedure     Reviewed: vital signs, relevant labs/tests and NPO status     Verified: bag valve mask available, emergency equipment available, intubation equipment available, IV patency confirmed, oxygen available and reversal medications available   Procedure details (see MAR for exact dosages):    Preoxygenation:  Nasal cannula   Sedation:  Propofol   Intended level  of sedation: deep   Analgesia:  Fentanyl   Intra-procedure  monitoring:  Blood pressure monitoring, cardiac monitor, continuous pulse oximetry, continuous capnometry, frequent vital sign checks and frequent LOC assessments   Intra-procedure events: none     Total Provider sedation time (minutes):  25 Post-procedure details:    Post-sedation assessment completed:  10/11/2021 3:30 PM   Attendance: Constant attendance by certified staff until patient recovered     Recovery: Patient returned to pre-procedure baseline     Post-sedation assessments completed and reviewed: airway patency, cardiovascular function, hydration status, mental status, nausea/vomiting, pain level, respiratory function and temperature     Patient is stable for discharge or admission: yes     Procedure completion:  Tolerated well, no immediate complications    Carmin Muskrat, MD 10/14/21 1810

## 2021-10-16 ENCOUNTER — Telehealth: Payer: Self-pay | Admitting: Orthopaedic Surgery

## 2021-10-16 NOTE — Telephone Encounter (Signed)
Patient's daughter called. Says they have not heard anything from [facility] for her mom's home therapy. She would like a call back [phone]

## 2021-10-17 NOTE — Telephone Encounter (Signed)
Spoke to patient's daughter and explained mother's insurance does not partner with [facility] so her information was forwarded to [facility] and she should be hearing from them shortly.

## 2021-10-24 ENCOUNTER — Telehealth: Payer: Self-pay | Admitting: Orthopaedic Surgery

## 2021-10-24 NOTE — Telephone Encounter (Signed)
[name] [name] from [facility] health called and is requesting verbal orders. 2X a week for 4 weeks and 1X a week for 4 weeks. Also wanting to know if there is any hip precautions?   CB [phone]

## 2021-10-27 ENCOUNTER — Telehealth: Payer: Self-pay | Admitting: Orthopaedic Surgery

## 2021-10-27 NOTE — Telephone Encounter (Signed)
Received call from Neomia Dear (OT) with [facility] needing verbal orders for HHOT 1 Wk 6. The number to contact Sharyn Lull is [phone]

## 2021-10-31 ENCOUNTER — Ambulatory Visit (INDEPENDENT_AMBULATORY_CARE_PROVIDER_SITE_OTHER): Payer: Medicare PPO | Admitting: Nurse Practitioner

## 2021-10-31 ENCOUNTER — Encounter: Payer: Self-pay | Admitting: Nurse Practitioner

## 2021-10-31 ENCOUNTER — Other Ambulatory Visit: Payer: Self-pay

## 2021-10-31 VITALS — BP 134/78 | HR 85 | Ht <= 58 in | Wt 108.0 lb

## 2021-10-31 DIAGNOSIS — E039 Hypothyroidism, unspecified: Secondary | ICD-10-CM

## 2021-10-31 DIAGNOSIS — R7301 Impaired fasting glucose: Secondary | ICD-10-CM

## 2021-10-31 DIAGNOSIS — E559 Vitamin D deficiency, unspecified: Secondary | ICD-10-CM

## 2021-10-31 DIAGNOSIS — I10 Essential (primary) hypertension: Secondary | ICD-10-CM | POA: Diagnosis not present

## 2021-10-31 DIAGNOSIS — Z0001 Encounter for general adult medical examination with abnormal findings: Secondary | ICD-10-CM

## 2021-10-31 DIAGNOSIS — E782 Mixed hyperlipidemia: Secondary | ICD-10-CM

## 2021-10-31 DIAGNOSIS — M25551 Pain in right hip: Secondary | ICD-10-CM

## 2021-10-31 DIAGNOSIS — Z0000 Encounter for general adult medical examination without abnormal findings: Secondary | ICD-10-CM

## 2021-10-31 NOTE — Patient Instructions (Signed)
LABWORK  NEEDS TO BE DONE BETWEEN 3 TO 7 DAYS BEFORE YOUR NEXT SCEDULED  VISIT.  THIS WILL IMPROVE THE QUALITY OF YOUR CARE.   It is important that you exercise regularly at least 30 minutes 5 times a week.  Think about what you will eat, plan ahead. Choose " clean, green, fresh or frozen" over canned, processed or packaged foods which are more sugary, salty and fatty. 70 to 75% of food eaten should be vegetables and fruit. Three meals at set times with snacks allowed between meals, but they must be fruit or vegetables. Aim to eat over a 12 hour period , example 7 am to 7 pm, and STOP after  your last meal of the day. Drink water,generally about 64 ounces per day, no other drink is as healthy. Fruit juice is best enjoyed in a healthy way, by EATING the fruit.  Thanks for choosing [facility], we consider it a privelige to serve you.

## 2021-10-31 NOTE — Assessment & Plan Note (Signed)
Annual exam as documented.  ?Counseling done include healthy lifestyle involving committing to 150 minutes of exercise per week, heart healthy diet, and attaining healthy weight. The importance of adequate sleep also discussed.  ?Regular use of seat belt and home safety were also discussed . ?Changes in health habits are decided on by patient with goals and time frames set for achieving them. ?Immunization and cancer screening  needs are specifically addressed at this visit.   ?

## 2021-10-31 NOTE — Progress Notes (Signed)
New Patient Office Visit  Subjective:  Patient ID: [name] [name], female    DOB: 1941/10/05  Age: 80 y.o. MRN: [id number]  CC:  Chief Complaint  Patient presents with   Annual Exam    Annual Exam - per pt. No concerns today    HPI [name] [name] presents for annual exam. She has right hip displacement three weeks ago, she was siting in her chair while trying to reach out for something before her hip got displaced she has seen othopedics about this. Has achy pain 6/10 Takes pain med as needed and uses a cane.   DEXA scan mammogram was ordered last month she has not heard from them, she cancelled her colonoscopy due her to her hip displacement.  She had her last COVID booster in [place] this year.  Last eye exam was in March.    Past Medical History:  Diagnosis Date   Anemia    Anxiety    Arthritis    BIPOLAR AFFECTIVE DISORDER 05/11/2009   Qualifier: Diagnosis of  By: Orville Govern, CMA, Carol     Bipolar affective disorder [facility])    Bipolar disorder ([place])    Cataract    Chest pain 05/11/2009   Qualifier: Diagnosis of  By: Orville Govern, CMA, Carol     Chronic back pain    Chronic cough 11/23/2014   Chronic fatigue    Chronic pain syndrome 8/36/6294   Complication of anesthesia    Constipation    Depression    Difficulty in walking(719.7) 07/26/2011   Dizziness    Dyslipidemia    Fecal incontinence 11/23/2014   Fibromyalgia    GERD (gastroesophageal reflux disease)    H/O anxiety disorder 03/05/2013   Overview:  Heart Cath x 2 per patient; most recent 4 yrs ago with "negative" findings per patient; Cardiologist, Dr Dorris Carnes following; last seen 10/2011; most recent episode 11/2012 with admit [facility]) and negative work-up per patient    H/O: hysterectomy    History of back surgery 03/05/2013   History of gastroesophageal reflux (GERD) 03/05/2013   History of kidney stones    Hx of adenomatous colonic polyps 2005   Hyperlipidemia    Hypertension    Hypothyroidism    Knee  pain    Midsternal chest pain 08/19/2015   Muscle weakness (generalized) 04/27/2013   Non-obstructive CAD    a. 08/2015 Cath: LM 40, LCX small, 30ost, 89m, OM2 small, RCA nl/small, EF 55-65%.   Osteopenia    PONV (postoperative nausea and vomiting)    Primary osteoarthritis of left knee 04/17/2020   Scalp laceration 03/01/2020   Sciatica     Past Surgical History:  Procedure Laterality Date   ABDOMINAL HYSTERECTOMY  1994   TAH- DUB, BSO   [place] bunionectomy     bilateral   BREAST BIOPSY Left 1989   BREAST LUMPECTOMY     BREAST SURGERY N/A    [employer]rees[employer] 09/14/2020   BUNIONECTOMY Left 2005   CARDIAC CATHETERIZATION  2010   +CAD   CARDIAC CATHETERIZATION N/A 08/19/2015   Procedure: Left Heart Cath and Coronary Angiography;  Surgeon: Belva Crome, MD;  Location: [place] [facility];  Service: Cardiovascular;  Laterality: N/A;   COLONOSCOPY  01/29/2007   TML:YYTKPT rectum, left-sided diverticula, 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  Remainder of colonic mucosa was normal   COLONOSCOPY WITH ESOPHAGOGASTRODUODENOSCOPY (EGD) N/A 02/01/2014   WSF:KCLE erosive reflux/gastric polyp/normal rectum/scattered left sided diverticula, normal distal TI. gastric bx negative,  fundic gland polyp. next TCS 01/2019.   CYSTECTOMY     pilonidial cyst   ESOPHAGOGASTRODUODENOSCOPY     followed by colonoscopy with snare polypectomy   EYE SURGERY Bilateral    cataract surgery with lens implants   JOINT REPLACEMENT N/A    [employer]rees[employer] 09/14/2020   REVISION TOTAL HIP ARTHROPLASTY Right    right knee replacement  02/2013   SHOULDER ARTHROSCOPY Left 2005   SHOULDER ARTHROSCOPY Right    SPINAL FUSION     SPINAL FUSION  2004   L4-L5   SPINE SURGERY N/A    [employer] 09/14/2020   TOTAL HIP ARTHROPLASTY Right 06/2006   TOTAL HIP ARTHROPLASTY  8/12   TOTAL KNEE ARTHROPLASTY Left 04/18/2020   TOTAL KNEE ARTHROPLASTY Left 04/18/2020   Procedure: LEFT TOTAL KNEE ARTHROPLASTY;  Surgeon: Leandrew Koyanagi,  MD;  Location: [place];  Service: Orthopedics;  Laterality: Left;   TOTAL SHOULDER REPLACEMENT Left 08/2005    Family History  Problem Relation Age of Onset   Heart disease Mother    Heart attack Mother    Heart disease Father    Heart attack Father    Stroke Sister    Heart disease Sister        CABG   Stomach cancer Maternal Aunt    Breast cancer Maternal Aunt    Stroke Maternal Grandfather    Cancer Paternal Grandmother        unknown primary   Colon cancer Neg Hx    Cervical cancer Neg Hx     Social History   Socioeconomic History   Marital status: Widowed    Spouse name: Passed in Jan 2013   Number of children: 2   Years of education: Not on file   Highest education level: Master's degree (e.g., MA, MS, MEng, MEd, MSW, MBA)  Occupational History   Occupation: unemployed    Employer: RETIRED    Comment: retired Education officer, [employer]  Tobacco Use   Smoking status: Never   Smokeless tobacco: Never  Vaping Use   Vaping Use: Never used  Substance and Sexual Activity   Alcohol use: Not Currently    Alcohol/week: 5.0 standard drinks    Types: 5 Glasses of wine per week    Comment: last drink was 3 weeks ago.4-5 times a week, 5 unces each time   Drug use: No   Sexual activity: Not Currently    Partners: Male    Birth control/protection: Surgical    Comment: TAH  Other Topics Concern   Not on file  Social History Narrative   Lives alone   2 children, 9 grandchildren, 7 great grand   One in [place] and other one is in [place], children check on her.      Enjoy: reading;       Diet: eats all food groups    Caffeine: 2 cups of coffee, some tea   Water: 6-8 cups day       Wears seat belt    Does not use phone while driving   Smoke detectors at home   No weapons    Social Determinants of Health   Financial Resource Strain: Low Risk    Difficulty of Paying Living Expenses: Not hard at all  Food Insecurity: No Food Insecurity   Worried About Charity fundraiser in the  Last Year: Never true   Arboriculturist in the Last Year: Never true  Transportation Needs: Unmet Transportation Needs   Lack of Transportation (  Medical): Yes   Lack of Transportation (Non-Medical): Yes  Physical Activity: Insufficiently Active   Days of Exercise per Week: 5 days   Minutes of Exercise per Session: 10 min  Stress: No Stress Concern Present   Feeling of Stress : Only a little  Social Connections: Moderately Isolated   Frequency of Communication with Friends and Family: More than three times a week   Frequency of Social Gatherings with Friends and Family: Three times a week   Attends Religious Services: 1 to 4 times per year   Active Member of Clubs or Organizations: No   Attends Archivist Meetings: Never   Marital Status: Separated  Intimate Partner Violence: Not At Risk   Fear of Current or Ex-Partner: No   Emotionally Abused: No   Physically Abused: No   Sexually Abused: No    ROS Review of Systems  Constitutional: Negative.   HENT: Negative.    Eyes: Negative.   Respiratory: Negative.    Cardiovascular: Negative.   Gastrointestinal: Negative.   Endocrine: Negative.   Genitourinary: Negative.   Musculoskeletal:  Positive for arthralgias. Negative for gait problem.       Right hip displacement three weeks ago, follwed by otho, uses a cane  Skin: Negative.   Allergic/Immunologic: Negative.   Neurological: Negative.   Hematological: Negative.   Psychiatric/Behavioral: Negative.     Objective:   Today's Vitals: BP (!) 153/83 (BP Location: Right Arm, Patient Position: Sitting, Cuff Size: Normal)    Pulse 85    Ht 4\' 9"  (1.448 m)    Wt 108 lb (49 kg)    LMP 02/10/1993    SpO2 97%    BMI 23.37 kg/m   Physical Exam Vitals and nursing note reviewed. Exam conducted with a chaperone present.  Constitutional:      General: She is not in acute distress.    Appearance: Normal appearance. She is normal weight. She is not ill-appearing, toxic-appearing  or diaphoretic.  HENT:     Head: Normocephalic and atraumatic.     Right Ear: Tympanic membrane, ear canal and external ear normal. There is no impacted cerumen.     Left Ear: Tympanic membrane, ear canal and external ear normal. There is no impacted cerumen.     Nose: No congestion or rhinorrhea.     Mouth/Throat:     Mouth: Mucous membranes are moist.     Pharynx: Oropharynx is clear. No oropharyngeal exudate or posterior oropharyngeal erythema.  Eyes:     General: No scleral icterus.       Right eye: No discharge.        Left eye: No discharge.     Extraocular Movements: Extraocular movements intact.     Conjunctiva/sclera: Conjunctivae normal.  Neck:     Vascular: No carotid bruit.  Cardiovascular:     Rate and Rhythm: Normal rate and regular rhythm.     Pulses: Normal pulses.     Heart sounds: Normal heart sounds. No murmur heard.   No friction rub. No gallop.  Pulmonary:     Effort: Pulmonary effort is normal. No respiratory distress.     Breath sounds: Normal breath sounds. No stridor. No wheezing, rhonchi or rales.  Chest:     Chest wall: Deformity present. No mass, lacerations, swelling, tenderness, crepitus or edema.  Breasts:    Tanner Score is 5.     Right: Normal. No swelling, bleeding, inverted nipple, mass, nipple discharge, skin change or tenderness.  Left: Normal. No swelling, bleeding, inverted nipple, mass, nipple discharge, skin change or tenderness.     Comments: Kyphosis.  Abdominal:     General: There is no distension.     Palpations: There is no mass.     Tenderness: There is no abdominal tenderness. There is no right CVA tenderness, left CVA tenderness, guarding or rebound.     Hernia: No hernia is present.  Musculoskeletal:        General: Signs of injury present. No swelling, tenderness or deformity.     Cervical back: Normal range of motion and neck supple. No rigidity or tenderness.     Right lower leg: No edema.     Left lower leg: No edema.      Comments: R hip dislocation  Lymphadenopathy:     Cervical: No cervical adenopathy.     Upper Body:     Right upper body: No supraclavicular, axillary or pectoral adenopathy.     Left upper body: No supraclavicular, axillary or pectoral adenopathy.  Skin:    Capillary Refill: Capillary refill takes 2 to 3 seconds.     Coloration: Skin is not jaundiced or pale.     Findings: No bruising, erythema, lesion or rash.  Neurological:     General: No focal deficit present.     Mental Status: She is alert and oriented to person, place, and time. Mental status is at baseline.     Cranial Nerves: No cranial nerve deficit.     Sensory: No sensory deficit.     Motor: No weakness.     Coordination: Coordination normal.     Gait: Gait normal.  Psychiatric:        Mood and Affect: Mood normal.        Behavior: Behavior normal.        Thought Content: Thought content normal.        Judgment: Judgment normal.    Assessment & Plan:   Problem List Items Addressed This Visit   None   Outpatient Encounter Medications as of 10/31/2021  Medication Sig   ALPRAZolam (XANAX) 0.5 MG tablet Take 0.25-0.5 mg by mouth daily as needed for anxiety.    aspirin EC 81 MG tablet Take 81 mg by mouth daily. Swallow whole.   atorvastatin (LIPITOR) 40 MG tablet TAKE (1) TABLET BY MOUTH AT BEDTIME.   bisacodyl (DULCOLAX) 5 MG EC tablet Take 1 tablet by mouth daily as needed for mild constipation (constipation).    Calcium Carb-Cholecalciferol (CALCIUM/VITAMIN D PO) Take 1 tablet by mouth daily.   Cholecalciferol (VITAMIN D) 50 MCG (2000 UT) CAPS Take 2,000 Units by mouth daily.   Cimetidine (TAGAMET PO) Take 1 tablet by mouth daily.   DULoxetine (CYMBALTA) 60 MG capsule Take 120 mg by mouth daily.   estradiol (ESTRACE) 1 MG tablet TAKE 1/2 TABLET BY MOUTH DAILY.   gabapentin (NEURONTIN) 100 MG capsule Take 100 mg by mouth 2 (two) times daily as needed (pain).   lamoTRIgine (LAMICTAL) 150 MG tablet Take 150 mg  by mouth at bedtime.   levothyroxine (SYNTHROID) 25 MCG tablet Take 1 tablet (25 mcg total) by mouth daily.   losartan (COZAAR) 50 MG tablet TAKE (1) TABLET BY MOUTH ONCE DAILY.   meloxicam (MOBIC) 7.5 MG tablet Take 1 tablet (7.5 mg total) by mouth daily as needed for up to 14 doses for pain. Do not start taking until finished with steroid taper   Multiple Vitamin (MULTIVITAMIN) tablet Take 1 tablet by mouth  daily.   niacin (SLO-NIACIN) 500 MG tablet Take 500 mg by mouth daily.   nitroGLYCERIN (NITROSTAT) 0.4 MG SL tablet PLACE 1 TAB UNDER TONGUE EVERY 5 MIN IF NEEDED FOR CHEST PAIN. MAY USE 3 TIMES.NO RELIEF CALL 911. (Patient taking differently: Place 0.4 mg under the tongue every 5 (five) minutes as needed for chest pain. PLACE 1 TAB UNDER TONGUE EVERY 5 MIN IF NEEDED FOR CHEST PAIN. MAY USE 3 TIMES.NO RELIEF CALL 911.)   Omega-3 Fatty Acids (FISH OIL) 1000 MG CAPS Take 1,000 mg by mouth daily.   polyethylene glycol-electrolytes (NULYTELY) 420 g solution As directed   Probiotic Product (PROBIOTIC DAILY PO) Take 1 tablet by mouth daily.    risperiDONE (RISPERDAL) 1 MG tablet Take 0.5 mg by mouth at bedtime.   WELLBUTRIN XL 300 MG 24 hr tablet Take 300 mg by mouth daily.    oxyCODONE-acetaminophen (PERCOCET) 5-325 MG tablet Take 1 tablet by mouth every 4 (four) hours as needed for severe pain. (Patient not taking: Reported on 10/31/2021)   No facility-administered encounter medications on file as of 10/31/2021.    Follow-up: No follow-ups on file.   Renee Rival, FNP

## 2021-10-31 NOTE — Assessment & Plan Note (Signed)
Had a hip dislocation 3 weeks ago, followed by otho, takes PRN pain medications. right hip feels warm , strength normal , pd pulse was palpable, able to walk without problem.

## 2021-11-14 ENCOUNTER — Ambulatory Visit (HOSPITAL_BASED_OUTPATIENT_CLINIC_OR_DEPARTMENT_OTHER): Payer: Medicare PPO | Admitting: Orthopaedic Surgery

## 2021-11-14 ENCOUNTER — Other Ambulatory Visit: Payer: Self-pay

## 2021-11-14 DIAGNOSIS — M25351 Other instability, right hip: Secondary | ICD-10-CM

## 2021-11-14 NOTE — Progress Notes (Signed)
Chief Complaint: right hip pain     History of Present Illness:   11/14/2021: Presents today following a right hip dislocation.  Overall this is been doing very well.  She did not have any recurrences.  She is working with physical therapy and her strength is dramatically improved.  She has been compliant with hip precautions.  Here today with her son.   [name] [name] is a 81 y.o. female who presents after right hip dislocation sustained on October 11, 2021.  This was reduced in the emergency room.  She states that she had the hip arthroplasty done at [facility] over 10 years prior.  She is not had any issues with the side dislocating prior to now.  She states that she was sitting and went to lean forward at which point she felt it dislocate.  Of note she does have a history of left-sided arthroplasty done 10 years prior which also dislocated immediately 6 months postop and subsequently did not have any issues.  She is previous seen Dr. Erlinda Hong for knee arthroplasty which is doing well.  She is taking oxycodone particularly at night for pain.  She is here with her daughter today who is helping her with mobilization    Surgical History:   None  PMH/PSH/Family History/Social History/Meds/Allergies:    Past Medical History:  Diagnosis Date   Anemia    Anxiety    Arthritis    BIPOLAR AFFECTIVE DISORDER 05/11/2009   Qualifier: Diagnosis of  By: Orville Govern, CMA, Carol     Bipolar affective disorder [facility])    Bipolar disorder ([place])    Cataract    Chest pain 05/11/2009   Qualifier: Diagnosis of  By: Orville Govern, CMA, Carol     Chronic back pain    Chronic cough 11/23/2014   Chronic fatigue    Chronic pain syndrome 3/55/9741   Complication of anesthesia    Constipation    Depression    Difficulty in walking(719.7) 07/26/2011   Dizziness    Dyslipidemia    Fecal incontinence 11/23/2014   Fibromyalgia    GERD (gastroesophageal reflux disease)    H/O anxiety disorder  03/05/2013   Overview:  Heart Cath x 2 per patient; most recent 4 yrs ago with "negative" findings per patient; Cardiologist, Dr Dorris Carnes following; last seen 10/2011; most recent episode 11/2012 with admit [facility]) and negative work-up per patient    H/O: hysterectomy    History of back surgery 03/05/2013   History of gastroesophageal reflux (GERD) 03/05/2013   History of kidney stones    Hx of adenomatous colonic polyps 2005   Hyperlipidemia    Hypertension    Hypothyroidism    Knee pain    Midsternal chest pain 08/19/2015   Muscle weakness (generalized) 04/27/2013   Non-obstructive CAD    a. 08/2015 Cath: LM 40, LCX small, 30ost, 63m, OM2 small, RCA nl/small, EF 55-65%.   Osteopenia    PONV (postoperative nausea and vomiting)    Primary osteoarthritis of left knee 04/17/2020   Scalp laceration 03/01/2020   Sciatica    Past Surgical History:  Procedure Laterality Date   ABDOMINAL HYSTERECTOMY  1994   TAH- DUB, BSO   [place] bunionectomy     bilateral   BREAST BIOPSY Left 1989   BREAST LUMPECTOMY  BREAST SURGERY N/A    Phreesia 09/14/2020   BUNIONECTOMY Left 2005   CARDIAC CATHETERIZATION  2010   +CAD   CARDIAC CATHETERIZATION N/A 08/19/2015   Procedure: Left Heart Cath and Coronary Angiography;  Surgeon: Belva Crome, MD;  Location: [place] [facility];  Service: Cardiovascular;  Laterality: N/A;   COLONOSCOPY  01/29/2007   YBO:FBPZWC rectum, left-sided diverticula, 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  Remainder of colonic mucosa was normal   COLONOSCOPY WITH ESOPHAGOGASTRODUODENOSCOPY (EGD) N/A 02/01/2014   HEN:IDPO erosive reflux/gastric polyp/normal rectum/scattered left sided diverticula, normal distal TI. gastric bx negative, fundic gland polyp. next TCS 01/2019.   CYSTECTOMY     pilonidial cyst   ESOPHAGOGASTRODUODENOSCOPY     followed by colonoscopy with snare polypectomy   EYE SURGERY Bilateral    cataract surgery with lens implants    JOINT REPLACEMENT N/A    Phreesia 09/14/2020   REVISION TOTAL HIP ARTHROPLASTY Right    right knee replacement  02/2013   SHOULDER ARTHROSCOPY Left 2005   SHOULDER ARTHROSCOPY Right    SPINAL FUSION     SPINAL FUSION  2004   L4-L5   SPINE SURGERY N/A    [employer]reesia 09/14/2020   TOTAL HIP ARTHROPLASTY Right 06/2006   TOTAL HIP ARTHROPLASTY  8/12   TOTAL KNEE ARTHROPLASTY Left 04/18/2020   TOTAL KNEE ARTHROPLASTY Left 04/18/2020   Procedure: LEFT TOTAL KNEE ARTHROPLASTY;  Surgeon: Leandrew Koyanagi, MD;  Location: [place];  Service: Orthopedics;  Laterality: Left;   TOTAL SHOULDER REPLACEMENT Left 08/2005   Social History   Socioeconomic History   Marital status: Widowed    Spouse name: Passed in Jan 2013   Number of children: 2   Years of education: Not on file   Highest education level: Master's degree (e.g., MA, MS, MEng, MEd, MSW, MBA)  Occupational History   Occupation: unemployed    Employer: RETIRED    Comment: retired Education officer, [employer]  Tobacco Use   Smoking status: Never   Smokeless tobacco: Never  Vaping Use   Vaping Use: Never used  Substance and Sexual Activity   Alcohol use: Not Currently    Alcohol/week: 5.0 standard drinks    Types: 5 Glasses of wine per week    Comment: last drink was 3 weeks ago.4-5 times a week, 5 unces each time   Drug use: No   Sexual activity: Not Currently    Partners: Male    Birth control/protection: Surgical    Comment: TAH  Other Topics Concern   Not on file  Social History Narrative   Lives alone   2 children, 9 grandchildren, 7 great grand   One in [place] and other one is in [place], children check on her.      Enjoy: reading;       Diet: eats all food groups    Caffeine: 2 cups of coffee, some tea   Water: 6-8 cups day       Wears seat belt    Does not use phone while driving   Smoke detectors at home   No weapons    Social Determinants of Health   Financial Resource Strain: Low Risk    Difficulty of Paying Living Expenses:  Not hard at all  Food Insecurity: No Food Insecurity   Worried About Charity fundraiser in the Last Year: Never true   Ran Out of Food in the Last Year: Never true  Transportation Needs: Unmet Transportation Needs   Lack  of Transportation (Medical): Yes   Lack of Transportation (Non-Medical): Yes  Physical Activity: Insufficiently Active   Days of Exercise per Week: 5 days   Minutes of Exercise per Session: 10 min  Stress: No Stress Concern Present   Feeling of Stress : Only a little  Social Connections: Moderately Isolated   Frequency of Communication with Friends and Family: More than three times a week   Frequency of Social Gatherings with Friends and Family: Three times a week   Attends Religious Services: 1 to 4 times per year   Active Member of Clubs or Organizations: No   Attends Archivist Meetings: Never   Marital Status: Separated   Family History  Problem Relation Age of Onset   Heart disease Mother    Heart attack Mother    Heart disease Father    Heart attack Father    Stroke Sister    Heart disease Sister        CABG   Stomach cancer Maternal Aunt    Breast cancer Maternal Aunt    Stroke Maternal Grandfather    Cancer Paternal Grandmother        unknown primary   Colon cancer Neg Hx    Cervical cancer Neg Hx    Allergies  Allergen Reactions   Hydrocodone-Acetaminophen Hives and Itching   Current Outpatient Medications  Medication Sig Dispense Refill   ALPRAZolam (XANAX) 0.5 MG tablet Take 0.25-0.5 mg by mouth daily as needed for anxiety.      aspirin EC 81 MG tablet Take 81 mg by mouth daily. Swallow whole.     atorvastatin (LIPITOR) 40 MG tablet TAKE (1) TABLET BY MOUTH AT BEDTIME. 90 tablet 3   bisacodyl (DULCOLAX) 5 MG EC tablet Take 1 tablet by mouth daily as needed for mild constipation (constipation).      Calcium Carb-Cholecalciferol (CALCIUM/VITAMIN D PO) Take 1 tablet by mouth daily.     Cholecalciferol (VITAMIN D) 50 MCG (2000 UT)  CAPS Take 2,000 Units by mouth daily.     Cimetidine (TAGAMET PO) Take 1 tablet by mouth daily.     DULoxetine (CYMBALTA) 60 MG capsule Take 120 mg by mouth daily.     estradiol (ESTRACE) 1 MG tablet TAKE 1/2 TABLET BY MOUTH DAILY. 30 tablet 11   gabapentin (NEURONTIN) 100 MG capsule Take 100 mg by mouth 2 (two) times daily as needed (pain).     lamoTRIgine (LAMICTAL) 150 MG tablet Take 150 mg by mouth at bedtime.     levothyroxine (SYNTHROID) 25 MCG tablet Take 1 tablet (25 mcg total) by mouth daily. 90 tablet 1   losartan (COZAAR) 50 MG tablet TAKE (1) TABLET BY MOUTH ONCE DAILY. 90 tablet 3   meloxicam (MOBIC) 7.5 MG tablet Take 1 tablet (7.5 mg total) by mouth daily as needed for up to 14 doses for pain. Do not start taking until finished with steroid taper 30 tablet 2   Multiple Vitamin (MULTIVITAMIN) tablet Take 1 tablet by mouth daily.     niacin (SLO-NIACIN) 500 MG tablet Take 500 mg by mouth daily.     nitroGLYCERIN (NITROSTAT) 0.4 MG SL tablet PLACE 1 TAB UNDER TONGUE EVERY 5 MIN IF NEEDED FOR CHEST PAIN. MAY USE 3 TIMES.NO RELIEF CALL 911. (Patient taking differently: Place 0.4 mg under the tongue every 5 (five) minutes as needed for chest pain. PLACE 1 TAB UNDER TONGUE EVERY 5 MIN IF NEEDED FOR CHEST PAIN. MAY USE 3 TIMES.NO RELIEF CALL 911.) 25 tablet  3   Omega-3 Fatty Acids (FISH OIL) 1000 MG CAPS Take 1,000 mg by mouth daily.     oxyCODONE-acetaminophen (PERCOCET) 5-325 MG tablet Take 1 tablet by mouth every 4 (four) hours as needed for severe pain. 20 tablet 0   polyethylene glycol-electrolytes (NULYTELY) 420 g solution As directed (Patient not taking: Reported on 10/31/2021) 4000 mL 0   Probiotic Product (PROBIOTIC DAILY PO) Take 1 tablet by mouth daily.      risperiDONE (RISPERDAL) 1 MG tablet Take 0.5 mg by mouth at bedtime.     WELLBUTRIN XL 300 MG 24 hr tablet Take 300 mg by mouth daily.      No current facility-administered medications for this visit.   No results  found.  Review of Systems:   A ROS was performed including pertinent positives and negatives as documented in the HPI.  Physical Exam :   Constitutional: NAD and appears stated age Neurological: Alert and oriented Psych: Appropriate affect and cooperative Last menstrual period 02/10/1993.   Comprehensive Musculoskeletal Exam:    Right hip leg lengths are equal at this point.  She sits comfortably.  She able to extend at the right knee.  Sensation intact all distributions of the right foot.  Internal rotation of the hip is to 20 degrees 20 degrees of external without pain  Imaging:   Xray (3 views right hip): Intact right hip arthroplasty status post close reduction.  There is a vertical appearing cup on the right  I personally reviewed and interpreted the radiographs.   Assessment:   81 year old female with right posterior hip dislocation which is the first time for this hip which was done over 17 years prior we did discuss the positions of instability and advised against this.  I have recommended posterior hip dislocation precautions for an additional 1 month. Plan :    -She will return to clinic on an as-needed basis    I personally saw and evaluated the patient, and participated in the management and treatment plan.  Vanetta Mulders, MD Attending Physician, Orthopedic Surgery  This document was dictated using Dragon voice recognition software. A reasonable attempt at proof reading has been made to minimize errors.

## 2021-11-22 ENCOUNTER — Telehealth: Payer: Self-pay | Admitting: Orthopaedic Surgery

## 2021-11-22 NOTE — Telephone Encounter (Signed)
Orders signed and faxed [phone]

## 2021-11-22 NOTE — Telephone Encounter (Signed)
[name] [name] with [name] called stating they have an outstanding order for the pt. [name] states the order is for home health care and the plan or care; she states the order# is [id number]. [name] would like to have this filled out and faxed back today please.   Fax# [phone]  CB# [phone]

## 2021-11-23 ENCOUNTER — Telehealth: Payer: Self-pay | Admitting: Orthopaedic Surgery

## 2021-11-23 NOTE — Telephone Encounter (Signed)
LMOM stating Dr. Sammuel Hines was okay with 2x/wk for 3 weeks before discharge

## 2021-11-23 NOTE — Telephone Encounter (Signed)
P.T calling on behalf of pt to  extend P.T to 2x a week for 3 weeks then discharge pt after. The best contact for any questions is Amy at [phone].

## 2021-11-24 ENCOUNTER — Other Ambulatory Visit: Payer: Self-pay | Admitting: Cardiology

## 2021-12-07 ENCOUNTER — Ambulatory Visit (HOSPITAL_COMMUNITY)
Admission: RE | Admit: 2021-12-07 | Discharge: 2021-12-07 | Disposition: A | Discharge status: Home / Self Care | Payer: Medicare PPO | Source: Ambulatory Visit | Attending: Internal Medicine | Admitting: Internal Medicine

## 2021-12-07 ENCOUNTER — Other Ambulatory Visit: Payer: Self-pay

## 2021-12-07 ENCOUNTER — Telehealth: Payer: Self-pay

## 2021-12-07 DIAGNOSIS — Z1231 Encounter for screening mammogram for malignant neoplasm of breast: Secondary | ICD-10-CM | POA: Insufficient documentation

## 2021-12-07 DIAGNOSIS — Z13820 Encounter for screening for osteoporosis: Secondary | ICD-10-CM | POA: Diagnosis not present

## 2021-12-07 DIAGNOSIS — Z780 Asymptomatic menopausal state: Secondary | ICD-10-CM | POA: Diagnosis not present

## 2021-12-07 DIAGNOSIS — M8580 Other specified disorders of bone density and structure, unspecified site: Secondary | ICD-10-CM

## 2021-12-07 NOTE — Telephone Encounter (Signed)
Patient calling about test results, return call.

## 2021-12-08 NOTE — Telephone Encounter (Signed)
Spoke with pt she states verbal understanding

## 2021-12-13 ENCOUNTER — Other Ambulatory Visit: Payer: Self-pay

## 2021-12-13 DIAGNOSIS — E039 Hypothyroidism, unspecified: Secondary | ICD-10-CM

## 2021-12-13 MED ORDER — LEVOTHYROXINE SODIUM 25 MCG PO TABS: 25.0000 ug | ORAL_TABLET | Freq: Every day | ORAL | 1 refills | Status: AC

## 2021-12-27 ENCOUNTER — Telehealth: Payer: Self-pay | Admitting: Orthopaedic Surgery

## 2021-12-27 NOTE — Telephone Encounter (Signed)
Error

## 2021-12-28 ENCOUNTER — Telehealth: Payer: Self-pay | Admitting: *Deleted

## 2021-12-28 NOTE — Telephone Encounter (Signed)
She was supposed to have a colonoscopy and an upper endoscopy (dysphagia). Is she still wanting the EGD. Will Dr. Gala Romney be doing? If he does it, and its going to not be in March, then I would say she needs ov because we have not seen her since 07/2021. But if can be done between now and march, we can triage. Be sure to get current medication list.

## 2021-12-28 NOTE — Telephone Encounter (Signed)
Pt came through office in Nov 2022.  She was supposed to have procedure but had to have hip surgery.  Now ready to reschedule.  Will she need another ov due to age and recent surgery or is it ok for me to triage?

## 2021-12-28 NOTE — Telephone Encounter (Signed)
PATIENT WAS SUPPOSED TO BE SCHEDULED FOR A TCS IN January BUT SHE DISLOCATED HER HIP   DOES SHE NEED ANOTHER OFFICE VISIT OR CAN SHE BE CALLED AND SCHEDULED?

## 2022-01-01 ENCOUNTER — Encounter: Payer: Self-pay | Admitting: Internal Medicine

## 2022-01-01 NOTE — Telephone Encounter (Signed)
Pt is a pt of Dr. Roseanne Kaufman.  She will need ov since procedure can not be done between now and March with Dr. Gala Romney.  Erline Levine [place]:  Can you arrange ov for pt please?

## 2022-01-04 ENCOUNTER — Ambulatory Visit: Payer: Medicare PPO | Admitting: Nurse Practitioner

## 2022-01-31 ENCOUNTER — Other Ambulatory Visit: Payer: Self-pay | Admitting: Orthopaedic Surgery

## 2022-02-05 ENCOUNTER — Ambulatory Visit: Payer: Medicare PPO | Admitting: Nurse Practitioner

## 2022-02-06 ENCOUNTER — Ambulatory Visit: Payer: Medicare PPO | Admitting: Nurse Practitioner

## 2022-02-14 ENCOUNTER — Ambulatory Visit: Payer: Medicare PPO | Admitting: Nurse Practitioner

## 2022-02-14 ENCOUNTER — Encounter: Payer: Self-pay | Admitting: Nurse Practitioner

## 2022-02-14 VITALS — BP 133/82 | HR 76 | Ht <= 58 in | Wt 113.0 lb

## 2022-02-14 DIAGNOSIS — I10 Essential (primary) hypertension: Secondary | ICD-10-CM | POA: Diagnosis not present

## 2022-02-14 DIAGNOSIS — E559 Vitamin D deficiency, unspecified: Secondary | ICD-10-CM

## 2022-02-14 DIAGNOSIS — R7301 Impaired fasting glucose: Secondary | ICD-10-CM | POA: Diagnosis not present

## 2022-02-14 DIAGNOSIS — E039 Hypothyroidism, unspecified: Secondary | ICD-10-CM | POA: Diagnosis not present

## 2022-02-14 DIAGNOSIS — E782 Mixed hyperlipidemia: Secondary | ICD-10-CM

## 2022-02-14 DIAGNOSIS — Z96643 Presence of artificial hip joint, bilateral: Secondary | ICD-10-CM | POA: Diagnosis not present

## 2022-02-14 NOTE — Patient Instructions (Signed)
It is important that you exercise regularly as tolerated at least 30 minutes 5 times a week.  ?Think about what you will eat, plan ahead. ?Choose " clean, green, fresh or frozen" over canned, processed or packaged foods which are more sugary, salty and fatty. ?70 to 75% of food eaten should be vegetables and fruit. ?Three meals at set times with snacks allowed between meals, but they must be fruit or vegetables. ?Aim to eat over a 12 hour period , example 7 am to 7 pm, and STOP after  your last meal of the day. ?Drink water,generally about 64 ounces per day, no other drink is as healthy. Fruit juice is best enjoyed in a healthy way, by EATING the fruit. ? ?Thanks for choosing [facility], we consider it a privelige to serve you. ? ?

## 2022-02-14 NOTE — Assessment & Plan Note (Signed)
BP Readings from Last 3 Encounters:  ?02/14/22 133/82  ?10/31/21 134/78  ?10/11/21 132/78  ?Condition well-controlled ?Continue losartan 50 mg daily ?CMP plus EGFR today ?

## 2022-02-14 NOTE — Progress Notes (Signed)
? ?  [name] [name]     MRN: [id number]      DOB: Sep 19, 1941 ? ? ?HPI ?[name] [name] with past medical history of CAD, hypertension, hypothyroidism, hyperlipidemia, vitamin D deficiency, degenerative scoliosis in adult patient is here for follow up and re-evaluation of chronic medical conditions and labs ?.  ? ?The PT denies any adverse reactions to current medications since the last visit.  ?There are no new concerns.  ? ? ?ROS ?Denies recent fever or chills. ?Denies sinus pressure, nasal congestion, ear pain or sore throat. ?Denies chest congestion, productive cough or wheezing. ?Denies chest pains, palpitations and leg swelling ?Denies abdominal pain, nausea, vomiting,diarrhea or constipation.   ?Denies dysuria, frequency, hesitancy or incontinence. ?Has chronic joint pain and gait instability  ?Denies headaches, seizures, numbness, or tingling. ?Denies depression, anxiety or insomnia. ? ? ? ?PE ? ?BP 133/82 (BP Location: Left Arm, Patient Position: Sitting, Cuff Size: Normal)   Pulse 76   Ht $R'4\' 8"'jp$  (1.422 m)   Wt 113 lb (51.3 kg)   LMP 02/10/1993   SpO2 96%   BMI 25.33 kg/m?  ? ?Patient alert and oriented and in no cardiopulmonary distress. ? ?Chest: Clear to auscultation bilaterally. ? ?CVS: S1, S2 no murmurs, no S3.Regular rate. ? ?ABD: Soft non tender.  ? ?Ext: No edema ? ?MS: adequate ROM spine, shoulders, hips and knees uses a cane for balancing ? ?Psych: Good eye contact, normal affect. Memory intact not anxious or depressed appearing. ? ?CNS: CN 2-12 intact, power,  normal throughout.no focal deficits noted. ? ? ?Assessment & Plan ? ?HTN (hypertension) ?BP Readings from Last 3 Encounters:  ?02/14/22 133/82  ?10/31/21 134/78  ?10/11/21 132/78  ?Condition well-controlled ?Continue losartan 50 mg daily ?CMP plus EGFR today ? ?Hypothyroidism ?Levothyroxine 25 mcg daily ?Thyroid labs today ? ?Vitamin D deficiency ?Takes vitamin D 2000 units daily ?Check labs today ? ?Hyperlipidemia ?On atorvastatin 40 mg  daily ?Check lipid panel today ?Lab Results  ?Component Value Date  ? CHOL 188 04/03/2021  ? HDL 95 04/03/2021  ? [place] 73 04/03/2021  ? TRIG 102 04/03/2021  ? CHOLHDL 2.0 04/03/2021  ? ? ?H/O bilateral hip replacements ?Using a cane  for stability ?takes meloxicam 7.5 mg BID PRN ?Recently completed physical therapy ?Need to prevent fall discussed with patient verbalized understanding.  ?

## 2022-02-14 NOTE — Assessment & Plan Note (Signed)
Using a cane  for stability ?takes meloxicam 7.5 mg BID PRN ?Recently completed physical therapy ?Need to prevent fall discussed with patient verbalized understanding. ?

## 2022-02-14 NOTE — Assessment & Plan Note (Signed)
Takes vitamin D 2000 units daily ?Check labs today ?

## 2022-02-14 NOTE — Assessment & Plan Note (Signed)
On atorvastatin 40 mg daily ?Check lipid panel today ?Lab Results  ?Component Value Date  ? CHOL 188 04/03/2021  ? HDL 95 04/03/2021  ? [place] 73 04/03/2021  ? TRIG 102 04/03/2021  ? CHOLHDL 2.0 04/03/2021  ? ?

## 2022-02-14 NOTE — Assessment & Plan Note (Signed)
Levothyroxine 25 mcg daily ?Thyroid labs today ?

## 2022-02-15 LAB — VITAMIN D 25 HYDROXY (VIT D DEFICIENCY, FRACTURES): Vit D, 25-Hydroxy: 64.8 ng/mL (ref 30.0–100.0)

## 2022-02-16 LAB — CMP14+EGFR
ALT: 32 IU/L (ref 0–32)
AST: 33 IU/L (ref 0–40)
Albumin/Globulin Ratio: 1.6 (ref 1.2–2.2)
Albumin: 4.2 g/dL (ref 3.7–4.7)
Alkaline Phosphatase: 92 IU/L (ref 44–121)
BUN/Creatinine Ratio: 15 (ref 12–28)
BUN: 14 mg/dL (ref 8–27)
Bilirubin Total: 0.3 mg/dL (ref 0.0–1.2)
CO2: 24 mmol/L (ref 20–29)
Calcium: 10.6 mg/dL — ABNORMAL HIGH (ref 8.7–10.3)
Chloride: 100 mmol/L (ref 96–106)
Creatinine, Ser: 0.93 mg/dL (ref 0.57–1.00)
Globulin, Total: 2.6 g/dL (ref 1.5–4.5)
Glucose: 111 mg/dL — ABNORMAL HIGH (ref 70–99)
Potassium: 4.4 mmol/L (ref 3.5–5.2)
Sodium: 142 mmol/L (ref 134–144)
Total Protein: 6.8 g/dL (ref 6.0–8.5)
eGFR: 63 mL/min/{1.73_m2} (ref >59)

## 2022-02-16 LAB — LIPID PANEL
Chol/HDL Ratio: 2.2 ratio (ref 0.0–4.4)
Cholesterol, Total: 183 mg/dL (ref 100–199)
HDL: 85 mg/dL (ref >39)
LDL Chol Calc (NIH): 89 mg/dL (ref 0–99)
Triglycerides: 47 mg/dL (ref 0–149)
VLDL Cholesterol Cal: 9 mg/dL (ref 5–40)

## 2022-02-16 LAB — HEMOGLOBIN A1C
Est. average glucose Bld gHb Est-mCnc: 117 mg/dL
Hgb A1c MFr Bld: 5.7 % — ABNORMAL HIGH (ref 4.8–5.6)

## 2022-02-16 LAB — TSH+FREE T4
Free T4: 1.09 ng/dL (ref 0.82–1.77)
TSH: 3.11 u[IU]/mL (ref 0.450–4.500)

## 2022-02-19 ENCOUNTER — Other Ambulatory Visit: Payer: Self-pay | Admitting: Nurse Practitioner

## 2022-02-19 DIAGNOSIS — E782 Mixed hyperlipidemia: Secondary | ICD-10-CM

## 2022-02-19 MED ORDER — ATORVASTATIN CALCIUM 80 MG PO TABS: 80.0000 mg | ORAL_TABLET | Freq: Every day | ORAL | 3 refills | Status: DC

## 2022-02-26 ENCOUNTER — Other Ambulatory Visit: Payer: Self-pay | Admitting: Internal Medicine

## 2022-02-26 DIAGNOSIS — M8580 Other specified disorders of bone density and structure, unspecified site: Secondary | ICD-10-CM

## 2022-02-26 DIAGNOSIS — N951 Menopausal and female climacteric states: Secondary | ICD-10-CM

## 2022-04-12 ENCOUNTER — Ambulatory Visit: Payer: Medicare PPO | Admitting: Nurse Practitioner

## 2022-04-17 ENCOUNTER — Telehealth: Payer: Self-pay | Admitting: Gastroenterology

## 2022-04-17 ENCOUNTER — Encounter: Payer: Self-pay | Admitting: Gastroenterology

## 2022-04-17 ENCOUNTER — Telehealth (INDEPENDENT_AMBULATORY_CARE_PROVIDER_SITE_OTHER): Payer: Medicare PPO | Admitting: Gastroenterology

## 2022-04-17 ENCOUNTER — Telehealth: Payer: Self-pay

## 2022-04-17 VITALS — Ht <= 58 in | Wt 110.0 lb

## 2022-04-17 DIAGNOSIS — Z86010 Personal history of colonic polyps: Secondary | ICD-10-CM | POA: Diagnosis not present

## 2022-04-17 DIAGNOSIS — R1319 Other dysphagia: Secondary | ICD-10-CM | POA: Diagnosis not present

## 2022-04-17 DIAGNOSIS — K219 Gastro-esophageal reflux disease without esophagitis: Secondary | ICD-10-CM

## 2022-04-17 NOTE — Telephone Encounter (Signed)
Will call pt when Dr. Roseanne Kaufman future schedule is available.

## 2022-04-17 NOTE — Telephone Encounter (Signed)
Patient has consented for Telehealth.

## 2022-04-17 NOTE — Patient Instructions (Addendum)
We will arrange for you to have an upper endoscopy with possible stretching of your esophagus and colonoscopy in the near future with Dr. Gala Romney.   For trouble with swallowing: Avoid tough textures. Chopped meats finely. Eat slowly, take small bites, chew thoroughly, drink plenty of liquids throughout meals. If something gets stuck in your esophagus and will not come up or go down, proceed to the emergency room.   For reflux: Continue cimetidine daily. Avoid spicy/acidic foods.    For constipation: You can take Benefiber 2 teaspoons daily to twice daily.  If stools are not soft or productive, you can add Miralax one capful once to twice daily as needed.  Your bowel movements need to be soft and regular to make sure your bowel prep will be good for the colonoscopy. If this regimen is not helping, please let me know so we can provide additional recommendations.

## 2022-04-17 NOTE — Progress Notes (Signed)
Primary Care Physician:  Noreene Larsson, NP Primary GI:  Garfield Cornea, MD   Patient Location: Home  Provider Location: [facility] office  Reason for Visit:  Chief Complaint  Patient presents with   Colonoscopy    Needs to schedule colonoscopy and upper endoscopy.      Persons present on the virtual encounter, with roles: Patient, myself (provider),Tammy [place], CMA (updated meds and allergies)  Total time (minutes) spent on medical discussion: 12 minutes  Due to COVID-19, visit was conducted using Mychart video method.  Visit was requested by patient.  Virtual Visit via [place] video  I connected with Norman Herrlich on 04/17/22 at  8:30 AM EDT by Mychart video and verified that I am speaking with the correct person using two identifiers.   I discussed the limitations, risks, security and privacy concerns of performing an evaluation and management service by telephone/video and the availability of in person appointments. I also discussed with the patient that there may be a patient responsible charge related to this service. The patient expressed understanding and agreed to proceed.   HPI:   [name] [name] is a 81 y.o. female who presents for virtual visit regarding rescheduling procedures. She was last seen in 09/2021. At that time she was schedules for EGD due to dysphagia and colonoscopy due to h/o adenomatous colon polyps.   Last EGD in 2015: mild erosive reflux esophagitis, gastric fundic gland polyp. Negative gastric biopsies. Colonoscopy in 2015, scattered left sided diverticula.   Today: Acid reflux "fair". Fair control of heartburn. Biggest problem swallowing solids, some liquids, and large pills. Some abdominal pain, around umbilicus. Not sure what aggravates it. Has had some issues with BMs, terrible urgency and had incontinence for the first time recently. After the episode she had diarrhea most of the day. Never had this issue of incontinence before. BMs are varying.  Good BM twice per week. Other days just smaller movements. No nocturnal symptoms. No melena, brbpr. No weight loss. Appetite fair.   Current Outpatient Medications  Medication Sig Dispense Refill   ALPRAZolam (XANAX) 0.5 MG tablet Take 0.25-0.5 mg by mouth daily as needed for anxiety.      aspirin EC 81 MG tablet Take 81 mg by mouth daily. Swallow whole.     atorvastatin (LIPITOR) 80 MG tablet Take 1 tablet (80 mg total) by mouth daily. 90 tablet 3   bisacodyl (DULCOLAX) 5 MG EC tablet Take 1 tablet by mouth daily as needed for mild constipation (constipation).      Cholecalciferol (VITAMIN D) 50 MCG (2000 UT) CAPS Take 2,000 Units by mouth daily.     Cimetidine (TAGAMET PO) Take 1 tablet by mouth daily.     DULoxetine (CYMBALTA) 60 MG capsule Take 120 mg by mouth daily.     estradiol (ESTRACE) 1 MG tablet TAKE 1/2 TABLET BY MOUTH DAILY. 30 tablet 0   gabapentin (NEURONTIN) 100 MG capsule Take 100 mg by mouth 2 (two) times daily as needed (pain).     lamoTRIgine (LAMICTAL) 150 MG tablet Take 150 mg by mouth at bedtime.     levothyroxine (SYNTHROID) 25 MCG tablet Take 1 tablet (25 mcg total) by mouth daily. 90 tablet 1   losartan (COZAAR) 50 MG tablet TAKE (1) TABLET BY MOUTH ONCE DAILY. 90 tablet 3   meloxicam (MOBIC) 7.5 MG tablet TAKE 1 TABLET BY MOUTH TWICE DAILY AS NEEDED FOR PAIN. 180 tablet 0   Multiple Vitamin (MULTIVITAMIN) tablet Take 1  tablet by mouth daily.     niacin (SLO-NIACIN) 500 MG tablet Take 500 mg by mouth daily.     nitroGLYCERIN (NITROSTAT) 0.4 MG SL tablet PLACE 1 TAB UNDER TONGUE EVERY 5 MIN IF NEEDED FOR CHEST PAIN. MAY USE 3 TIMES.NO RELIEF CALL 911. 25 tablet 0   Omega-3 Fatty Acids (FISH OIL) 1000 MG CAPS Take 1,000 mg by mouth daily.     Probiotic Product (PROBIOTIC DAILY PO) Take 1 tablet by mouth daily.      risperiDONE (RISPERDAL) 1 MG tablet Take 0.5 mg by mouth at bedtime.     WELLBUTRIN XL 300 MG 24 hr tablet Take 300 mg by mouth daily.      No current  facility-administered medications for this visit.    Past Medical History:  Diagnosis Date   Anemia    Anxiety    Arthritis    BIPOLAR AFFECTIVE DISORDER 05/11/2009   Qualifier: Diagnosis of  By: Orville Govern, CMA, Carol     Bipolar affective disorder [facility])    Bipolar disorder ([place])    Cataract    Chest pain 05/11/2009   Qualifier: Diagnosis of  By: Orville Govern, CMA, Carol     Chronic back pain    Chronic cough 11/23/2014   Chronic fatigue    Chronic pain syndrome 0/27/2536   Complication of anesthesia    Constipation    Depression    Difficulty in walking(719.7) 07/26/2011   Dizziness    Dyslipidemia    Fecal incontinence 11/23/2014   Fibromyalgia    GERD (gastroesophageal reflux disease)    H/O anxiety disorder 03/05/2013   Overview:  Heart Cath x 2 per patient; most recent 4 yrs ago with "negative" findings per patient; Cardiologist, Dr Dorris Carnes following; last seen 10/2011; most recent episode 11/2012 with admit [facility]) and negative work-up per patient    H/O: hysterectomy    History of back surgery 03/05/2013   History of gastroesophageal reflux (GERD) 03/05/2013   History of kidney stones    Hx of adenomatous colonic polyps 2005   Hyperlipidemia    Hypertension    Hypothyroidism    Knee pain    Midsternal chest pain 08/19/2015   Muscle weakness (generalized) 04/27/2013   Non-obstructive CAD    a. 08/2015 Cath: LM 40, LCX small, 30ost, 60m OM2 small, RCA nl/small, EF 55-65%.   Osteopenia    PONV (postoperative nausea and vomiting)    Primary osteoarthritis of left knee 04/17/2020   Scalp laceration 03/01/2020   Sciatica     Past Surgical History:  Procedure Laterality Date   ABDOMINAL HYSTERECTOMY  1994   TAH- DUB, BSO   [place] bunionectomy     bilateral   BREAST BIOPSY Left 1989   BREAST LUMPECTOMY     BREAST SURGERY N/A    [employer] 09/14/2020   BUNIONECTOMY Left 2005   CARDIAC CATHETERIZATION  2010   +CAD   CARDIAC CATHETERIZATION N/A 08/19/2015    Procedure: Left Heart Cath and Coronary Angiography;  Surgeon: HBelva Crome MD;  [facility];  Service: Cardiovascular;  Laterality: N/A;   COLONOSCOPY  01/29/2007   RUYQ:IHKVQQrectum, left-sided diverticula, 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  Remainder of colonic mucosa was normal   COLONOSCOPY WITH ESOPHAGOGASTRODUODENOSCOPY (EGD) N/A 02/01/2014   RVZD:GLOVerosive reflux/gastric polyp/normal rectum/scattered left sided diverticula, normal distal TI. gastric bx negative, fundic gland polyp. next TCS 01/2019.   CYSTECTOMY     pilonidial cyst   ESOPHAGOGASTRODUODENOSCOPY  followed by colonoscopy with snare polypectomy   EYE SURGERY Bilateral    cataract surgery with lens implants   JOINT REPLACEMENT N/A    [employer] 09/14/2020   REVISION TOTAL HIP ARTHROPLASTY Right    right knee replacement  02/2013   SHOULDER ARTHROSCOPY Left 2005   SHOULDER ARTHROSCOPY Right    SPINAL FUSION     SPINAL FUSION  2004   L4-L5   SPINE SURGERY N/A    [employer] 09/14/2020   TOTAL HIP ARTHROPLASTY Right 06/2006   TOTAL HIP ARTHROPLASTY  8/12   TOTAL KNEE ARTHROPLASTY Left 04/18/2020   TOTAL KNEE ARTHROPLASTY Left 04/18/2020   Procedure: LEFT TOTAL KNEE ARTHROPLASTY;  Surgeon: Leandrew Koyanagi, MD;  Location: [place];  Service: Orthopedics;  Laterality: Left;   TOTAL SHOULDER REPLACEMENT Left 08/2005    Family History  Problem Relation Age of Onset   Heart disease Mother    Heart attack Mother    Heart disease Father    Heart attack Father    Stroke Sister    Heart disease Sister        CABG   Stomach cancer Maternal Aunt    Breast cancer Maternal Aunt    Stroke Maternal Grandfather    Cancer Paternal Grandmother        unknown primary   Colon cancer Neg Hx    Cervical cancer Neg Hx     Social History   Socioeconomic History   Marital status: Widowed    Spouse name: Passed in Jan 2013   Number of children: 2   Years of education: Not on file   Highest  education level: Master's degree (e.g., MA, MS, MEng, MEd, MSW, MBA)  Occupational History   Occupation: unemployed    Employer: RETIRED    Comment: retired Education officer, [employer]  Tobacco Use   Smoking status: Never   Smokeless tobacco: Never  Vaping Use   Vaping Use: Never used  Substance and Sexual Activity   Alcohol use: Not Currently    Alcohol/week: 5.0 standard drinks    Types: 5 Glasses of wine per week    Comment: last drink was 3 weeks ago.4-5 times a week, 5 unces each time   Drug use: No   Sexual activity: Not Currently    Partners: Male    Birth control/protection: Surgical    Comment: TAH  Other Topics Concern   Not on file  Social History Narrative   Lives alone   2 children, 9 grandchildren, 7 great grand   One in [place] and other one is in [place], children check on her.      Enjoy: reading;       Diet: eats all food groups    Caffeine: 2 cups of coffee, some tea   Water: 6-8 cups day       Wears seat belt    Does not use phone while driving   Smoke detectors at home   No weapons    Social Determinants of Health   Financial Resource Strain: Low Risk    Difficulty of Paying Living Expenses: Not hard at all  Food Insecurity: No Food Insecurity   Worried About Charity fundraiser in the Last Year: Never true   Arboriculturist in the Last Year: Never true  Transportation Needs: Unmet Transportation Needs   Lack of Transportation (Medical): Yes   Lack of Transportation (Non-Medical): Yes  Physical Activity: Insufficiently Active   Days of Exercise per Week: 5 days  Minutes of Exercise per Session: 10 min  Stress: No Stress Concern Present   Feeling of Stress : Only a little  Social Connections: Moderately Isolated   Frequency of Communication with Friends and Family: More than three times a week   Frequency of Social Gatherings with Friends and Family: Three times a week   Attends Religious Services: 1 to 4 times per year   Active Member of Clubs or  Organizations: No   Attends Archivist Meetings: Never   Marital Status: Separated  Intimate Partner Violence: Not At Risk   Fear of Current or Ex-Partner: No   Emotionally Abused: No   Physically Abused: No   Sexually Abused: No      ROS:  General: Negative for anorexia, weight loss, fever, chills, fatigue, weakness. Eyes: Negative for vision changes.  ENT: Negative for hoarseness, difficulty swallowing , nasal congestion. CV: Negative for chest pain, angina, palpitations, dyspnea on exertion, peripheral edema.  Respiratory: Negative for dyspnea at rest, dyspnea on exertion, cough, sputum, wheezing.  GI: See history of present illness. GU:  Negative for dysuria, hematuria, urinary incontinence, urinary frequency, nocturnal urination.  MS: Negative for joint pain, low back pain. Hip dislocation several months ago. Flexion restricted to 90 degrees but ok for colonoscopy per patient.  Derm: Negative for rash or itching.  Neuro: Negative for weakness, abnormal sensation, seizure, frequent headaches, memory loss, confusion.  Psych: Negative for suicidal ideation, hallucinations. Pos anxiety/depression Endo: Negative for unusual weight change.  Heme: Negative for bruising or bleeding. Allergy: Negative for rash or hives.   Observations/Objective:  Pleasant well nourished, well developed female in NAD. Answered questions appropriately. Cooperative. Good color. No SOB.    Lab Results  Component Value Date   CREATININE 0.93 02/14/2022   BUN 14 02/14/2022   NA 142 02/14/2022   K 4.4 02/14/2022   CL 100 02/14/2022   CO2 24 02/14/2022   Lab Results  Component Value Date   WBC 6.8 06/29/2021   HGB 13.2 06/29/2021   HCT 39.0 06/29/2021   MCV 96 06/29/2021   PLT 239 06/29/2021   Lab Results  Component Value Date   HGBA1C 5.7 (H) 02/14/2022    Assessment and Plan:  H/O colon polyps: last colonoscopy in 2015. Patient desires one last colonoscopy.   Chronic  constipation: recent episode of urgency/incontinence but typically only two good BMs per week. Recommend benefiber two teaspoons once to twice daily. Can add miralax one capful once to twice daily as needed. Call if regimen not effective.   GERD/Dysphagia: solid food dysphagia but also some problems with large pills and liquids. Typically heartburn doing ok on cimetidine. May have esophageal web/ring/stricture.    Colonoscopy/EGD+/-ED in near future. ASA 3.  I have discussed the risks, alternatives, benefits with regards to but not limited to the risk of reaction to medication, bleeding, infection, perforation and the patient is agreeable to proceed. Written consent to be obtained. Benefiber 2 teaspoons once to twice daily. Add miralax one capful once to twice daily as needed to maintain regular soft stools.  Continue cimetidine as before. Reinforced antireflux measures.  Stick with soft foods, finely chopped meats, chew food thoroughly.   Follow Up Instructions:    I discussed the assessment and treatment plan with the patient. The patient was provided an opportunity to ask questions and all were answered. The patient agreed with the plan and demonstrated an understanding of the instructions. AVS mailed to patient's home address.   The patient  was advised to call back or seek an in-person evaluation if the symptoms worsen or if the condition fails to improve as anticipated.  I provided 12 minutes of virtual face-to-face time during this encounter.   Neil Crouch, PA-C

## 2022-04-17 NOTE — Telephone Encounter (Signed)
AVS was mailed.

## 2022-04-17 NOTE — Telephone Encounter (Signed)
Patient completed virtual visit today. She needs colonoscopy/EGD/ED with Dr. Gala Romney. ASA 3. Dx: dysphagia, h/o colon polyps.   Please mail AVS and prep instructions to patient.

## 2022-05-01 ENCOUNTER — Other Ambulatory Visit: Payer: Self-pay | Admitting: Internal Medicine

## 2022-05-01 DIAGNOSIS — M8580 Other specified disorders of bone density and structure, unspecified site: Secondary | ICD-10-CM

## 2022-05-01 DIAGNOSIS — N951 Menopausal and female climacteric states: Secondary | ICD-10-CM

## 2022-05-03 ENCOUNTER — Ambulatory Visit: Payer: Medicare PPO | Admitting: Nurse Practitioner

## 2022-05-03 ENCOUNTER — Encounter: Payer: Self-pay | Admitting: Nurse Practitioner

## 2022-05-03 VITALS — BP 131/85 | HR 97 | Ht <= 58 in | Wt 115.0 lb

## 2022-05-03 DIAGNOSIS — E782 Mixed hyperlipidemia: Secondary | ICD-10-CM

## 2022-05-03 DIAGNOSIS — G8929 Other chronic pain: Secondary | ICD-10-CM | POA: Insufficient documentation

## 2022-05-03 DIAGNOSIS — I10 Essential (primary) hypertension: Secondary | ICD-10-CM | POA: Diagnosis not present

## 2022-05-03 DIAGNOSIS — M25511 Pain in right shoulder: Secondary | ICD-10-CM

## 2022-05-03 DIAGNOSIS — M79675 Pain in left toe(s): Secondary | ICD-10-CM | POA: Diagnosis not present

## 2022-05-03 MED ORDER — ATORVASTATIN CALCIUM 40 MG PO TABS: 40.0000 mg | ORAL_TABLET | Freq: Every day | ORAL | 3 refills | Status: AC

## 2022-05-03 NOTE — Assessment & Plan Note (Signed)
Patient referred to podiatry.

## 2022-05-03 NOTE — Assessment & Plan Note (Addendum)
The 10-year ASCVD risk score (Arnett DK, et al., 2019) is: 31%   Values used to calculate the score:     Age: 81 years     Sex: Female     Is Non-Hispanic African American: No     Diabetic: No     Tobacco smoker: No     Systolic Blood Pressure: 022 mmHg     Is BP treated: Yes     HDL Cholesterol: 85 mg/dL     Total Cholesterol: 183 mg/dL Lab Results  Component Value Date   CHOL 183 02/14/2022   HDL 85 02/14/2022   LDLCALC 89 02/14/2022   TRIG 47 02/14/2022   CHOLHDL 2.2 02/14/2022   .On atorvastatin 40 mg daily Patient does not want atorvastatin increased to 80 mg daily Continue current dose of atorvastatin 40 mg daily avoid fatty fried foods Patient encouraged to follow-up with cardiology when due

## 2022-05-03 NOTE — Assessment & Plan Note (Signed)
BP Readings from Last 3 Encounters:  05/03/22 131/85  02/14/22 133/82  10/31/21 134/78  Condition well-controlled on losartan 50 mg daily Continue current medication DASH diet advised engage in regular daily exercise and at least 150 minutes weekly as tolerated

## 2022-05-03 NOTE — Progress Notes (Signed)
[name] [name]     MRN: [id number]      DOB: 03-02-1941   HPI [name] [name] past medical history of hypertension, CAD, hypothyroidism, hyperlipidemia is here for follow up hyperlipidemia.  Atorvastatin was increased to 80 mg daily at her previous visit but patient stated that she has been taking atorvastatin 40 mg daily does not want an increased dosage of atorvastatin.  Patient denies calf pain, muscle pain  Has chronic Right shoulder pain, pain has been worse since the last 6 weeks. Has sharp aching pain worse with movement when she's trying to get dressed pain is 9/10 , denies numbness, tingling , takes meoxicam 7.5 mg every other day  and tylenol as needed  Has corn on her left foot, states that her foot hurts when she wears shoes, denies numbness, tingling     ROS Denies recent fever or chills. Denies sinus pressure, nasal congestion, ear pain or sore throat. Denies chest congestion, productive cough or wheezing. Denies chest pains, palpitations and leg swelling Denies abdominal pain, nausea, vomiting,diarrhea or constipation.   Denies dysuria, frequency, hesitancy or incontinence. Denies headaches, seizures, numbness, or tingling. Denies depression, anxiety or insomnia.    PE  BP 131/85 (BP Location: Right Arm, Patient Position: Sitting, Cuff Size: Normal)   Pulse 97   Ht '4\' 8"'$  (1.422 m)   Wt 115 lb (52.2 kg)   LMP 02/10/1993   SpO2 95%   BMI 25.78 kg/m   Patient alert and oriented and in no cardiopulmonary distress.  HEENT: No facial asymmetry, EOMI,     Neck supple .  Chest: Clear to auscultation bilaterally.  CVS: S1, S2 no murmurs, no S3.Regular rate.  ABD: Soft non tender.   Ext: No edema  MS: Adequate ROM spine, shoulders, hips and knees . Has tenderness on range of motion of right shoulder, tenderness on palpation of left 4th toe.   Skin: Intact, no ulcerations or rash noted.  Psych: Good eye contact, normal affect. Memory intact not anxious or  depressed appearing.   Assessment & Plan  Hyperlipidemia The 10-year ASCVD risk score (Arnett DK, et al., 2019) is: 31%   Values used to calculate the score:     Age: 81 years     Sex: Female     Is Non-Hispanic African American: No     Diabetic: No     Tobacco smoker: No     Systolic Blood Pressure: 009 mmHg     Is BP treated: Yes     HDL Cholesterol: 85 mg/dL     Total Cholesterol: 183 mg/dL Lab Results  Component Value Date   CHOL 183 02/14/2022   HDL 85 02/14/2022   LDLCALC 89 02/14/2022   TRIG 47 02/14/2022   CHOLHDL 2.2 02/14/2022   .On atorvastatin 40 mg daily Patient does not want atorvastatin increased to 80 mg daily Continue current dose of atorvastatin 40 mg daily avoid fatty fried foods Patient encouraged to follow-up with cardiology when due  HTN (hypertension) BP Readings from Last 3 Encounters:  05/03/22 131/85  02/14/22 133/82  10/31/21 134/78  Condition well-controlled on losartan 50 mg daily Continue current medication DASH diet advised engage in regular daily exercise and at least 150 minutes weekly as tolerated  Toe pain, chronic, left Patient referred to podiatry  Chronic right shoulder pain Chronic condition Has diclofenac 7.5 mg twice daily as needed ordered but she has been taking medication every other day as needed Patient encouraged to take diclofenac 7.5 mg  twice daily as needed Follow-up with orthopedics

## 2022-05-03 NOTE — Assessment & Plan Note (Signed)
Chronic condition Has diclofenac 7.5 mg twice daily as needed ordered but she has been taking medication every other day as needed Patient encouraged to take diclofenac 7.5 mg twice daily as needed Follow-up with orthopedics

## 2022-05-03 NOTE — Patient Instructions (Addendum)
Please take meloxicam 7.'5mg'$  twice daily as needed for your shoulder pain.   Wear comfortable shoes always to help with your toe pain   It is important that you exercise regularly at least 30 minutes 5 times a week.  Think about what you will eat, plan ahead. Choose " clean, green, fresh or frozen" over canned, processed or packaged foods which are more sugary, salty and fatty. 70 to 75% of food eaten should be vegetables and fruit. Three meals at set times with snacks allowed between meals, but they must be fruit or vegetables. Aim to eat over a 12 hour period , example 7 am to 7 pm, and STOP after  your last meal of the day. Drink water,generally about 64 ounces per day, no other drink is as healthy. Fruit juice is best enjoyed in a healthy way, by EATING the fruit.  Thanks for choosing [facility], we consider it a privelige to serve you.

## 2022-05-04 LAB — CALCIUM: Calcium: 9.9 mg/dL (ref 8.7–10.3)

## 2022-05-08 DIAGNOSIS — F3131 Bipolar disorder, current episode depressed, mild: Secondary | ICD-10-CM | POA: Diagnosis not present

## 2022-05-10 ENCOUNTER — Telehealth: Payer: Self-pay | Admitting: *Deleted

## 2022-05-10 ENCOUNTER — Encounter: Payer: Self-pay | Admitting: *Deleted

## 2022-05-10 MED ORDER — PEG 3350-KCL-NA BICARB-NACL 420 G PO SOLR: ORAL | 0 refills | Status: DC

## 2022-05-10 NOTE — Telephone Encounter (Signed)
Pt returned call. Scheduled for 8/2 at 10:30am. Aware will mail instructions/pre-op appt. Rx sent to pharmacy   PA approved via [employer] website for TCS: auth# [id number], DOS: 06/13/2022 - 09/11/2022  PA approved for EGD. Auth# [id number], DOS: 06/13/22-09/11/22

## 2022-05-10 NOTE — Telephone Encounter (Signed)
Called both #'s listed, no answer and no VM. Need to schedule TCS/EGD/ED with Dr. Gala Romney, ASA 3

## 2022-05-28 ENCOUNTER — Encounter: Payer: Self-pay | Admitting: *Deleted

## 2022-05-28 ENCOUNTER — Telehealth: Payer: Self-pay | Admitting: *Deleted

## 2022-05-28 MED ORDER — PEG 3350-KCL-NA BICARB-NACL 420 G PO SOLR: ORAL | 0 refills | Status: AC

## 2022-05-28 NOTE — Telephone Encounter (Signed)
Pt called in and needs to reschedule her procedure. She has moved to 8/25 at [place] will mail new instructions/pre-op. She has not picked prep up. New rx sent  PA approved via [employer] website for TCS: auth# [id number], DOS: 06/13/2022 - 09/11/2022   PA approved for EGD. Auth# [id number], DOS: 06/13/22-09/11/22

## 2022-06-04 ENCOUNTER — Other Ambulatory Visit: Payer: Self-pay | Admitting: Internal Medicine

## 2022-06-08 ENCOUNTER — Other Ambulatory Visit (HOSPITAL_COMMUNITY): Payer: Medicare PPO

## 2022-06-22 ENCOUNTER — Telehealth: Payer: Self-pay | Admitting: *Deleted

## 2022-06-22 NOTE — Telephone Encounter (Signed)
Patient called wanting to reschedule pre-op appt on 07/03/22. Pt was given number [phone] to call and reschedule appointment

## 2022-06-26 ENCOUNTER — Ambulatory Visit: Payer: Medicare PPO | Admitting: Nurse Practitioner

## 2022-06-26 ENCOUNTER — Encounter: Payer: Self-pay | Admitting: Nurse Practitioner

## 2022-06-26 VITALS — BP 126/80 | HR 98 | Ht <= 58 in | Wt 113.0 lb

## 2022-06-26 DIAGNOSIS — G8929 Other chronic pain: Secondary | ICD-10-CM | POA: Diagnosis not present

## 2022-06-26 DIAGNOSIS — E039 Hypothyroidism, unspecified: Secondary | ICD-10-CM

## 2022-06-26 DIAGNOSIS — R5382 Chronic fatigue, unspecified: Secondary | ICD-10-CM | POA: Diagnosis not present

## 2022-06-26 DIAGNOSIS — E538 Deficiency of other specified B group vitamins: Secondary | ICD-10-CM | POA: Diagnosis not present

## 2022-06-26 DIAGNOSIS — N951 Menopausal and female climacteric states: Secondary | ICD-10-CM | POA: Diagnosis not present

## 2022-06-26 DIAGNOSIS — M25511 Pain in right shoulder: Secondary | ICD-10-CM | POA: Diagnosis not present

## 2022-06-26 DIAGNOSIS — I2510 Atherosclerotic heart disease of native coronary artery without angina pectoris: Secondary | ICD-10-CM

## 2022-06-26 DIAGNOSIS — I10 Essential (primary) hypertension: Secondary | ICD-10-CM

## 2022-06-26 DIAGNOSIS — E782 Mixed hyperlipidemia: Secondary | ICD-10-CM | POA: Diagnosis not present

## 2022-06-26 NOTE — Assessment & Plan Note (Signed)
Has history of B12 deficiency Check vitamin B12

## 2022-06-26 NOTE — Assessment & Plan Note (Addendum)
BP Readings from Last 3 Encounters:  06/26/22 126/80  05/03/22 131/85  02/14/22 133/82  Chronic condition well-controlled on losartan 50 mg daily Continue current medication DASH diet advised engage in regular daily walking exercises at least 150 minutes weekly as tolerated bmp today

## 2022-06-26 NOTE — Assessment & Plan Note (Signed)
Lab Results  Component Value Date   CHOL 183 02/14/2022   HDL 85 02/14/2022   LDLCALC 89 02/14/2022   TRIG 47 02/14/2022   CHOLHDL 2.2 02/14/2022  Currently on atorvastatin 40 mg daily Continue current medications Check fasting lipid panel

## 2022-06-26 NOTE — Assessment & Plan Note (Signed)
Chronic condition currently on atorvastatin 40 mg daily, aspirin 81 mg daily Currently denies chest pain

## 2022-06-26 NOTE — Assessment & Plan Note (Signed)
Chronic condition on diclofenac 7.5 mg daily as needed Continue current medication Patient encouraged to follow-up with orthopedics

## 2022-06-26 NOTE — Assessment & Plan Note (Signed)
On levothyroxine 25 mcg daily Check TSH T4

## 2022-06-26 NOTE — Assessment & Plan Note (Addendum)
Chronic condition check B12 level, CBC Engage in regular daily walking exercises as tolerated

## 2022-06-26 NOTE — Progress Notes (Signed)
   [name] [name]     MRN: [id number]      DOB: April 23, 1941   HPI [name] [name] with past medical history of hypertension, hyperlipidemia, hypothyroidism, CAD chronic right shoulder pain ,is here for follow up and re-evaluation of chronic medical conditions, medication management and review of any available recent lab and radiology data.   Hypertension currently on losartan 50 mg daily she denies dizziness, chest pain, edema  Hyperlipidemia.  Currently on atorvastatin 40 mg daily, omega 3 fatty acid 1000 mg daily.  Denies muscle aches   Chronic right shoulder pain for the past couple of months. Takes meloxicam 7.'5mg'$  daily pain is currently 6/10, has limited rage of motion of right shoulder, has palpable radial pulses.    Hypothyroidism .currently on levothyroxine 25 mcg tablet 1 tablet daily feels fatigued and has  low energy     ROS Denies recent fever or chills. Denies sinus pressure, nasal congestion, ear pain or sore throat. Denies chest congestion, productive cough or wheezing. Denies chest pains, palpitations and leg swelling Denies abdominal pain, nausea, vomiting,diarrhea or constipation.   Denies dysuria, frequency, hesitancy or incontinence. Denies headaches, seizures, numbness, or tingling. Denies depression, anxiety or insomnia.    PE  BP 126/80 (BP Location: Right Arm, Patient Position: Sitting, Cuff Size: Normal)   Pulse 98   Ht '4\' 8"'$  (1.422 m)   Wt 113 lb (51.3 kg)   LMP 02/10/1993   SpO2 92%   BMI 25.33 kg/m   Patient alert and oriented and in no cardiopulmonary distress.  Chest: Clear to auscultation bilaterally.  CVS: S1, S2 no murmurs, no S3.Regular rate.  ABD: Soft non tender.   Ext: No edema  MS: Decreased  ROM spine, shoulders, hips and knees..has limited rage of motion of right shoulder, has palpable radial pulses.,  Using a cane for ambulation  Skin: Intact, no ulcerations or rash noted.  Psych: Good eye contact, normal affect. Memory intact  not anxious or depressed appearing.  CNS: CN 2-12 intact, power,  normal throughout.no focal deficits noted.   Assessment & Plan  HTN (hypertension) BP Readings from Last 3 Encounters:  06/26/22 126/80  05/03/22 131/85  02/14/22 133/82  Chronic condition well-controlled on losartan 50 mg daily Continue current medication DASH diet advised engage in regular daily walking exercises at least 150 minutes weekly as tolerated bmp today   Hypothyroidism On levothyroxine 25 mcg daily Check TSH T4  Hyperlipidemia Lab Results  Component Value Date   CHOL 183 02/14/2022   HDL 85 02/14/2022   LDLCALC 89 02/14/2022   TRIG 47 02/14/2022   CHOLHDL 2.2 02/14/2022  Currently on atorvastatin 40 mg daily Continue current medications Check fasting lipid panel  Chronic right shoulder pain Chronic condition on diclofenac 7.5 mg daily as needed Continue current medication Patient encouraged to follow-up with orthopedics  Coronary artery disease Chronic condition currently on atorvastatin 40 mg daily, aspirin 81 mg daily Currently denies chest pain  Vitamin B 12 deficiency Has history of B12 deficiency Check vitamin B12  Vaginal dryness, menopausal Currently on estradiol 0.5 mg, states that she was seeing a gynecologist in the past but not currently, risk of breast cancer , uterine cancer with use  of estradiol discussed. Patient referred to OB/GYN for management of estradiol  Chronic fatigue Chronic condition check B12 level, CBC Engage in regular daily walking exercises as tolerated

## 2022-06-26 NOTE — Assessment & Plan Note (Addendum)
Currently on estradiol 0.5 mg, states that she was seeing a gynecologist in the past but not currently, risk of breast cancer , uterine cancer with use  of estradiol discussed. Patient referred to OB/GYN for management of estradiol

## 2022-06-26 NOTE — Patient Instructions (Addendum)
Please continue meloxicam 7.'5mg'$  daily as ordered, alternate with tylenol '650mg'$  every 6 hours as needed    It is important that you exercise regularly at least 30 minutes 5 times a weekly as tolerated  Think about what you will eat, plan ahead. Choose " clean, green, fresh or frozen" over canned, processed or packaged foods which are more sugary, salty and fatty. 70 to 75% of food eaten should be vegetables and fruit. Three meals at set times with snacks allowed between meals, but they must be fruit or vegetables. Aim to eat over a 12 hour period , example 7 am to 7 pm, and STOP after  your last meal of the day. Drink water,generally about 64 ounces per day, no other drink is as healthy. Fruit juice is best enjoyed in a healthy way, by EATING the fruit.  Thanks for choosing [facility], we consider it a privelige to serve you.

## 2022-06-27 ENCOUNTER — Other Ambulatory Visit: Payer: Self-pay | Admitting: Internal Medicine

## 2022-06-27 ENCOUNTER — Telehealth: Payer: Self-pay

## 2022-06-27 ENCOUNTER — Other Ambulatory Visit: Payer: Self-pay | Admitting: Orthopaedic Surgery

## 2022-06-27 DIAGNOSIS — N951 Menopausal and female climacteric states: Secondary | ICD-10-CM

## 2022-06-27 DIAGNOSIS — M8580 Other specified disorders of bone density and structure, unspecified site: Secondary | ICD-10-CM

## 2022-06-27 LAB — CBC
Hematocrit: 39.5 % (ref 34.0–46.6)
Hemoglobin: 13.7 g/dL (ref 11.1–15.9)
MCH: 33.2 pg — ABNORMAL HIGH (ref 26.6–33.0)
MCHC: 34.7 g/dL (ref 31.5–35.7)
MCV: 96 fL (ref 79–97)
Platelets: 235 10*3/uL (ref 150–450)
RBC: 4.13 x10E6/uL (ref 3.77–5.28)
RDW: 12 % (ref 11.7–15.4)
WBC: 5.9 10*3/uL (ref 3.4–10.8)

## 2022-06-27 LAB — BASIC METABOLIC PANEL
BUN/Creatinine Ratio: 17 (ref 12–28)
BUN: 12 mg/dL (ref 8–27)
CO2: 22 mmol/L (ref 20–29)
Calcium: 9.6 mg/dL (ref 8.7–10.3)
Chloride: 101 mmol/L (ref 96–106)
Creatinine, Ser: 0.7 mg/dL (ref 0.57–1.00)
Glucose: 104 mg/dL — ABNORMAL HIGH (ref 70–99)
Potassium: 4.7 mmol/L (ref 3.5–5.2)
Sodium: 139 mmol/L (ref 134–144)
eGFR: 88 mL/min/{1.73_m2} (ref >59)

## 2022-06-27 LAB — LIPID PANEL
Chol/HDL Ratio: 2.3 ratio (ref 0.0–4.4)
Cholesterol, Total: 173 mg/dL (ref 100–199)
HDL: 74 mg/dL (ref >39)
LDL Chol Calc (NIH): 84 mg/dL (ref 0–99)
Triglycerides: 84 mg/dL (ref 0–149)
VLDL Cholesterol Cal: 15 mg/dL (ref 5–40)

## 2022-06-27 LAB — VITAMIN B12: Vitamin B-12: 1044 pg/mL (ref 232–1245)

## 2022-06-27 LAB — TSH+FREE T4
Free T4: 1.08 ng/dL (ref 0.82–1.77)
TSH: 2.24 u[IU]/mL (ref 0.450–4.500)

## 2022-06-27 NOTE — Telephone Encounter (Signed)
Called pt no answer left vm 

## 2022-06-27 NOTE — Telephone Encounter (Signed)
Patient returning lab results call she missed

## 2022-06-27 NOTE — Progress Notes (Signed)
LDL slightly above goal of less than 70, but since the pt does not want a higher dose of atorvastatin she should continue current medications , avoid fatty fried foods.  Other labs are normal      he 10-year ASCVD risk score (Arnett DK, et al., 2019) is: 28.6%   Values used to calculate the score:     Age: 81 years     Sex: Female     Is Non-Hispanic African American: No     Diabetic: No     Tobacco smoker: No     Systolic Blood Pressure: 949 mmHg     Is BP treated: Yes     HDL Cholesterol: 74 mg/dL     Total Cholesterol: 173 mg/dL

## 2022-06-29 NOTE — Telephone Encounter (Signed)
Spoke with pt

## 2022-06-29 NOTE — Patient Instructions (Signed)
[name] [name]  06/29/2022     '@PREFPERIOPPHARMACY'$ @   Your procedure is scheduled on  07/06/2022.   Report to [facility] at  0800  A.M.   Call this number if you have problems the morning of surgery:  [phone]   Remember:  Follow the diet and prep instructions given to you by the office.    Take these medicines the morning of surgery with A SIP OF WATER         tagamet, cymbalta, neurontin, synthroid, mobic(if needed), wellbutrin.     Do not wear jewelry, make-up or nail polish.  Do not wear lotions, powders, or perfumes, or deodorant.  Do not shave 48 hours prior to surgery.  Men may shave face and neck.  Do not bring valuables to the hospital.  [facility] is not responsible for any belongings or valuables.  Contacts, dentures or bridgework may not be worn into surgery.  Leave your suitcase in the car.  After surgery it may be brought to your room.  For patients admitted to the hospital, discharge time will be determined by your treatment team.  Patients discharged the day of surgery will not be allowed to drive home and must have someone with them for 24 hours.    Special instructions:   DO NOT smoke tobacco or vape for 24 hours before your procedure.   Please read over the following fact sheets that you were given. Anesthesia Post-op Instructions and Care and Recovery After Surgery      Upper Endoscopy, Adult, Care After After the procedure, it is common to have a sore throat. It is also common to have: Mild stomach pain or discomfort. Bloating. Nausea. Follow these instructions at home: The instructions below may help you care for yourself at home. Your health care provider may give you more instructions. If you have questions, ask your health care provider. If you were given a sedative during the procedure, it can affect you for several hours. Do not drive or operate machinery until your health care provider says that it is safe. If you will  be going home right after the procedure, plan to have a responsible adult: Take you home from the hospital or clinic. You will not be allowed to drive. Care for you for the time you are told. Follow instructions from your health care provider about what you may eat and drink. Return to your normal activities as told by your health care provider. Ask your health care provider what activities are safe for you. Take over-the-counter and prescription medicines only as told by your health care provider. Contact a health care provider if you: Have a sore throat that lasts longer than one day. Have trouble swallowing. Have a fever. Get help right away if you: Vomit blood or your vomit looks like coffee grounds. Have bloody, black, or tarry stools. Have a very bad sore throat or you cannot swallow. Have difficulty breathing or very bad pain in your chest or abdomen. These symptoms may be an emergency. Get help right away. Call 911. Do not wait to see if the symptoms will go away. Do not drive yourself to the hospital. Summary After the procedure, it is common to have a sore throat, mild stomach discomfort, bloating, and nausea. If you were given a sedative during the procedure, it can affect you for several hours. Do not drive until your health care provider says that it is safe. Follow  instructions from your health care provider about what you may eat and drink. Return to your normal activities as told by your health care provider. This information is not intended to replace advice given to you by your health care provider. Make sure you discuss any questions you have with your health care provider. Document Revised: 02/07/2022 Document Reviewed: 02/07/2022 Elsevier Patient Education  [place]. Esophageal Dilatation Esophageal dilatation, also called esophageal dilation, is a procedure to widen or open a blocked or narrowed part of the esophagus. The esophagus is the part of the body  that moves food and liquid from the mouth to the stomach. You may need this procedure if: You have a buildup of scar tissue in your esophagus that makes it difficult, painful, or impossible to swallow. This can be caused by gastroesophageal reflux disease (GERD). You have cancer of the esophagus. There is a problem with how food moves through your esophagus. In some cases, you may need this procedure repeated at a later time to dilate the esophagus gradually. Tell a health care provider about: Any allergies you have. All medicines you are taking, including vitamins, herbs, eye drops, creams, and over-the-counter medicines. Any problems you or family members have had with anesthetic medicines. Any blood disorders you have. Any surgeries you have had. Any medical conditions you have. Any antibiotic medicines you are required to take before dental procedures. Whether you are pregnant or may be pregnant. What are the risks? Generally, this is a safe procedure. However, problems may occur, including: Bleeding due to a tear in the lining of the esophagus. A hole, or perforation, in the esophagus. What happens before the procedure? Ask your health care provider about: Changing or stopping your regular medicines. This is especially important if you are taking diabetes medicines or blood thinners. Taking medicines such as aspirin and ibuprofen. These medicines can thin your blood. Do not take these medicines unless your health care provider tells you to take them. Taking over-the-counter medicines, vitamins, herbs, and supplements. Follow instructions from your health care provider about eating or drinking restrictions. Plan to have a responsible adult take you home from the hospital or clinic. Plan to have a responsible adult care for you for the time you are told after you leave the hospital or clinic. This is important. What happens during the procedure? You may be given a medicine to help you  relax (sedative). A numbing medicine may be sprayed into the back of your throat, or you may gargle the medicine. Your health care provider may perform the dilatation using various surgical instruments, such as: Simple dilators. This instrument is carefully placed in the esophagus to stretch it. Guided wire bougies. This involves using an endoscope to insert a wire into the esophagus. A dilator is passed over this wire to enlarge the esophagus. Then the wire is removed. Balloon dilators. An endoscope with a small balloon is inserted into the esophagus. The balloon is inflated to stretch the esophagus and open it up. The procedure may vary among health care providers and hospitals. What can I expect after the procedure? Your blood pressure, heart rate, breathing rate, and blood oxygen level will be monitored until you leave the hospital or clinic. Your throat may feel slightly sore and numb. This will get better over time. You will not be allowed to eat or drink until your throat is no longer numb. When you are able to drink, urinate, and sit on the edge of the bed without nausea  or dizziness, you may be able to return home. Follow these instructions at home: Take over-the-counter and prescription medicines only as told by your health care provider. If you were given a sedative during the procedure, it can affect you for several hours. Do not drive or operate machinery until your health care provider says that it is safe. Plan to have a responsible adult care for you for the time you are told. This is important. Follow instructions from your health care provider about any eating or drinking restrictions. Do not use any products that contain nicotine or tobacco, such as cigarettes, e-cigarettes, and chewing tobacco. If you need help quitting, ask your health care provider. Keep all follow-up visits. This is important. Contact a health care provider if: You have a fever. You have pain that is not  relieved by medicine. Get help right away if: You have chest pain. You have trouble breathing. You have trouble swallowing. You vomit blood. You have black, tarry, or bloody stools. These symptoms may represent a serious problem that is an emergency. Do not wait to see if the symptoms will go away. Get medical help right away. Call your local emergency services (911 in the [place] Do not drive yourself to the hospital. Summary Esophageal dilatation, also called esophageal dilation, is a procedure to widen or open a blocked or narrowed part of the esophagus. Plan to have a responsible adult take you home from the hospital or clinic. For this procedure, a numbing medicine may be sprayed into the back of your throat, or you may gargle the medicine. Do not drive or operate machinery until your health care provider says that it is safe. This information is not intended to replace advice given to you by your health care provider. Make sure you discuss any questions you have with your health care provider. Document Revised: 03/16/2020 Document Reviewed: 03/16/2020 Elsevier Patient Education  [place]. Colonoscopy, Adult, Care After The following information offers guidance on how to care for yourself after your procedure. Your health care provider may also give you more specific instructions. If you have problems or questions, contact your health care provider. What can I expect after the procedure? After the procedure, it is common to have: A small amount of blood in your stool for 24 hours after the procedure. Some gas. Mild cramping or bloating of your abdomen. Follow these instructions at home: Eating and drinking  Drink enough fluid to keep your urine pale yellow. Follow instructions from your health care provider about eating or drinking restrictions. Resume your normal diet as told by your health care provider. Avoid heavy or fried foods that are hard to digest. Activity Rest  as told by your health care provider. Avoid sitting for a long time without moving. Get up to take short walks every 1-2 hours. This is important to improve blood flow and breathing. Ask for help if you feel weak or unsteady. Return to your normal activities as told by your health care provider. Ask your health care provider what activities are safe for you. Managing cramping and bloating  Try walking around when you have cramps or feel bloated. If directed, apply heat to your abdomen as told by your health care provider. Use the heat source that your health care provider recommends, such as a moist heat pack or a heating pad. Place a towel between your skin and the heat source. Leave the heat on for 20-30 minutes. Remove the heat if your skin turns bright  red. This is especially important if you are unable to feel pain, heat, or cold. You have a greater risk of getting burned. General instructions If you were given a sedative during the procedure, it can affect you for several hours. Do not drive or operate machinery until your health care provider says that it is safe. For the first 24 hours after the procedure: Do not sign important documents. Do not drink alcohol. Do your regular daily activities at a slower pace than normal. Eat soft foods that are easy to digest. Take over-the-counter and prescription medicines only as told by your health care provider. Keep all follow-up visits. This is important. Contact a health care provider if: You have blood in your stool 2-3 days after the procedure. Get help right away if: You have more than a small spotting of blood in your stool. You have large blood clots in your stool. You have swelling of your abdomen. You have nausea or vomiting. You have a fever. You have increasing pain in your abdomen that is not relieved with medicine. These symptoms may be an emergency. Get help right away. Call 911. Do not wait to see if the symptoms will go  away. Do not drive yourself to the hospital. Summary After the procedure, it is common to have a small amount of blood in your stool. You may also have mild cramping and bloating of your abdomen. If you were given a sedative during the procedure, it can affect you for several hours. Do not drive or operate machinery until your health care provider says that it is safe. Get help right away if you have a lot of blood in your stool, nausea or vomiting, a fever, or increased pain in your abdomen. This information is not intended to replace advice given to you by your health care provider. Make sure you discuss any questions you have with your health care provider. Document Revised: 06/21/2021 Document Reviewed: 06/21/2021 Elsevier Patient Education  [place] After This sheet gives you information about how to care for yourself after your procedure. Your health care provider may also give you more specific instructions. If you have problems or questions, contact your health care provider. What can I expect after the procedure? After the procedure, it is common to have: Tiredness. Forgetfulness about what happened after the procedure. Impaired judgment for important decisions. Nausea or vomiting. Some difficulty with balance. Follow these instructions at home: For the time period you were told by your health care provider:     Rest as needed. Do not participate in activities where you could fall or become injured. Do not drive or use machinery. Do not drink alcohol. Do not take sleeping pills or medicines that cause drowsiness. Do not make important decisions or sign legal documents. Do not take care of children on your own. Eating and drinking Follow the diet that is recommended by your health care provider. Drink enough fluid to keep your urine pale yellow. If you vomit: Drink water, juice, or soup when you can drink without vomiting. Make  sure you have little or no nausea before eating solid foods. General instructions Have a responsible adult stay with you for the time you are told. It is important to have someone help care for you until you are awake and alert. Take over-the-counter and prescription medicines only as told by your health care provider. If you have sleep apnea, surgery and certain medicines can increase your risk for breathing  problems. Follow instructions from your health care provider about wearing your sleep device: Anytime you are sleeping, including during daytime naps. While taking prescription pain medicines, sleeping medicines, or medicines that make you drowsy. Avoid smoking. Keep all follow-up visits as told by your health care provider. This is important. Contact a health care provider if: You keep feeling nauseous or you keep vomiting. You feel light-headed. You are still sleepy or having trouble with balance after 24 hours. You develop a rash. You have a fever. You have redness or swelling around the IV site. Get help right away if: You have trouble breathing. You have new-onset confusion at home. Summary For several hours after your procedure, you may feel tired. You may also be forgetful and have poor judgment. Have a responsible adult stay with you for the time you are told. It is important to have someone help care for you until you are awake and alert. Rest as told. Do not drive or operate machinery. Do not drink alcohol or take sleeping pills. Get help right away if you have trouble breathing, or if you suddenly become confused. This information is not intended to replace advice given to you by your health care provider. Make sure you discuss any questions you have with your health care provider. Document Revised: 10/03/2021 Document Reviewed: 10/01/2019 Elsevier Patient Education  [place].

## 2022-07-03 ENCOUNTER — Encounter (HOSPITAL_COMMUNITY)
Admission: RE | Admit: 2022-07-03 | Discharge: 2022-07-03 | Disposition: A | Discharge status: Home / Self Care | Payer: Medicare PPO | Source: Ambulatory Visit | Attending: Internal Medicine | Admitting: Internal Medicine

## 2022-07-03 ENCOUNTER — Encounter (HOSPITAL_COMMUNITY): Payer: Medicare PPO

## 2022-07-03 DIAGNOSIS — Z01810 Encounter for preprocedural cardiovascular examination: Secondary | ICD-10-CM | POA: Diagnosis not present

## 2022-07-03 DIAGNOSIS — I10 Essential (primary) hypertension: Secondary | ICD-10-CM | POA: Insufficient documentation

## 2022-07-06 ENCOUNTER — Ambulatory Visit (HOSPITAL_COMMUNITY): Payer: Medicare PPO | Admitting: Anesthesiology

## 2022-07-06 ENCOUNTER — Encounter (HOSPITAL_COMMUNITY): Payer: Self-pay | Admitting: Internal Medicine

## 2022-07-06 ENCOUNTER — Ambulatory Visit (HOSPITAL_COMMUNITY)
Admission: RE | Admit: 2022-07-06 | Discharge: 2022-07-06 | Disposition: A | Discharge status: Home / Self Care | Payer: Medicare PPO | Attending: Internal Medicine | Admitting: Internal Medicine

## 2022-07-06 ENCOUNTER — Telehealth: Payer: Self-pay

## 2022-07-06 ENCOUNTER — Other Ambulatory Visit: Payer: Self-pay

## 2022-07-06 ENCOUNTER — Encounter (HOSPITAL_COMMUNITY)
Admission: RE | Disposition: A | Discharge status: Home / Self Care | Payer: Self-pay | Source: Home / Self Care | Attending: Internal Medicine

## 2022-07-06 ENCOUNTER — Ambulatory Visit (HOSPITAL_BASED_OUTPATIENT_CLINIC_OR_DEPARTMENT_OTHER): Payer: Medicare PPO | Admitting: Anesthesiology

## 2022-07-06 DIAGNOSIS — R1310 Dysphagia, unspecified: Secondary | ICD-10-CM | POA: Diagnosis not present

## 2022-07-06 DIAGNOSIS — Z1211 Encounter for screening for malignant neoplasm of colon: Secondary | ICD-10-CM

## 2022-07-06 DIAGNOSIS — E039 Hypothyroidism, unspecified: Secondary | ICD-10-CM | POA: Diagnosis not present

## 2022-07-06 DIAGNOSIS — K449 Diaphragmatic hernia without obstruction or gangrene: Secondary | ICD-10-CM | POA: Diagnosis not present

## 2022-07-06 DIAGNOSIS — K222 Esophageal obstruction: Secondary | ICD-10-CM | POA: Diagnosis not present

## 2022-07-06 DIAGNOSIS — Q438 Other specified congenital malformations of intestine: Secondary | ICD-10-CM | POA: Insufficient documentation

## 2022-07-06 DIAGNOSIS — K2100 Gastro-esophageal reflux disease with esophagitis, without bleeding: Secondary | ICD-10-CM | POA: Insufficient documentation

## 2022-07-06 DIAGNOSIS — K319 Disease of stomach and duodenum, unspecified: Secondary | ICD-10-CM | POA: Diagnosis not present

## 2022-07-06 DIAGNOSIS — K5730 Diverticulosis of large intestine without perforation or abscess without bleeding: Secondary | ICD-10-CM | POA: Diagnosis not present

## 2022-07-06 DIAGNOSIS — K6289 Other specified diseases of anus and rectum: Secondary | ICD-10-CM | POA: Diagnosis not present

## 2022-07-06 HISTORY — PX: BIOPSY: SHX5522

## 2022-07-06 HISTORY — PX: ESOPHAGOGASTRODUODENOSCOPY (EGD) WITH PROPOFOL: SHX5813

## 2022-07-06 HISTORY — PX: MALONEY DILATION: SHX5535

## 2022-07-06 HISTORY — PX: COLONOSCOPY WITH PROPOFOL: SHX5780

## 2022-07-06 SURGERY — COLONOSCOPY WITH PROPOFOL
Anesthesia: Monitor Anesthesia Care

## 2022-07-06 MED ORDER — PROPOFOL 500 MG/50ML IV EMUL
INTRAVENOUS | Status: DC | PRN
  Administered 2022-07-06: 150 ug/kg/min via INTRAVENOUS

## 2022-07-06 MED ORDER — LACTATED RINGERS IV SOLN: INTRAVENOUS | Status: DC

## 2022-07-06 MED ORDER — LIDOCAINE HCL (CARDIAC) PF 100 MG/5ML IV SOSY
PREFILLED_SYRINGE | INTRAVENOUS | Status: DC | PRN
  Administered 2022-07-06: 50 mg via INTRAVENOUS

## 2022-07-06 MED ORDER — PROPOFOL 500 MG/50ML IV EMUL
INTRAVENOUS | Status: AC
  Filled 2022-07-06: qty 50

## 2022-07-06 MED ORDER — DIPHENHYDRAMINE HCL 50 MG/ML IJ SOLN
INTRAMUSCULAR | Status: AC
  Filled 2022-07-06: qty 1

## 2022-07-06 MED ORDER — PANTOPRAZOLE SODIUM 40 MG PO TBEC
40.0000 mg | DELAYED_RELEASE_TABLET | Freq: Every day | ORAL | 11 refills | Status: AC
Start: 2022-07-06 — End: ?

## 2022-07-06 MED ORDER — LIDOCAINE HCL (PF) 2 % IJ SOLN
INTRAMUSCULAR | Status: AC
  Filled 2022-07-06: qty 5

## 2022-07-06 NOTE — Anesthesia Preprocedure Evaluation (Signed)
Anesthesia Evaluation  Patient identified by MRN, date of birth, ID band Patient awake    Reviewed: Allergy & Precautions, H&P , NPO status , Patient's Chart, lab work & pertinent test results, reviewed documented beta blocker date and time   History of Anesthesia Complications (+) PONV and history of anesthetic complications  Airway Mallampati: II  TM Distance: >3 FB Neck ROM: full    Dental no notable dental hx.    Pulmonary neg pulmonary ROS,    Pulmonary exam normal breath sounds clear to auscultation       Cardiovascular Exercise Tolerance: Good hypertension, + CAD   Rhythm:regular Rate:Normal     Neuro/Psych PSYCHIATRIC DISORDERS Anxiety Depression Bipolar Disorder  Neuromuscular disease    GI/Hepatic Neg liver ROS, GERD  Medicated,  Endo/Other  Hypothyroidism   Renal/GU negative Renal ROS  negative genitourinary   Musculoskeletal   Abdominal   Peds  Hematology  (+) Blood dyscrasia, anemia ,   Anesthesia Other Findings   Reproductive/Obstetrics negative OB ROS                             Anesthesia Physical Anesthesia Plan  ASA: 3  Anesthesia Plan: General   Post-op Pain Management:    Induction:   PONV Risk Score and Plan: Propofol infusion  Airway Management Planned:   Additional Equipment:   Intra-op Plan:   Post-operative Plan:   Informed Consent: I have reviewed the patients History and Physical, chart, labs and discussed the procedure including the risks, benefits and alternatives for the proposed anesthesia with the patient or authorized representative who has indicated his/her understanding and acceptance.     Dental Advisory Given  Plan Discussed with: CRNA  Anesthesia Plan Comments:         Anesthesia Quick Evaluation

## 2022-07-06 NOTE — Op Note (Signed)
[facility] Patient Name: [name] [name] Procedure Date: 07/06/2022 10:20 AM MRN: [id number] Date of Birth: 1941/08/05 Attending MD: Norvel Richards , MD CSN: [id number] Age: 81 Admit Type: Outpatient Procedure:                Colonoscopy Indications:              Screening for colorectal malignant neoplasm Providers:                Norvel Richards, MD, Lambert Mody, Caprice Kluver, Janeece Riggers, RN, Vista Mink,                            Technician Referring MD:             Norvel Richards, MD Medicines:                Propofol per Anesthesia Complications:            No immediate complications. Estimated Blood Loss:     Estimated blood loss was minimal. Procedure:                Pre-Anesthesia Assessment:                           - Prior to the procedure, a History and Physical                            was performed, and patient medications and                            allergies were reviewed. The patient's tolerance of                            previous anesthesia was also reviewed. The risks                            and benefits of the procedure and the sedation                            options and risks were discussed with the patient.                            All questions were answered, and informed consent                            was obtained. Prior Anticoagulants: The patient has                            taken no previous anticoagulant or antiplatelet                            agents. ASA Grade Assessment: III - A patient with  severe systemic disease. After reviewing the risks                            and benefits, the patient was deemed in                            satisfactory condition to undergo the procedure.                           After obtaining informed consent, the colonoscope                            was passed under direct vision. Throughout the                             procedure, the patient's blood pressure, pulse, and                            oxygen saturations were monitored continuously. The                            [id number]) scope was introduced through                            the anus and advanced to the the cecum, identified                            by appendiceal orifice and ileocecal valve. The                            colonoscopy was performed without difficulty. The                            patient tolerated the procedure well. The quality                            of the bowel preparation was adequate. Scope In: 10:26:16 AM Scope Out: 10:49:03 AM Scope Withdrawal Time: 0 hours 11 minutes 34 seconds  Total Procedure Duration: 0 hours 22 minutes 47 seconds  Findings:      The perianal and digital rectal examinations were normal.      Scattered small and large-mouthed diverticula were found in the sigmoid       colon and descending colon. Redundant colon requiring external abdominal       pressure to reach the cecum.      In the rectum. There was a 4 x 5 area of erythematous somewhat fibrotic       appearing mucosa. This was submucosal. It appeared to be somewhat of a       mass when rectum deflated but it flattened out totally when the rectum       was insufflated. Rectal vault too small to retroflex; mucosa seen well       on?"face. Query rectal prolapse. Biopsies taken. Impression:               - Diverticulosis in the sigmoid colon and in  the                            descending colon. Redundant colon.                           -Abnormal rectum. Query prolapse. Status post biopsy Moderate Sedation:      Moderate (conscious) sedation was personally administered by an       anesthesia professional. The following parameters were monitored: oxygen       saturation, heart rate, blood pressure, respiratory rate, EKG, adequacy       of pulmonary ventilation, and response to care. Recommendation:            - Patient has a contact number available for                            emergencies. The signs and symptoms of potential                            delayed complications were discussed with the                            patient. Return to normal activities tomorrow.                            Written discharge instructions were provided to the                            patient.                           - Advance diet as tolerated.                           - Continue present medications.                           - No repeat colonoscopy.                           - Return to GI clinic in 3 months. Procedure Code(s):        --- Professional ---                           9064275142, Colonoscopy, flexible; diagnostic, including                            collection of specimen(s) by brushing or washing,                            when performed (separate procedure) Diagnosis Code(s):        --- Professional ---                           Z12.11, Encounter for screening for malignant  neoplasm of colon                           K57.30, Diverticulosis of large intestine without                            perforation or abscess without bleeding CPT copyright 2019 [employer]. All rights reserved. The codes documented in this report are preliminary and upon coder review may  be revised to meet current compliance requirements. Cristopher Estimable. Cletus Mehlhoff, MD Norvel Richards, MD 07/06/2022 10:54:28 AM This report has been signed electronically. Number of Addenda: 0

## 2022-07-06 NOTE — Op Note (Signed)
[facility] Patient Name: [name] [name] Procedure Date: 07/06/2022 9:43 AM MRN: [id number] Date of Birth: March 03, 1941 Attending MD: Norvel Richards , MD CSN: [id number] Age: 81 Admit Type: Outpatient Procedure:                Upper GI endoscopy Indications:              Dysphagia Providers:                Norvel Richards, MD, Lambert Mody, Ihor Gully, RN, , Suzan Garibaldi. Risa Grill,                            Technician Referring MD:             Norvel Richards, MD Medicines:                Propofol per Anesthesia Complications:            No immediate complications. Estimated Blood Loss:     Estimated blood loss was minimal. Procedure:                Pre-Anesthesia Assessment:                           - Prior to the procedure, a History and Physical                            was performed, and patient medications and                            allergies were reviewed. The patient's tolerance of                            previous anesthesia was also reviewed. The risks                            and benefits of the procedure and the sedation                            options and risks were discussed with the patient.                            All questions were answered, and informed consent                            was obtained. Prior Anticoagulants: The patient has                            taken no previous anticoagulant or antiplatelet                            agents. ASA Grade Assessment: III - A patient with  severe systemic disease. After reviewing the risks                            and benefits, the patient was deemed in                            satisfactory condition to undergo the procedure.                           After obtaining informed consent, the endoscope was                            passed under direct vision. Throughout the                            procedure,  the patient's blood pressure, pulse, and                            oxygen saturations were monitored continuously. The                            [id number]-[id number] ([id number] scope was introduced through the                            mouth, and advanced to the second part of duodenum.                            The upper GI endoscopy was accomplished without                            difficulty. The patient tolerated the procedure                            well. Scope In: 10:09:58 AM Scope Out: 10:18:26 AM Total Procedure Duration: 0 hours 8 minutes 28 seconds  Findings:      A mild Schatzki ring was found at the gastroesophageal junction.       Geographic distal esophageal erosion/ulceration straddling the GE       junction overlying the region extending up 10 cm from the distal       esophagus. No tumor. No Barrett's epithelium seen. Adult gastroscope       passed across the EG junction without any difficulty. Patchy erythema       and multiple small punctate gastric body and antral hemorrhages. No       ulcer or infiltrating process observed. Pylorus patent.      A medium-sized hiatal hernia was present.      The duodenal bulb and second portion of the duodenum were normal. The       scope was withdrawn. Dilation was performed with a Maloney dilator with       mild resistance at 35 Fr. The dilation site was examined following       endoscope reinsertion and showed no change. Estimated blood loss: none.       Finally, biopsies of the gastric antrum and body taken for histologic       study Impression:               -  Mild Schatzki ring. Dilated. Geographic erosive                            ulcerative reflux esophagitis                           - Medium-sized hiatal hernia. Erythema and punctate                            submucosal hemorrhage -status post gastric biopsy                           - Normal duodenal bulb and second portion of the                             duodenum. Moderate Sedation:      Moderate (conscious) sedation was personally administered by an       anesthesia professional. The following parameters were monitored: oxygen       saturation, heart rate, blood pressure, respiratory rate, EKG, adequacy       of pulmonary ventilation, and response to care. Recommendation:           - Patient has a contact number available for                            emergencies. The signs and symptoms of potential                            delayed complications were discussed with the                            patient. Return to normal activities tomorrow.                            Written discharge instructions were provided to the                            patient.                           - Advance diet as tolerated. Begin Protonix 40 mg                            daily 30 minutes before breakfast. Stop cimetidine.                           -Office visit with [place] in 3 months. See colonoscopy                            report. Further recommendations to follow pending                            review of pathology report Procedure Code(s):        --- Professional ---  90300, Esophagogastroduodenoscopy, flexible,                            transoral; diagnostic, including collection of                            specimen(s) by brushing or washing, when performed                            (separate procedure)                           43450, Dilation of esophagus, by unguided sound or                            bougie, single or multiple passes Diagnosis Code(s):        --- Professional ---                           K22.2, Esophageal obstruction                           K44.9, Diaphragmatic hernia without obstruction or                            gangrene                           R13.10, Dysphagia, unspecified CPT copyright 2019 American Medical Association. All rights reserved. The codes documented in this report are  preliminary and upon coder review may  be revised to meet current compliance requirements. Cristopher Estimable. Crickett Abbett, MD Norvel Richards, MD 07/06/2022 10:50:49 AM This report has been signed electronically. Number of Addenda: 0

## 2022-07-06 NOTE — Telephone Encounter (Signed)
-----   Message from Daneil Dolin, MD sent at 07/06/2022 11:01 AM EDT -----   We are stopping cimetidine.  Send a new prescription to her pharmacy.  Protonix 40 mg tablet.  Take 1 daily 30 minutes for breakfast.  Dispense 30 with 11 refills.  Thanks.

## 2022-07-06 NOTE — Telephone Encounter (Signed)
RX was sent to pharmacy on file. 

## 2022-07-06 NOTE — Transfer of Care (Signed)
Immediate Anesthesia Transfer of Care Note  Patient: [name] [name]  Procedure(s) Performed: COLONOSCOPY WITH PROPOFOL ESOPHAGOGASTRODUODENOSCOPY (EGD) WITH PROPOFOL MALONEY DILATION BIOPSY  Patient Location: PACU and Short Stay  Anesthesia Type:MAC  Level of Consciousness: awake  Airway & Oxygen Therapy: Patient Spontanous Breathing  Post-op Assessment: Report given to RN  Post vital signs: Reviewed  Last Vitals:  Vitals Value Taken Time  BP 107/72 07/06/22 1053  Temp 36.7 C 07/06/22 1053  Pulse 101 07/06/22 1053  Resp 18 07/06/22 1053  SpO2 97 % 07/06/22 1053    Last Pain:  Vitals:   07/06/22 1053  TempSrc: Oral  PainSc: 0-No pain      Patients Stated Pain Goal: 6 (16/10/96 0454)  Complications: There were no known notable events for this encounter.

## 2022-07-06 NOTE — Discharge Instructions (Signed)
Colonoscopy Discharge Instructions  Read the instructions outlined below and refer to this sheet in the next few weeks. These discharge instructions provide you with general information on caring for yourself after you leave the hospital. Your doctor may also give you specific instructions. While your treatment has been planned according to the most current medical practices available, unavoidable complications occasionally occur. If you have any problems or questions after discharge, call Dr. Gala Romney at [phone]. ACTIVITY You may resume your regular activity, but move at a slower pace for the next 24 hours.  Take frequent rest periods for the next 24 hours.  Walking will help get rid of the air and reduce the bloated feeling in your belly (abdomen).  No driving for 24 hours (because of the medicine (anesthesia) used during the test).   Do not sign any important legal documents or operate any machinery for 24 hours (because of the anesthesia used during the test).  NUTRITION Drink plenty of fluids.  You may resume your normal diet as instructed by your doctor.  Begin with a light meal and progress to your normal diet. Heavy or fried foods are harder to digest and may make you feel sick to your stomach (nauseated).  Avoid alcoholic beverages for 24 hours or as instructed.  MEDICATIONS You may resume your normal medications unless your doctor tells you otherwise.  WHAT YOU CAN EXPECT TODAY Some feelings of bloating in the abdomen.  Passage of more gas than usual.  Spotting of blood in your stool or on the toilet paper.  IF YOU HAD POLYPS REMOVED DURING THE COLONOSCOPY: No aspirin products for 7 days or as instructed.  No alcohol for 7 days or as instructed.  Eat a soft diet for the next 24 hours.  FINDING OUT THE RESULTS OF YOUR TEST Not all test results are available during your visit. If your test results are not back during the visit, make an appointment with your caregiver to find out the  results. Do not assume everything is normal if you have not heard from your caregiver or the medical facility. It is important for you to follow up on all of your test results.  SEEK IMMEDIATE MEDICAL ATTENTION IF: You have more than a spotting of blood in your stool.  Your belly is swollen (abdominal distention).  You are nauseated or vomiting.  You have a temperature over 101.  You have abdominal pain or discomfort that is severe or gets worse throughout the day.   EGD Discharge instructions Please read the instructions outlined below and refer to this sheet in the next few weeks. These discharge instructions provide you with general information on caring for yourself after you leave the hospital. Your doctor may also give you specific instructions. While your treatment has been planned according to the most current medical practices available, unavoidable complications occasionally occur. If you have any problems or questions after discharge, please call your doctor. ACTIVITY You may resume your regular activity but move at a slower pace for the next 24 hours.  Take frequent rest periods for the next 24 hours.  Walking will help expel (get rid of) the air and reduce the bloated feeling in your abdomen.  No driving for 24 hours (because of the anesthesia (medicine) used during the test).  You may shower.  Do not sign any important legal documents or operate any machinery for 24 hours (because of the anesthesia used during the test).  NUTRITION Drink plenty of fluids.  You may  resume your normal diet.  Begin with a light meal and progress to your normal diet.  Avoid alcoholic beverages for 24 hours or as instructed by your caregiver.  MEDICATIONS You may resume your normal medications unless your caregiver tells you otherwise.  WHAT YOU CAN EXPECT TODAY You may experience abdominal discomfort such as a feeling of fullness or "gas" pains.  FOLLOW-UP Your doctor will discuss the results of  your test with you.  SEEK IMMEDIATE MEDICAL ATTENTION IF ANY OF THE FOLLOWING OCCUR: Excessive nausea (feeling sick to your stomach) and/or vomiting.  Severe abdominal pain and distention (swelling).  Trouble swallowing.  Temperature over 101 F (37.8 C).  Rectal bleeding or vomiting of blood.     You have severe acid reflux damage in your esophagus.  Your esophagus was stretched today.    Stop cimetidine; begin Protonix 40 mg daily-30 minutes before breakfast.  New prescription provided through the office.    Your stomach was inflamed.  Biopsies were taken.    You have diverticulosis in your colon.  There was an area of inflammation in your rectum which was biopsied.  No polyps.    Further recommendations to follow pending review of pathology report  Office visit with [place] in 3 months   at patient request, I called Ander Gaster at [phone] -  reviewed findings and recommendations

## 2022-07-06 NOTE — H&P (Signed)
$'@LOGO'j$ @   Primary Care Physician:  Renee Rival, FNP Primary Gastroenterologist:  Dr. Gala Romney  Pre-Procedure History & Physical: HPI:  [name] [name] is a 81 y.o. female here for   1 more screening colonoscopy.  No bowel symptoms at this time.  Also intermittent esophageal dysphagia.  Here for an EGD with esophageal dilation as feasible/appropriate per plan.  Past Medical History:  Diagnosis Date   Anemia    Anxiety    Arthritis    BIPOLAR AFFECTIVE DISORDER 05/11/2009   Qualifier: Diagnosis of  By: Orville Govern, CMA, Carol     Bipolar affective disorder [facility])    Bipolar disorder ([place])    Cataract    Chest pain 05/11/2009   Qualifier: Diagnosis of  By: Orville Govern, CMA, Carol     Chronic back pain    Chronic cough 11/23/2014   Chronic fatigue    Chronic pain syndrome 0/07/3817   Complication of anesthesia    Constipation    Depression    Difficulty in walking(719.7) 07/26/2011   Dizziness    Dyslipidemia    Fecal incontinence 11/23/2014   Fibromyalgia    GERD (gastroesophageal reflux disease)    H/O anxiety disorder 03/05/2013   Overview:  Heart Cath x 2 per patient; most recent 4 yrs ago with "negative" findings per patient; Cardiologist, Dr Dorris Carnes following; last seen 10/2011; most recent episode 11/2012 with admit [facility]) and negative work-up per patient    H/O: hysterectomy    History of back surgery 03/05/2013   History of gastroesophageal reflux (GERD) 03/05/2013   History of kidney stones    Hx of adenomatous colonic polyps 2005   Hyperlipidemia    Hypertension    Hypothyroidism    Knee pain    Midsternal chest pain 08/19/2015   Muscle weakness (generalized) 04/27/2013   Non-obstructive CAD    a. 08/2015 Cath: LM 40, LCX small, 30ost, 16m OM2 small, RCA nl/small, EF 55-65%.   Osteopenia    PONV (postoperative nausea and vomiting)    Primary osteoarthritis of left knee 04/17/2020   Scalp laceration 03/01/2020   Sciatica     Past Surgical History:   Procedure Laterality Date   ABDOMINAL HYSTERECTOMY  1994   TAH- DUB, BSO   [place] bunionectomy     bilateral   BREAST BIOPSY Left 1989   BREAST LUMPECTOMY     BREAST SURGERY N/A    [employer] 09/14/2020   BUNIONECTOMY Left 2005   CARDIAC CATHETERIZATION  2010   +CAD   CARDIAC CATHETERIZATION N/A 08/19/2015   Procedure: Left Heart Cath and Coronary Angiography;  Surgeon: HBelva Crome MD;  Location: [facility];  Service: Cardiovascular;  Laterality: N/A;   COLONOSCOPY  01/29/2007   REXH:BZJIRCrectum, left-sided diverticula, 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  Remainder of colonic mucosa was normal   COLONOSCOPY WITH ESOPHAGOGASTRODUODENOSCOPY (EGD) N/A 02/01/2014   RVEL:FYBOerosive reflux/gastric polyp/normal rectum/scattered left sided diverticula, normal distal TI. gastric bx negative, fundic gland polyp. next TCS 01/2019.   CYSTECTOMY     pilonidial cyst   ESOPHAGOGASTRODUODENOSCOPY     followed by colonoscopy with snare polypectomy   EYE SURGERY Bilateral    cataract surgery with lens implants   JOINT REPLACEMENT N/A    [employer] 09/14/2020   REVISION TOTAL HIP ARTHROPLASTY Right    right knee replacement  02/2013   SHOULDER ARTHROSCOPY Left 2005   SHOULDER ARTHROSCOPY Right    SPINAL FUSION     SPINAL  FUSION  2004   L4-L5   SPINE SURGERY N/A    [employer] 09/14/2020   TOTAL HIP ARTHROPLASTY Right 06/2006   TOTAL HIP ARTHROPLASTY  8/12   TOTAL KNEE ARTHROPLASTY Left 04/18/2020   TOTAL KNEE ARTHROPLASTY Left 04/18/2020   Procedure: LEFT TOTAL KNEE ARTHROPLASTY;  Surgeon: Leandrew Koyanagi, MD;  Location: [place];  Service: Orthopedics;  Laterality: Left;   TOTAL SHOULDER REPLACEMENT Left 08/2005    Prior to Admission medications   Medication Sig Start Date End Date Taking? Authorizing Provider  aspirin EC 81 MG tablet Take 81 mg by mouth daily. Swallow whole.   Yes [provider]  atorvastatin (LIPITOR) 40 MG tablet Take 1 tablet (40 mg total)  by mouth daily. 05/03/22  Yes Paseda, Dewaine Conger, FNP  bisacodyl (DULCOLAX) 5 MG EC tablet Take 1 tablet by mouth daily as needed for mild constipation (constipation).    Yes [provider]  Cholecalciferol (VITAMIN D) 50 MCG (2000 UT) CAPS Take 2,000 Units by mouth daily.   Yes [provider]  cimetidine (TAGAMET) 200 MG tablet Take 200 mg by mouth daily.   Yes [provider]  DULoxetine (CYMBALTA) 60 MG capsule Take 120 mg by mouth daily.   Yes [provider]  estradiol (ESTRACE) 1 MG tablet TAKE 1/2 TABLET BY MOUTH DAILY. 06/27/22  Yes Lindell Spar, MD  gabapentin (NEURONTIN) 100 MG capsule Take 100 mg by mouth 2 (two) times daily as needed (pain). 03/04/21  Yes [provider]  lamoTRIgine (LAMICTAL) 150 MG tablet Take 150 mg by mouth at bedtime.   Yes [provider]  levothyroxine (SYNTHROID) 25 MCG tablet Take 1 tablet (25 mcg total) by mouth daily. 12/13/21  Yes Paseda, Dewaine Conger, FNP  losartan (COZAAR) 50 MG tablet TAKE (1) TABLET BY MOUTH ONCE DAILY. 06/27/22  Yes Fay Records, MD  Multiple Vitamin (MULTIVITAMIN) tablet Take 1 tablet by mouth daily.   Yes [provider]  niacin (SLO-NIACIN) 500 MG tablet Take 500 mg by mouth daily.   Yes [provider]  nitroGLYCERIN (NITROSTAT) 0.4 MG SL tablet PLACE 1 TAB UNDER TONGUE EVERY 5 MIN IF NEEDED FOR CHEST PAIN. MAY USE 3 TIMES.NO RELIEF CALL 911. 11/24/21  Yes Fay Records, MD  Omega-3 Fatty Acids (FISH OIL) 1000 MG CAPS Take 1,000 mg by mouth daily.   Yes [provider]  Probiotic Product (PROBIOTIC DAILY PO) Take 1 tablet by mouth daily.    Yes [provider]  Propylene Glycol (SYSTANE BALANCE) 0.6 % SOLN Place 1 drop into both eyes 2 (two) times daily as needed (dry eyes).   Yes [provider]  risperiDONE (RISPERDAL) 1 MG tablet Take 0.5 mg by mouth at bedtime.   Yes [provider]  WELLBUTRIN XL 300 MG 24 hr tablet Take  300 mg by mouth daily.  07/08/14  Yes [provider]  ALPRAZolam Duanne Moron) 0.5 MG tablet Take 0.25-0.5 mg by mouth daily as needed for anxiety.  12/15/13   [provider]  meloxicam (MOBIC) 7.5 MG tablet TAKE 1 TABLET BY MOUTH TWICE DAILY AS NEEDED FOR PAIN. 01/31/22   Aundra Dubin, PA-C  polyethylene glycol-electrolytes (NULYTELY) 420 g solution As directed 05/28/22   Amberle Lyter, Cristopher Estimable, MD    Allergies as of 05/10/2022 - Review Complete 05/03/2022  Allergen Reaction Noted   Hydrocodone-acetaminophen Hives and Itching 07/05/2008    Family History  Problem Relation Age of Onset   Heart disease  Mother    Heart attack Mother    Heart disease Father    Heart attack Father    Stroke Sister    Heart disease Sister        CABG   Stomach cancer Maternal Aunt    Breast cancer Maternal Aunt    Stroke Maternal Grandfather    Cancer Paternal Grandmother        unknown primary   Colon cancer Neg Hx    Cervical cancer Neg Hx     Social History   Socioeconomic History   Marital status: Widowed    Spouse name: Passed in Jan 2013   Number of children: 2   Years of education: Not on file   Highest education level: Master's degree (e.g., MA, MS, MEng, MEd, MSW, MBA)  Occupational History   Occupation: unemployed    Employer: RETIRED    Comment: retired Education officer, [employer]  Tobacco Use   Smoking status: Never   Smokeless tobacco: Never  Vaping Use   Vaping Use: Never used  Substance and Sexual Activity   Alcohol use: Yes    Alcohol/week: 5.0 standard drinks of alcohol    Types: 5 Glasses of wine per week    Comment: last drink was 3 weeks ago.4-5 times a week, 5 unces each time   Drug use: No   Sexual activity: Not Currently    Partners: Male    Birth control/protection: Surgical    Comment: TAH  Other Topics Concern   Not on file  Social History Narrative   Lives alone   2 children, 9 grandchildren, 7 great grand   One in [place] and other one is in [place], children  check on her.      Enjoy: reading;       Diet: eats all food groups    Caffeine: 2 cups of coffee, some tea   Water: 6-8 cups day       Wears seat belt    Does not use phone while driving   Smoke detectors at home   No weapons    Social Determinants of Health   Financial Resource Strain: Low Risk  (10/03/2021)   Overall Financial Resource Strain (CARDIA)    Difficulty of Paying Living Expenses: Not hard at all  Food Insecurity: No Food Insecurity (10/03/2021)   Hunger Vital Sign    Worried About Running Out of Food in the Last Year: Never true    Ran Out of Food in the Last Year: Never true  Transportation Needs: Unmet Transportation Needs (10/03/2021)   PRAPARE - Transportation    Lack of Transportation (Medical): Yes    Lack of Transportation (Non-Medical): Yes  Physical Activity: Insufficiently Active (10/03/2021)   Exercise Vital Sign    Days of Exercise per Week: 5 days    Minutes of Exercise per Session: 10 min  Stress: No Stress Concern Present (10/03/2021)   [place]    Feeling of Stress : Only a little  Social Connections: Moderately Isolated (10/03/2021)   Social Connection and Isolation Panel [NHANES]    Frequency of Communication with Friends and Family: More than three times a week    Frequency of Social Gatherings with Friends and Family: Three times a week    Attends Religious Services: 1 to 4 times per year    Active Member of Clubs or Organizations: No    Attends Archivist Meetings: Never    Marital Status: Separated  Intimate Partner Violence: Not At Risk (10/03/2021)   Humiliation, Afraid, Rape, and Kick questionnaire    Fear of Current or Ex-Partner: No    Emotionally Abused: No    Physically Abused: No    Sexually Abused: No    Review of Systems: See HPI, otherwise negative ROS  Physical Exam: BP 131/77   Pulse 95   Temp 97.9 F (36.6 C) (Oral)   Resp 17   LMP  02/10/1993   SpO2 98%  General:   Alert,  Well-developed, well-nourished, pleasant and cooperative in NAD Neck:  Supple; no masses or thyromegaly. No significant cervical adenopathy. Lungs:  Clear throughout to auscultation.   No wheezes, crackles, or rhonchi. No acute distress. Heart:  Regular rate and rhythm; no murmurs, clicks, rubs,  or gallops. Abdomen: Non-distended, normal bowel sounds.  Soft and nontender without appreciable mass or hepatosplenomegaly.  Pulses:  Normal pulses noted. Extremities:  Without clubbing or edema.  Impression/Plan:    81 year old lady with intermittent esophageal dysphagia on a background setting of GERD.  The risks, benefits, limitations, imponderables and alternatives regarding both EGD and colonoscopy have been reviewed with the patient. Questions have been answered. All parties agreeable.       Notice: This dictation was prepared with Dragon dictation along with smaller phrase technology. Any transcriptional errors that result from this process are unintentional and may not be corrected upon review.

## 2022-07-07 NOTE — Anesthesia Postprocedure Evaluation (Signed)
Anesthesia Post Note  Patient: [name] [name]  Procedure(s) Performed: COLONOSCOPY WITH PROPOFOL ESOPHAGOGASTRODUODENOSCOPY (EGD) WITH PROPOFOL [place]  Patient location during evaluation: Phase II Anesthesia Type: MAC Level of consciousness: awake Pain management: pain level controlled Vital Signs Assessment: post-procedure vital signs reviewed and stable Respiratory status: spontaneous breathing and respiratory function stable Cardiovascular status: blood pressure returned to baseline and stable Postop Assessment: no headache and no apparent nausea or vomiting Anesthetic complications: no Comments: Late entry   There were no known notable events for this encounter.   Last Vitals:  Vitals:   07/06/22 0855 07/06/22 1053  BP: 131/77 107/72  Pulse: 95 (!) 101  Resp: 17 18  Temp: 36.6 C 36.7 C  SpO2: 98% 97%    Last Pain:  Vitals:   07/06/22 1053  TempSrc: Oral  PainSc: 0-No pain                 Louann Sjogren

## 2022-07-09 ENCOUNTER — Other Ambulatory Visit: Payer: Self-pay | Admitting: Physician Assistant

## 2022-07-09 LAB — SURGICAL PATHOLOGY

## 2022-07-12 ENCOUNTER — Encounter (HOSPITAL_COMMUNITY): Payer: Self-pay | Admitting: Internal Medicine

## 2022-07-16 ENCOUNTER — Encounter (HOSPITAL_COMMUNITY): Payer: Self-pay | Admitting: Emergency Medicine

## 2022-07-16 ENCOUNTER — Other Ambulatory Visit: Payer: Self-pay

## 2022-07-16 ENCOUNTER — Emergency Department (HOSPITAL_COMMUNITY): Payer: Medicare PPO

## 2022-07-16 ENCOUNTER — Inpatient Hospital Stay (HOSPITAL_COMMUNITY): Payer: Medicare PPO

## 2022-07-16 ENCOUNTER — Inpatient Hospital Stay (HOSPITAL_COMMUNITY)
Admission: EM | Admit: 2022-07-16 | Discharge: 2022-08-12 | DRG: 871 | Disposition: E | Payer: Medicare PPO | Attending: Family Medicine | Admitting: Family Medicine

## 2022-07-16 DIAGNOSIS — Z79890 Hormone replacement therapy (postmenopausal): Secondary | ICD-10-CM

## 2022-07-16 DIAGNOSIS — R6520 Severe sepsis without septic shock: Secondary | ICD-10-CM | POA: Diagnosis not present

## 2022-07-16 DIAGNOSIS — I2510 Atherosclerotic heart disease of native coronary artery without angina pectoris: Secondary | ICD-10-CM | POA: Diagnosis present

## 2022-07-16 DIAGNOSIS — E871 Hypo-osmolality and hyponatremia: Secondary | ICD-10-CM | POA: Diagnosis not present

## 2022-07-16 DIAGNOSIS — F310 Bipolar disorder, current episode hypomanic: Secondary | ICD-10-CM | POA: Diagnosis not present

## 2022-07-16 DIAGNOSIS — K449 Diaphragmatic hernia without obstruction or gangrene: Secondary | ICD-10-CM | POA: Diagnosis not present

## 2022-07-16 DIAGNOSIS — E039 Hypothyroidism, unspecified: Secondary | ICD-10-CM | POA: Diagnosis present

## 2022-07-16 DIAGNOSIS — J189 Pneumonia, unspecified organism: Secondary | ICD-10-CM | POA: Diagnosis present

## 2022-07-16 DIAGNOSIS — E860 Dehydration: Secondary | ICD-10-CM | POA: Diagnosis present

## 2022-07-16 DIAGNOSIS — E861 Hypovolemia: Secondary | ICD-10-CM | POA: Diagnosis present

## 2022-07-16 DIAGNOSIS — M797 Fibromyalgia: Secondary | ICD-10-CM | POA: Diagnosis present

## 2022-07-16 DIAGNOSIS — Z7982 Long term (current) use of aspirin: Secondary | ICD-10-CM

## 2022-07-16 DIAGNOSIS — E872 Acidosis, unspecified: Secondary | ICD-10-CM | POA: Diagnosis present

## 2022-07-16 DIAGNOSIS — Z515 Encounter for palliative care: Secondary | ICD-10-CM

## 2022-07-16 DIAGNOSIS — A419 Sepsis, unspecified organism: Secondary | ICD-10-CM | POA: Diagnosis present

## 2022-07-16 DIAGNOSIS — R0902 Hypoxemia: Secondary | ICD-10-CM | POA: Diagnosis not present

## 2022-07-16 DIAGNOSIS — M47815 Spondylosis without myelopathy or radiculopathy, thoracolumbar region: Secondary | ICD-10-CM | POA: Diagnosis not present

## 2022-07-16 DIAGNOSIS — R5382 Chronic fatigue, unspecified: Secondary | ICD-10-CM | POA: Diagnosis present

## 2022-07-16 DIAGNOSIS — N179 Acute kidney failure, unspecified: Secondary | ICD-10-CM | POA: Diagnosis not present

## 2022-07-16 DIAGNOSIS — Z981 Arthrodesis status: Secondary | ICD-10-CM

## 2022-07-16 DIAGNOSIS — K828 Other specified diseases of gallbladder: Secondary | ICD-10-CM | POA: Diagnosis not present

## 2022-07-16 DIAGNOSIS — Z20822 Contact with and (suspected) exposure to covid-19: Secondary | ICD-10-CM | POA: Diagnosis not present

## 2022-07-16 DIAGNOSIS — F319 Bipolar disorder, unspecified: Secondary | ICD-10-CM | POA: Diagnosis present

## 2022-07-16 DIAGNOSIS — G894 Chronic pain syndrome: Secondary | ICD-10-CM | POA: Diagnosis present

## 2022-07-16 DIAGNOSIS — R34 Anuria and oliguria: Secondary | ICD-10-CM | POA: Diagnosis present

## 2022-07-16 DIAGNOSIS — Z96653 Presence of artificial knee joint, bilateral: Secondary | ICD-10-CM | POA: Diagnosis present

## 2022-07-16 DIAGNOSIS — Y92009 Unspecified place in unspecified non-institutional (private) residence as the place of occurrence of the external cause: Secondary | ICD-10-CM | POA: Diagnosis not present

## 2022-07-16 DIAGNOSIS — Z8249 Family history of ischemic heart disease and other diseases of the circulatory system: Secondary | ICD-10-CM

## 2022-07-16 DIAGNOSIS — R053 Chronic cough: Secondary | ICD-10-CM | POA: Diagnosis present

## 2022-07-16 DIAGNOSIS — M1990 Unspecified osteoarthritis, unspecified site: Secondary | ICD-10-CM | POA: Diagnosis present

## 2022-07-16 DIAGNOSIS — Z66 Do not resuscitate: Secondary | ICD-10-CM | POA: Diagnosis not present

## 2022-07-16 DIAGNOSIS — N3000 Acute cystitis without hematuria: Secondary | ICD-10-CM | POA: Diagnosis not present

## 2022-07-16 DIAGNOSIS — W19XXXA Unspecified fall, initial encounter: Secondary | ICD-10-CM | POA: Diagnosis present

## 2022-07-16 DIAGNOSIS — Z885 Allergy status to narcotic agent status: Secondary | ICD-10-CM

## 2022-07-16 DIAGNOSIS — K219 Gastro-esophageal reflux disease without esophagitis: Secondary | ICD-10-CM | POA: Diagnosis present

## 2022-07-16 DIAGNOSIS — K5730 Diverticulosis of large intestine without perforation or abscess without bleeding: Secondary | ICD-10-CM | POA: Diagnosis not present

## 2022-07-16 DIAGNOSIS — Z86010 Personal history of colonic polyps: Secondary | ICD-10-CM

## 2022-07-16 DIAGNOSIS — I959 Hypotension, unspecified: Secondary | ICD-10-CM | POA: Diagnosis not present

## 2022-07-16 DIAGNOSIS — N390 Urinary tract infection, site not specified: Secondary | ICD-10-CM | POA: Diagnosis not present

## 2022-07-16 DIAGNOSIS — J9601 Acute respiratory failure with hypoxia: Secondary | ICD-10-CM | POA: Diagnosis not present

## 2022-07-16 DIAGNOSIS — E785 Hyperlipidemia, unspecified: Secondary | ICD-10-CM | POA: Diagnosis present

## 2022-07-16 DIAGNOSIS — F05 Delirium due to known physiological condition: Secondary | ICD-10-CM | POA: Diagnosis not present

## 2022-07-16 DIAGNOSIS — G9341 Metabolic encephalopathy: Secondary | ICD-10-CM | POA: Diagnosis not present

## 2022-07-16 DIAGNOSIS — Z79899 Other long term (current) drug therapy: Secondary | ICD-10-CM

## 2022-07-16 DIAGNOSIS — Z043 Encounter for examination and observation following other accident: Secondary | ICD-10-CM | POA: Diagnosis not present

## 2022-07-16 DIAGNOSIS — I10 Essential (primary) hypertension: Secondary | ICD-10-CM | POA: Diagnosis not present

## 2022-07-16 DIAGNOSIS — N170 Acute kidney failure with tubular necrosis: Secondary | ICD-10-CM | POA: Diagnosis present

## 2022-07-16 DIAGNOSIS — Z9842 Cataract extraction status, left eye: Secondary | ICD-10-CM

## 2022-07-16 DIAGNOSIS — R918 Other nonspecific abnormal finding of lung field: Secondary | ICD-10-CM

## 2022-07-16 DIAGNOSIS — Z9841 Cataract extraction status, right eye: Secondary | ICD-10-CM

## 2022-07-16 DIAGNOSIS — Z96641 Presence of right artificial hip joint: Secondary | ICD-10-CM | POA: Diagnosis present

## 2022-07-16 DIAGNOSIS — Z7189 Other specified counseling: Secondary | ICD-10-CM | POA: Diagnosis not present

## 2022-07-16 DIAGNOSIS — R531 Weakness: Secondary | ICD-10-CM | POA: Diagnosis not present

## 2022-07-16 DIAGNOSIS — M1712 Unilateral primary osteoarthritis, left knee: Secondary | ICD-10-CM | POA: Diagnosis present

## 2022-07-16 DIAGNOSIS — G4489 Other headache syndrome: Secondary | ICD-10-CM | POA: Diagnosis not present

## 2022-07-16 DIAGNOSIS — M8580 Other specified disorders of bone density and structure, unspecified site: Secondary | ICD-10-CM | POA: Diagnosis present

## 2022-07-16 DIAGNOSIS — R000 Tachycardia, unspecified: Secondary | ICD-10-CM | POA: Diagnosis not present

## 2022-07-16 DIAGNOSIS — A4151 Sepsis due to Escherichia coli [E. coli]: Principal | ICD-10-CM | POA: Diagnosis present

## 2022-07-16 DIAGNOSIS — F419 Anxiety disorder, unspecified: Secondary | ICD-10-CM | POA: Diagnosis present

## 2022-07-16 DIAGNOSIS — Z96612 Presence of left artificial shoulder joint: Secondary | ICD-10-CM | POA: Diagnosis present

## 2022-07-16 DIAGNOSIS — E162 Hypoglycemia, unspecified: Secondary | ICD-10-CM | POA: Diagnosis not present

## 2022-07-16 DIAGNOSIS — Z90710 Acquired absence of both cervix and uterus: Secondary | ICD-10-CM

## 2022-07-16 DIAGNOSIS — Z961 Presence of intraocular lens: Secondary | ICD-10-CM | POA: Diagnosis present

## 2022-07-16 DIAGNOSIS — Z87442 Personal history of urinary calculi: Secondary | ICD-10-CM

## 2022-07-16 LAB — URINALYSIS, ROUTINE W REFLEX MICROSCOPIC
Bilirubin Urine: NEGATIVE
Glucose, UA: NEGATIVE mg/dL
Ketones, ur: NEGATIVE mg/dL
Nitrite: NEGATIVE
Protein, ur: 100 mg/dL — AB
Specific Gravity, Urine: 1.011 (ref 1.005–1.030)
WBC, UA: >50 WBC/hpf — ABNORMAL HIGH (ref 0–5)
pH: 5 (ref 5.0–8.0)

## 2022-07-16 LAB — CBC WITH DIFFERENTIAL/PLATELET
Abs Immature Granulocytes: 0 10*3/uL (ref 0.00–0.07)
Band Neutrophils: 4 %
Basophils Absolute: 0 10*3/uL (ref 0.0–0.1)
Basophils Relative: 0 %
Eosinophils Absolute: 0.6 10*3/uL — ABNORMAL HIGH (ref 0.0–0.5)
Eosinophils Relative: 2 %
HCT: 31.8 % — ABNORMAL LOW (ref 36.0–46.0)
Hemoglobin: 10.9 g/dL — ABNORMAL LOW (ref 12.0–15.0)
Lymphocytes Relative: 0 %
Lymphs Abs: 0 10*3/uL — ABNORMAL LOW (ref 0.7–4.0)
MCH: 33.1 pg (ref 26.0–34.0)
MCHC: 34.3 g/dL (ref 30.0–36.0)
MCV: 96.7 fL (ref 80.0–100.0)
Monocytes Absolute: 0.6 10*3/uL (ref 0.1–1.0)
Monocytes Relative: 2 %
Neutro Abs: 27.7 10*3/uL — ABNORMAL HIGH (ref 1.7–7.7)
Neutrophils Relative %: 92 %
Platelets: 177 10*3/uL (ref 150–400)
RBC: 3.29 MIL/uL — ABNORMAL LOW (ref 3.87–5.11)
RDW: 13.9 % (ref 11.5–15.5)
WBC: 28.9 10*3/uL — ABNORMAL HIGH (ref 4.0–10.5)
nRBC: 0 % (ref 0.0–0.2)

## 2022-07-16 LAB — RESPIRATORY PANEL BY PCR

## 2022-07-16 LAB — MRSA NEXT GEN BY PCR, NASAL: MRSA by PCR Next Gen: NOT DETECTED

## 2022-07-16 LAB — COMPREHENSIVE METABOLIC PANEL
ALT: 25 U/L (ref 0–44)
AST: 28 U/L (ref 15–41)
Albumin: 2.6 g/dL — ABNORMAL LOW (ref 3.5–5.0)
Alkaline Phosphatase: 136 U/L — ABNORMAL HIGH (ref 38–126)
Anion gap: 10 (ref 5–15)
BUN: 47 mg/dL — ABNORMAL HIGH (ref 8–23)
CO2: 24 mmol/L (ref 22–32)
Calcium: 8.3 mg/dL — ABNORMAL LOW (ref 8.9–10.3)
Chloride: 90 mmol/L — ABNORMAL LOW (ref 98–111)
Creatinine, Ser: 3.87 mg/dL — ABNORMAL HIGH (ref 0.44–1.00)
GFR, Estimated: 11 mL/min — ABNORMAL LOW (ref >60)
Glucose, Bld: 79 mg/dL (ref 70–99)
Potassium: 4 mmol/L (ref 3.5–5.1)
Sodium: 124 mmol/L — ABNORMAL LOW (ref 135–145)
Total Bilirubin: 0.6 mg/dL (ref 0.3–1.2)
Total Protein: 5.7 g/dL — ABNORMAL LOW (ref 6.5–8.1)

## 2022-07-16 LAB — PROTIME-INR
INR: 1.1 (ref 0.8–1.2)
Prothrombin Time: 14.1 seconds (ref 11.4–15.2)

## 2022-07-16 LAB — LACTIC ACID, PLASMA
Lactic Acid, Venous: 1.5 mmol/L (ref 0.5–1.9)
Lactic Acid, Venous: 1.4 mmol/L (ref 0.5–1.9)

## 2022-07-16 LAB — STREP PNEUMONIAE URINARY ANTIGEN: Strep Pneumo Urinary Antigen: NEGATIVE

## 2022-07-16 LAB — CBG MONITORING, ED: Glucose-Capillary: 69 mg/dL — ABNORMAL LOW (ref 70–99)

## 2022-07-16 LAB — RESP PANEL BY RT-PCR (FLU A&B, COVID) ARPGX2
Influenza A by PCR: NEGATIVE
Influenza B by PCR: NEGATIVE
SARS Coronavirus 2 by RT PCR: NEGATIVE

## 2022-07-16 LAB — APTT: aPTT: 29 seconds (ref 24–36)

## 2022-07-16 MED ORDER — RISPERIDONE 1 MG PO TABS
1.0000 mg | ORAL_TABLET | Freq: Every day | ORAL | Status: DC
  Administered 2022-07-16 – 2022-07-17 (×2): 1 mg via ORAL
  Filled 2022-07-16 (×2): qty 1

## 2022-07-16 MED ORDER — SODIUM CHLORIDE 0.9 % IV SOLN: INTRAVENOUS | Status: DC

## 2022-07-16 MED ORDER — ALBUTEROL SULFATE (2.5 MG/3ML) 0.083% IN NEBU
2.5000 mg | INHALATION_SOLUTION | RESPIRATORY_TRACT | Status: DC | PRN
  Administered 2022-07-18 (×2): 2.5 mg via RESPIRATORY_TRACT
  Filled 2022-07-16 (×2): qty 3

## 2022-07-16 MED ORDER — ONDANSETRON HCL 4 MG/2ML IJ SOLN: 4.0000 mg | Freq: Four times a day (QID) | INTRAMUSCULAR | Status: DC | PRN

## 2022-07-16 MED ORDER — CEFEPIME HCL 2 G IV SOLR
2.0000 g | Freq: Once | INTRAVENOUS | Status: AC
  Administered 2022-07-16: 2 g via INTRAVENOUS
  Filled 2022-07-16: qty 12.5

## 2022-07-16 MED ORDER — ACETAMINOPHEN 650 MG RE SUPP: 650.0000 mg | Freq: Four times a day (QID) | RECTAL | Status: DC | PRN

## 2022-07-16 MED ORDER — BUPROPION HCL ER (XL) 150 MG PO TB24
300.0000 mg | ORAL_TABLET | Freq: Every day | ORAL | Status: DC
  Administered 2022-07-16 – 2022-07-17 (×2): 300 mg via ORAL
  Filled 2022-07-16 (×2): qty 2

## 2022-07-16 MED ORDER — IPRATROPIUM-ALBUTEROL 0.5-2.5 (3) MG/3ML IN SOLN
3.0000 mL | Freq: Four times a day (QID) | RESPIRATORY_TRACT | Status: DC
  Administered 2022-07-16: 3 mL via RESPIRATORY_TRACT
  Filled 2022-07-16: qty 3

## 2022-07-16 MED ORDER — LACTATED RINGERS IV BOLUS (SEPSIS)
500.0000 mL | Freq: Once | INTRAVENOUS | Status: AC
  Administered 2022-07-16: 500 mL via INTRAVENOUS

## 2022-07-16 MED ORDER — LAMOTRIGINE 150 MG PO TABS
150.0000 mg | ORAL_TABLET | Freq: Every day | ORAL | Status: DC
  Administered 2022-07-16 – 2022-07-18 (×3): 150 mg via ORAL
  Filled 2022-07-16 (×2): qty 1

## 2022-07-16 MED ORDER — ASPIRIN 81 MG PO TBEC
81.0000 mg | DELAYED_RELEASE_TABLET | Freq: Every day | ORAL | Status: DC
Start: 2022-07-16 — End: 2022-07-19
  Administered 2022-07-16 – 2022-07-17 (×2): 81 mg via ORAL
  Filled 2022-07-16 (×2): qty 1

## 2022-07-16 MED ORDER — SODIUM CHLORIDE 0.9 % IV SOLN
500.0000 mg | INTRAVENOUS | Status: DC
  Administered 2022-07-16 – 2022-07-18 (×3): 500 mg via INTRAVENOUS
  Filled 2022-07-16 (×3): qty 5

## 2022-07-16 MED ORDER — IPRATROPIUM-ALBUTEROL 0.5-2.5 (3) MG/3ML IN SOLN
3.0000 mL | Freq: Four times a day (QID) | RESPIRATORY_TRACT | Status: DC | PRN
  Administered 2022-07-18 – 2022-07-19 (×3): 3 mL via RESPIRATORY_TRACT
  Filled 2022-07-16 (×4): qty 3

## 2022-07-16 MED ORDER — SODIUM CHLORIDE 0.9 % IV SOLN
2.0000 g | INTRAVENOUS | Status: DC
  Administered 2022-07-16 – 2022-07-18 (×3): 2 g via INTRAVENOUS
  Filled 2022-07-16 (×3): qty 20

## 2022-07-16 MED ORDER — GUAIFENESIN ER 600 MG PO TB12
600.0000 mg | ORAL_TABLET | Freq: Two times a day (BID) | ORAL | Status: DC
  Administered 2022-07-16 – 2022-07-17 (×3): 600 mg via ORAL
  Filled 2022-07-16 (×3): qty 1

## 2022-07-16 MED ORDER — POLYVINYL ALCOHOL 1.4 % OP SOLN: 1.0000 [drp] | Freq: Two times a day (BID) | OPHTHALMIC | Status: DC | PRN

## 2022-07-16 MED ORDER — ATORVASTATIN CALCIUM 40 MG PO TABS
40.0000 mg | ORAL_TABLET | Freq: Every day | ORAL | Status: DC
  Administered 2022-07-16 – 2022-07-17 (×2): 40 mg via ORAL
  Filled 2022-07-16 (×2): qty 1

## 2022-07-16 MED ORDER — ACETAMINOPHEN 325 MG PO TABS: 650.0000 mg | ORAL_TABLET | Freq: Four times a day (QID) | ORAL | Status: DC | PRN

## 2022-07-16 MED ORDER — CHLORHEXIDINE GLUCONATE CLOTH 2 % EX PADS
6.0000 | MEDICATED_PAD | Freq: Every day | CUTANEOUS | Status: DC
  Administered 2022-07-17 – 2022-07-19 (×3): 6 via TOPICAL

## 2022-07-16 MED ORDER — HEPARIN SODIUM (PORCINE) 5000 UNIT/ML IJ SOLN
5000.0000 [IU] | Freq: Three times a day (TID) | INTRAMUSCULAR | Status: DC
  Administered 2022-07-16 – 2022-07-19 (×9): 5000 [IU] via SUBCUTANEOUS
  Filled 2022-07-16 (×9): qty 1

## 2022-07-16 MED ORDER — VANCOMYCIN HCL IN DEXTROSE 1-5 GM/200ML-% IV SOLN
1000.0000 mg | Freq: Once | INTRAVENOUS | Status: AC
Start: 2022-07-16 — End: 2022-07-16
  Administered 2022-07-16: 1000 mg via INTRAVENOUS
  Filled 2022-07-16: qty 200

## 2022-07-16 MED ORDER — LACTATED RINGERS IV SOLN: INTRAVENOUS | Status: DC

## 2022-07-16 MED ORDER — ONDANSETRON HCL 4 MG PO TABS: 4.0000 mg | ORAL_TABLET | Freq: Four times a day (QID) | ORAL | Status: DC | PRN

## 2022-07-16 MED ORDER — SODIUM CHLORIDE 0.9 % IV SOLN
1.0000 g | INTRAVENOUS | Status: DC
  Filled 2022-07-16: qty 10

## 2022-07-16 MED ORDER — METRONIDAZOLE 500 MG/100ML IV SOLN
500.0000 mg | Freq: Once | INTRAVENOUS | Status: AC
Start: 2022-07-16 — End: 2022-07-16
  Administered 2022-07-16: 500 mg via INTRAVENOUS
  Filled 2022-07-16: qty 100

## 2022-07-16 MED ORDER — LACTATED RINGERS IV BOLUS (SEPSIS)
250.0000 mL | Freq: Once | INTRAVENOUS | Status: AC
  Administered 2022-07-16: 250 mL via INTRAVENOUS

## 2022-07-16 MED ORDER — PANTOPRAZOLE SODIUM 40 MG PO TBEC
40.0000 mg | DELAYED_RELEASE_TABLET | Freq: Every day | ORAL | Status: DC
  Administered 2022-07-16 – 2022-07-17 (×2): 40 mg via ORAL
  Filled 2022-07-16 (×2): qty 1

## 2022-07-16 MED ORDER — LEVOTHYROXINE SODIUM 25 MCG PO TABS
25.0000 ug | ORAL_TABLET | Freq: Every day | ORAL | Status: DC
  Administered 2022-07-16 – 2022-07-17 (×2): 25 ug via ORAL
  Filled 2022-07-16 (×3): qty 1

## 2022-07-16 MED ORDER — VANCOMYCIN HCL 500 MG/100ML IV SOLN
500.0000 mg | INTRAVENOUS | Status: DC
Start: 2022-07-18 — End: 2022-07-16

## 2022-07-16 MED ORDER — DULOXETINE HCL 60 MG PO CPEP
120.0000 mg | ORAL_CAPSULE | Freq: Every day | ORAL | Status: DC
  Administered 2022-07-16 – 2022-07-17 (×2): 120 mg via ORAL
  Filled 2022-07-16 (×2): qty 2

## 2022-07-16 MED ORDER — LACTATED RINGERS IV BOLUS (SEPSIS)
1000.0000 mL | Freq: Once | INTRAVENOUS | Status: AC
  Administered 2022-07-16: 1000 mL via INTRAVENOUS

## 2022-07-16 MED ORDER — ALPRAZOLAM 0.25 MG PO TABS
0.2500 mg | ORAL_TABLET | Freq: Every day | ORAL | Status: DC | PRN
  Administered 2022-07-18: 0.25 mg via ORAL
  Filled 2022-07-16: qty 1

## 2022-07-16 NOTE — Sepsis Progress Note (Signed)
Code Sepsis protocol being monitored by eLink. 

## 2022-07-16 NOTE — Progress Notes (Signed)
Pharmacy Antibiotic Note  [name] [name] is a 80 y.o. female admitted on 07/14/2022 with  unknown source of infection .  Pharmacy has been consulted for vancomycin and cefepime dosing.  Plan: Vancomycin 1000 mg IV x 1 dose. Vancomycin 500 mg IV every 48 hours. Cefepime 2000 mg IV x 1 dose Cefepime 1000 mg IV every 24 hours. Monitor labs, c/s, and vanco level as indicated.  Height: '4\' 8"'$  (142.2 cm) Weight: 51.3 kg (113 lb) IBW/kg (Calculated) : 36.3  Temp (24hrs), Avg:98.4 F (36.9 C), Min:98.4 F (36.9 C), Max:98.4 F (36.9 C)  Recent Labs  Lab 07/28/2022 1014  WBC 28.9*  CREATININE 3.87*  LATICACIDVEN 1.5    Estimated Creatinine Clearance: 8 mL/min (A) (by C-G formula based on SCr of 3.87 mg/dL (H)).    Allergies  Allergen Reactions   Hydrocodone-Acetaminophen Hives and Itching    Antimicrobials this admission: Vanco 9/4 >> Cefepime 9/4 >>  Microbiology results: 9/4 BCx: pending 9/4 UCx: pending    Thank you for allowing pharmacy to be a part of this patient's care.  Margot Ables, PharmD Clinical Pharmacist 07/15/2022 11:15 AM

## 2022-07-16 NOTE — ED Provider Notes (Signed)
[facility] EMERGENCY DEPARTMENT Provider Note   CSN: [id number] Arrival date & time: 08/04/2022  1497     History  Chief Complaint  Patient presents with   [name] [name] is a 81 y.o. female.  Patient brought in by EMS complains of weakness for the past few days had a fall this morning.  Complaining of hip pain.  Also complains of some cough and's and sore throat.  Those all started on Friday.  Patient's family is concerned about UTI or COVID.  Patient a little disoriented to time.  Patient does not use oxygen at home arrived here hypoxic blood pressures had dropped down to around 88 systolic.  But mostly right around 90.  Patient able to follow commands and verbalizing no leg swelling.  Past medical history significant for dyslipidemia bipolar affect of disorder fibromyalgia history of hysterectomy hyperlipidemia chronic fatigue hypertension patient does take blood pressure medicine.  This could explain the hypotension.  Patient had a cardiac catheterization in 2016.  Patient never smoked.       Home Medications Prior to Admission medications   Medication Sig Start Date End Date Taking? Authorizing Provider  ALPRAZolam Duanne Moron) 0.5 MG tablet Take 0.25-0.5 mg by mouth daily as needed for anxiety.  12/15/13   [provider]  aspirin EC 81 MG tablet Take 81 mg by mouth daily. Swallow whole.    [provider]  atorvastatin (LIPITOR) 40 MG tablet Take 1 tablet (40 mg total) by mouth daily. 05/03/22   Paseda, Dewaine Conger, FNP  bisacodyl (DULCOLAX) 5 MG EC tablet Take 1 tablet by mouth daily as needed for mild constipation (constipation).     [provider]  Cholecalciferol (VITAMIN D) 50 MCG (2000 UT) CAPS Take 2,000 Units by mouth daily.    [provider]  DULoxetine (CYMBALTA) 60 MG capsule Take 120 mg by mouth daily.    [provider]  estradiol (ESTRACE) 1 MG tablet TAKE 1/2 TABLET BY MOUTH DAILY. 06/27/22   Lindell Spar, MD   gabapentin (NEURONTIN) 100 MG capsule Take 100 mg by mouth 2 (two) times daily as needed (pain). 03/04/21   [provider]  lamoTRIgine (LAMICTAL) 150 MG tablet Take 150 mg by mouth at bedtime.    [provider]  levothyroxine (SYNTHROID) 25 MCG tablet Take 1 tablet (25 mcg total) by mouth daily. 12/13/21   Paseda, Dewaine Conger, FNP  losartan (COZAAR) 50 MG tablet TAKE (1) TABLET BY MOUTH ONCE DAILY. 06/27/22   Fay Records, MD  meloxicam (MOBIC) 7.5 MG tablet TAKE 1 TABLET BY MOUTH TWICE DAILY AS NEEDED FOR PAIN. 01/31/22   Aundra Dubin, PA-C  Multiple Vitamin (MULTIVITAMIN) tablet Take 1 tablet by mouth daily.    [provider]  niacin (SLO-NIACIN) 500 MG tablet Take 500 mg by mouth daily.    [provider]  nitroGLYCERIN (NITROSTAT) 0.4 MG SL tablet PLACE 1 TAB UNDER TONGUE EVERY 5 MIN IF NEEDED FOR CHEST PAIN. MAY USE 3 TIMES.NO RELIEF CALL 911. 11/24/21   Fay Records, MD  Omega-3 Fatty Acids (FISH OIL) 1000 MG CAPS Take 1,000 mg by mouth daily.    [provider]  pantoprazole (PROTONIX) 40 MG tablet Take 1 tablet (40 mg total) by mouth daily. 07/06/22   Daneil Dolin, MD  polyethylene glycol-electrolytes (NULYTELY) 420 g solution As directed 05/28/22   Rourk, Cristopher Estimable, MD  Probiotic Product (PROBIOTIC DAILY PO) Take 1 tablet by mouth  daily.     [provider]  Propylene Glycol (SYSTANE BALANCE) 0.6 % SOLN Place 1 drop into both eyes 2 (two) times daily as needed (dry eyes).    [provider]  risperiDONE (RISPERDAL) 1 MG tablet Take 0.5 mg by mouth at bedtime.    [provider]  WELLBUTRIN XL 300 MG 24 hr tablet Take 300 mg by mouth daily.  07/08/14   [provider]      Allergies    Hydrocodone-acetaminophen    Review of Systems   Review of Systems  Constitutional:  Positive for fatigue. Negative for chills and fever.  HENT:  Positive for sore throat. Negative for ear pain.   Eyes:  Negative for  pain and visual disturbance.  Respiratory:  Positive for cough. Negative for shortness of breath.   Cardiovascular:  Negative for chest pain and palpitations.  Gastrointestinal:  Negative for abdominal pain and vomiting.  Genitourinary:  Negative for dysuria and hematuria.  Musculoskeletal:  Negative for arthralgias and back pain.  Skin:  Negative for color change and rash.  Neurological:  Positive for weakness. Negative for seizures and syncope.  All other systems reviewed and are negative.   Physical Exam Updated Vital Signs BP (!) 92/56   Pulse (!) 110   Temp 98.4 F (36.9 C) (Oral)   Resp (!) 27   Ht 1.422 m ('4\' 8"'$ )   Wt 51.3 kg   LMP 02/10/1993   SpO2 97%   BMI 25.33 kg/m  Physical Exam Vitals and nursing note reviewed.  Constitutional:      General: She is in acute distress.     Appearance: Normal appearance. She is well-developed.  HENT:     Head: Normocephalic and atraumatic.     Mouth/Throat:     Mouth: Mucous membranes are dry.     Pharynx: No oropharyngeal exudate or posterior oropharyngeal erythema.  Eyes:     Extraocular Movements: Extraocular movements intact.     Conjunctiva/sclera: Conjunctivae normal.     Pupils: Pupils are equal, round, and reactive to light.  Cardiovascular:     Rate and Rhythm: Normal rate and regular rhythm.     Heart sounds: No murmur heard. Pulmonary:     Effort: Pulmonary effort is normal. No respiratory distress.     Breath sounds: Normal breath sounds. No wheezing, rhonchi or rales.  Abdominal:     Palpations: Abdomen is soft.     Tenderness: There is no abdominal tenderness.  Musculoskeletal:        General: No swelling.     Cervical back: Normal range of motion and neck supple. No rigidity.     Right lower leg: No edema.     Left lower leg: No edema.  Skin:    General: Skin is warm and dry.     Capillary Refill: Capillary refill takes less than 2 seconds.  Neurological:     General: No focal deficit present.      Mental Status: She is alert.  Psychiatric:        Mood and Affect: Mood normal.     ED Results / Procedures / Treatments   Labs (all labs ordered are listed, but only abnormal results are displayed) Labs Reviewed  CBG MONITORING, ED - Abnormal; Notable for the following components:      Result Value   Glucose-Capillary 69 (*)    All other components within normal limits  RESP PANEL BY RT-PCR (FLU A&B, COVID) ARPGX2  CULTURE, BLOOD (  ROUTINE X 2)  CULTURE, BLOOD (ROUTINE X 2)  URINE CULTURE  LACTIC ACID, PLASMA  LACTIC ACID, PLASMA  COMPREHENSIVE METABOLIC PANEL  CBC WITH DIFFERENTIAL/PLATELET  PROTIME-INR  APTT  URINALYSIS, ROUTINE W REFLEX MICROSCOPIC    EKG EKG Interpretation  Date/Time:  Monday July 16 2022 09:35:44 EDT Ventricular Rate:  110 PR Interval:  152 QRS Duration: 97 QT Interval:  329 QTC Calculation: 445 R Axis:   75 Text Interpretation: Sinus tachycardia Confirmed by Fredia Sorrow [phone]) on 07/20/2022 10:12:45 AM  Radiology No results found.  Procedures Procedures    Medications Ordered in ED Medications  lactated ringers infusion (has no administration in time range)  lactated ringers bolus 1,000 mL (has no administration in time range)    And  lactated ringers bolus 500 mL (has no administration in time range)    And  lactated ringers bolus 250 mL (has no administration in time range)  ceFEPIme (MAXIPIME) 2 g in sodium chloride 0.9 % 100 mL IVPB (has no administration in time range)  metroNIDAZOLE (FLAGYL) IVPB 500 mg (has no administration in time range)  vancomycin (VANCOCIN) IVPB 1000 mg/200 mL premix (has no administration in time range)    ED Course/ Medical Decision Making/ A&P                           Medical Decision Making Amount and/or Complexity of Data Reviewed Labs: ordered. Radiology: ordered. ECG/medicine tests: ordered.  Risk Prescription drug management. Decision regarding hospitalization.   CRITICAL  CARE Performed by: Fredia Sorrow Total critical care time: 60 minutes Critical care time was exclusive of separately billable procedures and treating other patients. Critical care was necessary to treat or prevent imminent or life-threatening deterioration. Critical care was time spent personally by me on the following activities: development of treatment plan with patient and/or surrogate as well as nursing, discussions with consultants, evaluation of patient's response to treatment, examination of patient, obtaining history from patient or surrogate, ordering and performing treatments and interventions, ordering and review of laboratory studies, ordering and review of radiographic studies, pulse oximetry and re-evaluation of patient's condition.  Patient brought in following a fall.  No injuries from the fall CT head negative x-rays of both hips and pelvis without any acute injury.  But we noted here the patient was hypotensive and hypoxic.  Patient not normally on home oxygen.  Patient's blood pressure upon arrival would drop down to 88%.  Little tachycardic no fever.  Work-up included beginning of sepsis protocol broad-spectrum antibiotics.  COVID-negative blood cultures pending first lactic acid normal which is reassuring however the white count was markedly high at 28.9.  Urinalysis consistent with urinary tract infection with greater than 50 white blood cells and bacteria.  And also electrolytes significant for hyponatremia potassium was normal sodium was 124.  Renal function consistent with acute kidney injury had normal renal function in the mid part of August.  Patient chest x-ray shows a right hilar mass.  Clinically cannot completely rule out PE she is tachycardic was hypoxic.  But obviously cannot have a CT angio.  Will discuss with hospitalist whether they want to do VQ scan but also could just get CT angio once renal function improves.  In addition patient with acute kidney injury and  she states that she does take blood pressure medicines.  That could also be responsible for the low blood pressure.  Patient states that she is on Cozaar.  Patient not  on any blood thinners. Final Clinical Impression(s) / ED Diagnoses Final diagnoses:  Fall, initial encounter  Acute respiratory failure with hypoxia (HCC)  Sepsis, due to unspecified organism, unspecified whether acute organ dysfunction present [facility])    Rx / DC Orders ED Discharge Orders     None         Fredia Sorrow, MD 08/06/2022 1157

## 2022-07-16 NOTE — H&P (Signed)
History and Physical    [name] [name] XFG:[id number] DOB: 06/05/1941 DOA: 08/11/2022  PCP: Renee Rival, FNP  Patient coming from: Home  I have personally briefly reviewed patient's old medical records in [place]  Chief Complaint: Generalized weakness and fall  HPI: [name] [name] is a 81 y.o. female with medical history significant of hypertension, bipolar disorder, hyperlipidemia, hypothyroidism who lives at home, has been experiencing generalized weakness for the past week.  Last night, she suffered a fall.  Cannot tell me the circumstances around the fall.  She does complain of some cough and sore throat.  She has not had any nausea or vomiting.  She has not had any diarrhea.  Denies any dysuria.  She has chronic back pain which she reported is unchanged.  On arrival to the emergency room, noted to be mildly hypoxic and hypotensive.  Supplemental oxygen was applied and she was started on IV fluids.  She is also noted to be hypoglycemic.  Urinalysis indicates possible infection.  Further imaging also indicates possible bilateral pneumonia.  She has been referred for admission.  Review of Systems: As per HPI otherwise 10 point review of systems negative.    Past Medical History:  Diagnosis Date   Anemia    Anxiety    Arthritis    BIPOLAR AFFECTIVE DISORDER 05/11/2009   Qualifier: Diagnosis of  By: Orville Govern, CMA, Carol     Bipolar affective disorder [facility])    Bipolar disorder ([place])    Cataract    Chest pain 05/11/2009   Qualifier: Diagnosis of  By: Orville Govern, CMA, Carol     Chronic back pain    Chronic cough 11/23/2014   Chronic fatigue    Chronic pain syndrome 9/67/8938   Complication of anesthesia    Constipation    Depression    Difficulty in walking(719.7) 07/26/2011   Dizziness    Dyslipidemia    Fecal incontinence 11/23/2014   Fibromyalgia    GERD (gastroesophageal reflux disease)    H/O anxiety disorder 03/05/2013   Overview:  Heart Cath x 2 per patient; most  recent 4 yrs ago with "negative" findings per patient; Cardiologist, Dr Dorris Carnes following; last seen 10/2011; most recent episode 11/2012 with admit [facility]) and negative work-up per patient    H/O: hysterectomy    History of back surgery 03/05/2013   History of gastroesophageal reflux (GERD) 03/05/2013   History of kidney stones    Hx of adenomatous colonic polyps 2005   Hyperlipidemia    Hypertension    Hypothyroidism    Knee pain    Midsternal chest pain 08/19/2015   Muscle weakness (generalized) 04/27/2013   Non-obstructive CAD    a. 08/2015 Cath: LM 40, LCX small, 30ost, 29m OM2 small, RCA nl/small, EF 55-65%.   Osteopenia    PONV (postoperative nausea and vomiting)    Primary osteoarthritis of left knee 04/17/2020   Scalp laceration 03/01/2020   Sciatica     Past Surgical History:  Procedure Laterality Date   ABDOMINAL HYSTERECTOMY  1994   TAH- DUB, BSO   [place] bunionectomy     bilateral   BIOPSY  07/06/2022   Procedure: BIOPSY;  Surgeon: RDaneil Dolin MD;  [facility];  Service: Endoscopy;;   BREAST BIOPSY Left 1989   BREAST LUMPECTOMY     BREAST SURGERY N/A    Phreesia 09/14/2020   BUNIONECTOMY Left 2005   CARDIAC CATHETERIZATION  2010   +CAD   CARDIAC  CATHETERIZATION N/A 08/19/2015   Procedure: Left Heart Cath and Coronary Angiography;  Surgeon: Belva Crome, MD;  Location: [place] [facility];  Service: Cardiovascular;  Laterality: N/A;   COLONOSCOPY  01/29/2007   UTM:LYYTKP rectum, left-sided diverticula, 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  Remainder of colonic mucosa was normal   COLONOSCOPY WITH ESOPHAGOGASTRODUODENOSCOPY (EGD) N/A 02/01/2014   TWS:FKCL erosive reflux/gastric polyp/normal rectum/scattered left sided diverticula, normal distal TI. gastric bx negative, fundic gland polyp. next TCS 01/2019.   COLONOSCOPY WITH PROPOFOL N/A 07/06/2022   Procedure: COLONOSCOPY WITH PROPOFOL;  Surgeon: Daneil Dolin, MD;   [facility];  Service: Endoscopy;  Laterality: N/A;  10:30am   CYSTECTOMY     pilonidial cyst   ESOPHAGOGASTRODUODENOSCOPY     followed by colonoscopy with snare polypectomy   ESOPHAGOGASTRODUODENOSCOPY (EGD) WITH PROPOFOL N/A 07/06/2022   Procedure: ESOPHAGOGASTRODUODENOSCOPY (EGD) WITH PROPOFOL;  Surgeon: Daneil Dolin, MD;  [facility];  Service: Endoscopy;  Laterality: N/A;   EYE SURGERY Bilateral    cataract surgery with lens implants   JOINT REPLACEMENT N/A    Phreesia 09/14/2020   MALONEY DILATION N/A 07/06/2022   Procedure: Venia Minks DILATION;  Surgeon: Daneil Dolin, MD;  [facility];  Service: Endoscopy;  Laterality: N/A;   REVISION TOTAL HIP ARTHROPLASTY Right    right knee replacement  02/2013   SHOULDER ARTHROSCOPY Left 2005   SHOULDER ARTHROSCOPY Right    SPINAL FUSION     SPINAL FUSION  2004   L4-L5   SPINE SURGERY N/A    Phreesia 09/14/2020   TOTAL HIP ARTHROPLASTY Right 06/2006   TOTAL HIP ARTHROPLASTY  8/12   TOTAL KNEE ARTHROPLASTY Left 04/18/2020   TOTAL KNEE ARTHROPLASTY Left 04/18/2020   Procedure: LEFT TOTAL KNEE ARTHROPLASTY;  Surgeon: Leandrew Koyanagi, MD;  Location: [place];  Service: Orthopedics;  Laterality: Left;   TOTAL SHOULDER REPLACEMENT Left 08/2005    Social History:  reports that she has never smoked. She has never used smokeless tobacco. She reports current alcohol use of about 5.0 standard drinks of alcohol per week. She reports that she does not use drugs.  Allergies  Allergen Reactions   Hydrocodone-Acetaminophen Hives and Itching    Family History  Problem Relation Age of Onset   Heart disease Mother    Heart attack Mother    Heart disease Father    Heart attack Father    Stroke Sister    Heart disease Sister        CABG   Stomach cancer Maternal Aunt    Breast cancer Maternal Aunt    Stroke Maternal Grandfather    Cancer Paternal Grandmother        unknown primary   Colon cancer Neg Hx     Cervical cancer Neg Hx      Prior to Admission medications   Medication Sig Start Date End Date Taking? Authorizing Provider  ALPRAZolam Duanne Moron) 0.5 MG tablet Take 0.25-0.5 mg by mouth daily as needed for anxiety.  12/15/13  Yes [provider]  aspirin EC 81 MG tablet Take 81 mg by mouth daily. Swallow whole.   Yes [provider]  atorvastatin (LIPITOR) 40 MG tablet Take 1 tablet (40 mg total) by mouth daily. 05/03/22  Yes Paseda, Dewaine Conger, FNP  bisacodyl (DULCOLAX) 5 MG EC tablet Take 1 tablet by mouth daily as needed for mild constipation (constipation).    Yes [provider]  Cholecalciferol (VITAMIN D)  50 MCG (2000 UT) CAPS Take 2,000 Units by mouth daily.   Yes [provider]  DULoxetine (CYMBALTA) 60 MG capsule Take 120 mg by mouth daily.   Yes [provider]  estradiol (ESTRACE) 1 MG tablet TAKE 1/2 TABLET BY MOUTH DAILY. 06/27/22  Yes Lindell Spar, MD  gabapentin (NEURONTIN) 100 MG capsule Take 100 mg by mouth 2 (two) times daily as needed (pain). 03/04/21  Yes [provider]  lamoTRIgine (LAMICTAL) 150 MG tablet Take 150 mg by mouth at bedtime.   Yes [provider]  levothyroxine (SYNTHROID) 25 MCG tablet Take 1 tablet (25 mcg total) by mouth daily. 12/13/21  Yes Paseda, Dewaine Conger, FNP  losartan (COZAAR) 50 MG tablet TAKE (1) TABLET BY MOUTH ONCE DAILY. 06/27/22  Yes Fay Records, MD  meloxicam (MOBIC) 7.5 MG tablet TAKE 1 TABLET BY MOUTH TWICE DAILY AS NEEDED FOR PAIN. 01/31/22  Yes Aundra Dubin, PA-C  Multiple Vitamin (MULTIVITAMIN) tablet Take 1 tablet by mouth daily.   Yes [provider]  niacin (SLO-NIACIN) 500 MG tablet Take 500 mg by mouth daily.   Yes [provider]  nitroGLYCERIN (NITROSTAT) 0.4 MG SL tablet PLACE 1 TAB UNDER TONGUE EVERY 5 MIN IF NEEDED FOR CHEST PAIN. MAY USE 3 TIMES.NO RELIEF CALL 911. 11/24/21  Yes Fay Records, MD  Omega-3 Fatty Acids (FISH OIL) 1000 MG CAPS Take  1,000 mg by mouth daily.   Yes [provider]  pantoprazole (PROTONIX) 40 MG tablet Take 1 tablet (40 mg total) by mouth daily. 07/06/22  Yes Rourk, Cristopher Estimable, MD  Probiotic Product (PROBIOTIC DAILY PO) Take 1 tablet by mouth daily.    Yes [provider]  Propylene Glycol (SYSTANE BALANCE) 0.6 % SOLN Place 1 drop into both eyes 2 (two) times daily as needed (dry eyes).   Yes [provider]  risperiDONE (RISPERDAL) 1 MG tablet Take 1 mg by mouth at bedtime.   Yes [provider]  WELLBUTRIN XL 300 MG 24 hr tablet Take 300 mg by mouth daily.  07/08/14  Yes [provider]  polyethylene glycol-electrolytes (NULYTELY) 420 g solution As directed Patient not taking: Reported on 08/03/2022 05/28/22   Daneil Dolin, MD    Physical Exam: Vitals:   08/11/2022 1349 07/22/2022 1400 08/09/2022 1500 08/09/2022 1600  BP:  (!) 104/55 119/60 112/63  Pulse:    (!) 108  Resp: '17 19 18 16  '$ Temp:    97.6 F (36.4 C)  TempSrc:      SpO2: 99% 97%  95%  Weight:      Height:        Constitutional: NAD, calm, comfortable Eyes: PERRL, lids and conjunctivae normal ENMT: Mucous membranes are dry.  Pharyngeal erythema noted.Normal dentition.  Neck: normal, supple, no masses, no thyromegaly Respiratory: Coarse breath sounds at bases. Normal respiratory effort. No accessory muscle use.  Cardiovascular: Regular rate and rhythm, no murmurs / rubs / gallops. No extremity edema. 2+ pedal pulses. No carotid bruits.  Abdomen: no tenderness, no masses palpated. No hepatosplenomegaly. Bowel sounds positive.  Musculoskeletal: no clubbing / cyanosis. No joint deformity upper and lower extremities. Good ROM, no contractures. Normal muscle tone.  Skin: no rashes, lesions, ulcers. No induration Neurologic: CN 2-12 grossly intact. Sensation intact, DTR normal. Strength 5/5 in all 4.  Psychiatric: Normal judgment and insight. Alert and oriented x 3. Normal mood.    Labs on Admission: I have  personally reviewed following labs and  imaging studies  CBC: Recent Labs  Lab 07/20/2022 1014  WBC 28.9*  NEUTROABS 27.7*  HGB 10.9*  HCT 31.8*  MCV 96.7  PLT 615   Basic Metabolic Panel: Recent Labs  Lab 08/02/2022 1014  NA 124*  K 4.0  CL 90*  CO2 24  GLUCOSE 79  BUN 47*  CREATININE 3.87*  CALCIUM 8.3*   GFR: Estimated Creatinine Clearance: 8.3 mL/min (A) (by C-G formula based on SCr of 3.87 mg/dL (H)). Liver Function Tests: Recent Labs  Lab 07/20/2022 1014  AST 28  ALT 25  ALKPHOS 136*  BILITOT 0.6  PROT 5.7*  ALBUMIN 2.6*   No results for input(s): "LIPASE", "AMYLASE" in the last 168 hours. No results for input(s): "AMMONIA" in the last 168 hours. Coagulation Profile: Recent Labs  Lab 07/13/2022 1014  INR 1.1   Cardiac Enzymes: No results for input(s): "CKTOTAL", "CKMB", "CKMBINDEX", "TROPONINI" in the last 168 hours. BNP (last 3 results) No results for input(s): "PROBNP" in the last 8760 hours. HbA1C: No results for input(s): "HGBA1C" in the last 72 hours. CBG: Recent Labs  Lab 07/18/2022 0936  GLUCAP 69*   Lipid Profile: No results for input(s): "CHOL", "HDL", "LDLCALC", "TRIG", "CHOLHDL", "LDLDIRECT" in the last 72 hours. Thyroid Function Tests: No results for input(s): "TSH", "T4TOTAL", "FREET4", "T3FREE", "THYROIDAB" in the last 72 hours. Anemia Panel: No results for input(s): "VITAMINB12", "FOLATE", "FERRITIN", "TIBC", "IRON", "RETICCTPCT" in the last 72 hours. Urine analysis:    Component Value Date/Time   COLORURINE AMBER (A) 07/31/2022 1014   APPEARANCEUR CLOUDY (A) 07/15/2022 1014   LABSPEC 1.011 08/09/2022 1014   PHURINE 5.0 08/09/2022 1014   GLUCOSEU NEGATIVE 08/05/2022 1014   HGBUR MODERATE (A) 07/15/2022 1014   BILIRUBINUR NEGATIVE 07/27/2022 1014   BILIRUBINUR negative 07/11/2020 1211   BILIRUBINUR n 08/07/2016 1256   KETONESUR NEGATIVE 07/15/2022 1014   PROTEINUR 100 (A) 07/17/2022 1014   UROBILINOGEN 0.2 07/11/2020 1211    UROBILINOGEN 0.2 09/15/2007 1853   NITRITE NEGATIVE 08/10/2022 1014   LEUKOCYTESUR LARGE (A) 08/02/2022 1014    Radiological Exams on Admission: CT RENAL STONE STUDY  Result Date: 07/25/2022 CLINICAL DATA:  Flank pain, abdominal pain EXAM: CT ABDOMEN AND PELVIS WITHOUT CONTRAST TECHNIQUE: Multidetector CT imaging of the abdomen and pelvis was performed following the standard protocol without IV contrast. RADIATION DOSE REDUCTION: This exam was performed according to the departmental dose-optimization program which includes automated exposure control, adjustment of the mA and/or kV according to patient size and/or use of iterative reconstruction technique. COMPARISON:  07/12/2020 FINDINGS: Lower chest: There are linear patchy infiltrates in both lower lung fields, more so on the left side suggesting atelectasis/pneumonia. Minimal bilateral pleural effusions are seen. Coronary artery calcifications are seen. Hepatobiliary: No focal abnormalities are seen. There is no dilation of bile ducts. Gallbladder is distended. There is no wall thickening in gallbladder. Pancreas: Pancreas appears smaller than usual in size. No focal abnormalities are seen. Spleen: Unremarkable. Adrenals/Urinary Tract: Adrenals are unremarkable. There is no hydronephrosis. There are no renal or ureteral stones. Beam hardening artifacts from surgical hardware in the lumbar spine limited evaluation of the kidneys. Ureters are not dilated. Urinary bladder is not distended. Evaluation of bladder is limited by severe beam hardening artifact. Stomach/Bowel: Small hiatal hernia is seen. There is no significant small bowel dilation. Appendix is not seen. There is no significant wall thickening in colon. Diverticula are seen in the colon without signs of focal diverticulitis. Vascular/Lymphatic: Scattered arterial calcifications are seen. Reproductive: Evaluation  is limited by severe beam hardening artifact. Uterus is not seen. Other: There is no  ascites or pneumoperitoneum. There is mild stranding in the mesentery without any loculated fluid collections or significant lymphadenopathy. Musculoskeletal: There is previous arthroplasty in both hips. There is posterior fusion at the L4-L5 level in lumbar spine. Degenerative changes are noted in lower thoracic spine and lumbar spine. There is significant interval worsening of degenerative changes at T12-L1 level with erosive changes in the adjacent endplates. No definite loculated paraspinal fluid collections are seen. IMPRESSION: Beam hardening artifacts from surgical hardware in lumbar spine and both hips limit the study. There is no hydronephrosis. There are no renal or ureteral stones. There is no evidence of intestinal obstruction or pneumoperitoneum. There is significant interval worsening of degenerative changes at T12-L1 level. Possibility of discitis is not excluded. Please correlate with clinical symptoms and laboratory findings. If there is clinical suspicion for discitis, follow-up MRI may be considered. Small bilateral pleural effusions, more so on the left side. There are patchy infiltrates in both lower lung fields suggesting atelectasis/pneumonia. Small hiatal hernia. Diverticulosis of colon. There is mild stranding in the mesentery without any loculated fluid collections or significant lymphadenopathy. This may be a normal variation or suggest nonspecific mesenteric inflammation. Electronically Signed   By: Elmer Picker M.D.   On: 08/06/2022 13:32   DG Hips Bilat W or Wo Pelvis 3-4 Views  Result Date: 07/22/2022 CLINICAL DATA:  Fall. EXAM: DG HIP (WITH OR WITHOUT PELVIS) 3-4V BILAT COMPARISON:  October 11, 2021. FINDINGS: Status post bilateral total hip arthroplasties. Sacroiliac joints are unremarkable. No fracture or dislocation is noted. IMPRESSION: No acute abnormality is noted. Electronically Signed   By: Marijo Conception M.D.   On: 07/13/2022 11:44   DG Chest Port 1  View  Result Date: 07/29/2022 CLINICAL DATA:  Sepsis. EXAM: PORTABLE CHEST 1 VIEW COMPARISON:  April 14, 2020. FINDINGS: Normal cardiac size. Right paratracheal prominence is noted concerning for adenopathy or mass. Status post left shoulder arthroplasty. Severe degenerative changes seen involving the right shoulder. Lungs are clear. IMPRESSION: Right paratracheal prominence is concerning for mass or adenopathy. CT scan of the chest with intravenous contrast is recommended for further evaluation. Electronically Signed   By: Marijo Conception M.D.   On: 08/04/2022 11:43   CT Head Wo Contrast  Result Date: 08/09/2022 CLINICAL DATA:  Increasing weakness.  Fall. EXAM: CT HEAD WITHOUT CONTRAST TECHNIQUE: Contiguous axial images were obtained from the base of the skull through the vertex without intravenous contrast. RADIATION DOSE REDUCTION: This exam was performed according to the departmental dose-optimization program which includes automated exposure control, adjustment of the mA and/or kV according to patient size and/or use of iterative reconstruction technique. COMPARISON:  04/17/2020 FINDINGS: Brain: There is no evidence for acute hemorrhage, hydrocephalus, mass lesion, or abnormal extra-axial fluid collection. No definite CT evidence for acute infarction. Diffuse loss of parenchymal volume is consistent with atrophy. Patchy low attenuation in the deep hemispheric and periventricular white matter is nonspecific, but likely reflects chronic microvascular ischemic demyelination. Vascular: No hyperdense vessel or unexpected calcification. Skull: Normal. Negative for fracture or focal lesion. Sinuses/Orbits: No acute finding. Other: None. IMPRESSION: 1. No acute intracranial abnormality. 2. Atrophy with chronic small vessel ischemic disease. Electronically Signed   By: Misty Stanley M.D.   On: 07/29/2022 11:19    EKG: Independently reviewed.  Sinus tachycardia without ischemic changes  Assessment/Plan Principal  Problem:   Sepsis ([place]) Active Problems:   Hyperlipidemia  HTN (hypertension)   Bipolar disorder ([facility])   Pneumonia   UTI (urinary tract infection)   AKI (acute kidney injury) ([place])   Hyponatremia     Sepsis -Likely secondary to pneumonia versus UTI -On admission, she was noted to be tachycardic with leukocytosis and acute kidney injury -We will start on ceftriaxone and azithromycin -Blood cultures and urine cultures in process -Check sputum culture and urinary antigens -Continue pulmonary hygiene -Speech therapy evaluation  Acute kidney injury -Hold further ARB -No evidence of hydronephrosis on CT imaging -Continue IV fluids and monitor urine output  Hyponatremia -Likely related to diminished p.o. intake -Continue on saline infusion  Right paratracheal prominence -We will need further imaging with CT chest with IV contrast once renal function has recovered  Bipolar disorder -Continue home regimen  Hyperlipidemia -Continue statin  Hypertension -Holding antihypertensives for now while blood pressures are soft  Hypothyroidism -Continue Synthroid  DVT prophylaxis: heparin  Code Status: full code  Family Communication: No family present Disposition Plan: discharge once sepsis physiology improved  Consults called:   Admission status: inpatient, stepdown   Kathie Dike MD [facility]   If 7PM-7AM, please contact night-coverage [email or web address]   08/11/2022, 4:45 PM

## 2022-07-16 NOTE — ED Triage Notes (Signed)
Pt to the ED via [facility] with complaints of weakness for the past few days. Pt fell this morning with no new injuries. Pt complains of a cough and sore throat since Friday of last week but has not taken a covid test.  Pt is disoriented to time only.  Family is concerned the patient may have a UTI  or Covid.

## 2022-07-17 DIAGNOSIS — J9601 Acute respiratory failure with hypoxia: Secondary | ICD-10-CM | POA: Diagnosis not present

## 2022-07-17 DIAGNOSIS — N179 Acute kidney failure, unspecified: Secondary | ICD-10-CM | POA: Diagnosis not present

## 2022-07-17 DIAGNOSIS — A419 Sepsis, unspecified organism: Secondary | ICD-10-CM | POA: Diagnosis not present

## 2022-07-17 DIAGNOSIS — N3000 Acute cystitis without hematuria: Secondary | ICD-10-CM | POA: Diagnosis not present

## 2022-07-17 LAB — BLOOD CULTURE ID PANEL (REFLEXED) - BCID2

## 2022-07-17 LAB — COMPREHENSIVE METABOLIC PANEL
ALT: 21 U/L (ref 0–44)
AST: 20 U/L (ref 15–41)
Albumin: 2 g/dL — ABNORMAL LOW (ref 3.5–5.0)
Alkaline Phosphatase: 132 U/L — ABNORMAL HIGH (ref 38–126)
Anion gap: 7 (ref 5–15)
BUN: 49 mg/dL — ABNORMAL HIGH (ref 8–23)
CO2: 21 mmol/L — ABNORMAL LOW (ref 22–32)
Calcium: 7.5 mg/dL — ABNORMAL LOW (ref 8.9–10.3)
Chloride: 95 mmol/L — ABNORMAL LOW (ref 98–111)
Creatinine, Ser: 4.15 mg/dL — ABNORMAL HIGH (ref 0.44–1.00)
GFR, Estimated: 10 mL/min — ABNORMAL LOW (ref >60)
Glucose, Bld: 58 mg/dL — ABNORMAL LOW (ref 70–99)
Potassium: 4.4 mmol/L (ref 3.5–5.1)
Sodium: 123 mmol/L — ABNORMAL LOW (ref 135–145)
Total Bilirubin: 0.6 mg/dL (ref 0.3–1.2)
Total Protein: 4.8 g/dL — ABNORMAL LOW (ref 6.5–8.1)

## 2022-07-17 LAB — CBC
HCT: 28.7 % — ABNORMAL LOW (ref 36.0–46.0)
Hemoglobin: 9.6 g/dL — ABNORMAL LOW (ref 12.0–15.0)
MCH: 32.9 pg (ref 26.0–34.0)
MCHC: 33.4 g/dL (ref 30.0–36.0)
MCV: 98.3 fL (ref 80.0–100.0)
Platelets: 151 10*3/uL (ref 150–400)
RBC: 2.92 MIL/uL — ABNORMAL LOW (ref 3.87–5.11)
RDW: 14.3 % (ref 11.5–15.5)
WBC: 25.8 10*3/uL — ABNORMAL HIGH (ref 4.0–10.5)
nRBC: 0 % (ref 0.0–0.2)

## 2022-07-17 LAB — BASIC METABOLIC PANEL
Anion gap: 9 (ref 5–15)
BUN: 51 mg/dL — ABNORMAL HIGH (ref 8–23)
CO2: 20 mmol/L — ABNORMAL LOW (ref 22–32)
Calcium: 7.7 mg/dL — ABNORMAL LOW (ref 8.9–10.3)
Chloride: 92 mmol/L — ABNORMAL LOW (ref 98–111)
Creatinine, Ser: 4.44 mg/dL — ABNORMAL HIGH (ref 0.44–1.00)
GFR, Estimated: 10 mL/min — ABNORMAL LOW (ref >60)
Glucose, Bld: 120 mg/dL — ABNORMAL HIGH (ref 70–99)
Potassium: 4.8 mmol/L (ref 3.5–5.1)
Sodium: 121 mmol/L — ABNORMAL LOW (ref 135–145)

## 2022-07-17 LAB — GLUCOSE, CAPILLARY
Glucose-Capillary: 142 mg/dL — ABNORMAL HIGH (ref 70–99)
Glucose-Capillary: 89 mg/dL (ref 70–99)

## 2022-07-17 MED ORDER — POLYETHYLENE GLYCOL 3350 17 G PO PACK
17.0000 g | PACK | Freq: Every day | ORAL | Status: DC
  Administered 2022-07-17: 17 g via ORAL
  Filled 2022-07-17: qty 1

## 2022-07-17 MED ORDER — ALBUMIN HUMAN 25 % IV SOLN
50.0000 g | Freq: Once | INTRAVENOUS | Status: AC
Start: 2022-07-17 — End: 2022-07-18
  Administered 2022-07-17: 50 g via INTRAVENOUS
  Filled 2022-07-17: qty 200

## 2022-07-17 MED ORDER — SACCHAROMYCES BOULARDII 250 MG PO CAPS
250.0000 mg | ORAL_CAPSULE | Freq: Two times a day (BID) | ORAL | Status: DC
  Administered 2022-07-17: 250 mg via ORAL
  Filled 2022-07-17: qty 1

## 2022-07-17 MED ORDER — BISACODYL 5 MG PO TBEC
10.0000 mg | DELAYED_RELEASE_TABLET | Freq: Once | ORAL | Status: AC
Start: 2022-07-17 — End: 2022-07-17
  Administered 2022-07-17: 10 mg via ORAL
  Filled 2022-07-17: qty 2

## 2022-07-17 NOTE — Progress Notes (Signed)
PROGRESS NOTE    [name] [name]  IRC:[id number] DOB: 05-28-1941 DOA: 07/23/2022 PCP: Renee Rival, FNP    Brief Narrative:  81 y.o. female with medical history significant of hypertension, bipolar disorder, hyperlipidemia, hypothyroidism who lives at home, has been experiencing generalized weakness for the past week.  Last night, she suffered a fall.  Cannot tell me the circumstances around the fall.  She does complain of some cough and sore throat.  She has not had any nausea or vomiting.  She has not had any diarrhea.  Denies any dysuria.  She has chronic back pain which she reported is unchanged.  On arrival to the emergency room, noted to be mildly hypoxic and hypotensive.  Supplemental oxygen was applied and she was started on IV fluids.  She is also noted to be hypoglycemic.  Urinalysis indicates possible infection.  Further imaging also indicates possible bilateral pneumonia.  She has been referred for admission.   Assessment & Plan:   Principal Problem:   Sepsis ([facility]) Active Problems:   Hyperlipidemia   HTN (hypertension)   Bipolar disorder ([facility])   Pneumonia   UTI (urinary tract infection)   AKI (acute kidney injury) ([place])   Hyponatremia   E. coli sepsis -Blood cultures positive for gram-negative rods, BCID positive for E. coli -Likely secondary to pneumonia versus UTI -On admission, she was noted to be tachycardic with leukocytosis and acute kidney injury -Currently on ceftriaxone and azithromycin -urine culture in process -Check sputum culture and urinary antigens -Respiratory viral panel negative -Continue pulmonary hygiene -Speech therapy evaluation   Acute kidney injury -Hold further ARB -No evidence of hydronephrosis on CT imaging -Continue IV fluids and monitor urine output -Baseline creatinine 0.7 -Admission creatinine 3.8 -Creatinine today of 4.1 -We will consult nephrology   Hyponatremia -Likely related to diminished p.o. intake -Continue on  saline infusion   Right paratracheal prominence -We will need further imaging with CT chest with IV contrast once renal function has recovered   Bipolar disorder -Continue home regimen   Hyperlipidemia -Continue statin   Hypertension -Holding antihypertensives for now while blood pressures are soft   Hypothyroidism -Continue Synthroid  Hypoglycemia -Noted to have a blood sugar of 58 on chemistry this morning -suspect is related to sepsis -She is tolerating p.o. intake -Follow CBGs   DVT prophylaxis: heparin injection 5,000 Units Start: 07/20/2022 1730  Code Status: Full code Family Communication: Discussed with patient's daughter at the bedside Disposition Plan: Status is: Inpatient Remains inpatient appropriate because: Continued management of sepsis     Consultants:  Nephrology  Procedures:    Antimicrobials:  Ceftriaxone 9/4 > Azithromycin 9/4 >   Subjective: Patient remains very weak.  Family feels that her mental status may be a little better today.  She does not have any cough.  She is required in and out catheterization overnight.  Objective: Vitals:   07/17/22 1000 07/17/22 1100 07/17/22 1115 07/17/22 1200  BP: (!) 111/38     Pulse: 98 97 97 98  Resp: (!) '24 19 20 19  '$ Temp:   97.9 F (36.6 C)   TempSrc:   Oral   SpO2: (!) 87% 92% 94% 93%  Weight:      Height:        Intake/Output Summary (Last 24 hours) at 07/17/2022 1213 Last data filed at 07/17/2022 0711 Gross per 24 hour  Intake 2345.78 ml  Output 820 ml  Net 1525.78 ml   Filed Weights   07/26/2022 0938 07/15/2022 1336 07/17/22  0500  Weight: 51.3 kg 55.7 kg 59.1 kg    Examination:  General exam: Appears calm and comfortable  Respiratory system: Clear to auscultation. Respiratory effort normal. Cardiovascular system: S1 & S2 heard, RRR. No JVD, murmurs, rubs, gallops or clicks. No pedal edema. Gastrointestinal system: Abdomen is nondistended, soft and nontender. No organomegaly or masses  felt. Normal bowel sounds heard. Central nervous system: Alert and oriented. No focal neurological deficits.  She is generally weak Extremities: Symmetric 5 x 5 power. Skin: No rashes, lesions or ulcers Psychiatry: Confused at times, pleasant    Data Reviewed: I have personally reviewed following labs and imaging studies  CBC: Recent Labs  Lab 07/18/2022 1014 07/17/22 0437  WBC 28.9* 25.8*  NEUTROABS 27.7*  --   HGB 10.9* 9.6*  HCT 31.8* 28.7*  MCV 96.7 98.3  PLT 177 694   Basic Metabolic Panel: Recent Labs  Lab 07/25/2022 1014 07/17/22 0437  NA 124* 123*  K 4.0 4.4  CL 90* 95*  CO2 24 21*  GLUCOSE 79 58*  BUN 47* 49*  CREATININE 3.87* 4.15*  CALCIUM 8.3* 7.5*   GFR: Estimated Creatinine Clearance: 8 mL/min (A) (by C-G formula based on SCr of 4.15 mg/dL (H)). Liver Function Tests: Recent Labs  Lab 08/02/2022 1014 07/17/22 0437  AST 28 20  ALT 25 21  ALKPHOS 136* 132*  BILITOT 0.6 0.6  PROT 5.7* 4.8*  ALBUMIN 2.6* 2.0*   No results for input(s): "LIPASE", "AMYLASE" in the last 168 hours. No results for input(s): "AMMONIA" in the last 168 hours. Coagulation Profile: Recent Labs  Lab 07/20/2022 1014  INR 1.1   Cardiac Enzymes: No results for input(s): "CKTOTAL", "CKMB", "CKMBINDEX", "TROPONINI" in the last 168 hours. BNP (last 3 results) No results for input(s): "PROBNP" in the last 8760 hours. HbA1C: No results for input(s): "HGBA1C" in the last 72 hours. CBG: Recent Labs  Lab 07/28/2022 0936  GLUCAP 69*   Lipid Profile: No results for input(s): "CHOL", "HDL", "LDLCALC", "TRIG", "CHOLHDL", "LDLDIRECT" in the last 72 hours. Thyroid Function Tests: No results for input(s): "TSH", "T4TOTAL", "FREET4", "T3FREE", "THYROIDAB" in the last 72 hours. Anemia Panel: No results for input(s): "VITAMINB12", "FOLATE", "FERRITIN", "TIBC", "IRON", "RETICCTPCT" in the last 72 hours. Sepsis Labs: Recent Labs  Lab 07/23/2022 1014  1157  LATICACIDVEN 1.5 1.4     Recent Results (from the past 240 hour(s))  Resp Panel by RT-PCR (Flu A&B, Covid) Anterior Nasal Swab     Status: None   Collection Time: 07/30/2022 10:14 AM   Specimen: Anterior Nasal Swab  Result Value Ref Range Status   SARS Coronavirus 2 by RT PCR NEGATIVE NEGATIVE Final    Comment: (NOTE) SARS-CoV-2 target nucleic acids are NOT DETECTED.  The SARS-CoV-2 RNA is generally detectable in upper respiratory specimens during the acute phase of infection. The lowest concentration of SARS-CoV-2 viral copies this assay can detect is 138 copies/mL. A negative result does not preclude SARS-Cov-2 infection and should not be used as the sole basis for treatment or other patient management decisions. A negative result may occur with  improper specimen collection/handling, submission of specimen other than nasopharyngeal swab, presence of viral mutation(s) within the areas targeted by this assay, and inadequate number of viral copies(<138 copies/mL). A negative result must be combined with clinical observations, patient history, and epidemiological information. The expected result is Negative.  Fact Sheet for Patients:  EntrepreneurPulse.com.au  Fact Sheet for Healthcare Providers:  IncredibleEmployment.be  This test is no t yet approved  or cleared by the [place] and  has been authorized for detection and/or diagnosis of SARS-CoV-2 by FDA under an Emergency Use Authorization (EUA). This EUA will remain  in effect (meaning this test can be used) for the duration of the COVID-19 declaration under Section 564(b)(1) of the Act, 21 U.S.C.section 360bbb-3(b)(1), unless the authorization is terminated  or revoked sooner.       Influenza A by PCR NEGATIVE NEGATIVE Final   Influenza B by PCR NEGATIVE NEGATIVE Final    Comment: (NOTE) The Xpert Xpress SARS-CoV-2/FLU/RSV plus assay is intended as an aid in the diagnosis of influenza from  Nasopharyngeal swab specimens and should not be used as a sole basis for treatment. Nasal washings and aspirates are unacceptable for Xpert Xpress SARS-CoV-2/FLU/RSV testing.  Fact Sheet for Patients: EntrepreneurPulse.com.au  Fact Sheet for Healthcare Providers: IncredibleEmployment.be  This test is not yet approved or cleared by the [place] FDA and has been authorized for detection and/or diagnosis of SARS-CoV-2 by FDA under an Emergency Use Authorization (EUA). This EUA will remain in effect (meaning this test can be used) for the duration of the COVID-19 declaration under Section 564(b)(1) of the Act, 21 U.S.C. section 360bbb-3(b)(1), unless the authorization is terminated or revoked.  Performed at [facility], [address], [place], [place] [address]   Blood Culture (routine x 2)     Status: None (Preliminary result)   Collection Time: 08/06/2022 10:14 AM   Specimen: Right Antecubital; Blood  Result Value Ref Range Status   Specimen Description   Final    RIGHT ANTECUBITAL BOTTLES DRAWN AEROBIC AND ANAEROBIC Performed at [facility], [address], [place], [place] [address]    Special Requests   Final    Blood Culture adequate volume Performed at [facility], [address] [place], [place] [address]    Culture  Setup Time   Final    GRAM NEGATIVE RODS IN BOTH AEROBIC AND ANAEROBIC BOTTLES Gram Stain Report Called to,Read Back By and Verified With: R.ESTOCE @ 2222 BY MATTHEWS, B 9.4.2023 CRITICAL RESULT CALLED TO, READ BACK BY AND VERIFIED WITH: E TINAJERO,RN'@0215'$  07/17/22 [place] Performed at [facility], [place] [address] [place], [place] [address]    Culture GRAM NEGATIVE RODS  Final   Report Status PENDING  Incomplete  Blood Culture ID Panel (Reflexed)     Status: Abnormal   Collection Time: 08/09/2022 10:14 AM  Result Value Ref Range Status   Enterococcus faecalis NOT DETECTED NOT DETECTED Final   Enterococcus Faecium NOT  DETECTED NOT DETECTED Final   Listeria monocytogenes NOT DETECTED NOT DETECTED Final   Staphylococcus species NOT DETECTED NOT DETECTED Final   Staphylococcus aureus (BCID) NOT DETECTED NOT DETECTED Final   Staphylococcus epidermidis NOT DETECTED NOT DETECTED Final   Staphylococcus lugdunensis NOT DETECTED NOT DETECTED Final   Streptococcus species NOT DETECTED NOT DETECTED Final   Streptococcus agalactiae NOT DETECTED NOT DETECTED Final   Streptococcus pneumoniae NOT DETECTED NOT DETECTED Final   Streptococcus pyogenes NOT DETECTED NOT DETECTED Final   A.calcoaceticus-baumannii NOT DETECTED NOT DETECTED Final   Bacteroides fragilis NOT DETECTED NOT DETECTED Final   Enterobacterales DETECTED (A) NOT DETECTED Final    Comment: Enterobacterales represent a large order of gram negative bacteria, not a single organism. CRITICAL RESULT CALLED TO, READ BACK BY AND VERIFIED WITH: E TINAJERO,RN'@0216'$  07/17/22 [place]    Enterobacter cloacae complex NOT DETECTED NOT DETECTED Final   Escherichia coli DETECTED (A) NOT DETECTED Final  Comment: CRITICAL RESULT CALLED TO, READ BACK BY AND VERIFIED WITH: E TINAJERO,RN'@0216'$  07/17/22 [place]    Klebsiella aerogenes NOT DETECTED NOT DETECTED Final   Klebsiella oxytoca NOT DETECTED NOT DETECTED Final   Klebsiella pneumoniae NOT DETECTED NOT DETECTED Final   Proteus species NOT DETECTED NOT DETECTED Final   Salmonella species NOT DETECTED NOT DETECTED Final   Serratia marcescens NOT DETECTED NOT DETECTED Final   Haemophilus influenzae NOT DETECTED NOT DETECTED Final   Neisseria meningitidis NOT DETECTED NOT DETECTED Final   Pseudomonas aeruginosa NOT DETECTED NOT DETECTED Final   Stenotrophomonas maltophilia NOT DETECTED NOT DETECTED Final   Candida albicans NOT DETECTED NOT DETECTED Final   Candida auris NOT DETECTED NOT DETECTED Final   Candida glabrata NOT DETECTED NOT DETECTED Final   Candida krusei NOT DETECTED NOT DETECTED Final   Candida parapsilosis  NOT DETECTED NOT DETECTED Final   Candida tropicalis NOT DETECTED NOT DETECTED Final   Cryptococcus neoformans/gattii NOT DETECTED NOT DETECTED Final   CTX-M ESBL NOT DETECTED NOT DETECTED Final   Carbapenem resistance IMP NOT DETECTED NOT DETECTED Final   Carbapenem resistance KPC NOT DETECTED NOT DETECTED Final   Carbapenem resistance NDM NOT DETECTED NOT DETECTED Final   Carbapenem resist OXA 48 LIKE NOT DETECTED NOT DETECTED Final   Carbapenem resistance VIM NOT DETECTED NOT DETECTED Final    Comment: Performed at [facility], [address], [place], [place] [address]  Blood Culture (routine x 2)     Status: None (Preliminary result)   Collection Time: 07/24/2022 10:19 AM   Specimen: BLOOD RIGHT WRIST  Result Value Ref Range Status   Specimen Description   Final    BLOOD RIGHT WRIST BOTTLES DRAWN AEROBIC AND ANAEROBIC   Special Requests Blood Culture adequate volume  Final   Culture  Setup Time   Final    GRAM NEGATIVE RODS BOTTLES DRAWN AEROBIC AND ANAEROBIC Gram Stain Report Called to,Read Back By and Verified With: R.ESTOCE'@2154'$  BY MATTHEWS, B 9.4.2023   Culture   Final    NO GROWTH < 24 HOURS Performed at [facility], [address], [place], [place] [address]    Report Status PENDING  Incomplete  MRSA Next Gen by PCR, Nasal     Status: None   Collection Time: 07/31/2022  4:51 PM   Specimen: Nasal Mucosa; Nasal Swab  Result Value Ref Range Status   MRSA by PCR Next Gen NOT DETECTED NOT DETECTED Final    Comment: (NOTE) The GeneXpert MRSA Assay (FDA approved for NASAL specimens only), is one component of a comprehensive MRSA colonization surveillance program. It is not intended to diagnose MRSA infection nor to guide or monitor treatment for MRSA infections. Test performance is not FDA approved in patients less than 63 years old. Performed at [facility], [address], [place], [place] [address]   Respiratory (~20 pathogens) panel by PCR     Status: None    Collection Time: 07/22/2022  5:12 PM   Specimen: Nasopharyngeal Swab; Respiratory  Result Value Ref Range Status   Adenovirus NOT DETECTED NOT DETECTED Final   Coronavirus 229E NOT DETECTED NOT DETECTED Final    Comment: (NOTE) The Coronavirus on the Respiratory Panel, DOES NOT test for the novel  Coronavirus (2019 nCoV)    Coronavirus HKU1 NOT DETECTED NOT DETECTED Final   Coronavirus NL63 NOT DETECTED NOT DETECTED Final   Coronavirus OC43 NOT DETECTED NOT DETECTED Final   Metapneumovirus NOT DETECTED NOT DETECTED Final  Rhinovirus / Enterovirus NOT DETECTED NOT DETECTED Final   Influenza A NOT DETECTED NOT DETECTED Final   Influenza B NOT DETECTED NOT DETECTED Final   Parainfluenza Virus 1 NOT DETECTED NOT DETECTED Final   Parainfluenza Virus 2 NOT DETECTED NOT DETECTED Final   Parainfluenza Virus 3 NOT DETECTED NOT DETECTED Final   Parainfluenza Virus 4 NOT DETECTED NOT DETECTED Final   Respiratory Syncytial Virus NOT DETECTED NOT DETECTED Final   Bordetella pertussis NOT DETECTED NOT DETECTED Final   Bordetella Parapertussis NOT DETECTED NOT DETECTED Final   Chlamydophila pneumoniae NOT DETECTED NOT DETECTED Final   Mycoplasma pneumoniae NOT DETECTED NOT DETECTED Final    Comment: Performed at [facility], [place] [address], [place], [place] [address]         Radiology Studies: CT RENAL STONE STUDY  Result Date: 07/27/2022 CLINICAL DATA:  Flank pain, abdominal pain EXAM: CT ABDOMEN AND PELVIS WITHOUT CONTRAST TECHNIQUE: Multidetector CT imaging of the abdomen and pelvis was performed following the standard protocol without IV contrast. RADIATION DOSE REDUCTION: This exam was performed according to the departmental dose-optimization program which includes automated exposure control, adjustment of the mA and/or kV according to patient size and/or use of iterative reconstruction technique. COMPARISON:  07/12/2020 FINDINGS: Lower chest: There are linear patchy infiltrates in both  lower lung fields, more so on the left side suggesting atelectasis/pneumonia. Minimal bilateral pleural effusions are seen. Coronary artery calcifications are seen. Hepatobiliary: No focal abnormalities are seen. There is no dilation of bile ducts. Gallbladder is distended. There is no wall thickening in gallbladder. Pancreas: Pancreas appears smaller than usual in size. No focal abnormalities are seen. Spleen: Unremarkable. Adrenals/Urinary Tract: Adrenals are unremarkable. There is no hydronephrosis. There are no renal or ureteral stones. Beam hardening artifacts from surgical hardware in the lumbar spine limited evaluation of the kidneys. Ureters are not dilated. Urinary bladder is not distended. Evaluation of bladder is limited by severe beam hardening artifact. Stomach/Bowel: Small hiatal hernia is seen. There is no significant small bowel dilation. Appendix is not seen. There is no significant wall thickening in colon. Diverticula are seen in the colon without signs of focal diverticulitis. Vascular/Lymphatic: Scattered arterial calcifications are seen. Reproductive: Evaluation is limited by severe beam hardening artifact. Uterus is not seen. Other: There is no ascites or pneumoperitoneum. There is mild stranding in the mesentery without any loculated fluid collections or significant lymphadenopathy. Musculoskeletal: There is previous arthroplasty in both hips. There is posterior fusion at the L4-L5 level in lumbar spine. Degenerative changes are noted in lower thoracic spine and lumbar spine. There is significant interval worsening of degenerative changes at T12-L1 level with erosive changes in the adjacent endplates. No definite loculated paraspinal fluid collections are seen. IMPRESSION: Beam hardening artifacts from surgical hardware in lumbar spine and both hips limit the study. There is no hydronephrosis. There are no renal or ureteral stones. There is no evidence of intestinal obstruction or  pneumoperitoneum. There is significant interval worsening of degenerative changes at T12-L1 level. Possibility of discitis is not excluded. Please correlate with clinical symptoms and laboratory findings. If there is clinical suspicion for discitis, follow-up MRI may be considered. Small bilateral pleural effusions, more so on the left side. There are patchy infiltrates in both lower lung fields suggesting atelectasis/pneumonia. Small hiatal hernia. Diverticulosis of colon. There is mild stranding in the mesentery without any loculated fluid collections or significant lymphadenopathy. This may be a normal variation or suggest nonspecific mesenteric inflammation. Electronically Signed  By: Elmer Picker M.D.   On: 08/05/2022 13:32   DG Hips Bilat W or Wo Pelvis 3-4 Views  Result Date: 08/02/2022 CLINICAL DATA:  Fall. EXAM: DG HIP (WITH OR WITHOUT PELVIS) 3-4V BILAT COMPARISON:  October 11, 2021. FINDINGS: Status post bilateral total hip arthroplasties. Sacroiliac joints are unremarkable. No fracture or dislocation is noted. IMPRESSION: No acute abnormality is noted. Electronically Signed   By: Marijo Conception M.D.   On: 08/08/2022 11:44   DG Chest Port 1 View  Result Date: 08/06/2022 CLINICAL DATA:  Sepsis. EXAM: PORTABLE CHEST 1 VIEW COMPARISON:  April 14, 2020. FINDINGS: Normal cardiac size. Right paratracheal prominence is noted concerning for adenopathy or mass. Status post left shoulder arthroplasty. Severe degenerative changes seen involving the right shoulder. Lungs are clear. IMPRESSION: Right paratracheal prominence is concerning for mass or adenopathy. CT scan of the chest with intravenous contrast is recommended for further evaluation. Electronically Signed   By: Marijo Conception M.D.   On: 07/23/2022 11:43   CT Head Wo Contrast  Result Date: 08/03/2022 CLINICAL DATA:  Increasing weakness.  Fall. EXAM: CT HEAD WITHOUT CONTRAST TECHNIQUE: Contiguous axial images were obtained from the base of  the skull through the vertex without intravenous contrast. RADIATION DOSE REDUCTION: This exam was performed according to the departmental dose-optimization program which includes automated exposure control, adjustment of the mA and/or kV according to patient size and/or use of iterative reconstruction technique. COMPARISON:  04/17/2020 FINDINGS: Brain: There is no evidence for acute hemorrhage, hydrocephalus, mass lesion, or abnormal extra-axial fluid collection. No definite CT evidence for acute infarction. Diffuse loss of parenchymal volume is consistent with atrophy. Patchy low attenuation in the deep hemispheric and periventricular white matter is nonspecific, but likely reflects chronic microvascular ischemic demyelination. Vascular: No hyperdense vessel or unexpected calcification. Skull: Normal. Negative for fracture or focal lesion. Sinuses/Orbits: No acute finding. Other: None. IMPRESSION: 1. No acute intracranial abnormality. 2. Atrophy with chronic small vessel ischemic disease. Electronically Signed   By: Misty Stanley M.D.   On: 07/23/2022 11:19        Scheduled Meds:  aspirin EC  81 mg Oral Daily   atorvastatin  40 mg Oral Daily   buPROPion  300 mg Oral Daily   Chlorhexidine Gluconate Cloth  6 each Topical Q0600   DULoxetine  120 mg Oral Daily   guaiFENesin  600 mg Oral BID   heparin  5,000 Units Subcutaneous Q8H   lamoTRIgine  150 mg Oral QHS   levothyroxine  25 mcg Oral Daily   pantoprazole  40 mg Oral Daily   risperiDONE  1 mg Oral QHS   Continuous Infusions:  sodium chloride Stopped (08/03/2022 1804)   azithromycin Stopped (07/27/2022 1904)   cefTRIAXone (ROCEPHIN)  IV Stopped (08/05/2022 1745)     LOS: 1 day    Time spent: 35 minutes    Kathie Dike, MD [facility]   If 7PM-7AM, please contact night-coverage [email or web address]  07/17/2022, 12:13 PM

## 2022-07-17 NOTE — Evaluation (Signed)
Clinical/Bedside Swallow Evaluation Patient Details  Name: [name] [name] MRN: [id number] Date of Birth: 1940/12/26  Today's Date: 07/17/2022 Time: SLP Start Time (ACUTE ONLY): 2003 SLP Stop Time (ACUTE ONLY): 2034 SLP Time Calculation (min) (ACUTE ONLY): 31 min  Past Medical History:  Past Medical History:  Diagnosis Date   Anemia    Anxiety    Arthritis    BIPOLAR AFFECTIVE DISORDER 05/11/2009   Qualifier: Diagnosis of  By: Orville Govern, CMA, Carol     Bipolar affective disorder ([place])    Bipolar disorder ([place])    Cataract    Chest pain 05/11/2009   Qualifier: Diagnosis of  By: Orville Govern, CMA, Carol     Chronic back pain    Chronic cough 11/23/2014   Chronic fatigue    Chronic pain syndrome 03/30/8415   Complication of anesthesia    Constipation    Depression    Difficulty in walking(719.7) 07/26/2011   Dizziness    Dyslipidemia    Fecal incontinence 11/23/2014   Fibromyalgia    GERD (gastroesophageal reflux disease)    H/O anxiety disorder 03/05/2013   Overview:  Heart Cath x 2 per patient; most recent 4 yrs ago with "negative" findings per patient; Cardiologist, Dr Dorris Carnes following; last seen 10/2011; most recent episode 11/2012 with admit [facility]) and negative work-up per patient    H/O: hysterectomy    History of back surgery 03/05/2013   History of gastroesophageal reflux (GERD) 03/05/2013   History of kidney stones    Hx of adenomatous colonic polyps 2005   Hyperlipidemia    Hypertension    Hypothyroidism    Knee pain    Midsternal chest pain 08/19/2015   Muscle weakness (generalized) 04/27/2013   Non-obstructive CAD    a. 08/2015 Cath: LM 40, LCX small, 30ost, 12m OM2 small, RCA nl/small, EF 55-65%.   Osteopenia    PONV (postoperative nausea and vomiting)    Primary osteoarthritis of left knee 04/17/2020   Scalp laceration 03/01/2020   Sciatica    Past Surgical History:  Past Surgical History:  Procedure Laterality Date   ABDOMINAL HYSTERECTOMY  1994   TAH-  DUB, BSO   [place] bunionectomy     bilateral   BIOPSY  07/06/2022   Procedure: BIOPSY;  Surgeon: RDaneil Dolin MD;  [facility];  Service: Endoscopy;;   BREAST BIOPSY Left 1989   BREAST LUMPECTOMY     BREAST SURGERY N/A    [employer]rees[employer] 09/14/2020   BUNIONECTOMY Left 2005   CARDIAC CATHETERIZATION  2010   +CAD   CARDIAC CATHETERIZATION N/A 08/19/2015   Procedure: Left Heart Cath and Coronary Angiography;  Surgeon: HBelva Crome MD;  [facility];  Service: Cardiovascular;  Laterality: N/A;   COLONOSCOPY  01/29/2007   RSAY:TKZSWFrectum, left-sided diverticula, 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  Remainder of colonic mucosa was normal   COLONOSCOPY WITH ESOPHAGOGASTRODUODENOSCOPY (EGD) N/A 02/01/2014   RUXN:ATFTerosive reflux/gastric polyp/normal rectum/scattered left sided diverticula, normal distal TI. gastric bx negative, fundic gland polyp. next TCS 01/2019.   COLONOSCOPY WITH PROPOFOL N/A 07/06/2022   Procedure: COLONOSCOPY WITH PROPOFOL;  Surgeon: RDaneil Dolin MD;  [facility];  Service: Endoscopy;  Laterality: N/A;  10:30am   CYSTECTOMY     pilonidial cyst   ESOPHAGOGASTRODUODENOSCOPY     followed by colonoscopy with snare polypectomy   ESOPHAGOGASTRODUODENOSCOPY (EGD) WITH PROPOFOL N/A 07/06/2022   Procedure: ESOPHAGOGASTRODUODENOSCOPY (EGD) WITH PROPOFOL;  Surgeon: RManus Rudd  M, MD;  [facility];  Service: Endoscopy;  Laterality: N/A;   EYE SURGERY Bilateral    cataract surgery with lens implants   JOINT REPLACEMENT N/A    [employer]reesia 09/14/2020   MALONEY DILATION N/A 07/06/2022   Procedure: Venia Minks DILATION;  Surgeon: Daneil Dolin, MD;  [facility];  Service: Endoscopy;  Laterality: N/A;   REVISION TOTAL HIP ARTHROPLASTY Right    right knee replacement  02/2013   SHOULDER ARTHROSCOPY Left 2005   SHOULDER ARTHROSCOPY Right    SPINAL FUSION     SPINAL FUSION  2004   L4-L5   SPINE SURGERY  N/A    [employer]reesia 09/14/2020   TOTAL HIP ARTHROPLASTY Right 06/2006   TOTAL HIP ARTHROPLASTY  8/12   TOTAL KNEE ARTHROPLASTY Left 04/18/2020   TOTAL KNEE ARTHROPLASTY Left 04/18/2020   Procedure: LEFT TOTAL KNEE ARTHROPLASTY;  Surgeon: Leandrew Koyanagi, MD;  Location: [facility];  Service: Orthopedics;  Laterality: Left;   TOTAL SHOULDER REPLACEMENT Left 08/2005   HPI:  81 y.o. female with medical history significant of hypertension, bipolar disorder, hyperlipidemia, hypothyroidism who lives at home, has been experiencing generalized weakness for the past week.  Last night, she suffered a fall.  Cannot tell me the circumstances around the fall.  She does complain of some cough and sore throat.  She has not had any nausea or vomiting.  She has not had any diarrhea.  Denies any dysuria.  She has chronic back pain which she reported is unchanged.  On arrival to the emergency room, noted to be mildly hypoxic and hypotensive.  Supplemental oxygen was applied and she was started on IV fluids.  She is also noted to be hypoglycemic.  Urinalysis indicates possible infection.  Further imaging also indicates possible bilateral pneumonia. Pt had EGD with Dr. Gala Romney on 07/06/22 which showed mild schatzki ring with dilation, geographic erosive ulcerative reflux esophagitis, and medium sized hiatal hernia. Pt received a massage on Friday afternoon and felt ill Friday night. She was admitted to the hospital on Monday.  BSE requested.    Assessment / Plan / Recommendation  Clinical Impression  Clinical swallow evaluation completed at bedside this evening with family present. SLP spoke with Pt earlier in the day and she appears more restless and with altered mental status tonight. Oral motor examination is WNL with the exception of xerostomia. Pt's daughter reported that Pt had a strong coughing episode around 4:30 PM when drinking water and that she noticed possible wheezing this evening. Pt currently with impaired breath support for  conversational speech and occasionally inhales/exhales inappropriately when sipping on water (hands also more tremulous this evening than baseline), which results in reduced oral control/coordination with liquids. Pt with occasional strong, dry cough. Pt manipulated puree normally, however intake was limited. Pt reportedly consumed her lasagna once cut up well. Recommend downgrading to D3/mech soft per RN and family report of benefitting from soft solids currently and offer NTL via small spoon presentations until mentation improves (OK for small sips of water and/or ice chips between meals). SLP to follow up tomorrow.  CT Chest: Right paratracheal prominence is concerning for mass or adenopathy. CT scan of the chest with intravenous contrast is recommended for further evaluation.  SLP Visit Diagnosis: Dysphagia, unspecified (R13.10)    Aspiration Risk  Mild aspiration risk    Diet Recommendation Dysphagia 3 (Mech soft);Nectar-thick liquid   Liquid Administration via: Spoon;Cup Medication Administration: Whole meds with puree Supervision: Patient able to self  feed;Staff to assist with self feeding;Full supervision/cueing for compensatory strategies Compensations: Slow rate;Small sips/bites Postural Changes: Seated upright at 90 degrees;Remain upright for at least 30 minutes after po intake    Other  Recommendations Oral Care Recommendations: Oral care BID;Oral care prior to ice chip/H20;Staff/trained caregiver to provide oral care Other Recommendations: Order thickener from pharmacy;Prohibited food (jello, ice cream, thin soups);Clarify dietary restrictions    Recommendations for follow up therapy are one component of a multi-disciplinary discharge planning process, led by the attending physician.  Recommendations may be updated based on patient status, additional functional criteria and insurance authorization.  Follow up Recommendations Follow physician's recommendations for discharge plan  and follow up therapies      Assistance Recommended at Discharge Intermittent Supervision/Assistance  Functional Status Assessment Patient has had a recent decline in their functional status and demonstrates the ability to make significant improvements in function in a reasonable and predictable amount of time.  Frequency and Duration min 2x/week  1 week       Prognosis Prognosis for Safe Diet Advancement: Good      Swallow Study   General Date of Onset: 07/25/2022 HPI: 81 y.o. female with medical history significant of hypertension, bipolar disorder, hyperlipidemia, hypothyroidism who lives at home, has been experiencing generalized weakness for the past week.  Last night, she suffered a fall.  Cannot tell me the circumstances around the fall.  She does complain of some cough and sore throat.  She has not had any nausea or vomiting.  She has not had any diarrhea.  Denies any dysuria.  She has chronic back pain which she reported is unchanged.  On arrival to the emergency room, noted to be mildly hypoxic and hypotensive.  Supplemental oxygen was applied and she was started on IV fluids.  She is also noted to be hypoglycemic.  Urinalysis indicates possible infection.  Further imaging also indicates possible bilateral pneumonia. Pt had EGD with Dr. Gala Romney on 07/06/22 which showed mild schatzki ring with dilation, geographic erosive ulcerative reflux esophagitis, and medium sized hiatal hernia. Pt received a massage on Friday afternoon and felt ill Friday night. She was admitted to the hospital on Monday.  BSE requested. Type of Study: Bedside Swallow Evaluation Previous Swallow Assessment: none on record Diet Prior to this Study: Regular;Thin liquids Temperature Spikes Noted: No Respiratory Status: Room air History of Recent Intubation: No Behavior/Cognition: Alert;Cooperative;Confused Oral Cavity Assessment: Dry Oral Care Completed by SLP: Yes Oral Cavity - Dentition: Adequate natural  dentition Vision: Functional for self-feeding Self-Feeding Abilities: Able to feed self;Needs assist Patient Positioning: Upright in bed Baseline Vocal Quality: Normal Volitional Cough: Strong Volitional Swallow: Able to elicit    Oral/Motor/Sensory Function Overall Oral Motor/Sensory Function: Within functional limits   Ice Chips Ice chips: Within functional limits Presentation: Spoon   Thin Liquid Thin Liquid: Impaired Presentation: Cup;Self Fed;Spoon;Straw Oral Phase Impairments: Reduced labial seal (exhale instead of inhale at times, reduced coordination) Oral Phase Functional Implications: Left anterior spillage Pharyngeal  Phase Impairments: Cough - Immediate (on ~40% of trials)    Nectar Thick Nectar Thick Liquid: Within functional limits Presentation: Cup;Spoon   Honey Thick Honey Thick Liquid: Not tested   Puree Puree: Within functional limits Presentation: Spoon   Solid     Solid: Not tested Presentation:  (She had just eaten dinner meal of lasagna)     Thank you,  Genene Churn, [place]  Autum Benfer 07/17/2022,8:45 PM

## 2022-07-17 NOTE — TOC Progression Note (Signed)
  Transition of Care [facility]) Screening Note   Patient Details  Name: [name] [name] Date of Birth: Jul 31, 1941   Transition of Care [facility]) CM/SW Contact:    Boneta Lucks, RN Phone Number: 07/17/2022, 3:06 PM    Transition of Care Department [facility]) has reviewed patient and no TOC needs have been identified at this time. We will continue to monitor patient advancement through interdisciplinary progression rounds. If new patient transition needs arise, please place a TOC consult.      Barriers to Discharge: Continued Medical Work up

## 2022-07-17 NOTE — Consult Note (Addendum)
Reason for Consult: AKI Referring Physician: Roderic Palau, MD  [name] [name] is an 81 y.o. female with a PMH significant for HTN, HLD, hypothyroidism, Bipolar disorder, depression, fibromyalgia, and chronic pain syndrome who was brought to [facility] ED yesterday after a fall at home.  She also has a 4 day history of worsening weakness, fevers, chills, dysuria, and malaise.  She also complained of a sore throat and cough.  In the ED, Bp was 92/56, HR 110, temp 98.4, SpO2 97%.  Labs were notable for WBC 28.9, Hgb 10.9, Na 124, Cl 90, BUN 47, Cr 3.87.  She was admitted under code sepsis and we were consulted to further evaluate her AKI.  The trend in Scr is seen below.  She had a colonoscopy last week and had explosive diarrhea prior and post procedure.  Her daughter reports she became confused yesterday afternoon after she was admitted to the hospital.  She remains confused and her daughter was the main source of information for the HPI.  Her daughter reports poor po intake for the 4 days prior to her admission.  Of note, she had also been taking losartan and meloxicam prior to admission.   Trend in Creatinine: Creatinine, Ser  Date/Time Value Ref Range Status  07/17/2022 04:37 AM 4.15 (H) 0.44 - 1.00 mg/dL Final  08/01/2022 10:14 AM 3.87 (H) 0.44 - 1.00 mg/dL Final  06/26/2022 10:50 AM 0.70 0.57 - 1.00 mg/dL Final  02/14/2022 11:31 AM 0.93 0.57 - 1.00 mg/dL Final  06/29/2021 09:22 AM 0.89 0.57 - 1.00 mg/dL Final  04/03/2021 11:26 AM 0.83 0.44 - 1.00 mg/dL Final  07/12/2020 10:32 AM 0.81 0.44 - 1.00 mg/dL Final  04/19/2020 04:12 AM 0.64 0.44 - 1.00 mg/dL Final  04/14/2020 01:55 PM 0.80 0.44 - 1.00 mg/dL Final  08/18/2015 04:51 PM 0.96 0.44 - 1.00 mg/dL Final  11/28/2012 12:58 PM 0.62 0.50 - 1.10 mg/dL Final  11/21/2009 09:54 AM 0.8 0.4 - 1.2 mg/dL Final  12/28/2008 04:58 AM 0.83 0.4 - 1.2 mg/dL Final  12/24/2008 05:09 PM 0.70 0.4 - 1.2 mg/dL Final  09/15/2007 07:00 PM 0.63  Final    PMH:   Past Medical  History:  Diagnosis Date   Anemia    Anxiety    Arthritis    BIPOLAR AFFECTIVE DISORDER 05/11/2009   Qualifier: Diagnosis of  By: Orville Govern, CMA, Carol     Bipolar affective disorder ([place])    Bipolar disorder ([place])    Cataract    Chest pain 05/11/2009   Qualifier: Diagnosis of  By: Orville Govern, CMA, Carol     Chronic back pain    Chronic cough 11/23/2014   Chronic fatigue    Chronic pain syndrome 05/10/4764   Complication of anesthesia    Constipation    Depression    Difficulty in walking(719.7) 07/26/2011   Dizziness    Dyslipidemia    Fecal incontinence 11/23/2014   Fibromyalgia    GERD (gastroesophageal reflux disease)    H/O anxiety disorder 03/05/2013   Overview:  Heart Cath x 2 per patient; most recent 4 yrs ago with "negative" findings per patient; Cardiologist, Dr Dorris Carnes following; last seen 10/2011; most recent episode 11/2012 with admit [facility]) and negative work-up per patient    H/O: hysterectomy    History of back surgery 03/05/2013   History of gastroesophageal reflux (GERD) 03/05/2013   History of kidney stones    Hx of adenomatous colonic polyps 2005   Hyperlipidemia    Hypertension  Hypothyroidism    Knee pain    Midsternal chest pain 08/19/2015   Muscle weakness (generalized) 04/27/2013   Non-obstructive CAD    a. 08/2015 Cath: LM 40, LCX small, 30ost, 38m OM2 small, RCA nl/small, EF 55-65%.   Osteopenia    PONV (postoperative nausea and vomiting)    Primary osteoarthritis of left knee 04/17/2020   Scalp laceration 03/01/2020   Sciatica     PSH:   Past Surgical History:  Procedure Laterality Date   ABDOMINAL HYSTERECTOMY  1994   TAH- DUB, BSO   [place] bunionectomy     bilateral   BIOPSY  07/06/2022   Procedure: BIOPSY;  Surgeon: RDaneil Dolin MD;  [facility];  Service: Endoscopy;;   BREAST BIOPSY Left 1989   BREAST LUMPECTOMY     BREAST SURGERY N/A    Phreesia 09/14/2020   BUNIONECTOMY Left 2005   CARDIAC CATHETERIZATION   2010   +CAD   CARDIAC CATHETERIZATION N/A 08/19/2015   Procedure: Left Heart Cath and Coronary Angiography;  Surgeon: HBelva Crome MD;  [facility];  Service: Cardiovascular;  Laterality: N/A;   COLONOSCOPY  01/29/2007   RNLZ:JQBHALrectum, left-sided diverticula, 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  Remainder of colonic mucosa was normal   COLONOSCOPY WITH ESOPHAGOGASTRODUODENOSCOPY (EGD) N/A 02/01/2014   RPFX:TKWIerosive reflux/gastric polyp/normal rectum/scattered left sided diverticula, normal distal TI. gastric bx negative, fundic gland polyp. next TCS 01/2019.   COLONOSCOPY WITH PROPOFOL N/A 07/06/2022   Procedure: COLONOSCOPY WITH PROPOFOL;  Surgeon: RDaneil Dolin MD;  [facility];  Service: Endoscopy;  Laterality: N/A;  10:30am   CYSTECTOMY     pilonidial cyst   ESOPHAGOGASTRODUODENOSCOPY     followed by colonoscopy with snare polypectomy   ESOPHAGOGASTRODUODENOSCOPY (EGD) WITH PROPOFOL N/A 07/06/2022   Procedure: ESOPHAGOGASTRODUODENOSCOPY (EGD) WITH PROPOFOL;  Surgeon: RDaneil Dolin MD;  [facility];  Service: Endoscopy;  Laterality: N/A;   EYE SURGERY Bilateral    cataract surgery with lens implants   JOINT REPLACEMENT N/A    Phreesia 09/14/2020   MALONEY DILATION N/A 07/06/2022   Procedure: MVenia MinksDILATION;  Surgeon: RDaneil Dolin MD;  [facility];  Service: Endoscopy;  Laterality: N/A;   REVISION TOTAL HIP ARTHROPLASTY Right    right knee replacement  02/2013   SHOULDER ARTHROSCOPY Left 2005   SHOULDER ARTHROSCOPY Right    SPINAL FUSION     SPINAL FUSION  2004   L4-L5   SPINE SURGERY N/A    Phreesia 09/14/2020   TOTAL HIP ARTHROPLASTY Right 06/2006   TOTAL HIP ARTHROPLASTY  8/12   TOTAL KNEE ARTHROPLASTY Left 04/18/2020   TOTAL KNEE ARTHROPLASTY Left 04/18/2020   Procedure: LEFT TOTAL KNEE ARTHROPLASTY;  Surgeon: XLeandrew Koyanagi MD;  [facility] [facility]  Service: Orthopedics;  Laterality: Left;    TOTAL SHOULDER REPLACEMENT Left 08/2005    Allergies:  Allergies  Allergen Reactions   Hydrocodone-Acetaminophen Hives and Itching    Medications:   Prior to Admission medications   Medication Sig Start Date End Date Taking? Authorizing Provider  ALPRAZolam (Duanne Moron 0.5 MG tablet Take 0.25-0.5 mg by mouth daily as needed for anxiety.  12/15/13  Yes [provider]  aspirin EC 81 MG tablet Take 81 mg by mouth daily. Swallow whole.   Yes [provider]  atorvastatin (LIPITOR) 40 MG tablet Take 1 tablet (40 mg total) by mouth daily. 05/03/22  Yes Paseda, FDewaine Conger FNP  bisacodyl (DULCOLAX) 5 MG EC tablet Take 1 tablet by mouth daily as needed for mild constipation (constipation).    Yes [provider]  Cholecalciferol (VITAMIN D) 50 MCG (2000 UT) CAPS Take 2,000 Units by mouth daily.   Yes [provider]  DULoxetine (CYMBALTA) 60 MG capsule Take 120 mg by mouth daily.   Yes [provider]  estradiol (ESTRACE) 1 MG tablet TAKE 1/2 TABLET BY MOUTH DAILY. 06/27/22  Yes Lindell Spar, MD  gabapentin (NEURONTIN) 100 MG capsule Take 100 mg by mouth 2 (two) times daily as needed (pain). 03/04/21  Yes [provider]  lamoTRIgine (LAMICTAL) 150 MG tablet Take 150 mg by mouth at bedtime.   Yes [provider]  levothyroxine (SYNTHROID) 25 MCG tablet Take 1 tablet (25 mcg total) by mouth daily. 12/13/21  Yes Paseda, Dewaine Conger, FNP  losartan (COZAAR) 50 MG tablet TAKE (1) TABLET BY MOUTH ONCE DAILY. 06/27/22  Yes Fay Records, MD  meloxicam (MOBIC) 7.5 MG tablet TAKE 1 TABLET BY MOUTH TWICE DAILY AS NEEDED FOR PAIN. 01/31/22  Yes Aundra Dubin, PA-C  Multiple Vitamin (MULTIVITAMIN) tablet Take 1 tablet by mouth daily.   Yes [provider]  niacin (SLO-NIACIN) 500 MG tablet Take 500 mg by mouth daily.   Yes [provider]  nitroGLYCERIN (NITROSTAT) 0.4 MG SL tablet PLACE 1 TAB UNDER TONGUE EVERY 5 MIN IF NEEDED FOR  CHEST PAIN. MAY USE 3 TIMES.NO RELIEF CALL 911. 11/24/21  Yes Fay Records, MD  Omega-3 Fatty Acids (FISH OIL) 1000 MG CAPS Take 1,000 mg by mouth daily.   Yes [provider]  pantoprazole (PROTONIX) 40 MG tablet Take 1 tablet (40 mg total) by mouth daily. 07/06/22  Yes Rourk, Cristopher Estimable, MD  Probiotic Product (PROBIOTIC DAILY PO) Take 1 tablet by mouth daily.    Yes [provider]  Propylene Glycol (SYSTANE BALANCE) 0.6 % SOLN Place 1 drop into both eyes 2 (two) times daily as needed (dry eyes).   Yes [provider]  risperiDONE (RISPERDAL) 1 MG tablet Take 1 mg by mouth at bedtime.   Yes [provider]  WELLBUTRIN XL 300 MG 24 hr tablet Take 300 mg by mouth daily.  07/08/14  Yes [provider]  polyethylene glycol-electrolytes (NULYTELY) 420 g solution As directed Patient not taking: Reported on 08/08/2022 05/28/22   Rourk, Cristopher Estimable, MD    Inpatient medications:  aspirin EC  81 mg Oral Daily   atorvastatin  40 mg Oral Daily   buPROPion  300 mg Oral Daily   Chlorhexidine Gluconate Cloth  6 each Topical Q0600   DULoxetine  120 mg Oral Daily   guaiFENesin  600 mg Oral BID   heparin  5,000 Units Subcutaneous Q8H   lamoTRIgine  150 mg Oral QHS   levothyroxine  25 mcg Oral Daily   pantoprazole  40 mg Oral Daily   risperiDONE  1 mg Oral QHS    Discontinued Meds:   Medications Discontinued During This Encounter  Medication Reason   lactated ringers infusion    ceFEPIme (MAXIPIME) 1 g in sodium chloride 0.9 % 100 mL IVPB    vancomycin (VANCOREADY) IVPB 500 mg/100 mL    ipratropium-albuterol (DUONEB) 0.5-2.5 (3) MG/3ML nebulizer solution 3 mL     Social History:  reports that she has never smoked. She has never used smokeless tobacco. She reports current alcohol use of about 5.0 standard drinks of alcohol per week. She reports  that she does not use drugs.  Family History:   Family History  Problem Relation Age of Onset   Heart disease Mother     Heart attack Mother    Heart disease Father    Heart attack Father    Stroke Sister    Heart disease Sister        CABG   Stomach cancer Maternal Aunt    Breast cancer Maternal Aunt    Stroke Maternal Grandfather    Cancer Paternal Grandmother        unknown primary   Colon cancer Neg Hx    Cervical cancer Neg Hx     Pertinent items are noted in HPI. Weight change:   Intake/Output Summary (Last 24 hours) at 07/17/2022 1144 Last data filed at 07/17/2022 0711 Gross per 24 hour  Intake 3446.46 ml  Output 820 ml  Net 2626.46 ml   BP (!) 94/52   Pulse 97   Temp 97.9 F (36.6 C) (Oral)   Resp 20   Ht '4\' 8"'$  (1.422 m)   Wt 59.1 kg   LMP 02/10/1993   SpO2 94%   BMI 29.21 kg/m  Vitals:   07/17/22 0400 07/17/22 0500 07/17/22 0740 07/17/22 1115  BP:      Pulse:   98 97  Resp:   17 20  Temp: 98.7 F (37.1 C)  (!) 97.4 F (36.3 C) 97.9 F (36.6 C)  TempSrc: Oral  Oral Oral  SpO2:   95% 94%  Weight:  59.1 kg    Height:         General appearance: ill-appearing, fatigued Head: Normocephalic, without obvious abnormality, atraumatic Eyes: negative findings: lids and lashes normal, conjunctivae and sclerae normal, and corneas clear Resp: clear to auscultation bilaterally Cardio: regular rate and rhythm, S1, S2 normal, no murmur, click, rub or gallop GI: soft, non-tender; bowel sounds normal; no masses,  no organomegaly Extremities: extremities normal, atraumatic, no cyanosis or edema  Labs: Basic Metabolic Panel: Recent Labs  Lab 07/31/2022 1014 07/17/22 0437  NA 124* 123*  K 4.0 4.4  CL 90* 95*  CO2 24 21*  GLUCOSE 79 58*  BUN 47* 49*  CREATININE 3.87* 4.15*  ALBUMIN 2.6* 2.0*  CALCIUM 8.3* 7.5*   Liver Function Tests: Recent Labs  Lab 08/10/2022 1014 07/17/22 0437  AST 28 20  ALT 25 21  ALKPHOS 136* 132*  BILITOT 0.6 0.6  PROT 5.7* 4.8*  ALBUMIN 2.6* 2.0*   No results for input(s): "LIPASE", "AMYLASE" in the last 168 hours. No results for input(s):  "AMMONIA" in the last 168 hours. CBC: Recent Labs  Lab 07/30/2022 1014 07/17/22 0437  WBC 28.9* 25.8*  NEUTROABS 27.7*  --   HGB 10.9* 9.6*  HCT 31.8* 28.7*  MCV 96.7 98.3  PLT 177 151   PT/INR: '@LABRCNTIP'$ (inr:5) Cardiac Enzymes: )No results for input(s): "CKTOTAL", "CKMB", "CKMBINDEX", "TROPONINI" in the last 168 hours. CBG: Recent Labs  Lab 07/18/2022 0936  GLUCAP 69*    Iron Studies: No results for input(s): "IRON", "TIBC", "TRANSFERRIN", "FERRITIN" in the last 168 hours.  Xrays/Other Studies: CT RENAL STONE STUDY  Result Date: 07/27/2022 CLINICAL DATA:  Flank pain, abdominal pain EXAM: CT ABDOMEN AND PELVIS WITHOUT CONTRAST TECHNIQUE: Multidetector CT imaging of the abdomen and pelvis was performed following the standard protocol without IV contrast. RADIATION DOSE REDUCTION: This exam was performed according to the departmental dose-optimization program which includes automated exposure control, adjustment of the mA and/or kV according to patient size and/or  use of iterative reconstruction technique. COMPARISON:  07/12/2020 FINDINGS: Lower chest: There are linear patchy infiltrates in both lower lung fields, more so on the left side suggesting atelectasis/pneumonia. Minimal bilateral pleural effusions are seen. Coronary artery calcifications are seen. Hepatobiliary: No focal abnormalities are seen. There is no dilation of bile ducts. Gallbladder is distended. There is no wall thickening in gallbladder. Pancreas: Pancreas appears smaller than usual in size. No focal abnormalities are seen. Spleen: Unremarkable. Adrenals/Urinary Tract: Adrenals are unremarkable. There is no hydronephrosis. There are no renal or ureteral stones. Beam hardening artifacts from surgical hardware in the lumbar spine limited evaluation of the kidneys. Ureters are not dilated. Urinary bladder is not distended. Evaluation of bladder is limited by severe beam hardening artifact. Stomach/Bowel: Small hiatal hernia  is seen. There is no significant small bowel dilation. Appendix is not seen. There is no significant wall thickening in colon. Diverticula are seen in the colon without signs of focal diverticulitis. Vascular/Lymphatic: Scattered arterial calcifications are seen. Reproductive: Evaluation is limited by severe beam hardening artifact. Uterus is not seen. Other: There is no ascites or pneumoperitoneum. There is mild stranding in the mesentery without any loculated fluid collections or significant lymphadenopathy. Musculoskeletal: There is previous arthroplasty in both hips. There is posterior fusion at the L4-L5 level in lumbar spine. Degenerative changes are noted in lower thoracic spine and lumbar spine. There is significant interval worsening of degenerative changes at T12-L1 level with erosive changes in the adjacent endplates. No definite loculated paraspinal fluid collections are seen. IMPRESSION: Beam hardening artifacts from surgical hardware in lumbar spine and both hips limit the study. There is no hydronephrosis. There are no renal or ureteral stones. There is no evidence of intestinal obstruction or pneumoperitoneum. There is significant interval worsening of degenerative changes at T12-L1 level. Possibility of discitis is not excluded. Please correlate with clinical symptoms and laboratory findings. If there is clinical suspicion for discitis, follow-up MRI may be considered. Small bilateral pleural effusions, more so on the left side. There are patchy infiltrates in both lower lung fields suggesting atelectasis/pneumonia. Small hiatal hernia. Diverticulosis of colon. There is mild stranding in the mesentery without any loculated fluid collections or significant lymphadenopathy. This may be a normal variation or suggest nonspecific mesenteric inflammation. Electronically Signed   By: Elmer Picker M.D.   On: 07/28/2022 13:32   DG Hips Bilat W or Wo Pelvis 3-4 Views  Result Date:  07/26/2022 CLINICAL DATA:  Fall. EXAM: DG HIP (WITH OR WITHOUT PELVIS) 3-4V BILAT COMPARISON:  October 11, 2021. FINDINGS: Status post bilateral total hip arthroplasties. Sacroiliac joints are unremarkable. No fracture or dislocation is noted. IMPRESSION: No acute abnormality is noted. Electronically Signed   By: Marijo Conception M.D.   On: 07/20/2022 11:44   DG Chest Port 1 View  Result Date: 07/25/2022 CLINICAL DATA:  Sepsis. EXAM: PORTABLE CHEST 1 VIEW COMPARISON:  April 14, 2020. FINDINGS: Normal cardiac size. Right paratracheal prominence is noted concerning for adenopathy or mass. Status post left shoulder arthroplasty. Severe degenerative changes seen involving the right shoulder. Lungs are clear. IMPRESSION: Right paratracheal prominence is concerning for mass or adenopathy. CT scan of the chest with intravenous contrast is recommended for further evaluation. Electronically Signed   By: Marijo Conception M.D.   On: 07/24/2022 11:43   CT Head Wo Contrast  Result Date: 08/07/2022 CLINICAL DATA:  Increasing weakness.  Fall. EXAM: CT HEAD WITHOUT CONTRAST TECHNIQUE: Contiguous axial images were obtained from the base of the  skull through the vertex without intravenous contrast. RADIATION DOSE REDUCTION: This exam was performed according to the departmental dose-optimization program which includes automated exposure control, adjustment of the mA and/or kV according to patient size and/or use of iterative reconstruction technique. COMPARISON:  04/17/2020 FINDINGS: Brain: There is no evidence for acute hemorrhage, hydrocephalus, mass lesion, or abnormal extra-axial fluid collection. No definite CT evidence for acute infarction. Diffuse loss of parenchymal volume is consistent with atrophy. Patchy low attenuation in the deep hemispheric and periventricular white matter is nonspecific, but likely reflects chronic microvascular ischemic demyelination. Vascular: No hyperdense vessel or unexpected calcification. Skull:  Normal. Negative for fracture or focal lesion. Sinuses/Orbits: No acute finding. Other: None. IMPRESSION: 1. No acute intracranial abnormality. 2. Atrophy with chronic small vessel ischemic disease. Electronically Signed   By: Misty Stanley M.D.   On: 07/23/2022 11:19     Assessment/Plan:  AKI - non-oliguric.  Presumably ischemic ATN in setting of sepsis, hypotension, and concomitant ARB and NSAID use.  CT scan negative for hydronephrosis.  UOP improving with IVF's.  Continue to hold ARB and NSAID.  No urgent indication for dialysis at this time. Will likely regain renal function since it was normal 3 weeks ago. Avoid nephrotoxic medications including NSAIDs and iodinated intravenous contrast exposure unless the latter is absolutely indicated.  Preferred narcotic agents for pain control are hydromorphone, fentanyl, and methadone. Morphine should not be used.  Avoid Baclofen and avoid oral sodium phosphate and magnesium citrate based laxatives / bowel preps.  Continue strict Input and Output monitoring. Will monitor the patient closely with you and intervene or adjust therapy as indicated by changes in clinical status/labs  Renal dose meds for GFR<15 ml/min Will send serologies for completeness given hematuria and proteinuria on UA. Hyponatremia - presumably due to AKI and hypovolemia.  Continue to follow serum sodium levels.  Will recheck bmet now since last one obtained 8 hours ago Sepsis - likely due to urinary source.  Blood cultures + for GNR and likely E. Coli  (detected on blood cx ID panel along with enterobacterales).  Currently on IV ceftriaxone and azithromycin. Hypoglycemia - glucose 58 this morning.  Need to recheck.  Right paratracheal prominence - seen on CXR will need CT scan in future. HTN - off meds due to hypotension at this time.    Governor Rooks Ethell Blatchford 07/17/2022, 11:44 AM

## 2022-07-18 DIAGNOSIS — E871 Hypo-osmolality and hyponatremia: Secondary | ICD-10-CM | POA: Diagnosis not present

## 2022-07-18 DIAGNOSIS — I10 Essential (primary) hypertension: Secondary | ICD-10-CM

## 2022-07-18 DIAGNOSIS — A419 Sepsis, unspecified organism: Secondary | ICD-10-CM | POA: Diagnosis not present

## 2022-07-18 DIAGNOSIS — N179 Acute kidney failure, unspecified: Secondary | ICD-10-CM | POA: Diagnosis not present

## 2022-07-18 LAB — RENAL FUNCTION PANEL
Albumin: 2.8 g/dL — ABNORMAL LOW (ref 3.5–5.0)
Anion gap: 8 (ref 5–15)
BUN: 56 mg/dL — ABNORMAL HIGH (ref 8–23)
CO2: 19 mmol/L — ABNORMAL LOW (ref 22–32)
Calcium: 7.6 mg/dL — ABNORMAL LOW (ref 8.9–10.3)
Chloride: 96 mmol/L — ABNORMAL LOW (ref 98–111)
Creatinine, Ser: 4.67 mg/dL — ABNORMAL HIGH (ref 0.44–1.00)
GFR, Estimated: 9 mL/min — ABNORMAL LOW (ref >60)
Glucose, Bld: 83 mg/dL (ref 70–99)
Phosphorus: 2.9 mg/dL (ref 2.5–4.6)
Potassium: 4.3 mmol/L (ref 3.5–5.1)
Sodium: 123 mmol/L — ABNORMAL LOW (ref 135–145)

## 2022-07-18 LAB — GLUCOSE, CAPILLARY
Glucose-Capillary: 83 mg/dL (ref 70–99)
Glucose-Capillary: 69 mg/dL — ABNORMAL LOW (ref 70–99)
Glucose-Capillary: 111 mg/dL — ABNORMAL HIGH (ref 70–99)
Glucose-Capillary: 105 mg/dL — ABNORMAL HIGH (ref 70–99)

## 2022-07-18 LAB — CBC
HCT: 28.1 % — ABNORMAL LOW (ref 36.0–46.0)
Hemoglobin: 9.8 g/dL — ABNORMAL LOW (ref 12.0–15.0)
MCH: 33.4 pg (ref 26.0–34.0)
MCHC: 34.9 g/dL (ref 30.0–36.0)
MCV: 95.9 fL (ref 80.0–100.0)
Platelets: 142 10*3/uL — ABNORMAL LOW (ref 150–400)
RBC: 2.93 MIL/uL — ABNORMAL LOW (ref 3.87–5.11)
RDW: 14.6 % (ref 11.5–15.5)
WBC: 24.2 10*3/uL — ABNORMAL HIGH (ref 4.0–10.5)
nRBC: 0.1 % (ref 0.0–0.2)

## 2022-07-18 LAB — URINE CULTURE: Culture: >=100000 — AB

## 2022-07-18 LAB — C4 COMPLEMENT: Complement C4, Body Fluid: 23 mg/dL (ref 12–38)

## 2022-07-18 LAB — GLOMERULAR BASEMENT MEMBRANE ANTIBODIES: GBM Ab: <0.2 units (ref 0.0–0.9)

## 2022-07-18 LAB — LEGIONELLA PNEUMOPHILA SEROGP 1 UR AG: L. pneumophila Serogp 1 Ur Ag: NEGATIVE

## 2022-07-18 LAB — ANA: Anti Nuclear Antibody (ANA): NEGATIVE

## 2022-07-18 LAB — ANTI-DNA ANTIBODY, DOUBLE-STRANDED: ds DNA Ab: 6 IU/mL (ref 0–9)

## 2022-07-18 LAB — C3 COMPLEMENT: C3 Complement: 123 mg/dL (ref 82–167)

## 2022-07-18 MED ORDER — DEXTROSE 50 % IV SOLN
INTRAVENOUS | Status: AC
  Administered 2022-07-18: 25 mL
  Filled 2022-07-18: qty 50

## 2022-07-18 MED ORDER — LORAZEPAM 2 MG/ML IJ SOLN
0.5000 mg | Freq: Four times a day (QID) | INTRAMUSCULAR | Status: DC | PRN
  Administered 2022-07-18 – 2022-07-19 (×3): 0.5 mg via INTRAVENOUS
  Filled 2022-07-18 (×3): qty 1

## 2022-07-18 MED ORDER — DEXTROSE 50 % IV SOLN: 25.0000 mL | Freq: Once | INTRAVENOUS | Status: AC

## 2022-07-18 MED ORDER — SODIUM CHLORIDE 0.9 % IV BOLUS
1000.0000 mL | Freq: Once | INTRAVENOUS | Status: AC
  Administered 2022-07-18: 1000 mL via INTRAVENOUS

## 2022-07-18 MED ORDER — PANTOPRAZOLE SODIUM 40 MG IV SOLR
40.0000 mg | INTRAVENOUS | Status: DC
  Administered 2022-07-18: 40 mg via INTRAVENOUS
  Filled 2022-07-18: qty 10

## 2022-07-18 MED ORDER — DEXTROSE-NACL 5-0.9 % IV SOLN: INTRAVENOUS | Status: DC

## 2022-07-18 NOTE — Progress Notes (Signed)
Patient ID: [name] [name], female   DOB: March 07, 1941, 81 y.o.   MRN: [id number] S: More confused this morning.  Daughter at bedside with brother.  She states her mom declined significantly last night and is unable to communicate this morning. O:BP (!) 92/48   Pulse 92   Temp 97.8 F (36.6 C) (Oral)   Resp 13   Ht '4\' 8"'$  (1.422 m)   Wt 59.1 kg   LMP 02/10/1993   SpO2 97%   BMI 29.21 kg/m   Intake/Output Summary (Last 24 hours) at 07/18/2022 1106 Last data filed at 07/18/2022 0800 Gross per 24 hour  Intake 1739.46 ml  Output 670 ml  Net 1069.46 ml   Intake/Output: I/O last 3 completed shifts: In: 1400.3 [I.V.:864.7; IV Piggyback:535.6] Out: 3716 [Urine:1445]  Intake/Output this shift:  Total I/O In: 524.8 [I.V.:524.8] Out: 45 [Urine:45] Weight change:  RCV:[id number] ill-appearing CVS: RRR, no rub Resp: CTA Abd: +BS, soft, NT/ND Ext: no edema  Recent Labs  Lab 08/05/2022 1014 07/17/22 0437 07/17/22 1311 07/18/22 0413  NA 124* 123* 121* 123*  K 4.0 4.4 4.8 4.3  CL 90* 95* 92* 96*  CO2 24 21* 20* 19*  GLUCOSE 79 58* 120* 83  BUN 47* 49* 51* 56*  CREATININE 3.87* 4.15* 4.44* 4.67*  ALBUMIN 2.6* 2.0*  --  2.8*  CALCIUM 8.3* 7.5* 7.7* 7.6*  PHOS  --   --   --  2.9  AST 28 20  --   --   ALT 25 21  --   --    Liver Function Tests: Recent Labs  Lab 07/17/2022 1014 07/17/22 0437 07/18/22 0413  AST 28 20  --   ALT 25 21  --   ALKPHOS 136* 132*  --   BILITOT 0.6 0.6  --   PROT 5.7* 4.8*  --   ALBUMIN 2.6* 2.0* 2.8*   No results for input(s): "LIPASE", "AMYLASE" in the last 168 hours. No results for input(s): "AMMONIA" in the last 168 hours. CBC: Recent Labs  Lab 08/08/2022 1014 07/17/22 0437 07/18/22 0413  WBC 28.9* 25.8* 24.2*  NEUTROABS 27.7*  --   --   HGB 10.9* 9.6* 9.8*  HCT 31.8* 28.7* 28.1*  MCV 96.7 98.3 95.9  PLT 177 151 142*   Cardiac Enzymes: No results for input(s): "CKTOTAL", "CKMB", "CKMBINDEX", "TROPONINI" in the last 168 hours. CBG: Recent  Labs  Lab 08/06/2022 0936 07/17/22 1222 07/17/22 2347 07/18/22 0515  GLUCAP 69* 142* 89 83    Iron Studies: No results for input(s): "IRON", "TIBC", "TRANSFERRIN", "FERRITIN" in the last 72 hours. Studies/Results: CT RENAL STONE STUDY  Result Date: 07/31/2022 CLINICAL DATA:  Flank pain, abdominal pain EXAM: CT ABDOMEN AND PELVIS WITHOUT CONTRAST TECHNIQUE: Multidetector CT imaging of the abdomen and pelvis was performed following the standard protocol without IV contrast. RADIATION DOSE REDUCTION: This exam was performed according to the departmental dose-optimization program which includes automated exposure control, adjustment of the mA and/or kV according to patient size and/or use of iterative reconstruction technique. COMPARISON:  07/12/2020 FINDINGS: Lower chest: There are linear patchy infiltrates in both lower lung fields, more so on the left side suggesting atelectasis/pneumonia. Minimal bilateral pleural effusions are seen. Coronary artery calcifications are seen. Hepatobiliary: No focal abnormalities are seen. There is no dilation of bile ducts. Gallbladder is distended. There is no wall thickening in gallbladder. Pancreas: Pancreas appears smaller than usual in size. No focal abnormalities are seen. Spleen: Unremarkable. Adrenals/Urinary Tract: Adrenals are unremarkable. There is  no hydronephrosis. There are no renal or ureteral stones. Beam hardening artifacts from surgical hardware in the lumbar spine limited evaluation of the kidneys. Ureters are not dilated. Urinary bladder is not distended. Evaluation of bladder is limited by severe beam hardening artifact. Stomach/Bowel: Small hiatal hernia is seen. There is no significant small bowel dilation. Appendix is not seen. There is no significant wall thickening in colon. Diverticula are seen in the colon without signs of focal diverticulitis. Vascular/Lymphatic: Scattered arterial calcifications are seen. Reproductive: Evaluation is limited by  severe beam hardening artifact. Uterus is not seen. Other: There is no ascites or pneumoperitoneum. There is mild stranding in the mesentery without any loculated fluid collections or significant lymphadenopathy. Musculoskeletal: There is previous arthroplasty in both hips. There is posterior fusion at the L4-L5 level in lumbar spine. Degenerative changes are noted in lower thoracic spine and lumbar spine. There is significant interval worsening of degenerative changes at T12-L1 level with erosive changes in the adjacent endplates. No definite loculated paraspinal fluid collections are seen. IMPRESSION: Beam hardening artifacts from surgical hardware in lumbar spine and both hips limit the study. There is no hydronephrosis. There are no renal or ureteral stones. There is no evidence of intestinal obstruction or pneumoperitoneum. There is significant interval worsening of degenerative changes at T12-L1 level. Possibility of discitis is not excluded. Please correlate with clinical symptoms and laboratory findings. If there is clinical suspicion for discitis, follow-up MRI may be considered. Small bilateral pleural effusions, more so on the left side. There are patchy infiltrates in both lower lung fields suggesting atelectasis/pneumonia. Small hiatal hernia. Diverticulosis of colon. There is mild stranding in the mesentery without any loculated fluid collections or significant lymphadenopathy. This may be a normal variation or suggest nonspecific mesenteric inflammation. Electronically Signed   By: Elmer Picker M.D.   On:  13:32   DG Hips Bilat W or Wo Pelvis 3-4 Views  Result Date: 07/26/2022 CLINICAL DATA:  Fall. EXAM: DG HIP (WITH OR WITHOUT PELVIS) 3-4V BILAT COMPARISON:  October 11, 2021. FINDINGS: Status post bilateral total hip arthroplasties. Sacroiliac joints are unremarkable. No fracture or dislocation is noted. IMPRESSION: No acute abnormality is noted. Electronically Signed   By: Marijo Conception M.D.   On: 07/17/2022 11:44   DG Chest Port 1 View  Result Date: 07/23/2022 CLINICAL DATA:  Sepsis. EXAM: PORTABLE CHEST 1 VIEW COMPARISON:  April 14, 2020. FINDINGS: Normal cardiac size. Right paratracheal prominence is noted concerning for adenopathy or mass. Status post left shoulder arthroplasty. Severe degenerative changes seen involving the right shoulder. Lungs are clear. IMPRESSION: Right paratracheal prominence is concerning for mass or adenopathy. CT scan of the chest with intravenous contrast is recommended for further evaluation. Electronically Signed   By: Marijo Conception M.D.   On: 07/18/2022 11:43   CT Head Wo Contrast  Result Date: 08/07/2022 CLINICAL DATA:  Increasing weakness.  Fall. EXAM: CT HEAD WITHOUT CONTRAST TECHNIQUE: Contiguous axial images were obtained from the base of the skull through the vertex without intravenous contrast. RADIATION DOSE REDUCTION: This exam was performed according to the departmental dose-optimization program which includes automated exposure control, adjustment of the mA and/or kV according to patient size and/or use of iterative reconstruction technique. COMPARISON:  04/17/2020 FINDINGS: Brain: There is no evidence for acute hemorrhage, hydrocephalus, mass lesion, or abnormal extra-axial fluid collection. No definite CT evidence for acute infarction. Diffuse loss of parenchymal volume is consistent with atrophy. Patchy low attenuation in the deep hemispheric and  periventricular white matter is nonspecific, but likely reflects chronic microvascular ischemic demyelination. Vascular: No hyperdense vessel or unexpected calcification. Skull: Normal. Negative for fracture or focal lesion. Sinuses/Orbits: No acute finding. Other: None. IMPRESSION: 1. No acute intracranial abnormality. 2. Atrophy with chronic small vessel ischemic disease. Electronically Signed   By: Misty Stanley M.D.   On:  11:19    aspirin EC  81 mg Oral Daily   atorvastatin   40 mg Oral Daily   Chlorhexidine Gluconate Cloth  6 each Topical Q0600   guaiFENesin  600 mg Oral BID   heparin  5,000 Units Subcutaneous Q8H   lamoTRIgine  150 mg Oral QHS   levothyroxine  25 mcg Oral Daily   pantoprazole (PROTONIX) IV  40 mg Intravenous Q24H   polyethylene glycol  17 g Oral Daily   risperiDONE  1 mg Oral QHS   saccharomyces boulardii  250 mg Oral BID    BMET    Component Value Date/Time   NA 123 (L) 07/18/2022 0413   NA 139 06/26/2022 1050   K 4.3 07/18/2022 0413   CL 96 (L) 07/18/2022 0413   CO2 19 (L) 07/18/2022 0413   GLUCOSE 83 07/18/2022 0413   BUN 56 (H) 07/18/2022 0413   BUN 12 06/26/2022 1050   CREATININE 4.67 (H) 07/18/2022 0413   CREATININE 0.70 07/27/2013 1708   CALCIUM 7.6 (L) 07/18/2022 0413   GFRNONAA 9 (L) 07/18/2022 0413   GFRAA >60 07/12/2020 1032   CBC    Component Value Date/Time   WBC 24.2 (H) 07/18/2022 0413   RBC 2.93 (L) 07/18/2022 0413   HGB 9.8 (L) 07/18/2022 0413   HGB 13.7 06/26/2022 1050   HCT 28.1 (L) 07/18/2022 0413   HCT 39.5 06/26/2022 1050   PLT 142 (L) 07/18/2022 0413   PLT 235 06/26/2022 1050   MCV 95.9 07/18/2022 0413   MCV 96 06/26/2022 1050   MCH 33.4 07/18/2022 0413   MCHC 34.9 07/18/2022 0413   RDW 14.6 07/18/2022 0413   RDW 12.0 06/26/2022 1050   LYMPHSABS 0.0 (L) 07/14/2022 1014   LYMPHSABS 1.4 06/29/2021 0922   MONOABS 0.6 07/15/2022 1014   EOSABS 0.6 (H) 07/29/2022 1014   EOSABS 0.3 06/29/2021 0922   BASOSABS 0.0 08/09/2022 1014   BASOSABS 0.1 06/29/2021 0922    Assessment/Plan:  AKI - non-oliguric.  Presumably ischemic ATN in setting of sepsis, hypotension, and concomitant ARB and NSAID use.  CT scan negative for hydronephrosis.  UOP improving with IVF's and had 845 overnight but not drinking on her own this morning.  Continue to hold ARB and NSAID.  No urgent indication for dialysis at this time. BP is too low for IHD at this time and if she doesn't start to show improvement, we may need to  transfer to [facility] for CVVHD.  Will hopefully regain renal function since it was normal 3 weeks ago. Avoid nephrotoxic medications including NSAIDs and iodinated intravenous contrast exposure unless the latter is absolutely indicated.  Preferred narcotic agents for pain control are hydromorphone, fentanyl, and methadone. Morphine should not be used.  Avoid Baclofen and avoid oral sodium phosphate and magnesium citrate based laxatives / bowel preps.  Continue strict Input and Output monitoring. Will monitor the patient closely with you and intervene or adjust therapy as indicated by changes in clinical status/labs  Renal dose meds for GFR<15 ml/min Will send serologies for completeness given hematuria and proteinuria on UA. Increase IVF's to 150 mL/hr since she is not taking  anything po at this time. Hyponatremia - presumably due to AKI and hypovolemia.  Continue to follow serum sodium levels.  Will recheck bmet now since last one obtained 8 hours ago Sepsis - likely due to urinary source.  Blood cultures + for GNR and likely E. Coli  (detected on blood cx ID panel along with enterobacterales).  Currently on IV ceftriaxone and azithromycin.  BP's dropping and may require pressors.  Awaiting final sensitivities from blood cx.  Urine cx with E. Coli resistant to amoxicillin only.  AMS - likely delirium from sepsis.  Doubt due to ceftriaxone.  Hypoglycemia - glucose 58 this morning.  Need to recheck.  Right paratracheal prominence - seen on CXR will need CT scan in future. HTN - off meds due to hypotension at this time.   Donetta Potts, MD [facility]

## 2022-07-18 NOTE — Progress Notes (Signed)
SLP Cancellation Note  Patient Details Name: [name] [name] MRN: [id number] DOB: [birth year]   Cancelled treatment:       Reason Eval/Treat Not Completed: Fatigue/lethargy limiting ability to participate. Pt was recently given Ativan and is "finally resting". ST will continue efforts. This SLP provided oral care materials for family and reviewed importance of oral care with them. Thank you,  Shy Guallpa H. Roddie Mc, CCC-SLP Speech Language Pathologist    Wende Bushy 07/18/2022, 12:14 PM

## 2022-07-18 NOTE — Progress Notes (Signed)
PROGRESS NOTE    [name] [name]  YTK:[id number] DOB: 14-Jul-1941 DOA: 07/23/2022 PCP: Renee Rival, FNP    Brief Narrative:  81 y.o. female with medical history significant of hypertension, bipolar disorder, hyperlipidemia, hypothyroidism who lives at home, has been experiencing generalized weakness for the past week.  Last night, she suffered a fall.  Cannot tell me the circumstances around the fall.  She does complain of some cough and sore throat.  She has not had any nausea or vomiting.  She has not had any diarrhea.  Denies any dysuria.  She has chronic back pain which she reported is unchanged.  On arrival to the emergency room, noted to be mildly hypoxic and hypotensive.  Supplemental oxygen was applied and she was started on IV fluids.  She is also noted to be hypoglycemic.  Urinalysis indicates possible infection.  Further imaging also indicates possible bilateral pneumonia.  She has been referred for admission.   Assessment & Plan:   Principal Problem:   Sepsis ([place]) Active Problems:   Hyperlipidemia   HTN (hypertension)   Bipolar disorder ([facility])   Pneumonia   UTI (urinary tract infection)   AKI (acute kidney injury) ([place])   Hyponatremia   E. coli sepsis/bacteremia due to urinary and pulmonary source -Urine culture and blood cultures from 08/09/2022 with mostly pansensitive E. coli -Patient met sepsis criteria on admission -Evidence of poor tissue perfusion persist including metabolic encephalopathy, worsening AKI, and soft BP -Continue ceftriaxone and azithromycin for both pneumonia and UTI -Check sputum culture and urinary antigens -Respiratory viral panel negative -Continue pulmonary hygiene -Continue aggressive IV fluids for pressure support   Acute kidney injury -Hold further ARB -No evidence of hydronephrosis on CT imaging -Continue IV fluids and monitor urine output -Baseline creatinine 0.7 -Admission creatinine 3.8 -Creatinine continues to trend  up -Nephrology consult appreciated   Hyponatremia -Likely related to diminished p.o. intake/dehydration -Continue on saline infusion   Right paratracheal prominence -We will need further imaging with CT chest with IV contrast once renal function has recovered   Bipolar disorder Hold home regimen due to significant encephalopathy and poor oral intake   Hyperlipidemia -Hold statin until oral intake resumes   Hypertension -Holding antihypertensives for now while blood pressures are soft   Hypothyroidism -Hold until oral intake resumes Synthroid, consider switching to IV route at 50% dose in 48 to 72 hours  Hypoglycemia -Recurrent hypoglycemic episode due to poor oral intake start IV dextrose solution  Social/ethics--- plan of care and overall prognosis discussed with patient's son and patient's daughter at bedside, patient's daughter-in-law is at bedside -Patient's son-in-law was on the speaker phone --Family request DNR/DNI status, no further limitations to treatment at this time  Acute metabolic encephalopathy--- multifactorial in the setting of sepsis from urinary and pulmonary source, AKI,, hypoglycemia, hyponatremia and sedative medication use   DVT prophylaxis: heparin injection 5,000 Units Start: 07/13/2022 1730  Code Status: Full code Family Communication: Discussed with patient's daughter at the bedside Disposition Plan: Status is: Inpatient Remains inpatient appropriate because: Continued management of sepsis   Consultants:  Nephrology  Procedures:    Antimicrobials:  Ceftriaxone 9/4 > Azithromycin 9/4 >   Subjective: -Significant encephalopathy, hypotension, poor urine output, hypoxia requiring oxygen supplementation -Decompensated and deteriorated over the last 24 hours -Family members at bedside  Objective: Vitals:   07/18/22 1500 07/18/22 1543 07/18/22 1630 07/18/22 1700  BP: 107/65 (!) 125/101 (!) 124/98 (!) 124/43  Pulse: 95 100 99 99  Resp: (!)  31  20 (!) 22 (!) 23  Temp:      TempSrc:      SpO2: 97% 95% 96% 94%  Weight:      Height:        Intake/Output Summary (Last 24 hours) at 07/18/2022 1812 Last data filed at 07/18/2022 1700 Gross per 24 hour  Intake 3885.65 ml  Output 570 ml  Net 3315.65 ml   Filed Weights   07/18/2022 0938 08/11/2022 1336 07/17/22 0500  Weight: 51.3 kg 55.7 kg 59.1 kg    Examination:   Physical Exam  Gen:-Use, disoriented and lethargic  HEENT:- [place].AT, No sclera icterus Nose- NC3.5L/min Neck-Supple Neck,No JVD,.  Lungs-diminished breath sounds, scattered rales CV- S1, S2 normal, RRR Abd-  +ve B.Sounds, Abd Soft, No tenderness,    Extremity/Skin:- No  edema,   good pedal pulses  Neuro-Psych-very disoriented, restless, becomes lethargic after medication -Incoherent,   Data Reviewed: I have personally reviewed following labs and imaging studies  CBC: Recent Labs  Lab 08/05/2022 1014 07/17/22 0437 07/18/22 0413  WBC 28.9* 25.8* 24.2*  NEUTROABS 27.7*  --   --   HGB 10.9* 9.6* 9.8*  HCT 31.8* 28.7* 28.1*  MCV 96.7 98.3 95.9  PLT 177 151 664*   Basic Metabolic Panel: Recent Labs  Lab 08/08/2022 1014 07/17/22 0437 07/17/22 1311 07/18/22 0413  NA 124* 123* 121* 123*  K 4.0 4.4 4.8 4.3  CL 90* 95* 92* 96*  CO2 24 21* 20* 19*  GLUCOSE 79 58* 120* 83  BUN 47* 49* 51* 56*  CREATININE 3.87* 4.15* 4.44* 4.67*  CALCIUM 8.3* 7.5* 7.7* 7.6*  PHOS  --   --   --  2.9   GFR: Estimated Creatinine Clearance: 7.1 mL/min (A) (by C-G formula based on SCr of 4.67 mg/dL (H)). Liver Function Tests: Recent Labs  Lab 07/15/2022 1014 07/17/22 0437 07/18/22 0413  AST 28 20  --   ALT 25 21  --   ALKPHOS 136* 132*  --   BILITOT 0.6 0.6  --   PROT 5.7* 4.8*  --   ALBUMIN 2.6* 2.0* 2.8*   No results for input(s): "LIPASE", "AMYLASE" in the last 168 hours. No results for input(s): "AMMONIA" in the last 168 hours. Coagulation Profile: Recent Labs  Lab 08/06/2022 1014  INR 1.1   Cardiac  Enzymes: No results for input(s): "CKTOTAL", "CKMB", "CKMBINDEX", "TROPONINI" in the last 168 hours. BNP (last 3 results) No results for input(s): "PROBNP" in the last 8760 hours. HbA1C: No results for input(s): "HGBA1C" in the last 72 hours. CBG: Recent Labs  Lab 07/17/22 1222 07/17/22 2347 07/18/22 0515 07/18/22 1135 07/18/22 1331  GLUCAP 142* 89 83 69* 111*   Lipid Profile: No results for input(s): "CHOL", "HDL", "LDLCALC", "TRIG", "CHOLHDL", "LDLDIRECT" in the last 72 hours. Thyroid Function Tests: No results for input(s): "TSH", "T4TOTAL", "FREET4", "T3FREE", "THYROIDAB" in the last 72 hours. Anemia Panel: No results for input(s): "VITAMINB12", "FOLATE", "FERRITIN", "TIBC", "IRON", "RETICCTPCT" in the last 72 hours. Sepsis Labs: Recent Labs  Lab 08/09/2022 1014 08/10/2022 1157  LATICACIDVEN 1.5 1.4    Recent Results (from the past 240 hour(s))  Resp Panel by RT-PCR (Flu A&B, Covid) Anterior Nasal Swab     Status: None   Collection Time: 08/07/2022 10:14 AM   Specimen: Anterior Nasal Swab  Result Value Ref Range Status   SARS Coronavirus 2 by RT PCR NEGATIVE NEGATIVE Final    Comment: (NOTE) SARS-CoV-2 target nucleic acids are NOT DETECTED.  The SARS-CoV-2 RNA is  generally detectable in upper respiratory specimens during the acute phase of infection. The lowest concentration of SARS-CoV-2 viral copies this assay can detect is 138 copies/mL. A negative result does not preclude SARS-Cov-2 infection and should not be used as the sole basis for treatment or other patient management decisions. A negative result may occur with  improper specimen collection/handling, submission of specimen other than nasopharyngeal swab, presence of viral mutation(s) within the areas targeted by this assay, and inadequate number of viral copies(<138 copies/mL). A negative result must be combined with clinical observations, patient history, and epidemiological information. The expected result  is Negative.  Fact Sheet for Patients:  EntrepreneurPulse.com.au  Fact Sheet for Healthcare Providers:  IncredibleEmployment.be  This test is no t yet approved or cleared by the [place] FDA and  has been authorized for detection and/or diagnosis of SARS-CoV-2 by FDA under an Emergency Use Authorization (EUA). This EUA will remain  in effect (meaning this test can be used) for the duration of the COVID-19 declaration under Section 564(b)(1) of the Act, 21 U.S.C.section 360bbb-3(b)(1), unless the authorization is terminated  or revoked sooner.       Influenza A by PCR NEGATIVE NEGATIVE Final   Influenza B by PCR NEGATIVE NEGATIVE Final    Comment: (NOTE) The Xpert Xpress SARS-CoV-2/FLU/RSV plus assay is intended as an aid in the diagnosis of influenza from Nasopharyngeal swab specimens and should not be used as a sole basis for treatment. Nasal washings and aspirates are unacceptable for Xpert Xpress SARS-CoV-2/FLU/RSV testing.  Fact Sheet for Patients: EntrepreneurPulse.com.au  Fact Sheet for Healthcare Providers: IncredibleEmployment.be  This test is not yet approved or cleared by the [place] FDA and has been authorized for detection and/or diagnosis of SARS-CoV-2 by FDA under an Emergency Use Authorization (EUA). This EUA will remain in effect (meaning this test can be used) for the duration of the COVID-19 declaration under Section 564(b)(1) of the Act, 21 U.S.C. section 360bbb-3(b)(1), unless the authorization is terminated or revoked.  Performed at [facility], [address], [place], [place] [address]   Blood Culture (routine x 2)     Status: Abnormal (Preliminary result)   Collection Time: 07/18/2022 10:14 AM   Specimen: Right Antecubital; Blood  Result Value Ref Range Status   Specimen Description   Final    RIGHT ANTECUBITAL BOTTLES DRAWN AEROBIC AND ANAEROBIC Performed at [facility], [address] [place], [place] [address]    Special Requests   Final    Blood Culture adequate volume Performed at [facility], [address], [place], [place] [address]    Culture  Setup Time   Final    GRAM NEGATIVE RODS IN BOTH AEROBIC AND ANAEROBIC BOTTLES Gram Stain Report Called to,Read Back By and Verified With: R.ESTOCE @ 2222 BY MATTHEWS, B 9.4.2023 CRITICAL RESULT CALLED TO, READ BACK BY AND VERIFIED WITH: E TINAJERO,RN'@0215'  07/17/22 [place]    Culture (A)  Final    ESCHERICHIA COLI SUSCEPTIBILITIES TO FOLLOW Performed at [facility], [place] [address] [place], [place] [address]    Report Status PENDING  Incomplete  Urine Culture     Status: Abnormal   Collection Time: 07/20/2022 10:14 AM   Specimen: In/Out Cath Urine  Result Value Ref Range Status   Specimen Description   Final    IN/OUT CATH URINE Performed at [facility], [address] [place], [place] [address]    Special Requests   Final    NONE Performed at [facility], [address], [place], [place] [address]    Culture >=100,000 COLONIES/mL ESCHERICHIA COLI (A)  Final   Report Status 07/18/2022 FINAL  Final   Organism ID, Bacteria ESCHERICHIA COLI (A)  Final      Susceptibility   Escherichia coli - MIC*    AMPICILLIN >=32 RESISTANT Resistant     CEFAZOLIN <=4 SENSITIVE Sensitive     CEFEPIME <=0.12 SENSITIVE Sensitive     CEFTRIAXONE <=0.25 SENSITIVE Sensitive     CIPROFLOXACIN <=0.25 SENSITIVE Sensitive     GENTAMICIN <=1 SENSITIVE Sensitive     IMIPENEM <=0.25 SENSITIVE Sensitive     NITROFURANTOIN <=16 SENSITIVE Sensitive     TRIMETH/SULFA <=20 SENSITIVE Sensitive     AMPICILLIN/SULBACTAM 8 SENSITIVE Sensitive     PIP/TAZO <=4 SENSITIVE Sensitive     * >=100,000 COLONIES/mL ESCHERICHIA COLI  Blood Culture ID Panel (Reflexed)     Status: Abnormal   Collection Time: 08/09/2022 10:14 AM  Result Value Ref Range Status   Enterococcus faecalis NOT DETECTED NOT DETECTED Final   Enterococcus  Faecium NOT DETECTED NOT DETECTED Final   Listeria monocytogenes NOT DETECTED NOT DETECTED Final   Staphylococcus species NOT DETECTED NOT DETECTED Final   Staphylococcus aureus (BCID) NOT DETECTED NOT DETECTED Final   Staphylococcus epidermidis NOT DETECTED NOT DETECTED Final   Staphylococcus lugdunensis NOT DETECTED NOT DETECTED Final   Streptococcus species NOT DETECTED NOT DETECTED Final   Streptococcus agalactiae NOT DETECTED NOT DETECTED Final   Streptococcus pneumoniae NOT DETECTED NOT DETECTED Final   Streptococcus pyogenes NOT DETECTED NOT DETECTED Final   A.calcoaceticus-baumannii NOT DETECTED NOT DETECTED Final   Bacteroides fragilis NOT DETECTED NOT DETECTED Final   Enterobacterales DETECTED (A) NOT DETECTED Final    Comment: Enterobacterales represent a large order of gram negative bacteria, not a single organism. CRITICAL RESULT CALLED TO, READ BACK BY AND VERIFIED WITH: E TINAJERO,RN'@0216'  07/17/22 [place]    Enterobacter cloacae complex NOT DETECTED NOT DETECTED Final   Escherichia coli DETECTED (A) NOT DETECTED Final    Comment: CRITICAL RESULT CALLED TO, READ BACK BY AND VERIFIED WITH: E TINAJERO,RN'@0216'  07/17/22 [place]    Klebsiella aerogenes NOT DETECTED NOT DETECTED Final   Klebsiella oxytoca NOT DETECTED NOT DETECTED Final   Klebsiella pneumoniae NOT DETECTED NOT DETECTED Final   Proteus species NOT DETECTED NOT DETECTED Final   Salmonella species NOT DETECTED NOT DETECTED Final   Serratia marcescens NOT DETECTED NOT DETECTED Final   Haemophilus influenzae NOT DETECTED NOT DETECTED Final   Neisseria meningitidis NOT DETECTED NOT DETECTED Final   Pseudomonas aeruginosa NOT DETECTED NOT DETECTED Final   Stenotrophomonas maltophilia NOT DETECTED NOT DETECTED Final   Candida albicans NOT DETECTED NOT DETECTED Final   Candida auris NOT DETECTED NOT DETECTED Final   Candida glabrata NOT DETECTED NOT DETECTED Final   Candida krusei NOT DETECTED NOT DETECTED Final   Candida  parapsilosis NOT DETECTED NOT DETECTED Final   Candida tropicalis NOT DETECTED NOT DETECTED Final   Cryptococcus neoformans/gattii NOT DETECTED NOT DETECTED Final   CTX-M ESBL NOT DETECTED NOT DETECTED Final   Carbapenem resistance IMP NOT DETECTED NOT DETECTED Final   Carbapenem resistance KPC NOT DETECTED NOT DETECTED Final   Carbapenem resistance NDM NOT DETECTED NOT DETECTED Final   Carbapenem resist OXA 48 LIKE NOT DETECTED NOT DETECTED Final   Carbapenem resistance VIM NOT DETECTED NOT DETECTED Final    Comment: Performed at [facility], [address], [place], [place] [address]  Blood Culture (  routine x 2)     Status: Abnormal (Preliminary result)   Collection Time: 07/30/2022 10:19 AM   Specimen: BLOOD RIGHT WRIST  Result Value Ref Range Status   Specimen Description   Final    BLOOD RIGHT WRIST BOTTLES DRAWN AEROBIC AND ANAEROBIC Performed at [facility], [address], [place], [place] [address]    Special Requests   Final    Blood Culture adequate volume Performed at [facility], [address], [place], [place] [address]    Culture  Setup Time   Final    GRAM NEGATIVE RODS IN BOTH AEROBIC AND ANAEROBIC BOTTLES Gram Stain Report Called to,Read Back By and Verified With: R.[email or web address]'@2154'  BY MATTHEWS, B 9.4.2023 Performed at [place] [address], [place], [place] [address]    Culture ESCHERICHIA COLI (A)  Final   Report Status PENDING  Incomplete  MRSA Next Gen by PCR, Nasal     Status: None   Collection Time: 08/03/2022  4:51 PM   Specimen: Nasal Mucosa; Nasal Swab  Result Value Ref Range Status   MRSA by PCR Next Gen NOT DETECTED NOT DETECTED Final    Comment: (NOTE) The GeneXpert MRSA Assay (FDA approved for NASAL specimens only), is one component of a comprehensive MRSA colonization surveillance program. It is not intended to diagnose MRSA infection nor to guide or monitor treatment for MRSA infections. Test performance is not FDA approved in  patients less than 73 years old. Performed at [facility], [address], [place], [place] [address]   Respiratory (~20 pathogens) panel by PCR     Status: None   Collection Time: 07/17/2022  5:12 PM   Specimen: Nasopharyngeal Swab; Respiratory  Result Value Ref Range Status   Adenovirus NOT DETECTED NOT DETECTED Final   Coronavirus 229E NOT DETECTED NOT DETECTED Final    Comment: (NOTE) The Coronavirus on the Respiratory Panel, DOES NOT test for the novel  Coronavirus (2019 nCoV)    Coronavirus HKU1 NOT DETECTED NOT DETECTED Final   Coronavirus NL63 NOT DETECTED NOT DETECTED Final   Coronavirus OC43 NOT DETECTED NOT DETECTED Final   Metapneumovirus NOT DETECTED NOT DETECTED Final   Rhinovirus / Enterovirus NOT DETECTED NOT DETECTED Final   Influenza A NOT DETECTED NOT DETECTED Final   Influenza B NOT DETECTED NOT DETECTED Final   Parainfluenza Virus 1 NOT DETECTED NOT DETECTED Final   Parainfluenza Virus 2 NOT DETECTED NOT DETECTED Final   Parainfluenza Virus 3 NOT DETECTED NOT DETECTED Final   Parainfluenza Virus 4 NOT DETECTED NOT DETECTED Final   Respiratory Syncytial Virus NOT DETECTED NOT DETECTED Final   Bordetella pertussis NOT DETECTED NOT DETECTED Final   Bordetella Parapertussis NOT DETECTED NOT DETECTED Final   Chlamydophila pneumoniae NOT DETECTED NOT DETECTED Final   Mycoplasma pneumoniae NOT DETECTED NOT DETECTED Final    Comment: Performed at [facility], [place] [address] [place], [place] [address]     Radiology Studies: No results found.  Scheduled Meds:  aspirin EC  81 mg Oral Daily   atorvastatin  40 mg Oral Daily   Chlorhexidine Gluconate Cloth  6 each Topical Q0600   guaiFENesin  600 mg Oral BID   heparin  5,000 Units Subcutaneous Q8H   lamoTRIgine  150 mg Oral QHS   levothyroxine  25 mcg Oral Daily   pantoprazole (PROTONIX) IV  40 mg Intravenous Q24H   polyethylene glycol  17 g Oral Daily   risperiDONE  1 mg Oral QHS  saccharomyces boulardii   250 mg Oral BID   Continuous Infusions:  azithromycin 500 mg (07/18/22 1808)   cefTRIAXone (ROCEPHIN)  IV 2 g (07/18/22 1646)   dextrose 5 % and 0.9% NaCl 125 mL/hr at 07/18/22 1528     LOS: 2 days    Roxan Hockey, MD [facility]   If 7PM-7AM, please contact night-coverage [email or web address]  07/18/2022, 6:12 PM

## 2022-07-19 ENCOUNTER — Encounter: Payer: Self-pay | Admitting: Internal Medicine

## 2022-07-19 DIAGNOSIS — Z515 Encounter for palliative care: Secondary | ICD-10-CM | POA: Diagnosis not present

## 2022-07-19 DIAGNOSIS — N179 Acute kidney failure, unspecified: Secondary | ICD-10-CM | POA: Diagnosis not present

## 2022-07-19 DIAGNOSIS — A419 Sepsis, unspecified organism: Secondary | ICD-10-CM | POA: Diagnosis not present

## 2022-07-19 DIAGNOSIS — Z7189 Other specified counseling: Secondary | ICD-10-CM | POA: Diagnosis not present

## 2022-07-19 DIAGNOSIS — N3000 Acute cystitis without hematuria: Secondary | ICD-10-CM | POA: Diagnosis not present

## 2022-07-19 DIAGNOSIS — E871 Hypo-osmolality and hyponatremia: Secondary | ICD-10-CM | POA: Diagnosis not present

## 2022-07-19 LAB — CULTURE, BLOOD (ROUTINE X 2)
Special Requests: ADEQUATE
Special Requests: ADEQUATE

## 2022-07-19 LAB — RENAL FUNCTION PANEL
Albumin: 2.2 g/dL — ABNORMAL LOW (ref 3.5–5.0)
Anion gap: 7 (ref 5–15)
BUN: 55 mg/dL — ABNORMAL HIGH (ref 8–23)
CO2: 17 mmol/L — ABNORMAL LOW (ref 22–32)
Calcium: 7.3 mg/dL — ABNORMAL LOW (ref 8.9–10.3)
Chloride: 103 mmol/L (ref 98–111)
Creatinine, Ser: 4.98 mg/dL — ABNORMAL HIGH (ref 0.44–1.00)
GFR, Estimated: 8 mL/min — ABNORMAL LOW (ref >60)
Glucose, Bld: 142 mg/dL — ABNORMAL HIGH (ref 70–99)
Phosphorus: 3.3 mg/dL (ref 2.5–4.6)
Potassium: 4.2 mmol/L (ref 3.5–5.1)
Sodium: 127 mmol/L — ABNORMAL LOW (ref 135–145)

## 2022-07-19 LAB — CBC
HCT: 27 % — ABNORMAL LOW (ref 36.0–46.0)
Hemoglobin: 9 g/dL — ABNORMAL LOW (ref 12.0–15.0)
MCH: 33 pg (ref 26.0–34.0)
MCHC: 33.3 g/dL (ref 30.0–36.0)
MCV: 98.9 fL (ref 80.0–100.0)
Platelets: 139 10*3/uL — ABNORMAL LOW (ref 150–400)
RBC: 2.73 MIL/uL — ABNORMAL LOW (ref 3.87–5.11)
RDW: 15.8 % — ABNORMAL HIGH (ref 11.5–15.5)
WBC: 29.6 10*3/uL — ABNORMAL HIGH (ref 4.0–10.5)
nRBC: 0 % (ref 0.0–0.2)

## 2022-07-19 LAB — GLUCOSE, CAPILLARY
Glucose-Capillary: 111 mg/dL — ABNORMAL HIGH (ref 70–99)
Glucose-Capillary: 126 mg/dL — ABNORMAL HIGH (ref 70–99)

## 2022-07-19 LAB — ANCA TITERS
Atypical P-ANCA titer: <1:20 {titer}
C-ANCA: <1:20 {titer}
P-ANCA: <1:20 {titer}

## 2022-07-19 LAB — COMPLEMENT, TOTAL: Compl, Total (CH50): 45 U/mL (ref >41)

## 2022-07-19 MED ORDER — ONDANSETRON HCL 4 MG/2ML IJ SOLN: 4.0000 mg | Freq: Four times a day (QID) | INTRAMUSCULAR | Status: DC | PRN

## 2022-07-19 MED ORDER — SODIUM CHLORIDE 0.9% FLUSH: 3.0000 mL | INTRAVENOUS | Status: DC | PRN

## 2022-07-19 MED ORDER — BIOTENE DRY MOUTH MT LIQD: 15.0000 mL | OROMUCOSAL | Status: DC | PRN

## 2022-07-19 MED ORDER — DIPHENHYDRAMINE HCL 50 MG/ML IJ SOLN: 12.5000 mg | INTRAMUSCULAR | Status: DC | PRN

## 2022-07-19 MED ORDER — GLYCOPYRROLATE 0.2 MG/ML IJ SOLN: 0.2000 mg | INTRAMUSCULAR | Status: DC | PRN

## 2022-07-19 MED ORDER — POLYVINYL ALCOHOL 1.4 % OP SOLN
1.0000 [drp] | Freq: Four times a day (QID) | OPHTHALMIC | Status: DC | PRN
Start: 2022-07-19 — End: 2022-07-20

## 2022-07-19 MED ORDER — SODIUM CHLORIDE 0.9 % IV SOLN: 250.0000 mL | INTRAVENOUS | Status: DC | PRN

## 2022-07-19 MED ORDER — SODIUM CHLORIDE 0.9% FLUSH
3.0000 mL | Freq: Two times a day (BID) | INTRAVENOUS | Status: DC
  Administered 2022-07-19: 3 mL via INTRAVENOUS

## 2022-07-19 MED ORDER — FENTANYL 2500MCG IN NS 250ML (10MCG/ML) PREMIX INFUSION
0.0000 ug/h | INTRAVENOUS | Status: DC
  Administered 2022-07-19: 25 ug/h via INTRAVENOUS
  Filled 2022-07-19: qty 250

## 2022-07-19 MED ORDER — ONDANSETRON 4 MG PO TBDP: 4.0000 mg | ORAL_TABLET | Freq: Four times a day (QID) | ORAL | Status: DC | PRN

## 2022-07-19 MED ORDER — GLYCOPYRROLATE 1 MG PO TABS: 1.0000 mg | ORAL_TABLET | ORAL | Status: DC | PRN

## 2022-07-19 MED ORDER — FENTANYL CITRATE PF 50 MCG/ML IJ SOSY
25.0000 ug | PREFILLED_SYRINGE | INTRAMUSCULAR | Status: DC | PRN
  Administered 2022-07-19: 25 ug via INTRAVENOUS
  Filled 2022-07-19: qty 1

## 2022-07-19 MED ORDER — FENTANYL CITRATE PF 50 MCG/ML IJ SOSY: 25.0000 ug | PREFILLED_SYRINGE | INTRAMUSCULAR | Status: DC | PRN

## 2022-07-19 MED ORDER — LORAZEPAM 2 MG/ML IJ SOLN
0.5000 mg | INTRAMUSCULAR | Status: DC | PRN
  Administered 2022-07-19: 0.5 mg via INTRAVENOUS
  Filled 2022-07-19: qty 1

## 2022-07-19 MED ORDER — ALBUTEROL SULFATE (2.5 MG/3ML) 0.083% IN NEBU: 2.5000 mg | INHALATION_SOLUTION | RESPIRATORY_TRACT | Status: DC | PRN

## 2022-07-19 MED ORDER — FENTANYL BOLUS VIA INFUSION: 20.0000 ug | INTRAVENOUS | Status: DC | PRN

## 2022-08-01 ENCOUNTER — Encounter: Payer: Medicare PPO | Admitting: Adult Health

## 2022-08-12 NOTE — Progress Notes (Addendum)
Brief Summary:- 81 y.o. female with medical history significant of hypertension, bipolar disorder, HLD, hypothyroidism admitted with E. coli bacteremia sepsis in the setting of E. coli UTI compounded by concomitant pneumonia as well as worsening AKI , hyponatremia , recurrent hypoglycemia with significant acute metabolic encephalopathy and hemodynamic instability -On 29-Jul-2022--in the setting of worsening clinical condition and overall poor prognosis family requested that we transition to full comfort care measures. -Nephrologist and chaplain visited and had extensive conversation with family -Patient remains lethargic and only responsive to painful stimuli   A/p 1) E. coli bacteremia/sepsis -Urine and blood cultures from 07/20/2022 with mostly pansensitive E. Coli -Clinical picture continues to deteriorate despite appropriate antibiotic coverage with Rocephin 2 gm... And aggressive IV fluids per sepsis protocol -Patient also appears to have pneumonia and she was treated appropriately with Rocephin and azithromycin -On 07-29-2022 family requested transition to full comfort care.Marland KitchenMarland KitchenMarland KitchenPlease see discussion above in brief summary section  2)AKI--- most likely due to acute tubular necrosis in the setting of sepsis induced hypotension, compounded by NSAID and ARB use -PTA renal function was WNL -Family declines hemodialysis or CRRT -Renal function worsened with worsening creatinine and BUN, worsening acidosis and worsening metabolic encephalopathy.... With significant oliguria -Transitioned to full comfort care on July 29, 2022 at family request  3)Persistent hyponatremia and hypoglycemia----despite IV fluids and interventions -On 29-Jul-2022 transitioned to full comfort care at family request  4) acute metabolic encephalopathy--- due to #1 #2 #3 above -As of 07/29/2022 transitioned to full comfort care  5) social/ethics----multiple family members at bedside including patient's son, patient's daughter,  son-in-law, daughter-in-law, grandkids -Care conference at bedside on 07-29-2022 On 07/29/2022--in the setting of worsening clinical condition and overall poor prognosis family requested that we transition to full comfort care measures. -Remains DNR/DNI -Family will consider discharge home with hospice services at home if time allows  -- Total care time over 54 minutes  -Discussed with Dr. Gean Quint from nephrology service  Roxan Hockey, MD

## 2022-08-12 NOTE — Progress Notes (Signed)
Patient ID: [name] [name], female   DOB: 1941/08/21, 81 y.o.   MRN: [id number] S: No improvement in mentation. WBC increased to 29.6. Diminished urine output today, 495cc today. Daughter, son and rest of family at bedside. We had a lengthy discussion about next steps I.e. [facility] transfer especially given her mentation. Chaplain also present at bedside. As per daughter and son, they would like to hold off on transport and dialysis at this time given her pre-existing wishes. At this junction, leaning towards comfort care but are waiting to speak to the hospitalist again.  MAP has been <65 at least during my encounter. All questions from family answered. Discussed with staff.  O:BP (!) 102/44   Pulse 93   Temp 98.9 F (37.2 C) (Axillary)   Resp 14   Ht '4\' 8"'$  (1.422 m)   Wt 59.1 kg   LMP 02/10/1993   SpO2 98%   BMI 29.21 kg/m   Intake/Output Summary (Last 24 hours) at 07-22-22 0940 Last data filed at Jul 22, 2022 0600 Gross per 24 hour  Intake 2146.19 ml  Output 450 ml  Net 1696.19 ml   Intake/Output: I/O last 3 completed shifts: In: 3885.7 [I.V.:2491.1; IV Piggyback:1394.6] Out: 770 [Urine:770]  Intake/Output this shift:  No intake/output data recorded. Weight change:  HAL:PFXTKWIOXB ill-appearing CVS: RRR, no rub Resp: CTA Abd: +BS, soft, NT/ND Ext: trace edema b/l LEs Neuro: somnolent  Recent Labs  Lab  1014 07/17/22 0437 07/17/22 1311 07/18/22 0413 07/22/22 0454  NA 124* 123* 121* 123* 127*  K 4.0 4.4 4.8 4.3 4.2  CL 90* 95* 92* 96* 103  CO2 24 21* 20* 19* 17*  GLUCOSE 79 58* 120* 83 142*  BUN 47* 49* 51* 56* 55*  CREATININE 3.87* 4.15* 4.44* 4.67* 4.98*  ALBUMIN 2.6* 2.0*  --  2.8* 2.2*  CALCIUM 8.3* 7.5* 7.7* 7.6* 7.3*  PHOS  --   --   --  2.9 3.3  AST 28 20  --   --   --   ALT 25 21  --   --   --    Liver Function Tests: Recent Labs  Lab 08/05/2022 1014 07/17/22 0437 07/18/22 0413 07-22-2022 0454  AST 28 20  --   --   ALT 25 21  --   --    ALKPHOS 136* 132*  --   --   BILITOT 0.6 0.6  --   --   PROT 5.7* 4.8*  --   --   ALBUMIN 2.6* 2.0* 2.8* 2.2*   No results for input(s): "LIPASE", "AMYLASE" in the last 168 hours. No results for input(s): "AMMONIA" in the last 168 hours. CBC: Recent Labs  Lab 07/20/2022 1014 07/17/22 0437 07/18/22 0413 07/22/22 0454  WBC 28.9* 25.8* 24.2* 29.6*  NEUTROABS 27.7*  --   --   --   HGB 10.9* 9.6* 9.8* 9.0*  HCT 31.8* 28.7* 28.1* 27.0*  MCV 96.7 98.3 95.9 98.9  PLT 177 151 142* 139*   Cardiac Enzymes: No results for input(s): "CKTOTAL", "CKMB", "CKMBINDEX", "TROPONINI" in the last 168 hours. CBG: Recent Labs  Lab 07/18/22 1135 07/18/22 1331 07/18/22 1812 07/22/22 0030 07-22-22 0842  GLUCAP 69* 111* 105* 111* 126*    Iron Studies: No results for input(s): "IRON", "TIBC", "TRANSFERRIN", "FERRITIN" in the last 72 hours. Studies/Results: No results found.   aspirin EC  81 mg Oral Daily   atorvastatin  40 mg Oral Daily   Chlorhexidine Gluconate Cloth  6 each Topical Q0600  guaiFENesin  600 mg Oral BID   heparin  5,000 Units Subcutaneous Q8H   lamoTRIgine  150 mg Oral QHS   levothyroxine  25 mcg Oral Daily   pantoprazole (PROTONIX) IV  40 mg Intravenous Q24H   polyethylene glycol  17 g Oral Daily   risperiDONE  1 mg Oral QHS   saccharomyces boulardii  250 mg Oral BID    BMET    Component Value Date/Time   NA 127 (L) 2022/08/05 0454   NA 139 06/26/2022 1050   K 4.2 2022-08-05 0454   CL 103 August 05, 2022 0454   CO2 17 (L) 08/05/22 0454   GLUCOSE 142 (H) Aug 05, 2022 0454   BUN 55 (H) 08/05/22 0454   BUN 12 06/26/2022 1050   CREATININE 4.98 (H) 2022/08/05 0454   CREATININE 0.70 07/27/2013 1708   CALCIUM 7.3 (L) Aug 05, 2022 0454   GFRNONAA 8 (L) 2022-08-05 0454   GFRAA >60 07/12/2020 1032   CBC    Component Value Date/Time   WBC 29.6 (H) 08/05/2022 0454   RBC 2.73 (L) Aug 05, 2022 0454   HGB 9.0 (L) 08-05-22 0454   HGB 13.7 06/26/2022 1050   HCT 27.0 (L)  08/05/2022 0454   HCT 39.5 06/26/2022 1050   PLT 139 (L) August 05, 2022 0454   PLT 235 06/26/2022 1050   MCV 98.9 05-Aug-2022 0454   MCV 96 06/26/2022 1050   MCH 33.0 2022/08/05 0454   MCHC 33.3 2022-08-05 0454   RDW 15.8 (H) August 05, 2022 0454   RDW 12.0 06/26/2022 1050   LYMPHSABS 0.0 (L) 07/25/2022 1014   LYMPHSABS 1.4 06/29/2021 0922   MONOABS 0.6 08/03/2022 1014   EOSABS 0.6 (H) 08/10/2022 1014   EOSABS 0.3 06/29/2021 0922   BASOSABS 0.0 08/11/2022 1014   BASOSABS 0.1 06/29/2021 0922    Assessment/Plan:  AKI - non-oliguric.  Presumably ischemic ATN in setting of sepsis, hypotension, and concomitant ARB and NSAID use. Normal kidney function at baseline.  CT scan negative for hydronephrosis.  UOP improving with IVF's and had 845 overnight but not drinking on her own this morning.  Continue to hold ARB and NSAID.  No urgent indication for dialysis at this time. BP is too low for IHD at this time and if she doesn't start to show improvement, we may need to transfer to [facility] for CVVHD which is the case today. See above. After discussing with family at the bedside with the assistance of hospital chaplain, they are leaning towards hospice/comfort care and wish to hold off on transporting to [facility] and dialysis at this time which is reasonable. Avoid nephrotoxic medications including NSAIDs and iodinated intravenous contrast exposure unless the latter is absolutely indicated.  Preferred narcotic agents for pain control are hydromorphone, fentanyl, and methadone. Morphine should not be used.  Avoid Baclofen and avoid oral sodium phosphate and magnesium citrate based laxatives / bowel preps.  Continue strict Input and Output monitoring. Will monitor the patient closely with you and intervene or adjust therapy as indicated by changes in clinical status/labs  Renal dose meds for GFR<15 ml/min Keep MAP >65 to maintain optimal renal perfusion. Would recommend pressors but awaiting final [place] discussions Will send  serologies for completeness given hematuria and proteinuria on UA. ANA, ANCA, anti-GBM pending C/w supportive care: abx + IVF. Would have a low threshold on stopping fluids based on volume status Hyponatremia - presumably due to AKI and hypovolemia.  Improved to 127 today Sepsis - likely due to urinary source.  Blood cultures + for GNR and  likely E. Coli  (detected on blood cx ID panel along with enterobacterales).  Currently on IV ceftriaxone and azithromycin.  BP's dropping and may require pressors.  Urine cx with E. Coli resistant to amoxicillin only. Per primary. AMS - likely delirium from sepsis, uremia could also be contributing.  Doubt due to ceftriaxone.  Hypoglycemia - per primary, improved Right paratracheal prominence - seen on CXR will need CT scan in future. HTN - off meds due to hypotension at this time.    Gean Quint, MD [facility]

## 2022-08-12 NOTE — Plan of Care (Signed)
  Problem: Role Relationship: Goal: Family's ability to cope with current situation will improve Outcome: Progressing   Problem: Education: Goal: Knowledge of the prescribed therapeutic regimen will improve Outcome: Completed/Met   Problem: Respiratory: Goal: Verbalizations of increased ease of respirations will increase Outcome: Completed/Met   Problem: Pain Management: Goal: Satisfaction with pain management regimen will improve Outcome: Completed/Met   Problem: Coping: Goal: Ability to identify and develop effective coping behavior will improve Outcome: Not Applicable   Problem: Clinical Measurements: Goal: Quality of life will improve Outcome: Not Applicable   Problem: Role Relationship: Goal: Ability to verbalize concerns, feelings, and thoughts to partner or family member will improve Outcome: Not Applicable

## 2022-08-12 NOTE — Progress Notes (Signed)
The patient's respiratory rate was noted to be 0 bpm and HR via EKG was 0 beats per minute at this time.  This was verified by auscultation of heat tones for one minute and auscultation of all lung fields for one minute by two registered nurses independently - Lattie Cervi A. Enriqueta Shutter, RN and Birdie Riddle, RN.  The family was at the bedside and they were informed of the patient's expiration.  The on call physician was also made aware of the situation.   [place] and Patient placement has been contact (please see post-mortem flow sheet for details).

## 2022-08-12 NOTE — Progress Notes (Signed)
Pt continues to grimace intermittently, showing occasional signs of discomfort. Increase in respirations noted. Daughter and son at bedside, kept updated on patient's condition. Agreeable to increasing Fentanyl drip for pain management.

## 2022-08-12 NOTE — Consult Note (Signed)
Consultation Note Date: 14-Aug-2022   Patient Name: [name] [name]  DOB: [birth year]  MRN: [id number]  Age / Sex: 81 y.o., female  PCP: Renee Rival, FNP Referring Physician: Roxan Hockey, MD  Reason for Consultation:  goals of care  HPI/Patient Profile: 81 y.o. female  with past medical history of HTN, bipolar disorder, HLD, hypothyroid admitted on 08/07/2022 with sepsis due to E. Coli bacteremia resulting from E. Coli UTI and pneumonia. Recover has been complicated by worsening AKI, hypoglycemia, hyponatremia. After discussion with primary team family made decision to transition to full comfort measures only.    Primary Decision Maker NEXT OF KIN  Discussion: Chart reviewed. Update received from nurse.  Patient is unresponsive.  Met with family - 2 children- at bedside.  They note that primary goal is patient's comfort, no suffering.  There are no specific cultural or spiritual needs.  We discussed use of medications for comfort.      SUMMARY OF RECOMMENDATIONS -Full comfort measures only -Agree with fentanyl for symptoms- avoid morphine due to GFR <30 -I would not recommend discharge to outside facility- family would like patient to remain in hospital for the remainder of her time    Code Status/Advance Care Planning: DNR   Prognosis:   Hours - Days  Discharge Planning: Anticipated Hospital Death  Primary Diagnoses: Present on Admission:  Sepsis ([place])  Bipolar disorder ([place])  Hyperlipidemia  HTN (hypertension)  Pneumonia  UTI (urinary tract infection)  AKI (acute kidney injury) ([place])  Hyponatremia   Review of Systems  Physical Exam  Vital Signs: BP (!) 115/50   Pulse 95   Temp 98.9 F (37.2 C) (Axillary)   Resp 11   Ht $R'4\' 8"'iP$  (1.422 m)   Wt 59.1 kg   LMP 02/10/1993   SpO2 97%   BMI 29.21 kg/m  Pain Scale: Faces POSS *See Group Information*: 1-Acceptable,Awake  and alert Pain Score: Asleep   SpO2: SpO2: 97 % O2 Device:SpO2: 97 % O2 Flow Rate: .O2 Flow Rate (L/min): 3 L/min  IO: Intake/output summary:  Intake/Output Summary (Last 24 hours) at 14-Aug-2022 1157 Last data filed at 2022-08-14 0600 Gross per 24 hour  Intake 2146.19 ml  Output 450 ml  Net 1696.19 ml    LBM: Last BM Date : 07/17/22 Baseline Weight: Weight: 51.3 kg Most recent weight: Weight: 59.1 kg       Thank you for this consult. Palliative medicine will continue to follow and assist as needed.   Greater than 50%  of this time was spent counseling and coordinating care related to the above assessment and plan.  Signed by: Mariana Kaufman, AGNP-C Palliative Medicine    Please contact Palliative Medicine Team phone at [phone] for questions and concerns.  For individual provider: See Shea Evans

## 2022-08-12 NOTE — Discharge Summary (Signed)
81 y.o. female with medical history significant of hypertension, bipolar disorder, HLD, hypothyroidism admitted with E. coli bacteremia sepsis in the setting of E. coli UTI compounded by concomitant pneumonia as well as worsening AKI , hyponatremia , recurrent hypoglycemia with significant acute metabolic encephalopathy and hemodynamic instability -On August 08, 2022--in the setting of worsening clinical condition and overall poor prognosis family requested that we transition to full comfort care measures. -Nephrologist and chaplain visited and had extensive conversation with family -Patient expired on 2022/08/08 at 22:38 PM with family at bedside   Please see full progress note dated August 08, 2022   Roxan Hockey, MD

## 2022-08-12 NOTE — Death Summary Note (Signed)
DEATH SUMMARY   Patient Details  Name: [name] [name] MRN: [id number] DOB: 1940/11/26 TSV:[id number], Dewaine Conger, FNP Admission/Discharge Information   Admit Date:  08-15-22  Date of Death: Date of Death: 08-18-22  Time of Death: Time of Death: 02/22/2237  Length of Stay: 3   Principle Cause of death: E. coli bacteremia and sepsis  Hospital Diagnoses: Principal Problem:   Sepsis ([place]) Active Problems:   Hyperlipidemia   HTN (hypertension)   Bipolar disorder ([place])   Pneumonia   UTI (urinary tract infection)   AKI (acute kidney injury) ([place])   Hyponatremia   Hospital Course: 81 y.o. female with medical history significant of hypertension, bipolar disorder, HLD, hypothyroidism admitted with E. coli bacteremia sepsis in the setting of E. coli UTI compounded by concomitant pneumonia as well as worsening AKI , hyponatremia , recurrent hypoglycemia with significant acute metabolic encephalopathy and hemodynamic instability -On 18-Aug-2022--in the setting of worsening clinical condition and overall poor prognosis family requested that we transition to full comfort care measures. -Nephrologist and chaplain visited and had extensive conversation with family -Patient expired on 2022/08/18 at 22:38 PM with family at bedside  Assessment and Plan:  Please see full progress note dated 08/18/2022  The results of significant diagnostics from this hospitalization (including imaging, microbiology, ancillary and laboratory) are listed below for reference.   Significant Diagnostic Studies: CT RENAL STONE STUDY  Result Date: August 15, 2022 CLINICAL DATA:  Flank pain, abdominal pain EXAM: CT ABDOMEN AND PELVIS WITHOUT CONTRAST TECHNIQUE: Multidetector CT imaging of the abdomen and pelvis was performed following the standard protocol without IV contrast. RADIATION DOSE REDUCTION: This exam was performed according to the departmental dose-optimization program which includes automated exposure control, adjustment of  the mA and/or kV according to patient size and/or use of iterative reconstruction technique. COMPARISON:  07/12/2020 FINDINGS: Lower chest: There are linear patchy infiltrates in both lower lung fields, more so on the left side suggesting atelectasis/pneumonia. Minimal bilateral pleural effusions are seen. Coronary artery calcifications are seen. Hepatobiliary: No focal abnormalities are seen. There is no dilation of bile ducts. Gallbladder is distended. There is no wall thickening in gallbladder. Pancreas: Pancreas appears smaller than usual in size. No focal abnormalities are seen. Spleen: Unremarkable. Adrenals/Urinary Tract: Adrenals are unremarkable. There is no hydronephrosis. There are no renal or ureteral stones. Beam hardening artifacts from surgical hardware in the lumbar spine limited evaluation of the kidneys. Ureters are not dilated. Urinary bladder is not distended. Evaluation of bladder is limited by severe beam hardening artifact. Stomach/Bowel: Small hiatal hernia is seen. There is no significant small bowel dilation. Appendix is not seen. There is no significant wall thickening in colon. Diverticula are seen in the colon without signs of focal diverticulitis. Vascular/Lymphatic: Scattered arterial calcifications are seen. Reproductive: Evaluation is limited by severe beam hardening artifact. Uterus is not seen. Other: There is no ascites or pneumoperitoneum. There is mild stranding in the mesentery without any loculated fluid collections or significant lymphadenopathy. Musculoskeletal: There is previous arthroplasty in both hips. There is posterior fusion at the L4-L5 level in lumbar spine. Degenerative changes are noted in lower thoracic spine and lumbar spine. There is significant interval worsening of degenerative changes at T12-L1 level with erosive changes in the adjacent endplates. No definite loculated paraspinal fluid collections are seen. IMPRESSION: Beam hardening artifacts from surgical  hardware in lumbar spine and both hips limit the study. There is no hydronephrosis. There are no renal or ureteral stones. There is no evidence of intestinal obstruction  or pneumoperitoneum. There is significant interval worsening of degenerative changes at T12-L1 level. Possibility of discitis is not excluded. Please correlate with clinical symptoms and laboratory findings. If there is clinical suspicion for discitis, follow-up MRI may be considered. Small bilateral pleural effusions, more so on the left side. There are patchy infiltrates in both lower lung fields suggesting atelectasis/pneumonia. Small hiatal hernia. Diverticulosis of colon. There is mild stranding in the mesentery without any loculated fluid collections or significant lymphadenopathy. This may be a normal variation or suggest nonspecific mesenteric inflammation. Electronically Signed   By: Elmer Picker M.D.   On: 07/23/2022 13:32   DG Hips Bilat W or Wo Pelvis 3-4 Views  Result Date: 07/20/2022 CLINICAL DATA:  Fall. EXAM: DG HIP (WITH OR WITHOUT PELVIS) 3-4V BILAT COMPARISON:  October 11, 2021. FINDINGS: Status post bilateral total hip arthroplasties. Sacroiliac joints are unremarkable. No fracture or dislocation is noted. IMPRESSION: No acute abnormality is noted. Electronically Signed   By: Marijo Conception M.D.   On: 07/27/2022 11:44   DG Chest Port 1 View  Result Date: 07/31/2022 CLINICAL DATA:  Sepsis. EXAM: PORTABLE CHEST 1 VIEW COMPARISON:  April 14, 2020. FINDINGS: Normal cardiac size. Right paratracheal prominence is noted concerning for adenopathy or mass. Status post left shoulder arthroplasty. Severe degenerative changes seen involving the right shoulder. Lungs are clear. IMPRESSION: Right paratracheal prominence is concerning for mass or adenopathy. CT scan of the chest with intravenous contrast is recommended for further evaluation. Electronically Signed   By: Marijo Conception M.D.   On: 08/11/2022 11:43   CT Head Wo  Contrast  Result Date: 08/01/2022 CLINICAL DATA:  Increasing weakness.  Fall. EXAM: CT HEAD WITHOUT CONTRAST TECHNIQUE: Contiguous axial images were obtained from the base of the skull through the vertex without intravenous contrast. RADIATION DOSE REDUCTION: This exam was performed according to the departmental dose-optimization program which includes automated exposure control, adjustment of the mA and/or kV according to patient size and/or use of iterative reconstruction technique. COMPARISON:  04/17/2020 FINDINGS: Brain: There is no evidence for acute hemorrhage, hydrocephalus, mass lesion, or abnormal extra-axial fluid collection. No definite CT evidence for acute infarction. Diffuse loss of parenchymal volume is consistent with atrophy. Patchy low attenuation in the deep hemispheric and periventricular white matter is nonspecific, but likely reflects chronic microvascular ischemic demyelination. Vascular: No hyperdense vessel or unexpected calcification. Skull: Normal. Negative for fracture or focal lesion. Sinuses/Orbits: No acute finding. Other: None. IMPRESSION: 1. No acute intracranial abnormality. 2. Atrophy with chronic small vessel ischemic disease. Electronically Signed   By: Misty Stanley M.D.   On: 07/15/2022 11:19    Microbiology: Recent Results (from the past 240 hour(s))  Resp Panel by RT-PCR (Flu A&B, Covid) Anterior Nasal Swab     Status: None   Collection Time: 07/15/2022 10:14 AM   Specimen: Anterior Nasal Swab  Result Value Ref Range Status   SARS Coronavirus 2 by RT PCR NEGATIVE NEGATIVE Final    Comment: (NOTE) SARS-CoV-2 target nucleic acids are NOT DETECTED.  The SARS-CoV-2 RNA is generally detectable in upper respiratory specimens during the acute phase of infection. The lowest concentration of SARS-CoV-2 viral copies this assay can detect is 138 copies/mL. A negative result does not preclude SARS-Cov-2 infection and should not be used as the sole basis for treatment  or other patient management decisions. A negative result may occur with  improper specimen collection/handling, submission of specimen other than nasopharyngeal swab, presence of viral mutation(s) within the areas  targeted by this assay, and inadequate number of viral copies(<138 copies/mL). A negative result must be combined with clinical observations, patient history, and epidemiological information. The expected result is Negative.  Fact Sheet for Patients:  EntrepreneurPulse.com.au  Fact Sheet for Healthcare Providers:  IncredibleEmployment.be  This test is no t yet approved or cleared by the [place] FDA and  has been authorized for detection and/or diagnosis of SARS-CoV-2 by FDA under an Emergency Use Authorization (EUA). This EUA will remain  in effect (meaning this test can be used) for the duration of the COVID-19 declaration under Section 564(b)(1) of the Act, 21 U.S.C.section 360bbb-3(b)(1), unless the authorization is terminated  or revoked sooner.       Influenza A by PCR NEGATIVE NEGATIVE Final   Influenza B by PCR NEGATIVE NEGATIVE Final    Comment: (NOTE) The Xpert Xpress SARS-CoV-2/FLU/RSV plus assay is intended as an aid in the diagnosis of influenza from Nasopharyngeal swab specimens and should not be used as a sole basis for treatment. Nasal washings and aspirates are unacceptable for Xpert Xpress SARS-CoV-2/FLU/RSV testing.  Fact Sheet for Patients: EntrepreneurPulse.com.au  Fact Sheet for Healthcare Providers: IncredibleEmployment.be  This test is not yet approved or cleared by the [place] FDA and has been authorized for detection and/or diagnosis of SARS-CoV-2 by FDA under an Emergency Use Authorization (EUA). This EUA will remain in effect (meaning this test can be used) for the duration of the COVID-19 declaration under Section 564(b)(1) of the Act, 21 U.S.C. section  360bbb-3(b)(1), unless the authorization is terminated or revoked.  Performed at [facility], [address], [place], [place] [address]   Blood Culture (routine x 2)     Status: Abnormal   Collection Time: 07/15/2022 10:14 AM   Specimen: Right Antecubital; Blood  Result Value Ref Range Status   Specimen Description   Final    RIGHT ANTECUBITAL BOTTLES DRAWN AEROBIC AND ANAEROBIC Performed at [facility], [address] [place], [place] [address]    Special Requests   Final    Blood Culture adequate volume Performed at [facility], [address] [place],  [address]    Culture  Setup Time   Final    GRAM NEGATIVE RODS IN BOTH AEROBIC AND ANAEROBIC BOTTLES Gram Stain Report Called to,Read Back By and Verified With: R.ESTOCE @ 2222 BY MATTHEWS, B 9.4.2023 CRITICAL RESULT CALLED TO, READ BACK BY AND VERIFIED WITH: E TINAJERO,RN'@0215'$  07/17/22 [place] Performed at [facility], [place] [address] [place], [place] [address]    Culture ESCHERICHIA COLI (A)  Final   Report Status 07-27-2022 FINAL  Final   Organism ID, Bacteria ESCHERICHIA COLI  Final      Susceptibility   Escherichia coli - MIC*    AMPICILLIN >=32 RESISTANT Resistant     CEFAZOLIN <=4 SENSITIVE Sensitive     CEFEPIME <=0.12 SENSITIVE Sensitive     CEFTAZIDIME <=1 SENSITIVE Sensitive     CEFTRIAXONE <=0.25 SENSITIVE Sensitive     CIPROFLOXACIN <=0.25 SENSITIVE Sensitive     GENTAMICIN <=1 SENSITIVE Sensitive     IMIPENEM <=0.25 SENSITIVE Sensitive     TRIMETH/SULFA <=20 SENSITIVE Sensitive     AMPICILLIN/SULBACTAM 8 SENSITIVE Sensitive     PIP/TAZO <=4 SENSITIVE Sensitive     * ESCHERICHIA COLI  Urine Culture     Status: Abnormal   Collection Time: 08/11/2022 10:14 AM   Specimen: In/Out Cath Urine  Result Value Ref Range Status   Specimen Description   Final  IN/OUT CATH URINE Performed at [facility], [address], [place], [place] [address]    Special Requests   Final    NONE Performed at [facility], [address], [place], [place] [address]    Culture >=100,000 COLONIES/mL ESCHERICHIA COLI (A)  Final   Report Status 07/18/2022 FINAL  Final   Organism ID, Bacteria ESCHERICHIA COLI (A)  Final      Susceptibility   Escherichia coli - MIC*    AMPICILLIN >=32 RESISTANT Resistant     CEFAZOLIN <=4 SENSITIVE Sensitive     CEFEPIME <=0.12 SENSITIVE Sensitive     CEFTRIAXONE <=0.25 SENSITIVE Sensitive     CIPROFLOXACIN <=0.25 SENSITIVE Sensitive     GENTAMICIN <=1 SENSITIVE Sensitive     IMIPENEM <=0.25 SENSITIVE Sensitive     NITROFURANTOIN <=16 SENSITIVE Sensitive     TRIMETH/SULFA <=20 SENSITIVE Sensitive     AMPICILLIN/SULBACTAM 8 SENSITIVE Sensitive     PIP/TAZO <=4 SENSITIVE Sensitive     * >=100,000 COLONIES/mL ESCHERICHIA COLI  Blood Culture ID Panel (Reflexed)     Status: Abnormal   Collection Time: 07/27/2022 10:14 AM  Result Value Ref Range Status   Enterococcus faecalis NOT DETECTED NOT DETECTED Final   Enterococcus Faecium NOT DETECTED NOT DETECTED Final   Listeria monocytogenes NOT DETECTED NOT DETECTED Final   Staphylococcus species NOT DETECTED NOT DETECTED Final   Staphylococcus aureus (BCID) NOT DETECTED NOT DETECTED Final   Staphylococcus epidermidis NOT DETECTED NOT DETECTED Final   Staphylococcus lugdunensis NOT DETECTED NOT DETECTED Final   Streptococcus species NOT DETECTED NOT DETECTED Final   Streptococcus agalactiae NOT DETECTED NOT DETECTED Final   Streptococcus pneumoniae NOT DETECTED NOT DETECTED Final   Streptococcus pyogenes NOT DETECTED NOT DETECTED Final   A.calcoaceticus-baumannii NOT DETECTED NOT DETECTED Final   Bacteroides fragilis NOT DETECTED NOT DETECTED Final   Enterobacterales DETECTED (A) NOT DETECTED Final    Comment: Enterobacterales represent a large order of gram negative bacteria, not a single organism. CRITICAL RESULT CALLED TO, READ BACK BY AND VERIFIED WITH: E TINAJERO,RN'@0216'$  07/17/22 [place]    Enterobacter cloacae complex NOT  DETECTED NOT DETECTED Final   Escherichia coli DETECTED (A) NOT DETECTED Final    Comment: CRITICAL RESULT CALLED TO, READ BACK BY AND VERIFIED WITH: E TINAJERO,RN'@0216'$  07/17/22 [place]    Klebsiella aerogenes NOT DETECTED NOT DETECTED Final   Klebsiella oxytoca NOT DETECTED NOT DETECTED Final   Klebsiella pneumoniae NOT DETECTED NOT DETECTED Final   Proteus species NOT DETECTED NOT DETECTED Final   Salmonella species NOT DETECTED NOT DETECTED Final   Serratia marcescens NOT DETECTED NOT DETECTED Final   Haemophilus influenzae NOT DETECTED NOT DETECTED Final   Neisseria meningitidis NOT DETECTED NOT DETECTED Final   Pseudomonas aeruginosa NOT DETECTED NOT DETECTED Final   Stenotrophomonas maltophilia NOT DETECTED NOT DETECTED Final   Candida albicans NOT DETECTED NOT DETECTED Final   Candida auris NOT DETECTED NOT DETECTED Final   Candida glabrata NOT DETECTED NOT DETECTED Final   Candida krusei NOT DETECTED NOT DETECTED Final   Candida parapsilosis NOT DETECTED NOT DETECTED Final   Candida tropicalis NOT DETECTED NOT DETECTED Final   Cryptococcus neoformans/gattii NOT DETECTED NOT DETECTED Final   CTX-M ESBL NOT DETECTED NOT DETECTED Final   Carbapenem resistance IMP NOT DETECTED NOT DETECTED Final   Carbapenem resistance KPC NOT DETECTED NOT DETECTED Final   Carbapenem resistance NDM NOT DETECTED NOT DETECTED Final   Carbapenem resist OXA 48 LIKE NOT DETECTED NOT DETECTED Final  Carbapenem resistance VIM NOT DETECTED NOT DETECTED Final    Comment: Performed at [facility], [place] [address], [place], [place] [address]  Blood Culture (routine x 2)     Status: Abnormal   Collection Time: 07/14/2022 10:19 AM   Specimen: BLOOD RIGHT WRIST  Result Value Ref Range Status   Specimen Description   Final    BLOOD RIGHT WRIST BOTTLES DRAWN AEROBIC AND ANAEROBIC Performed at [facility], [address] [place], [place] [address]    Special Requests   Final    Blood Culture adequate  volume Performed at [facility], [address], [place], [place] [address]    Culture  Setup Time   Final    GRAM NEGATIVE RODS IN BOTH AEROBIC AND ANAEROBIC BOTTLES Gram Stain Report Called to,Read Back By and Verified With: R.ESTOCE'@2154'$  BY MATTHEWS, B 9.4.2023   Culture (A)  Final    ESCHERICHIA COLI SUSCEPTIBILITIES PERFORMED ON PREVIOUS CULTURE WITHIN THE LAST 5 DAYS. Performed at [facility], [place] [address] [place], [place] [address]    Report Status 08-02-22 FINAL  Final  MRSA Next Gen by PCR, Nasal     Status: None   Collection Time: 07/20/2022  4:51 PM   Specimen: Nasal Mucosa; Nasal Swab  Result Value Ref Range Status   MRSA by PCR Next Gen NOT DETECTED NOT DETECTED Final    Comment: (NOTE) The GeneXpert MRSA Assay (FDA approved for NASAL specimens only), is one component of a comprehensive MRSA colonization surveillance program. It is not intended to diagnose MRSA infection nor to guide or monitor treatment for MRSA infections. Test performance is not FDA approved in patients less than 78 years old. Performed at [facility], [address], [place], [place] [address]   Respiratory (~20 pathogens) panel by PCR     Status: None   Collection Time: 08/09/2022  5:12 PM   Specimen: Nasopharyngeal Swab; Respiratory  Result Value Ref Range Status   Adenovirus NOT DETECTED NOT DETECTED Final   Coronavirus 229E NOT DETECTED NOT DETECTED Final    Comment: (NOTE) The Coronavirus on the Respiratory Panel, DOES NOT test for the novel  Coronavirus (2019 nCoV)    Coronavirus HKU1 NOT DETECTED NOT DETECTED Final   Coronavirus NL63 NOT DETECTED NOT DETECTED Final   Coronavirus OC43 NOT DETECTED NOT DETECTED Final   Metapneumovirus NOT DETECTED NOT DETECTED Final   Rhinovirus / Enterovirus NOT DETECTED NOT DETECTED Final   Influenza A NOT DETECTED NOT DETECTED Final   Influenza B NOT DETECTED NOT DETECTED Final   Parainfluenza Virus 1 NOT DETECTED NOT DETECTED Final    Parainfluenza Virus 2 NOT DETECTED NOT DETECTED Final   Parainfluenza Virus 3 NOT DETECTED NOT DETECTED Final   Parainfluenza Virus 4 NOT DETECTED NOT DETECTED Final   Respiratory Syncytial Virus NOT DETECTED NOT DETECTED Final   Bordetella pertussis NOT DETECTED NOT DETECTED Final   Bordetella Parapertussis NOT DETECTED NOT DETECTED Final   Chlamydophila pneumoniae NOT DETECTED NOT DETECTED Final   Mycoplasma pneumoniae NOT DETECTED NOT DETECTED Final    Comment: Performed at [facility], [place] [address], [place], [place] [address]    Signed: Roxan Hockey, MD 02-Aug-2022

## 2022-08-12 NOTE — Progress Notes (Signed)
Pt showing signs of discomfort, informed MD. IV Fentanyl ordered and administered. Family educated and appreciative.

## 2022-08-12 DEATH — deceased

## 2022-12-27 ENCOUNTER — Encounter: Payer: Medicare PPO | Admitting: Nurse Practitioner
